# Patient Record
Sex: Female | Born: 1945
Health system: Southern US, Community
[De-identification: ages and names within clinical notes are randomized; demographics above are authoritative.]

## PROBLEM LIST (undated history)

## (undated) DIAGNOSIS — E069 Thyroiditis, unspecified: Secondary | ICD-10-CM

## (undated) DIAGNOSIS — E119 Type 2 diabetes mellitus without complications: Secondary | ICD-10-CM

## (undated) DIAGNOSIS — I10 Essential (primary) hypertension: Secondary | ICD-10-CM

## (undated) DIAGNOSIS — M1990 Unspecified osteoarthritis, unspecified site: Secondary | ICD-10-CM

## (undated) DIAGNOSIS — M109 Gout, unspecified: Secondary | ICD-10-CM

## (undated) DIAGNOSIS — I77810 Thoracic aortic ectasia: Secondary | ICD-10-CM

## (undated) DIAGNOSIS — K219 Gastro-esophageal reflux disease without esophagitis: Secondary | ICD-10-CM

## (undated) DIAGNOSIS — R011 Cardiac murmur, unspecified: Secondary | ICD-10-CM

## (undated) DIAGNOSIS — I351 Nonrheumatic aortic (valve) insufficiency: Secondary | ICD-10-CM

## (undated) DIAGNOSIS — I429 Cardiomyopathy, unspecified: Secondary | ICD-10-CM

## (undated) DIAGNOSIS — D126 Benign neoplasm of colon, unspecified: Secondary | ICD-10-CM

## (undated) DIAGNOSIS — I5032 Chronic diastolic (congestive) heart failure: Secondary | ICD-10-CM

## (undated) DIAGNOSIS — I2510 Atherosclerotic heart disease of native coronary artery without angina pectoris: Secondary | ICD-10-CM

## (undated) DIAGNOSIS — I6529 Occlusion and stenosis of unspecified carotid artery: Secondary | ICD-10-CM

## (undated) DIAGNOSIS — E7800 Pure hypercholesterolemia, unspecified: Secondary | ICD-10-CM

## (undated) DIAGNOSIS — Z951 Presence of aortocoronary bypass graft: Secondary | ICD-10-CM

## (undated) DIAGNOSIS — Z953 Presence of xenogenic heart valve: Secondary | ICD-10-CM

## (undated) HISTORY — DX: Thyroiditis, unspecified: E06.9

## (undated) HISTORY — DX: Pure hypercholesterolemia, unspecified: E78.00

## (undated) HISTORY — PX: CARDIAC VALVE REPLACEMENT: SHX585

## (undated) HISTORY — DX: Benign neoplasm of colon, unspecified: D12.6

## (undated) HISTORY — DX: Atherosclerotic heart disease of native coronary artery without angina pectoris: I25.10

## (undated) HISTORY — PX: COLONOSCOPY: SHX174

## (undated) HISTORY — PX: EYE SURGERY: SHX253

## (undated) HISTORY — DX: Essential (primary) hypertension: I10

## (undated) HISTORY — PX: CATARACT EXTRACTION: SUR2

## (undated) HISTORY — PX: CARPAL TUNNEL RELEASE: SHX101

## (undated) HISTORY — PX: ABDOMINAL HYSTERECTOMY: SHX81

## (undated) HISTORY — DX: Cardiomyopathy, unspecified: I42.9

## (undated) HISTORY — DX: Nonrheumatic aortic (valve) insufficiency: I35.1

## (undated) HISTORY — DX: Occlusion and stenosis of unspecified carotid artery: I65.29

## (undated) HISTORY — DX: Thoracic aortic ectasia: I77.810

## (undated) HISTORY — DX: Type 2 diabetes mellitus without complications: E11.9

## (undated) HISTORY — PX: CARDIAC CATHETERIZATION: SHX172

## (undated) HISTORY — PX: JOINT REPLACEMENT: SHX530

## (undated) HISTORY — DX: Chronic diastolic (congestive) heart failure: I50.32

## (undated) HISTORY — PX: BUNIONECTOMY: SHX129

## (undated) HISTORY — DX: Gout, unspecified: M10.9

---

## 1999-07-22 ENCOUNTER — Encounter: Admission: RE | Admit: 1999-07-22 | Discharge: 1999-07-22 | Payer: Self-pay | Admitting: Internal Medicine

## 1999-07-22 ENCOUNTER — Encounter: Payer: Self-pay | Admitting: Internal Medicine

## 1999-09-27 ENCOUNTER — Ambulatory Visit (HOSPITAL_COMMUNITY): Admission: RE | Admit: 1999-09-27 | Discharge: 1999-09-27 | Payer: Self-pay | Admitting: Gastroenterology

## 2000-03-13 ENCOUNTER — Encounter: Admission: RE | Admit: 2000-03-13 | Discharge: 2000-03-13 | Payer: Self-pay | Admitting: Internal Medicine

## 2000-03-13 ENCOUNTER — Encounter: Payer: Self-pay | Admitting: Internal Medicine

## 2000-09-22 ENCOUNTER — Encounter: Admission: RE | Admit: 2000-09-22 | Discharge: 2000-09-22 | Payer: Self-pay | Admitting: Internal Medicine

## 2000-09-22 ENCOUNTER — Encounter: Payer: Self-pay | Admitting: Internal Medicine

## 2000-09-24 ENCOUNTER — Encounter: Admission: RE | Admit: 2000-09-24 | Discharge: 2000-09-24 | Payer: Self-pay | Admitting: Internal Medicine

## 2000-09-24 ENCOUNTER — Encounter: Payer: Self-pay | Admitting: Internal Medicine

## 2001-12-29 ENCOUNTER — Encounter: Payer: Self-pay | Admitting: Internal Medicine

## 2001-12-29 ENCOUNTER — Encounter: Admission: RE | Admit: 2001-12-29 | Discharge: 2001-12-29 | Payer: Self-pay | Admitting: Internal Medicine

## 2003-01-26 ENCOUNTER — Encounter: Payer: Self-pay | Admitting: Internal Medicine

## 2003-01-26 ENCOUNTER — Encounter: Admission: RE | Admit: 2003-01-26 | Discharge: 2003-01-26 | Payer: Self-pay | Admitting: Internal Medicine

## 2003-07-20 ENCOUNTER — Encounter: Admission: RE | Admit: 2003-07-20 | Discharge: 2003-10-18 | Payer: Self-pay | Admitting: Internal Medicine

## 2004-02-13 ENCOUNTER — Encounter: Admission: RE | Admit: 2004-02-13 | Discharge: 2004-02-13 | Payer: Self-pay | Admitting: Geriatric Medicine

## 2004-02-22 ENCOUNTER — Observation Stay (HOSPITAL_COMMUNITY): Admission: EM | Admit: 2004-02-22 | Discharge: 2004-02-23 | Payer: Self-pay | Admitting: Emergency Medicine

## 2004-02-22 ENCOUNTER — Encounter (INDEPENDENT_AMBULATORY_CARE_PROVIDER_SITE_OTHER): Payer: Self-pay | Admitting: Specialist

## 2005-04-15 ENCOUNTER — Encounter: Admission: RE | Admit: 2005-04-15 | Discharge: 2005-04-15 | Payer: Self-pay | Admitting: Geriatric Medicine

## 2006-05-04 ENCOUNTER — Encounter: Admission: RE | Admit: 2006-05-04 | Discharge: 2006-05-04 | Payer: Self-pay | Admitting: Geriatric Medicine

## 2006-05-15 ENCOUNTER — Encounter: Admission: RE | Admit: 2006-05-15 | Discharge: 2006-05-15 | Payer: Self-pay | Admitting: Geriatric Medicine

## 2007-06-25 ENCOUNTER — Encounter: Admission: RE | Admit: 2007-06-25 | Discharge: 2007-06-25 | Payer: Self-pay | Admitting: Geriatric Medicine

## 2008-07-19 ENCOUNTER — Encounter: Admission: RE | Admit: 2008-07-19 | Discharge: 2008-07-19 | Payer: Self-pay | Admitting: Geriatric Medicine

## 2009-07-20 ENCOUNTER — Encounter: Admission: RE | Admit: 2009-07-20 | Discharge: 2009-07-20 | Payer: Self-pay | Admitting: Geriatric Medicine

## 2009-10-05 DIAGNOSIS — E069 Thyroiditis, unspecified: Secondary | ICD-10-CM

## 2009-10-05 HISTORY — DX: Thyroiditis, unspecified: E06.9

## 2009-10-31 ENCOUNTER — Encounter: Admission: RE | Admit: 2009-10-31 | Discharge: 2009-10-31 | Payer: Self-pay | Admitting: Geriatric Medicine

## 2009-12-31 HISTORY — PX: OTHER SURGICAL HISTORY: SHX169

## 2010-04-28 ENCOUNTER — Encounter: Payer: Self-pay | Admitting: Geriatric Medicine

## 2010-08-23 NOTE — Op Note (Signed)
[name] [name], PANNING NO.:  [id number]   MEDICAL RECORD NO.:  [id number]          PATIENT TYPE:  INP   LOCATION:  0107                         FACILITY:  [facility]   PHYSICIAN:  Currie Paris, M.D.DATE OF BIRTH:  02-28-1946   DATE OF PROCEDURE:  DATE OF DISCHARGE:                                 OPERATIVE REPORT   CHIEF COMPLAINT:  Groin pain.   HISTORY OF PRESENT ILLNESS:  [name] [name] has been in generally good health.  About 2-3 days ago, developed some discomfort in her right groin area.  She  first noticed it when she was leaning up against her dryer and had some  discomfort in the right groin area.  It had gotten worse over the last two  days.  She went to the doctor today because of it.  She has noticed that she  has developed a bulge in the area that has been exquisitely tender.  She has  never had this before.  She has never had a hernia before.  She had no  nausea or vomiting with this.  No fevers or chills.  No diarrhea or change  in her bowel habits.  She has never had a bulge in this area before.   PAST SURGICAL HISTORY:  She has had a hysterectomy as well as carpal tunnel.   MEDICATIONS:  Include colchicine and Metformin as well as Zocor.   ALLERGIES:  She has a questionable allergy to PENICILLIN from many years  ago.  Also an allergy to [facility].   SOCIAL HISTORY:  She neither smokes or drinks.   FAMILY HISTORY:  Unremarkable.   REVIEW OF SYSTEMS:  HEENT:  Negative.  CHEST:  No cough or shortness of  breath.  HEART:  No history of cardiac disease.  ABDOMEN:  Negative except  for HPI.  GU:  Negative.  ENDOCRINE:  She does have diabetes managed with  oral meds.   PHYSICAL EXAMINATION:  VITAL SIGNS:  Temp 100.8, pulse 110, blood pressure  121/71.  GENERAL:  The patient is alert and oriented and does not appear  unremarkable.  HEENT:  Head is normocephalic.  Eyes are nonicteric.  Pupils are equal,  round and regular.  EOMs are intact.  Pharynx  is normal.  Mucous membranes  are not dry.  NECK:  Supple.  No masses or thyromegaly.  LUNGS:  Normal respirations.  Clear to auscultation.  HEART:  Regular rhythm.  No murmurs, rubs or gallops.  Intact carotid,  femoral, and dorsalis pedis pulses.  ABDOMEN:  A well-healed Pfannenstiel scar.  It is soft and not distended.  It is not tender.  Bowel sounds are normal.  In the right groin area, above  the inguinal crease, is a fusiform, very tender mass consistent with an  incarcerated hernia.  It does not reduce.  I cannot be 100% certain that  this is not acute lymphadenitis, but its fusiform nature is more suggestive  of incarcerated hernia.  EXTREMITIES:  Good range of motion.  No edema noted.   IMPRESSION:  1.  Groin mass, probable incarcerated hernia.  2.  Diabetes.  3.  Gout.   PLAN:  Patient has been n.p.o., and I think we ought to go ahead with a  groin exploration with the idea that this is most likely an incarcerated  hernia.  I am concerned because her white count is 16,000 and a low-grade  fever.  I have discussed that with the patient.  She understands this may  not be a hernia and may be something else but at this point, exploration  seems to be the appropriate next step.  All questions have been answered.     Chri   CJS/MEDQ  D:  02/22/2004  T:  02/22/2004  Job:  [id number]

## 2010-08-23 NOTE — Op Note (Signed)
[name] [name], ION NO.:  [id number]   MEDICAL RECORD NO.:  [id number]          PATIENT TYPE:  INP   LOCATION:  0342                         FACILITY:  [facility]   PHYSICIAN:  Currie Paris, M.D.DATE OF BIRTH:  07/03/1945   DATE OF PROCEDURE:  02/22/2004  DATE OF DISCHARGE:  02/23/2004                                 OPERATIVE REPORT   PREOPERATIVE DIAGNOSES:  Incarcerated right inguinal hernia.   POSTOPERATIVE DIAGNOSES:  Right inguinal lymphadenitis with necrosis and  abscess.   OPERATION:  Right inguinal/groin exploration with removal of apparent  necrotic abscess lymph nodes.   SURGEON:  Currie Paris, M.D.   ANESTHESIA:  General.   HISTORY:  This patient is a 65 year old whose presented with a tender right  inguinal mass which was most consistent on physical with a hernia although  she had no other GI symptoms. She had an elevated white count and low grade  fever.  We elected to proceed to inguinal exploration.   DESCRIPTION OF PROCEDURE:  The patient was seen in the holding area and had  no further questions. She was taken to the operating room and after  satisfactory general anesthesia had been obtained, the abdomen and groin  were prepped and draped as a single sterile field. The mass itself was  visible and was running obliquely and parallel to the inguinal fold crease  of the leg but just above it.  I injected Marcaine over the area in the skin  as well as some subfascially at the anterior superior iliac spine to help  with the postoperative pain relief. A skin incision was made directly over  this fusiform swelling and there was edematous tissue noted.  I opened  Scarpa's and identified the external oblique aponeurosis which was cleaned  off. This was clearly presenting not out the superficial ring but inferior  and I initially thought this represented an incarcerated femoral hernia. As  I was dissecting around this fairly long piece  of formed area, it appeared  to be mainly fatty tissue and I encountered an abscess with creamy pus which  was cultured aerobically and anaerobically and sent for Gram stains as well.  I continued mobilizing this area up and there were several small vessels  which were clamped and tied but his was all in the same layer and seemed to  come up off of the femoral canal area and was separate from this.  As I  worked on it and began freeing it with the cautery and dividing around it,  it was a fairly well circumscribed area of inflammatory tissue which I  thought represented at least a couple of necrotic lymph nodes.  I went ahead  and excised all of this tissue so that we were left with nothing but clean  tissue.  I inspected the femoral canal area and there did not appear to be  any hernias. Everything at this point appeared to be dry.  I injected some  more Marcaine prior to closing.   Since we did have some frank infection, I elected not to close the subcu  but  simply close the skin with some staples with the plans to keep her on  postoperative antibiotics.   The patient tolerated the procedure well. There were no operative  complications.  All counts were correct.     Chri   CJS/MEDQ  D:  02/22/2004  T:  02/23/2004  Job:  [id number]   cc:   Hal T. Stoneking, M.D.  [address] [place], [place] [address]  Fax: [phone]

## 2010-08-27 ENCOUNTER — Other Ambulatory Visit: Payer: Self-pay | Admitting: Geriatric Medicine

## 2010-08-27 DIAGNOSIS — Z1231 Encounter for screening mammogram for malignant neoplasm of breast: Secondary | ICD-10-CM

## 2010-09-06 ENCOUNTER — Ambulatory Visit: Payer: Self-pay

## 2010-09-10 ENCOUNTER — Ambulatory Visit
Admission: RE | Admit: 2010-09-10 | Discharge: 2010-09-10 | Disposition: A | Discharge status: Home / Self Care | Payer: Medicare Other | Source: Ambulatory Visit | Attending: Geriatric Medicine | Admitting: Geriatric Medicine

## 2010-09-10 DIAGNOSIS — Z1231 Encounter for screening mammogram for malignant neoplasm of breast: Secondary | ICD-10-CM

## 2011-09-15 ENCOUNTER — Other Ambulatory Visit: Payer: Self-pay | Admitting: Geriatric Medicine

## 2011-09-15 DIAGNOSIS — Z1231 Encounter for screening mammogram for malignant neoplasm of breast: Secondary | ICD-10-CM

## 2011-10-01 ENCOUNTER — Ambulatory Visit
Admission: RE | Admit: 2011-10-01 | Discharge: 2011-10-01 | Disposition: A | Discharge status: Home / Self Care | Payer: Medicare Other | Source: Ambulatory Visit | Attending: Geriatric Medicine | Admitting: Geriatric Medicine

## 2011-10-01 DIAGNOSIS — Z1231 Encounter for screening mammogram for malignant neoplasm of breast: Secondary | ICD-10-CM

## 2012-11-05 ENCOUNTER — Other Ambulatory Visit: Payer: Self-pay

## 2012-11-05 DIAGNOSIS — Z1231 Encounter for screening mammogram for malignant neoplasm of breast: Secondary | ICD-10-CM

## 2012-11-24 ENCOUNTER — Ambulatory Visit
Admission: RE | Admit: 2012-11-24 | Discharge: 2012-11-24 | Disposition: A | Discharge status: Home / Self Care | Payer: 59 | Source: Ambulatory Visit

## 2012-11-24 DIAGNOSIS — Z1231 Encounter for screening mammogram for malignant neoplasm of breast: Secondary | ICD-10-CM

## 2013-03-30 ENCOUNTER — Encounter: Payer: Self-pay | Admitting: General Surgery

## 2013-03-30 DIAGNOSIS — I491 Atrial premature depolarization: Secondary | ICD-10-CM

## 2013-03-30 DIAGNOSIS — I351 Nonrheumatic aortic (valve) insufficiency: Secondary | ICD-10-CM

## 2013-03-30 DIAGNOSIS — I493 Ventricular premature depolarization: Secondary | ICD-10-CM

## 2013-03-30 DIAGNOSIS — I10 Essential (primary) hypertension: Secondary | ICD-10-CM

## 2013-04-13 ENCOUNTER — Encounter (INDEPENDENT_AMBULATORY_CARE_PROVIDER_SITE_OTHER): Payer: Self-pay

## 2013-04-13 ENCOUNTER — Encounter: Payer: Self-pay | Admitting: Cardiology

## 2013-04-13 ENCOUNTER — Ambulatory Visit (INDEPENDENT_AMBULATORY_CARE_PROVIDER_SITE_OTHER): Payer: Medicare Other | Admitting: Cardiology

## 2013-04-13 VITALS — BP 136/68 | HR 69 | Ht 61.0 in | Wt 176.4 lb

## 2013-04-13 DIAGNOSIS — I10 Essential (primary) hypertension: Secondary | ICD-10-CM

## 2013-04-13 DIAGNOSIS — I493 Ventricular premature depolarization: Secondary | ICD-10-CM

## 2013-04-13 DIAGNOSIS — I359 Nonrheumatic aortic valve disorder, unspecified: Secondary | ICD-10-CM

## 2013-04-13 DIAGNOSIS — I77810 Thoracic aortic ectasia: Secondary | ICD-10-CM

## 2013-04-13 DIAGNOSIS — I4949 Other premature depolarization: Secondary | ICD-10-CM

## 2013-04-13 DIAGNOSIS — I351 Nonrheumatic aortic (valve) insufficiency: Secondary | ICD-10-CM

## 2013-04-13 NOTE — Progress Notes (Signed)
[address], [place], [place]  [address] Phone: [phone] Fax:  [phone]  Date:  04/13/2013   ID:  Hetal, Proano 1945/05/28, MRN [id number]  PCP:  Mathews Argyle, MD  Cardiologist:  Fransico Him, MD     History of Present Illness: [name] [name] is a 68 y.o. female with a history of PVC's, dilated aortic root, moderate AR, HTN who presents today for followup.  She is doing well.  She denies any chest pain, LE edema, dizziness,  or syncope.  She walks about 50 minutes 3 times weekly and sometimes gets a little SOB but this is stable  She occasionally notices a skipped heart beat.     Wt Readings from Last 3 Encounters:  04/13/13 176 lb 6.4 oz (80.015 kg)  03/30/13 173 lb (78.472 kg)     Past Medical History  Diagnosis Date  . Gout   . Diabetes mellitus without complication   . Hypertension   . Hypercholesteremia     LDL goal < 100  . Thyroiditis 10/2009    lab and u/s-thyroid function normalized in 9/11  . History of echocardiogram 2014    Moderate AR, mild MR, grade II diastolic dysfunction,mildly dilated aorta   . Adenomatous colon polyp   . Aortic regurgitation   . Dilated aortic root     Current Outpatient Prescriptions  Medication Sig Dispense Refill  . aspirin 81 MG tablet Take 81 mg by mouth daily.      . Calcium Citrate-Vitamin D 315-250 MG-UNIT TABS Take 1 tablet by mouth daily.      . colchicine 0.6 MG tablet Take 0.6 mg by mouth daily.      Marland Kitchen diltiazem (DILACOR XR) 240 MG 24 hr capsule Take 240 mg by mouth daily.      . Glucosamine 500 MG CAPS Take 1 capsule by mouth daily.      . metFORMIN (GLUCOPHAGE) 500 MG tablet Take 500 mg by mouth daily with breakfast.      . Multiple Vitamin (MULTIVITAMIN) tablet Take 1 tablet by mouth daily.       No current facility-administered medications for this visit.    Allergies:    Allergies  Allergen Reactions  . Atenolol Shortness Of Breath    Edema and Headache  . Lipitor  [Atorvastatin]     Fatigue  . Penicillins     G Benzathine: Local injection rash  . Sulfa Antibiotics Rash    Social History:  The patient  reports that she has never smoked. She does not have any smokeless tobacco history on file. She reports that she does not drink alcohol or use illicit drugs.   Family History:  The patient's family history is not on file.   ROS:  Please see the history of present illness.      All other systems reviewed and negative.   PHYSICAL EXAM: VS:  BP 136/68  Pulse 69  Ht 5\' 1"  (1.549 m)  Wt 176 lb 6.4 oz (80.015 kg)  BMI 33.35 kg/m2 Well nourished, well developed, in no acute distress HEENT: normal Neck: no JVD Cardiac:  normal S1, S2; RRR; no murmur Lungs:  clear to auscultation bilaterally, no wheezing, rhonchi or rales Abd: soft, nontender, no hepatomegaly Ext: no edema Skin: warm and dry Neuro:  CNs 2-12 intact, no focal abnormalities noted  EKG:  NSR with LVH with QRS widening and PVC's and nonspecific T wave abnormality     ASSESSMENT AND PLAN:  1. PVC's  - continue diltiazem 2. HTN - well controlled  - continue Diltiazem 3. Dilated aortic root - she has repeat echo scheduled for 09/2013 4. Moderate AR  Followup with me in 6 months  Signed, Fransico Him, MD 04/13/2013 9:00 AM

## 2013-04-13 NOTE — Patient Instructions (Signed)
Your physician recommends that you continue on your current medications as directed. Please refer to the Current Medication list given to you today.  Your physician wants you to follow-up in: 6 Months with Dr Turner You will receive a reminder letter in the mail two months in advance. If you don't receive a letter, please call our office to schedule the follow-up appointment.  

## 2013-09-21 ENCOUNTER — Telehealth: Payer: Self-pay | Admitting: General Surgery

## 2013-09-21 ENCOUNTER — Ambulatory Visit
Admission: RE | Admit: 2013-09-21 | Discharge: 2013-09-21 | Disposition: A | Discharge status: Home / Self Care | Payer: Medicare Other | Source: Ambulatory Visit | Attending: Internal Medicine | Admitting: Internal Medicine

## 2013-09-21 ENCOUNTER — Other Ambulatory Visit: Payer: Self-pay | Admitting: Internal Medicine

## 2013-09-21 DIAGNOSIS — R059 Cough, unspecified: Secondary | ICD-10-CM

## 2013-09-21 DIAGNOSIS — R05 Cough: Secondary | ICD-10-CM

## 2013-09-21 NOTE — Telephone Encounter (Signed)
Pt was at Dr Frutoso Chase office at [place]. Pt complained of fatigue, irregular heart rate with PVCs, and SOB w/Exertion. No substancial weight gain per Bethena Roys. Pt had EKG done in Office and Dr Amedeo Kinsman thinks we should look at it.   BP 148/68 P 84   They will fax over EKG for Dr Radford Pax to review  Curt Bears return number is [phone]  Since Dr Radford Pax is not in office I will show EKG to DOD Dr Ron Parker to see if he thinks we need to do anything immediately with pt or if it can wait for Dr Landis Gandy review when she is in the office later this afternoon

## 2013-09-21 NOTE — Telephone Encounter (Signed)
Dr Ron Parker said it was ok to wait on Dr Radford Pax to look over when she arrives after reviewing EKG. Will forward to Dr Radford Pax and Pt EKG on cart for review.

## 2013-09-23 ENCOUNTER — Telehealth: Payer: Self-pay | Admitting: Cardiology

## 2013-09-23 NOTE — Telephone Encounter (Signed)
If she is having a lot of palpitations then we can get a 24 hour holter to assess PVC load

## 2013-09-23 NOTE — Telephone Encounter (Signed)
Asked to look at EKG from PCP office due to patient complaining of palpitations.  EKG shows NSR with occasional PVC with LVH with repolarization and old anterior MI.  Please forward to PCP

## 2013-09-23 NOTE — Telephone Encounter (Signed)
Notified of EKG results from Dr. Cathlean Sauer office.  She states she has very occ palpitations. One day last week her HR was 90 and that was why she went to see Dr. Felipa Eth.  She also states that she has been waking up during night sweating, hair wet.  She is scheduled for Echo next Tues 6/24.  Sent Dr. Felipa Eth EKG interruption  per Dr. Radford Pax. Will forward back to Dr. Radford Pax for comment as to her wearing monitor since palpitations are occasional.

## 2013-09-25 NOTE — Telephone Encounter (Signed)
No monitor needed at this time

## 2013-09-26 NOTE — Telephone Encounter (Signed)
lmtrc

## 2013-09-26 NOTE — Telephone Encounter (Signed)
Pt is aware.  

## 2013-09-27 ENCOUNTER — Other Ambulatory Visit: Payer: Self-pay | Admitting: General Surgery

## 2013-09-27 DIAGNOSIS — I77810 Thoracic aortic ectasia: Secondary | ICD-10-CM

## 2013-09-28 ENCOUNTER — Ambulatory Visit (HOSPITAL_COMMUNITY): Payer: Medicare Other | Attending: Cardiology | Admitting: Cardiology

## 2013-09-28 ENCOUNTER — Encounter: Payer: Self-pay | Admitting: *Deleted

## 2013-09-28 DIAGNOSIS — I77810 Thoracic aortic ectasia: Secondary | ICD-10-CM

## 2013-09-28 DIAGNOSIS — I359 Nonrheumatic aortic valve disorder, unspecified: Secondary | ICD-10-CM | POA: Insufficient documentation

## 2013-09-28 DIAGNOSIS — I351 Nonrheumatic aortic (valve) insufficiency: Secondary | ICD-10-CM

## 2013-09-28 DIAGNOSIS — I059 Rheumatic mitral valve disease, unspecified: Secondary | ICD-10-CM | POA: Insufficient documentation

## 2013-09-28 NOTE — Progress Notes (Signed)
Echo performed. 

## 2013-09-30 ENCOUNTER — Other Ambulatory Visit (INDEPENDENT_AMBULATORY_CARE_PROVIDER_SITE_OTHER): Payer: Medicare Other

## 2013-09-30 ENCOUNTER — Encounter: Payer: Self-pay | Admitting: General Surgery

## 2013-09-30 ENCOUNTER — Telehealth: Payer: Self-pay | Admitting: Cardiology

## 2013-09-30 ENCOUNTER — Other Ambulatory Visit: Payer: Self-pay | Admitting: General Surgery

## 2013-09-30 ENCOUNTER — Encounter (HOSPITAL_COMMUNITY): Payer: Self-pay | Admitting: Pharmacy Technician

## 2013-09-30 DIAGNOSIS — I359 Nonrheumatic aortic valve disorder, unspecified: Secondary | ICD-10-CM

## 2013-09-30 DIAGNOSIS — R931 Abnormal findings on diagnostic imaging of heart and coronary circulation: Secondary | ICD-10-CM

## 2013-09-30 DIAGNOSIS — R9389 Abnormal findings on diagnostic imaging of other specified body structures: Secondary | ICD-10-CM

## 2013-09-30 LAB — PROTIME-INR
INR: 1.2 ratio — ABNORMAL HIGH (ref 0.8–1.0)
Prothrombin Time: 12.8 s (ref 9.6–13.1)

## 2013-09-30 LAB — BASIC METABOLIC PANEL
BUN: 18 mg/dL (ref 6–23)
CO2: 28 mEq/L (ref 19–32)
Calcium: 9.6 mg/dL (ref 8.4–10.5)
Chloride: 104 mEq/L (ref 96–112)
Creatinine, Ser: 0.8 mg/dL (ref 0.4–1.2)
GFR: 78.68 mL/min (ref >60.00)
Glucose, Bld: 153 mg/dL — ABNORMAL HIGH (ref 70–99)
Potassium: 4.7 mEq/L (ref 3.5–5.1)
Sodium: 139 mEq/L (ref 135–145)

## 2013-09-30 LAB — CBC WITH DIFFERENTIAL/PLATELET
Basophils Absolute: 0 10*3/uL (ref 0.0–0.1)
Basophils Relative: 0.5 % (ref 0.0–3.0)
Eosinophils Absolute: 0.2 10*3/uL (ref 0.0–0.7)
Eosinophils Relative: 3.1 % (ref 0.0–5.0)
HCT: 41.3 % (ref 36.0–46.0)
Hemoglobin: 14.3 g/dL (ref 12.0–15.0)
Lymphocytes Relative: 43.6 % (ref 12.0–46.0)
Lymphs Abs: 3.3 10*3/uL (ref 0.7–4.0)
MCHC: 34.6 g/dL (ref 30.0–36.0)
MCV: 98.3 fl (ref 78.0–100.0)
Monocytes Absolute: 0.6 10*3/uL (ref 0.1–1.0)
Monocytes Relative: 8.2 % (ref 3.0–12.0)
Neutro Abs: 3.4 10*3/uL (ref 1.4–7.7)
Neutrophils Relative %: 44.6 % (ref 43.0–77.0)
Platelets: 172 10*3/uL (ref 150.0–400.0)
RBC: 4.2 Mil/uL (ref 3.87–5.11)
RDW: 12.6 % (ref 11.5–15.5)
WBC: 7.7 10*3/uL (ref 4.0–10.5)

## 2013-09-30 NOTE — Telephone Encounter (Signed)
To Dr Turner to advise 

## 2013-09-30 NOTE — Telephone Encounter (Signed)
New Message  Pt states that she walks every Sunday and wants to know if she should stop. Please assist

## 2013-09-30 NOTE — Telephone Encounter (Signed)
Ok to continue to walk

## 2013-10-03 ENCOUNTER — Encounter: Payer: Self-pay | Admitting: General Surgery

## 2013-10-03 NOTE — Telephone Encounter (Signed)
Pt is aware.  

## 2013-10-04 ENCOUNTER — Encounter (HOSPITAL_COMMUNITY): Payer: Self-pay

## 2013-10-04 ENCOUNTER — Ambulatory Visit (HOSPITAL_COMMUNITY)
Admission: RE | Admit: 2013-10-04 | Discharge: 2013-10-04 | Disposition: A | Discharge status: Home / Self Care | Payer: Medicare Other | Source: Ambulatory Visit | Attending: Cardiology | Admitting: Cardiology

## 2013-10-04 ENCOUNTER — Telehealth: Payer: Self-pay | Admitting: Cardiology

## 2013-10-04 ENCOUNTER — Encounter: Payer: Self-pay | Admitting: General Surgery

## 2013-10-04 ENCOUNTER — Other Ambulatory Visit: Payer: Self-pay | Admitting: Cardiology

## 2013-10-04 ENCOUNTER — Encounter (HOSPITAL_COMMUNITY)
Admission: RE | Disposition: A | Discharge status: Home / Self Care | Payer: Self-pay | Source: Ambulatory Visit | Attending: Cardiology

## 2013-10-04 DIAGNOSIS — I10 Essential (primary) hypertension: Secondary | ICD-10-CM | POA: Insufficient documentation

## 2013-10-04 DIAGNOSIS — I351 Nonrheumatic aortic (valve) insufficiency: Secondary | ICD-10-CM

## 2013-10-04 DIAGNOSIS — I359 Nonrheumatic aortic valve disorder, unspecified: Secondary | ICD-10-CM

## 2013-10-04 DIAGNOSIS — I379 Nonrheumatic pulmonary valve disorder, unspecified: Secondary | ICD-10-CM | POA: Insufficient documentation

## 2013-10-04 DIAGNOSIS — I079 Rheumatic tricuspid valve disease, unspecified: Secondary | ICD-10-CM | POA: Insufficient documentation

## 2013-10-04 DIAGNOSIS — M109 Gout, unspecified: Secondary | ICD-10-CM | POA: Insufficient documentation

## 2013-10-04 DIAGNOSIS — I77810 Thoracic aortic ectasia: Secondary | ICD-10-CM | POA: Insufficient documentation

## 2013-10-04 DIAGNOSIS — E7800 Pure hypercholesterolemia, unspecified: Secondary | ICD-10-CM | POA: Insufficient documentation

## 2013-10-04 DIAGNOSIS — Z7982 Long term (current) use of aspirin: Secondary | ICD-10-CM | POA: Insufficient documentation

## 2013-10-04 DIAGNOSIS — Z860100 Personal history of colon polyps, unspecified: Secondary | ICD-10-CM | POA: Insufficient documentation

## 2013-10-04 DIAGNOSIS — E119 Type 2 diabetes mellitus without complications: Secondary | ICD-10-CM | POA: Insufficient documentation

## 2013-10-04 DIAGNOSIS — I4949 Other premature depolarization: Secondary | ICD-10-CM | POA: Insufficient documentation

## 2013-10-04 DIAGNOSIS — I080 Rheumatic disorders of both mitral and aortic valves: Secondary | ICD-10-CM | POA: Insufficient documentation

## 2013-10-04 HISTORY — PX: TEE WITHOUT CARDIOVERSION: SHX5443

## 2013-10-04 LAB — GLUCOSE, CAPILLARY: Glucose-Capillary: 120 mg/dL — ABNORMAL HIGH (ref 70–99)

## 2013-10-04 SURGERY — ECHOCARDIOGRAM, TRANSESOPHAGEAL
Anesthesia: Moderate Sedation

## 2013-10-04 MED ORDER — CARVEDILOL 6.25 MG PO TABS: 6.2500 mg | ORAL_TABLET | Freq: Two times a day (BID) | ORAL | Status: DC

## 2013-10-04 MED ORDER — LIDOCAINE VISCOUS 2 % MT SOLN
OROMUCOSAL | Status: DC | PRN
  Administered 2013-10-04: 10 mL via OROMUCOSAL

## 2013-10-04 MED ORDER — LIDOCAINE VISCOUS 2 % MT SOLN
OROMUCOSAL | Status: AC
  Filled 2013-10-04: qty 15

## 2013-10-04 MED ORDER — MIDAZOLAM HCL 10 MG/2ML IJ SOLN
INTRAMUSCULAR | Status: DC | PRN
  Administered 2013-10-04: 1 mg via INTRAVENOUS
  Administered 2013-10-04: 2 mg via INTRAVENOUS

## 2013-10-04 MED ORDER — FENTANYL CITRATE 0.05 MG/ML IJ SOLN
INTRAMUSCULAR | Status: DC | PRN
  Administered 2013-10-04 (×2): 12.5 ug via INTRAVENOUS
  Administered 2013-10-04: 25 ug via INTRAVENOUS

## 2013-10-04 MED ORDER — RAMIPRIL 5 MG PO CAPS: 5.0000 mg | ORAL_CAPSULE | Freq: Every day | ORAL | Status: DC

## 2013-10-04 MED ORDER — SODIUM CHLORIDE 0.9 % IV SOLN
INTRAVENOUS | Status: DC
  Administered 2013-10-04: 250 mL via INTRAVENOUS

## 2013-10-04 MED ORDER — FENTANYL CITRATE 0.05 MG/ML IJ SOLN
INTRAMUSCULAR | Status: AC
  Filled 2013-10-04: qty 2

## 2013-10-04 MED ORDER — MIDAZOLAM HCL 5 MG/ML IJ SOLN
INTRAMUSCULAR | Status: AC
  Filled 2013-10-04: qty 1

## 2013-10-04 NOTE — CV Procedure (Addendum)
   PROCEDURE NOTE  Procedure:  Transesophageal echocardiogram Operator:  Fransico Him, MD Indications:  Aortic insufficiency Complications: None IV Meds: Versed 3mg , Fentanyl 23mcg IV, 5cc Viscous Lidocaine  Results: Moderately dilated LV with severely reduced LVF EF 25% Normal RV size and function Normal RA Mildly dilated LA Normal TV with trivial TR Normal MV with mild MR Normal PV with trivial PR Thickened AV leaflets with mild AS.  The AV leaflets do not completely coapt together and there is moderately severe to severe AR that is eccentric.   Normal interatrial septum with no evidence of flow by color flow doppler Dilated ascending aorta  The patient tolerated the procedure well with no complications and was transferred back to her room in stable condition.  Assessment: 1.  Moderate to severe AI with dilated ascending aorta 2.  Severe LV dysfunction most likely secondary to #1 as well as poorly controlled HTN 3.  HTN poorly controlled today 4.  Mild AS  Plan: 1.  Set up for right and left heart cath 2.  D/C Cardizem due to LV dysfunction 3.  Start Coreg 6.25mg  BID 4.  Start Ramipril 10mg  daily

## 2013-10-04 NOTE — Telephone Encounter (Signed)
Please set up a cardiac CT for heart morphology- I have already put in order.  She needs it done later this week.  Please set up patient for CVTS consult with Dr. Roxy Manns for next week - I have already talked to Dr. Roxy Manns

## 2013-10-04 NOTE — Telephone Encounter (Signed)
Made pt aware and went over pre instructions with pt.

## 2013-10-04 NOTE — Telephone Encounter (Signed)
Scheduled. For PT

## 2013-10-04 NOTE — Telephone Encounter (Signed)
Pt set up with Dr Copper tomorrow at 3:00 pm will call pt and make aware.

## 2013-10-04 NOTE — Telephone Encounter (Signed)
Patient had TEE showing severe LV dysfunction with moderate to severe AI.  Please set her up for right and left heart cath with Dr. Burt Knack

## 2013-10-04 NOTE — Discharge Instructions (Signed)
Conscious Sedation, Adult, Care After °Refer to this sheet in the next few weeks. These instructions provide you with information on caring for yourself after your procedure. Your health care provider may also give you more specific instructions. Your treatment has been planned according to current medical practices, but problems sometimes occur. Call your health care provider if you have any problems or questions after your procedure. °WHAT TO EXPECT AFTER THE PROCEDURE  °After your procedure: °· You may feel sleepy, clumsy, and have poor balance for several hours. °· Vomiting may occur if you eat too soon after the procedure. °HOME CARE INSTRUCTIONS °· Do not participate in any activities where you could become injured for at least 24 hours. Do not: °¨ Drive. °¨ Swim. °¨ Ride a bicycle. °¨ Operate heavy machinery. °¨ Cook. °¨ Use power tools. °¨ Climb ladders. °¨ Work from a high place. °· Do not make important decisions or sign legal documents until you are improved. °· If you vomit, drink water, juice, or soup when you can drink without vomiting. Make sure you have little or no nausea before eating solid foods. °· Only take over-the-counter or prescription medicines for pain, discomfort, or fever as directed by your health care provider. °· Make sure you and your family fully understand everything about the medicines given to you, including what side effects may occur. °· You should not drink alcohol, take sleeping pills, or take medicines that cause drowsiness for at least 24 hours. °· If you smoke, do not smoke without supervision. °· If you are feeling better, you may resume normal activities 24 hours after you were sedated. °· Keep all appointments with your health care provider. °SEEK MEDICAL CARE IF: °· Your skin is pale or bluish in color. °· You continue to feel nauseous or vomit. °· Your pain is getting worse and is not helped by medicine. °· You have bleeding or swelling. °· You are still sleepy or  feeling clumsy after 24 hours. °SEEK IMMEDIATE MEDICAL CARE IF: °· You develop a rash. °· You have difficulty breathing. °· You develop any type of allergic problem. °· You have a fever. °MAKE SURE YOU: °· Understand these instructions. °· Will watch your condition. °· Will get help right away if you are not doing well or get worse. °Document Released: 01/12/2013 Document Reviewed: 01/12/2013 °ExitCare® Patient Information ©2015 [employer]. This information is not intended to replace advice given to you by your health care provider. Make sure you discuss any questions you have with your health care provider. °Transesophageal Echocardiogram °Transesophageal echocardiography (TEE) is a picture test of your heart using sound waves. The pictures taken can give very detailed pictures of your heart. This can help your doctor see if there are problems with your heart. TEE can check: °· If your heart has blood clots in it. °· How well your heart valves are working. °· If you have an infection on the inside of your heart. °· Some of the major arteries of your heart. °· If your heart valve is working after a repair. °· Your heart before a procedure that uses a shock to your heart to get the rhythm back to normal. °BEFORE THE PROCEDURE °· Do not eat or drink for 6 hours before the procedure or as told by your doctor. °· Make plans to have someone drive you home after the procedure. Do not drive yourself home. °· An IV tube will be put in your arm. °PROCEDURE °· You will be given a   medicine to help you relax (sedative). It will be given through the IV tube. °· A numbing medicine will be sprayed or gargled in the back of your throat to help numb it. °· The tip of the probe is placed into the back of your mouth. You will be asked to swallow. This helps to pass the probe into your esophagus. °· Once the tip of the probe is in the right place, your doctor can take pictures of your heart. °· You may feel pressure at the back of  your throat. °AFTER THE PROCEDURE °· You will be taken to a recovery area so the sedative can wear off. °· Your throat may be sore and scratchy. This will go away slowly over time. °· You will go home when you are fully awake and able to swallow liquids. °· You should have someone stay with you for the next 24 hours. °· Do not drive or operate machinery for the next 24 hours. °Document Released: 01/19/2009 Document Revised: 03/29/2013 Document Reviewed: 09/23/2012 °Exit[employer]® Patient Information ©2015 [employer]. This information is not intended to replace advice given to you by your health care provider. Make sure you discuss any questions you have with your health care provider. ° °

## 2013-10-04 NOTE — Progress Notes (Signed)
Echocardiogram Echocardiogram Transesophageal has been performed.  Joelene Millin 10/04/2013, 11:07 AM

## 2013-10-04 NOTE — H&P (Signed)
Admit date: 10/04/2013 Primary Cardiologist:  Fransico Him, MD Chief complaint/reason for admission: Moderate AR with reduced LVF and mild AS and enlarged ascending aorta  HPI: [name] [name] is a 68 y.o. female with a history of PVC's, dilated aortic root, moderate AR, HTN who presents today for TEE. She is doing well. She denies any chest pain, LE edema, dizziness, or syncope. She walks about 50 minutes 3 times weekly and sometimes gets a little SOB but this is stable She occasionally notices a skipped heart beat. She recently had an echo done for followup of her AR and was found to have moderately reduced LVF with mild AS and moderate AR, mildly dilated ascending aorta. Since last echo her LVF has declined. She is now here for a TEE to evaluate her AR to determine if moderate or severe   PMH:    Past Medical History  Diagnosis Date  . Gout   . Diabetes mellitus without complication   . Hypertension   . Hypercholesteremia     LDL goal < 100  . Thyroiditis 10/2009    lab and u/s-thyroid function normalized in 9/11  . History of echocardiogram 2014    Moderate AR, mild MR, grade II diastolic dysfunction,mildly dilated aorta   . Adenomatous colon polyp   . Aortic regurgitation   . Dilated aortic root     PSH:    Past Surgical History  Procedure Laterality Date  . Tubular adenomatous polyp colonoscopy  12/31/2009    ALLERGIES:   Atenolol; Penicillins; and Sulfa antibiotics  Prior to Admit Meds:   Prescriptions prior to admission  Medication Sig Dispense Refill  . aspirin EC 81 MG tablet Take 81 mg by mouth daily.      . Calcium-Vitamin D (CALTRATE 600 PLUS-VIT D PO) Take 1 tablet by mouth daily.      . colchicine (COLCRYS) 0.6 MG tablet Take 0.6 mg by mouth daily.      Marland Kitchen diltiazem (CARDIZEM CD) 240 MG 24 hr capsule Take 240 mg by mouth daily.      Marland Kitchen GLUCOSAMINE-CHONDROITIN PO Take 1 tablet by mouth daily. Glucosamine 1200 mg, chondroitin 1500 mg      . indomethacin (INDOCIN) 25  MG capsule Take 25 mg by mouth daily as needed (knee pain).      . metFORMIN (GLUCOPHAGE-XR) 500 MG 24 hr tablet Take 500 mg by mouth daily with breakfast.      . Multiple Vitamin (MULTIVITAMIN WITH MINERALS) TABS tablet Take 1 tablet by mouth daily. Centrum Silver      . naproxen sodium (ALEVE) 220 MG tablet Take 440 mg by mouth daily as needed (knee pain).      . Polyvinyl Alcohol-Povidone (REFRESH OP) Place 1 drop into both eyes daily as needed (dry eyes).       Family HX:   History reviewed. No pertinent family history. Social HX:    History   Social History  . Marital Status: Married    Spouse Name: N/A    Number of Children: N/A  . Years of Education: N/A   Occupational History  . Not on file.   Social History Main Topics  . Smoking status: Never Smoker   . Smokeless tobacco: Not on file  . Alcohol Use: No  . Drug Use: No  . Sexual Activity: Not on file   Other Topics Concern  . Not on file   Social History Narrative  . No narrative on file     ROS:  All 11 ROS were addressed and are negative except what is stated in the HPI  PHYSICAL EXAM Filed Vitals:   10/04/13 0947  BP: 161/54  Pulse: 73  Temp: 98.4 F (36.9 C)  Resp: 20   General: Well developed, well nourished, in no acute distress Head: Eyes PERRLA, No xanthomas.   Normal cephalic and atramatic  Lungs:   Clear bilaterally to auscultation and percussion. Heart:   HRRR S1 S2 Pulses are 2+ & equal.            No carotid bruit. No JVD.  No abdominal bruits. No femoral bruits. Abdomen: Bowel sounds are positive, abdomen soft and non-tender without masses Extremities:   No clubbing, cyanosis or edema.  DP +1 Neuro: Alert and oriented X 3. Psych:  Good affect, responds appropriately   Labs:   Lab Results  Component Value Date   WBC 7.7 09/30/2013   HGB 14.3 09/30/2013   HCT 41.3 09/30/2013   MCV 98.3 09/30/2013   PLT 172.0 09/30/2013    Recent Labs Lab 09/30/13 1125  NA 139  K 4.7  CL 104  CO2  28  BUN 18  CREATININE 0.8  CALCIUM 9.6  GLUCOSE 153*   No results found for this basename: CKTOTAL, CKMB, CKMBINDEX, TROPONINI   No results found for this basename: PTT   Lab Results  Component Value Date   INR 1.2* 09/30/2013     No results found for this basename: CHOL   No results found for this basename: HDL   No results found for this basename: LDLCALC   No results found for this basename: TRIG   No results found for this basename: CHOLHDL   No results found for this basename: LDLDIRECT      Radiology:  No results found.   ASSESSMENT AND PLAN:  1. PVC's - continue diltiazem  2. HTN - well controlled - continue Diltiazem  3. Dilated aortic root by echo. 4. Moderate AR 5. Mild AS 6. Moderately reduced LVF possibly secondary to AR - TEE today to assess severity of AR   Sueanne Margarita, MD  10/04/2013  10:11 AM

## 2013-10-04 NOTE — Interval H&P Note (Signed)
History and Physical Interval Note:  10/04/2013 10:15 AM  [name] [name]  has presented today for surgery, with the diagnosis of abnormal echocardiogram  The various methods of treatment have been discussed with the patient and family. After consideration of risks, benefits and other options for treatment, the patient has consented to  Procedure(s): TRANSESOPHAGEAL ECHOCARDIOGRAM (TEE) (N/A) as a surgical intervention .  The patient's history has been reviewed, patient examined, no change in status, stable for surgery.  I have reviewed the patient's chart and labs.  Questions were answered to the patient's satisfaction.     [name] R

## 2013-10-05 ENCOUNTER — Telehealth: Payer: Self-pay | Admitting: Cardiology

## 2013-10-05 ENCOUNTER — Encounter: Payer: Self-pay | Admitting: General Surgery

## 2013-10-05 ENCOUNTER — Encounter (HOSPITAL_COMMUNITY): Payer: Self-pay | Admitting: Cardiology

## 2013-10-05 ENCOUNTER — Ambulatory Visit (HOSPITAL_COMMUNITY)
Admission: RE | Admit: 2013-10-05 | Discharge: 2013-10-05 | Disposition: A | Discharge status: Home / Self Care | Payer: Medicare Other | Source: Ambulatory Visit | Attending: Cardiovascular Disease | Admitting: Cardiovascular Disease

## 2013-10-05 ENCOUNTER — Encounter (HOSPITAL_COMMUNITY)
Admission: RE | Disposition: A | Discharge status: Home / Self Care | Payer: Self-pay | Source: Ambulatory Visit | Attending: Cardiovascular Disease

## 2013-10-05 ENCOUNTER — Other Ambulatory Visit: Payer: Self-pay | Admitting: General Surgery

## 2013-10-05 DIAGNOSIS — M109 Gout, unspecified: Secondary | ICD-10-CM | POA: Insufficient documentation

## 2013-10-05 DIAGNOSIS — I351 Nonrheumatic aortic (valve) insufficiency: Secondary | ICD-10-CM

## 2013-10-05 DIAGNOSIS — I428 Other cardiomyopathies: Secondary | ICD-10-CM | POA: Insufficient documentation

## 2013-10-05 DIAGNOSIS — I359 Nonrheumatic aortic valve disorder, unspecified: Secondary | ICD-10-CM | POA: Insufficient documentation

## 2013-10-05 DIAGNOSIS — Z860100 Personal history of colon polyps, unspecified: Secondary | ICD-10-CM | POA: Insufficient documentation

## 2013-10-05 DIAGNOSIS — I2584 Coronary atherosclerosis due to calcified coronary lesion: Secondary | ICD-10-CM | POA: Insufficient documentation

## 2013-10-05 DIAGNOSIS — I2510 Atherosclerotic heart disease of native coronary artery without angina pectoris: Secondary | ICD-10-CM

## 2013-10-05 DIAGNOSIS — I10 Essential (primary) hypertension: Secondary | ICD-10-CM | POA: Insufficient documentation

## 2013-10-05 DIAGNOSIS — E119 Type 2 diabetes mellitus without complications: Secondary | ICD-10-CM | POA: Insufficient documentation

## 2013-10-05 DIAGNOSIS — I4949 Other premature depolarization: Secondary | ICD-10-CM | POA: Insufficient documentation

## 2013-10-05 DIAGNOSIS — E7800 Pure hypercholesterolemia, unspecified: Secondary | ICD-10-CM | POA: Insufficient documentation

## 2013-10-05 DIAGNOSIS — Z7982 Long term (current) use of aspirin: Secondary | ICD-10-CM | POA: Insufficient documentation

## 2013-10-05 HISTORY — DX: Atherosclerotic heart disease of native coronary artery without angina pectoris: I25.10

## 2013-10-05 HISTORY — PX: LEFT AND RIGHT HEART CATHETERIZATION WITH CORONARY ANGIOGRAM: SHX5449

## 2013-10-05 LAB — POCT I-STAT 3, ART BLOOD GAS (G3+)
Acid-base deficit: 1 mmol/L (ref 0.0–2.0)
Acid-base deficit: 1 mmol/L (ref 0.0–2.0)
Bicarbonate: 23.5 mEq/L (ref 20.0–24.0)
Bicarbonate: 24.1 mEq/L — ABNORMAL HIGH (ref 20.0–24.0)
O2 Saturation: 70 %
O2 Saturation: 92 %
TCO2: 25 mmol/L (ref 0–100)
TCO2: 25 mmol/L (ref 0–100)
pCO2 arterial: 39.2 mmHg (ref 35.0–45.0)
pCO2 arterial: 39.5 mmHg (ref 35.0–45.0)
pH, Arterial: 7.382 (ref 7.350–7.450)
pH, Arterial: 7.397 (ref 7.350–7.450)
pO2, Arterial: 37 mmHg — CL (ref 80.0–100.0)
pO2, Arterial: 65 mmHg — ABNORMAL LOW (ref 80.0–100.0)

## 2013-10-05 LAB — GLUCOSE, CAPILLARY: Glucose-Capillary: 81 mg/dL (ref 70–99)

## 2013-10-05 SURGERY — LEFT AND RIGHT HEART CATHETERIZATION WITH CORONARY ANGIOGRAM
Anesthesia: LOCAL

## 2013-10-05 MED ORDER — SODIUM CHLORIDE 0.9 % IJ SOLN: 3.0000 mL | Freq: Two times a day (BID) | INTRAMUSCULAR | Status: DC

## 2013-10-05 MED ORDER — SODIUM CHLORIDE 0.9 % IV SOLN: 1.0000 mL/kg/h | INTRAVENOUS | Status: DC

## 2013-10-05 MED ORDER — ONDANSETRON HCL 4 MG/2ML IJ SOLN: 4.0000 mg | Freq: Four times a day (QID) | INTRAMUSCULAR | Status: DC | PRN

## 2013-10-05 MED ORDER — SODIUM CHLORIDE 0.9 % IV SOLN
INTRAVENOUS | Status: DC
  Administered 2013-10-05: 14:00:00 via INTRAVENOUS

## 2013-10-05 MED ORDER — ACETAMINOPHEN 325 MG PO TABS: 650.0000 mg | ORAL_TABLET | ORAL | Status: DC | PRN

## 2013-10-05 MED ORDER — ASPIRIN 81 MG PO CHEW
81.0000 mg | CHEWABLE_TABLET | ORAL | Status: AC
  Administered 2013-10-05: 81 mg via ORAL

## 2013-10-05 MED ORDER — ASPIRIN 81 MG PO CHEW
CHEWABLE_TABLET | ORAL | Status: AC
  Filled 2013-10-05: qty 1

## 2013-10-05 MED ORDER — SODIUM CHLORIDE 0.9 % IV SOLN: 250.0000 mL | INTRAVENOUS | Status: DC | PRN

## 2013-10-05 MED ORDER — SODIUM CHLORIDE 0.9 % IJ SOLN: 3.0000 mL | INTRAMUSCULAR | Status: DC | PRN

## 2013-10-05 NOTE — Progress Notes (Signed)
Site area: rt brachial venous sheath Site Prior to Removal:  Level 0  Pressure Applied For 10  MINUTES    Manual:   Yes.    Patient Status During Pull:  Stable   Post Pull brachial Site:  Level 0  Post Pull Instructions Given:  Yes  Post Pull Pulses Present:  Yes.    Dressing Applied:  Yes.     Bedrest begins 1805  Comments rt brachial venous sheath pulled by Moishe Spice

## 2013-10-05 NOTE — Interval H&P Note (Signed)
History and Physical Interval Note:  10/05/2013 4:40 PM  [name] [name]  has presented today for surgery, with the diagnosis of aortic insufficienicy  The various methods of treatment have been discussed with the patient and family. After consideration of risks, benefits and other options for treatment, the patient has consented to  Procedure(s): LEFT AND RIGHT HEART CATHETERIZATION WITH CORONARY ANGIOGRAM (N/A) as a surgical intervention .  The patient's history has been reviewed, patient examined, no change in status, stable for surgery.  I have reviewed the patient's chart and labs.  Questions were answered to the patient's satisfaction.     Sherren Mocha

## 2013-10-05 NOTE — H&P (View-Only) (Signed)
Admit date: 10/04/2013 Primary Cardiologist:  Fransico Him, MD Chief complaint/reason for admission: Moderate AR with reduced LVF and mild AS and enlarged ascending aorta  HPI: [name] [name] is a 68 y.o. female with a history of PVC's, dilated aortic root, moderate AR, HTN who presents today for TEE. She is doing well. She denies any chest pain, LE edema, dizziness, or syncope. She walks about 50 minutes 3 times weekly and sometimes gets a little SOB but this is stable She occasionally notices a skipped heart beat. She recently had an echo done for followup of her AR and was found to have moderately reduced LVF with mild AS and moderate AR, mildly dilated ascending aorta. Since last echo her LVF has declined. She is now here for a TEE to evaluate her AR to determine if moderate or severe   PMH:    Past Medical History  Diagnosis Date  . Gout   . Diabetes mellitus without complication   . Hypertension   . Hypercholesteremia     LDL goal < 100  . Thyroiditis 10/2009    lab and u/s-thyroid function normalized in 9/11  . History of echocardiogram 2014    Moderate AR, mild MR, grade II diastolic dysfunction,mildly dilated aorta   . Adenomatous colon polyp   . Aortic regurgitation   . Dilated aortic root     PSH:    Past Surgical History  Procedure Laterality Date  . Tubular adenomatous polyp colonoscopy  12/31/2009    ALLERGIES:   Atenolol; Penicillins; and Sulfa antibiotics  Prior to Admit Meds:   Prescriptions prior to admission  Medication Sig Dispense Refill  . aspirin EC 81 MG tablet Take 81 mg by mouth daily.      . Calcium-Vitamin D (CALTRATE 600 PLUS-VIT D PO) Take 1 tablet by mouth daily.      . colchicine (COLCRYS) 0.6 MG tablet Take 0.6 mg by mouth daily.      Marland Kitchen diltiazem (CARDIZEM CD) 240 MG 24 hr capsule Take 240 mg by mouth daily.      Marland Kitchen GLUCOSAMINE-CHONDROITIN PO Take 1 tablet by mouth daily. Glucosamine 1200 mg, chondroitin 1500 mg      . indomethacin (INDOCIN) 25  MG capsule Take 25 mg by mouth daily as needed (knee pain).      . metFORMIN (GLUCOPHAGE-XR) 500 MG 24 hr tablet Take 500 mg by mouth daily with breakfast.      . Multiple Vitamin (MULTIVITAMIN WITH MINERALS) TABS tablet Take 1 tablet by mouth daily. Centrum Silver      . naproxen sodium (ALEVE) 220 MG tablet Take 440 mg by mouth daily as needed (knee pain).      . Polyvinyl Alcohol-Povidone (REFRESH OP) Place 1 drop into both eyes daily as needed (dry eyes).       Family HX:   History reviewed. No pertinent family history. Social HX:    History   Social History  . Marital Status: Married    Spouse Name: N/A    Number of Children: N/A  . Years of Education: N/A   Occupational History  . Not on file.   Social History Main Topics  . Smoking status: Never Smoker   . Smokeless tobacco: Not on file  . Alcohol Use: No  . Drug Use: No  . Sexual Activity: Not on file   Other Topics Concern  . Not on file   Social History Narrative  . No narrative on file     ROS:  All 11 ROS were addressed and are negative except what is stated in the HPI  PHYSICAL EXAM Filed Vitals:   10/04/13 0947  BP: 161/54  Pulse: 73  Temp: 98.4 F (36.9 C)  Resp: 20   General: Well developed, well nourished, in no acute distress Head: Eyes PERRLA, No xanthomas.   Normal cephalic and atramatic  Lungs:   Clear bilaterally to auscultation and percussion. Heart:   HRRR S1 S2 Pulses are 2+ & equal.            No carotid bruit. No JVD.  No abdominal bruits. No femoral bruits. Abdomen: Bowel sounds are positive, abdomen soft and non-tender without masses Extremities:   No clubbing, cyanosis or edema.  DP +1 Neuro: Alert and oriented X 3. Psych:  Good affect, responds appropriately   Labs:   Lab Results  Component Value Date   WBC 7.7 09/30/2013   HGB 14.3 09/30/2013   HCT 41.3 09/30/2013   MCV 98.3 09/30/2013   PLT 172.0 09/30/2013    Recent Labs Lab 09/30/13 1125  NA 139  K 4.7  CL 104  CO2  28  BUN 18  CREATININE 0.8  CALCIUM 9.6  GLUCOSE 153*   No results found for this basename: CKTOTAL, CKMB, CKMBINDEX, TROPONINI   No results found for this basename: PTT   Lab Results  Component Value Date   INR 1.2* 09/30/2013     No results found for this basename: CHOL   No results found for this basename: HDL   No results found for this basename: LDLCALC   No results found for this basename: TRIG   No results found for this basename: CHOLHDL   No results found for this basename: LDLDIRECT      Radiology:  No results found.   ASSESSMENT AND PLAN:  1. PVC's - continue diltiazem  2. HTN - well controlled - continue Diltiazem  3. Dilated aortic root by echo. 4. Moderate AR 5. Mild AS 6. Moderately reduced LVF possibly secondary to AR - TEE today to assess severity of AR   Sueanne Margarita, MD  10/04/2013  10:11 AM

## 2013-10-05 NOTE — Telephone Encounter (Signed)
Letter Written for pt and faxed to [facility] at [phone].

## 2013-10-05 NOTE — Telephone Encounter (Signed)
New message ° ° ° ° ° ° °Pt returning nurse call  °

## 2013-10-05 NOTE — Discharge Instructions (Signed)
Radial Site Care °Refer to this sheet in the next few weeks. These instructions provide you with information on caring for yourself after your procedure. Your caregiver may also give you more specific instructions. Your treatment has been planned according to current medical practices, but problems sometimes occur. Call your caregiver if you have any problems or questions after your procedure. °HOME CARE INSTRUCTIONS °· You may shower the day after the procedure. Remove the bandage (dressing) and gently wash the site with plain soap and water. Gently pat the site dry. °· Do not apply powder or lotion to the site. °· Do not submerge the affected site in water for 3 to 5 days. °· Inspect the site at least twice daily. °· Do not flex or bend the affected arm for 24 hours. °· No lifting over 5 pounds (2.3 kg) for 5 days after your procedure. °· Do not drive home if you are discharged the same day of the procedure. Have someone else drive you. °· You may drive 24 hours after the procedure unless otherwise instructed by your caregiver. °· Do not operate machinery or power tools for 24 hours. °· A responsible adult should be with you for the first 24 hours after you arrive home. °What to expect: °· Any bruising will usually fade within 1 to 2 weeks. °· Blood that collects in the tissue (hematoma) may be painful to the touch. It should usually decrease in size and tenderness within 1 to 2 weeks. °SEEK IMMEDIATE MEDICAL CARE IF: °· You have unusual pain at the radial site. °· You have redness, warmth, swelling, or pain at the radial site. °· You have drainage (other than a small amount of blood on the dressing). °· You have chills. °· You have a fever or persistent symptoms for more than 72 hours. °· You have a fever and your symptoms suddenly get worse. °· Your arm becomes pale, cool, tingly, or numb. °· You have heavy bleeding from the site. Hold pressure on the site. °Document Released: 04/26/2010 Document Revised:  06/16/2011 Document Reviewed: 04/26/2010 °ExitCare® Patient Information ©2015 [employer]. This information is not intended to replace advice given to you by your health care provider. Make sure you discuss any questions you have with your health care provider. ° °

## 2013-10-05 NOTE — CV Procedure (Signed)
    Cardiac Catheterization Procedure Note  Name: [name] [name] MRN: [id number] DOB: [birth year]  Procedure: Right Heart Cath, Left Heart Cath, Selective Coronary Angiography, LV angiography, aortic root angiography  Indication: Aortic valve insufficiency, cardiomyopathy  Procedural Details: The right wrist was prepped, draped, and anesthetized with 1% lidocaine. Using the modified Seldinger technique a 5/6 French sheath was placed in the right radial artery and a 5/6 French sheath was placed in the right antecubital vein. A Swan-Ganz catheter was used for the right heart catheterization. Standard protocol was followed for recording of right heart pressures and sampling of oxygen saturations. Fick cardiac output was calculated. Standard Judkins catheters were used for selective coronary angiography, aortic root angiography, and left ventriculography. There were no immediate procedural complications. The patient was transferred to the post catheterization recovery area for further monitoring.  Procedural Findings: Hemodynamics RA mean of 4 RV 27/6 PA 24/10 with a mean of 17 PCWP mean of 8 LV 157/13 AO 146/66 with a mean 98  Oxygen saturations: PA 70 AO 92  Cardiac Output (Fick) 5.5  Cardiac Index (Fick) 3.1   Coronary angiography: Coronary dominance: right  Left mainstem: The left main is calcified. The proximal and mid left main are widely patent. The distal left main has 20-30% stenosis.  Left anterior descending (LAD): The LAD has mild calcification. The proximal vessel is widely patent with minor irregularity. The diagonal branches are widely patent. The mid LAD just after the second diagonal has a tight 90% stenosis. This is a focal lesion.  Left circumflex (LCx): The left circumflex is patent. The first obtuse marginal branch is widely patent with no obstruction noted.  Right coronary artery (RCA): The right coronary artery is dominant. The vessel has mild diffuse  calcification. The mid vessel has 20-30% stenosis. There is minor irregularity noted throughout the proximal, mid, and distal vessel. The PDA and PLA branches are widely patent.  Left ventriculography: There is mild global cardiomyopathy noted with an LVEF estimated at about 40%. There is no significant mitral regurgitation.  Aortic root angiography: The proximal ascending aorta is dilated. There is severe aortic valve insufficiency noted.  Final Conclusions:   1. Severe mid LAD stenosis with otherwise minor nonobstructive CAD 2. Mild global LV systolic dysfunction 3. Dilated aortic root with severe aortic valve insufficiency 4. Normal intracardiac hemodynamics  Sherren Mocha 10/05/2013, 5:38 PM

## 2013-10-05 NOTE — Telephone Encounter (Signed)
Confirmation of fax was received. Calling Dr Guy Sandifer office to see if there is any way pt can be fit in tomorrow.

## 2013-10-05 NOTE — Telephone Encounter (Signed)
Pt was fit in with Dr Roxy Manns on Monday 7/6 with Dr Maurie Boettcher at 12:00

## 2013-10-05 NOTE — Progress Notes (Deleted)
Site area: rt brachial  Site Prior to Removal:  Level Geographical information systems officer For 10  MINUTES    Manual:   Yes.    Patient Status During Pull:  Stable   Post Pull rt brachial Site:  Level0   Post Pull Instructions Given:  Yes.    Post Pull Pulses Present:  Yes.    Dressing Applied:  Yes.     Bedrest begins 1805  Comments rt brachial venous sheath removed by Moishe Spice. No complications

## 2013-10-05 NOTE — Telephone Encounter (Signed)
I already spoke with pt earlier today before she went in for cath.

## 2013-10-05 NOTE — Telephone Encounter (Signed)
Patient has TEE done yesterday and was told she would need to have open heart surgery. She had a cruise planned 8/8 thru 8/15 Dr Radford Pax told her she would not be able to go. She needs a note for [employer] so she doesn't lose her money. She needs that today. Please call and advise.

## 2013-10-06 ENCOUNTER — Other Ambulatory Visit: Payer: Self-pay | Admitting: General Surgery

## 2013-10-06 ENCOUNTER — Other Ambulatory Visit: Payer: Medicare Other

## 2013-10-06 ENCOUNTER — Other Ambulatory Visit (INDEPENDENT_AMBULATORY_CARE_PROVIDER_SITE_OTHER): Payer: Medicare Other

## 2013-10-06 ENCOUNTER — Telehealth: Payer: Self-pay | Admitting: Cardiology

## 2013-10-06 DIAGNOSIS — I359 Nonrheumatic aortic valve disorder, unspecified: Secondary | ICD-10-CM

## 2013-10-06 LAB — SEDIMENTATION RATE: Sed Rate: 7 mm/hr (ref 0–22)

## 2013-10-06 NOTE — Telephone Encounter (Signed)
Labs ordered. Will call pt at 8:00 to let her know to go get blood drawn.

## 2013-10-06 NOTE — Telephone Encounter (Signed)
[name] [name] put on lab schedule at [facility]

## 2013-10-06 NOTE — Telephone Encounter (Signed)
Pt is aware. And will go for lab at [facility] office today

## 2013-10-06 NOTE — Telephone Encounter (Signed)
Patient notified me that she has been having night sweats but no fever.  TEE findings of significant AI with questionable density seen on AV raises the question of endocarditis but this abnormality was only seen in 1 view.  She has not had any fevers, chills and WBC is normal so unlikely to represent endocarditis but need to rule out since she will need an AVR.  Please have patient come in today to get 2 sets of blood cultures checked and get an ESR.

## 2013-10-10 ENCOUNTER — Encounter: Payer: Self-pay | Admitting: Thoracic Surgery (Cardiothoracic Vascular Surgery)

## 2013-10-10 ENCOUNTER — Other Ambulatory Visit: Payer: Self-pay | Admitting: *Deleted

## 2013-10-10 ENCOUNTER — Institutional Professional Consult (permissible substitution) (INDEPENDENT_AMBULATORY_CARE_PROVIDER_SITE_OTHER): Payer: Medicare Other | Admitting: Thoracic Surgery (Cardiothoracic Vascular Surgery)

## 2013-10-10 VITALS — BP 129/64 | HR 77 | Resp 16 | Ht 61.0 in | Wt 170.0 lb

## 2013-10-10 DIAGNOSIS — I429 Cardiomyopathy, unspecified: Secondary | ICD-10-CM

## 2013-10-10 DIAGNOSIS — I209 Angina pectoris, unspecified: Secondary | ICD-10-CM

## 2013-10-10 DIAGNOSIS — I77810 Thoracic aortic ectasia: Secondary | ICD-10-CM

## 2013-10-10 DIAGNOSIS — I2510 Atherosclerotic heart disease of native coronary artery without angina pectoris: Secondary | ICD-10-CM

## 2013-10-10 DIAGNOSIS — I351 Nonrheumatic aortic (valve) insufficiency: Secondary | ICD-10-CM | POA: Insufficient documentation

## 2013-10-10 DIAGNOSIS — I5032 Chronic diastolic (congestive) heart failure: Secondary | ICD-10-CM | POA: Insufficient documentation

## 2013-10-10 DIAGNOSIS — I25119 Atherosclerotic heart disease of native coronary artery with unspecified angina pectoris: Secondary | ICD-10-CM

## 2013-10-10 DIAGNOSIS — I428 Other cardiomyopathies: Secondary | ICD-10-CM

## 2013-10-10 DIAGNOSIS — I359 Nonrheumatic aortic valve disorder, unspecified: Secondary | ICD-10-CM

## 2013-10-10 DIAGNOSIS — I5042 Chronic combined systolic (congestive) and diastolic (congestive) heart failure: Secondary | ICD-10-CM

## 2013-10-10 DIAGNOSIS — I509 Heart failure, unspecified: Secondary | ICD-10-CM

## 2013-10-10 NOTE — Progress Notes (Signed)
[address]       [name], [address]             [phone]     CARDIOTHORACIC SURGERY CONSULTATION REPORT  Referring Provider is [name], [name] Hong, MD PCP is [name] Argyle, MD  Chief Complaint  Patient presents with  . Aortic Insuffiency    per ECHO, TEE...Marland KitchenCATHED 10/05/13    HPI:  Patient is a 68 year old married white female from [place] with long-standing history of aortic insufficiency and recently discovered single-vessel coronary artery disease referred for possible elective aortic valve replacement and coronary artery bypass grafting. The patient states that she has had a known heart murmur for more than 20 years and she was first diagnosed with aortic insufficiency 6 or 7 years ago.  She has been followed by Dr. Radford [name] for several years with what was felt to be moderate aortic insufficiency, hypertension, and dilated aortic root.  Echocardiogram performed June of 2014 revealed moderate aortic insufficiency, mild mitral regurgitation, and normal left ventricular size and systolic function with ejection fraction estimated at 55%.  Over the last 2 months the patient has developed symptoms of exertional shortness of breath. Followup echocardiogram performed 09/28/2013 again revealed what was felt to be moderate aortic insufficiency, but there was significant drop in left ventricular function with ejection fraction estimated 35-40%.  Transesophageal echocardiogram was performed 10/04/2013. This demonstrated what appeared to be severe aortic insufficiency with an eccentric jet of regurgitation. There was moderate global left ventricular systolic dysfunction, mild mitral regurgitation, and mild tricuspid regurgitation. The aortic root was dilated.  Cardiac catheterization was performed 10/05/2013 by Dr. Burt Knack. This reveals severe single-vessel coronary artery disease with high-grade stenosis of the mid left anterior descending coronary artery. There was  dilated aortic root with severe aortic insufficiency and mild global left ventricular systolic dysfunction. Right sided pressures were normal.  The patient has been referred for cardiac surgical consultation.  The patient is married and lives locally and [name] with her husband. She is retired having previously worked for the [employer]. She remains physically active and cares for her grandchildren on a regular basis. She reports no significant physical limitations until recently when she has experienced worsening symptoms of exertional shortness of breath. She now get short of breath with moderate activity. This limits her physical activities to a mild degree. She denies any history of resting shortness of breath, PND, orthopnea, or lower extremity edema. She has had some palpitations without any chest pain or chest tightness. She's never had any dizzy spells, nor syncope.  Past Medical History  Diagnosis Date  . Gout   . Diabetes mellitus without complication   . Hypertension   . Hypercholesteremia     LDL goal < 100  . Thyroiditis 10/2009    lab and u/s-thyroid function normalized in 9/11  . Adenomatous colon polyp   . Aortic regurgitation     severe by TEE  . Dilated aortic root   . Gout   . Coronary artery disease 10/05/2013    High grade LAD stenosis  . Cardiomyopathy   . Chronic combined systolic and diastolic CHF, NYHA class 2     Past Surgical History  Procedure Laterality Date  . Tubular adenomatous polyp colonoscopy  12/31/2009  . Tee without cardioversion N/A 10/04/2013    Procedure: TRANSESOPHAGEAL ECHOCARDIOGRAM (TEE);  Surgeon: Sueanne Margarita, MD;  Location: [facility] ENDOSCOPY;  Service: Cardiovascular;  Laterality: N/A;  . Abdominal hysterectomy    .  Cataract extraction Bilateral   . Carpal tunnel release Right     No family history on file.  History   Social History  . Marital Status: Married    Spouse Name: N/A    Number of Children: N/A  . Years of  Education: N/A   Occupational History  . Not on file.   Social History Main Topics  . Smoking status: Never Smoker   . Smokeless tobacco: Not on file  . Alcohol Use: No  . Drug Use: No  . Sexual Activity: Not on file   Other Topics Concern  . Not on file   Social History Narrative  . No narrative on file    Current Outpatient Prescriptions  Medication Sig Dispense Refill  . aspirin EC 81 MG tablet Take 81 mg by mouth daily.      . Calcium-Vitamin D (CALTRATE 600 PLUS-VIT D PO) Take 1 tablet by mouth daily.      . carvedilol (COREG) 6.25 MG tablet Take 6.25 mg by mouth 2 (two) times daily with a meal.      . colchicine (COLCRYS) 0.6 MG tablet Take 0.6 mg by mouth daily.      Marland Kitchen GLUCOSAMINE-CHONDROITIN PO Take 1 tablet by mouth daily. Glucosamine 1200 mg, chondroitin 1500 mg      . indomethacin (INDOCIN) 25 MG capsule Take 25 mg by mouth daily as needed (knee pain).      . metFORMIN (GLUCOPHAGE-XR) 500 MG 24 hr tablet Take 500 mg by mouth daily with breakfast.      . Multiple Vitamin (MULTIVITAMIN WITH MINERALS) TABS tablet Take 1 tablet by mouth daily. Centrum Silver      . naproxen sodium (ALEVE) 220 MG tablet Take 440 mg by mouth daily as needed (knee pain).      . Polyvinyl Alcohol-Povidone (REFRESH OP) Place 1 drop into both eyes daily as needed (dry eyes).      . ramipril (ALTACE) 5 MG capsule Take 5 mg by mouth daily.       No current facility-administered medications for this visit.    Allergies  Allergen Reactions  . Atenolol Shortness Of Breath and Other (See Comments)    Edema and Headache  . Penicillins Other (See Comments)    G Benzathine: Local injection rash  . Sulfa Antibiotics Rash      Review of Systems:   General:  normal appetite, decreased energy, no weight gain, no weight loss, no fever  Cardiac:  no chest pain with exertion, no chest pain at rest, + SOB with exertion, no resting SOB, no PND, no orthopnea, + palpitations, no arrhythmia, no atrial  fibrillation, no LE edema, no dizzy spells, no syncope  Respiratory:  + exertional shortness of breath, no home oxygen, no productive cough, + dry cough, occasional bronchitis, no wheezing, no hemoptysis, no asthma, no pain with inspiration or cough, no sleep apnea, no CPAP at night  GI:   no difficulty swallowing, no reflux, + frequent heartburn, no hiatal hernia, no abdominal pain, no constipation, no diarrhea, no hematochezia, no hematemesis, no melena  GU:   no dysuria,  no frequency, no urinary tract infection, no hematuria, no kidney stones, no kidney disease  Vascular:  no pain suggestive of claudication, no pain in feet, no leg cramps, no varicose veins, no DVT, no non-healing foot ulcer  Neuro:   no stroke, no TIA's, no seizures, no headaches, no temporary blindness one eye,  no slurred speech, no peripheral neuropathy, no chronic pain,  no instability of gait, no memory/cognitive dysfunction  Musculoskeletal: + arthritis in both knees, no joint swelling, no myalgias, no difficulty walking, normal mobility   Skin:   no rash, no itching, no skin infections, no pressure sores or ulcerations  Psych:   no anxiety, no depression, no nervousness, no unusual recent stress  Eyes:   no blurry vision, no floaters, no recent vision changes, + wears glasses or contacts  ENT:   no hearing loss, no loose or painful teeth, no dentures, last saw dentist 2014  Hematologic:  + easy bruising, no abnormal bleeding, no clotting disorder, no frequent epistaxis  Endocrine:  + diabetes, does check CBG's at home     Physical Exam:   BP 129/64  Pulse 77  Resp 16  Ht 5\' 1"  (1.549 m)  Wt 170 lb (77.111 kg)  BMI 32.14 kg/m2  SpO2 96%  General:  Mildly obese,  well-appearing  HEENT:  Unremarkable   Neck:   no JVD, no bruits, no adenopathy   Chest:   clear to auscultation, symmetrical breath sounds, no wheezes, no rhonchi   CV:   RRR, grade III/VI diastolic murmur   Abdomen:  soft, non-tender, no masses    Extremities:  warm, well-perfused, pulses diminished but palpable, no LE edema  Rectal/GU  Deferred  Neuro:   Grossly non-focal and symmetrical throughout  Skin:   Clean and dry, no rashes, no breakdown   Diagnostic Tests:  Transthoracic Echocardiography  Patient: [name] [name], [name] [name] MR #: [id number] Study Date: 09/28/2013 Gender: F Age: 50 Height: 154.9 cm Weight: 79.8 kg BSA: 1.89 m^2 Pt. Status: Room:  ORDERING Fransico Him, MD [place], MD [place], [place] SONOGRAPHER Oletta Lamas, Will ATTENDING Loralie Champagne, M.D. PERFORMING [facility], Outpatient  cc:  ------------------------------------------------------------------- LV EF: 35% - 40%  ------------------------------------------------------------------- Indications: 424.1 Aortic valve disorders.  ------------------------------------------------------------------- History: PMH: PVCs. Acquired from the patient and from the patient&'s chart. Aortic regurgitation. Risk factors: Hypertension. Diabetes mellitus. Dyslipidemia.  ------------------------------------------------------------------- Study Conclusions  - Left ventricle: The cavity size was mildly dilated. Wall thickness was normal. Systolic function was moderately reduced. The estimated ejection fraction was in the range of 35% to 40%. Diffuse hypokinesis, worse in the inferior wall. Doppler parameters are consistent with abnormal left ventricular relaxation (grade 1 diastolic dysfunction). Internal dimension, ED (PLAX chordal): 55.9 mm. - Aortic valve: There was mild stenosis. Probably moderate aortic regurgitation. I do not see holodiastolic flow reversal in the descending thoracic aorta doppler pattern. Mean gradient (S): 11 mm Hg. Peak gradient (S): 22 mm Hg. - Aorta: Ascending aortic diameter: 44 mm (S). - Ascending aorta: The ascending aorta was dilated. - Mitral valve: Mildly calcified annulus. Normal thickness leaflets .  There was mild regurgitation. - Left atrium: The atrium was mildly dilated. - Right ventricle: The cavity size was normal. - Tricuspid valve: Peak RV-RA gradient (S): 18 mm Hg. - Pulmonary arteries: PA peak pressure: 21 mm Hg (S). - Inferior vena cava: The vessel was normal in size. The respirophasic diameter changes were in the normal range (>= 50%), consistent with normal central venous pressure.  Impressions:  - Mildly dilated LV with EF 35-40%. DIffuse hypokinesis, worse in the inferior wall. There was mild aortic stenosis and probably moderate aortic insufficiency. Normal RV size and systolic function. Given dilated LV with decreased EF, would consider TEE to more closely assess degree of aortic insufficiency.  Transthoracic echocardiography. M-mode, complete 2D, spectral Doppler, and color Doppler. Birthdate: Patient birthdate: [birth year]. Age: Patient is  68 yr old. Sex: Gender: female. Height: Height: 154.9 cm. Height: 61 in. Weight: Weight: 79.8 kg. Weight: 175.6 lb. Body mass index: BMI: 33.3 kg/m^2. Body surface area: BSA: 1.89 m^2. Blood pressure: 136/68 Patient status: Outpatient. Study date: Study date: 09/28/2013. Study time: 09:42 AM. Location: [facility]  -------------------------------------------------------------------  ------------------------------------------------------------------- Left ventricle: The cavity size was mildly dilated. Wall thickness was normal. Systolic function was moderately reduced. The estimated ejection fraction was in the range of 35% to 40%. Diffuse hypokinesis, worse in the inferior wall. Doppler parameters are consistent with abnormal left ventricular relaxation (grade 1 diastolic dysfunction).  ------------------------------------------------------------------- Aortic valve: Trileaflet; mildly calcified leaflets. Doppler: There was mild stenosis. Probably moderate aortic regurgitation. I do not see holodiastolic flow  reversal in the descending thoracic aorta doppler pattern. VTI ratio of LVOT to aortic valve: 0.33. Valve area (VTI): 1.26 cm^2. Indexed valve area (VTI): 0.67 cm^2/m^2. Valve area (Vmax): 1.25 cm^2. Indexed valve area (Vmax): 0.66 cm^2/m^2. Mean gradient (S): 11 mm Hg. Peak gradient (S): 22 mm Hg.  ------------------------------------------------------------------- Aorta: Aortic root: The aortic root was normal in size. Ascending aorta: The ascending aorta was dilated.  ------------------------------------------------------------------- Mitral valve: Mildly calcified annulus. Normal thickness leaflets . Doppler: There was no evidence for stenosis. There was mild regurgitation. Peak gradient (D): 2 mm Hg.  ------------------------------------------------------------------- Left atrium: The atrium was mildly dilated.  ------------------------------------------------------------------- Right ventricle: The cavity size was normal. Systolic function was normal.  ------------------------------------------------------------------- Pulmonic valve: Structurally normal valve. Cusp separation was normal. Doppler: Transvalvular velocity was within the normal range. There was no regurgitation.  ------------------------------------------------------------------- Tricuspid valve: Doppler: There was trivial regurgitation.  ------------------------------------------------------------------- Right atrium: The atrium was normal in size.  ------------------------------------------------------------------- Pericardium: There was no pericardial effusion.  ------------------------------------------------------------------- Systemic veins: Inferior vena cava: The vessel was normal in size. The respirophasic diameter changes were in the normal range (>= 50%), consistent with normal central venous pressure.  ------------------------------------------------------------------- Prepared and  Electronically Authenticated by  Loralie Champagne, M.D. 2015-06-24T14:19:27  ------------------------------------------------------------------- Measurements  Left ventricle Value Reference LV ID, ED, PLAX chordal (H) 55.9 mm 43 - 52 LV ID, ES, PLAX chordal (H) 45.7 mm 23 - 38 LV fx shortening, PLAX chordal (L) 18 % >=29 LV PW thickness, ED 10.4 mm --------- IVS/LV PW ratio, ED (N) 1.11 <=1.3 Stroke volume, 2D 65 ml --------- Stroke volume/bsa, 2D 34 ml/m^2 --------- LV e&', lateral 8.29 cm/s --------- LV E/e&', lateral 9.53 --------- LV e&', medial 5.36 cm/s --------- LV E/e&', medial 14.74 --------- LV e&', average 6.83 cm/s --------- LV E/e&', average 11.58 ---------  Ventricular septum Value Reference IVS thickness, ED 11.5 mm ---------  LVOT Value Reference LVOT ID, S 22 mm --------- LVOT area 3.8 cm^2 --------- LVOT ID 22 mm --------- LVOT VTI, S 17.2 cm --------- Stroke volume (SV), LVOT DP 65.4 ml --------- Stroke index (SV/bsa), LVOT DP 34.6 ml/m^2 ---------  Aortic valve Value Reference Aortic valve peak velocity, S 236 cm/s --------- Aortic valve mean velocity, S 154 cm/s --------- Aortic valve VTI, S 51.8 cm --------- Aortic mean gradient, S 11 mm Hg --------- Aortic peak gradient, S 22 mm Hg --------- VTI ratio, LVOT/AV 0.33 --------- Aortic valve area, VTI 1.26 cm^2 --------- Aortic valve area/bsa, VTI 0.67 cm^2/m^2 --------- Aortic valve area, peak velocity 1.25 cm^2 --------- Aortic valve area/bsa, peak 0.66 cm^2/m^2 --------- velocity Aortic regurg pressure half-time 325 ms ---------  Aorta Value Reference Aortic root ID, ED 35 mm --------- Ascending aorta ID, A-P, S 44 mm ---------  Left atrium  Value Reference LA ID, A-P, ES 41 mm --------- LA ID/bsa, A-P (N) 2.17 cm/m^2 <=2.2  Mitral valve Value Reference Mitral E-wave peak velocity 79 cm/s --------- Mitral A-wave peak velocity 126 cm/s --------- Mitral deceleration time (H) 320 ms 150 -  230 Mitral peak gradient, D 2 mm Hg --------- Mitral E/A ratio, peak 0.6 ---------  Pulmonary arteries Value Reference PA pressure, S, DP (N) 21 mm Hg <=30  Tricuspid valve Value Reference Tricuspid regurg peak velocity 213 cm/s --------- Tricuspid peak RV-RA gradient 18 mm Hg --------- Tricuspid maximal regurg 213 cm/s --------- velocity, PISA  Systemic veins Value Reference Estimated CVP 3 mm Hg ---------  Right ventricle Value Reference RV pressure, S, DP (N) 21 mm Hg <=30 RV s&', lateral, S 12.1 cm/s ---------  Legend: (L) and (H) mark values outside specified reference range.  (N) marks values inside specified reference range.    Transesophageal Echocardiography  (Report amended )  Patient: [name] [name], [name] [name] MR #: [id number] Study Date: 10/04/2013 Gender: F Age: 8 Height: 154.9 cm Weight: 77.3 kg BSA: 1.86 m^2 Pt. Status: Room:  ADMITTING Fransico Him, MD ATTENDING Fransico Him, MD ORDERING Fransico Him, MD PERFORMING Fransico Him, MD REFERRING Fransico Him, MD SONOGRAPHER Joanie Coddington, RDCS  cc:  ------------------------------------------------------------------- LV EF: 30% - 35%  ------------------------------------------------------------------- Indications: Aortic insufficiency 424.1.  ------------------------------------------------------------------- Study Conclusions  - Left ventricle: The cavity size was mildly dilated. Systolic function was moderately to severely reduced. The estimated ejection fraction was in the range of 30% to 35%. Wall motion was normal; there were no regional wall motion abnormalities. - Aortic valve: The AV is trileaflet and mildly thickened. In the 105 degree view the there is a density that intermittently becomes visible with leaflet coaptation but is not seen at all times and not seen any any other views. Unlikely to represent vegetation. There is a long eccentric jet of AR Mild thickening. There was severe  regurgitation directed eccentrically in the LVOT. - Mitral valve: There was mild regurgitation. - Left atrium: The atrium was mildly dilated. No evidence of thrombus in the atrial cavity or appendage. - Right atrium: No evidence of thrombus in the atrial cavity or appendage.  Diagnostic transesophageal echocardiography. 2D and color Doppler. Birthdate: Patient birthdate: [birth year]. Age: Patient is 68 yr old. Sex: Gender: female. Height: Height: 154.9 cm. Height: 61 in. Weight: Weight: 77.3 kg. Weight: 170 lb. Body mass index: BMI: 32.2 kg/m^2. Body surface area: BSA: 1.86 m^2. Blood pressure: 186/68 Patient status: Outpatient. Study date: Study date: 10/04/2013. Study time: 11:14 AM. Location: Endoscopy.  -------------------------------------------------------------------  ------------------------------------------------------------------- Left ventricle: The cavity size was mildly dilated. Systolic function was moderately to severely reduced. The estimated ejection fraction was in the range of 30% to 35%. Wall motion was normal; there were no regional wall motion abnormalities.  ------------------------------------------------------------------- Aortic valve: The AV is trileaflet and mildly thickened. In the 105 degree view the there is a density that intermittently becomes visible with leaflet coaptation but is not seen at all times and not seen any any other views. Unlikely to represent vegetation. There is a long eccentric jet of AR Trileaflet. Mild thickening. Cusp separation was normal. Doppler: There was severe regurgitation directed eccentrically in the LVOT.  ------------------------------------------------------------------- Aorta: The aorta was mildly dilated. There was no atheroma. There was no evidence for dissection. Aortic root: The aortic root was not dilated. Ascending aorta: The ascending aorta was normal in size. Aortic arch: The aortic arch was normal in  size. Descending aorta: The  descending aorta was normal in size.  ------------------------------------------------------------------- Mitral valve: Structurally normal valve. Leaflet separation was normal. Doppler: There was mild regurgitation.  ------------------------------------------------------------------- Left atrium: The atrium was mildly dilated. No evidence of thrombus in the atrial cavity or appendage. The appendage was morphologically a left appendage, multilobulated, and of normal size. Emptying velocity was normal.  ------------------------------------------------------------------- Right ventricle: The cavity size was normal. Wall thickness was normal. Systolic function was normal.  ------------------------------------------------------------------- Pulmonic valve: Structurally normal valve. Doppler: There was trivial regurgitation.  ------------------------------------------------------------------- Tricuspid valve: Structurally normal valve. Leaflet separation was normal. Doppler: There was trivial regurgitation.  ------------------------------------------------------------------- Pulmonary artery: The main pulmonary artery was normal-sized.  ------------------------------------------------------------------- Right atrium: The atrium was normal in size. No evidence of thrombus in the atrial cavity or appendage. The appendage was morphologically a right appendage.  ------------------------------------------------------------------- Pericardium: There was no pericardial effusion.  ------------------------------------------------------------------- Post procedure conclusions Ascending Aorta:  - The aorta was mildly dilated.  ------------------------------------------------------------------- Michaelle Birks, MD 2015-07-02T03:39:20       Cardiac Catheterization Procedure Note   Name: [name] [name]  MRN: [id number]  DOB: 1945-12-12  Procedure:  Right Heart Cath, Left Heart Cath, Selective Coronary Angiography, LV angiography, aortic root angiography  Indication: Aortic valve insufficiency, cardiomyopathy  Procedural Details: The right wrist was prepped, draped, and anesthetized with 1% lidocaine. Using the modified Seldinger technique a 5/6 French sheath was placed in the right radial artery and a 5/6 French sheath was placed in the right antecubital vein. A Swan-Ganz catheter was used for the right heart catheterization. Standard protocol was followed for recording of right heart pressures and sampling of oxygen saturations. Fick cardiac output was calculated. Standard Judkins catheters were used for selective coronary angiography, aortic root angiography, and left ventriculography. There were no immediate procedural complications. The patient was transferred to the post catheterization recovery area for further monitoring.  Procedural Findings:  Hemodynamics  RA mean of 4  RV 27/6  PA 24/10 with a mean of 17  PCWP mean of 8  LV 157/13  AO 146/66 with a mean 98  Oxygen saturations:  PA 70  AO 92  Cardiac Output (Fick) 5.5  Cardiac Index (Fick) 3.1  Coronary angiography:  Coronary dominance: right  Left mainstem: The left main is calcified. The proximal and mid left main are widely patent. The distal left main has 20-30% stenosis.  Left anterior descending (LAD): The LAD has mild calcification. The proximal vessel is widely patent with minor irregularity. The diagonal branches are widely patent. The mid LAD just after the second diagonal has a tight 90% stenosis. This is a focal lesion.  Left circumflex (LCx): The left circumflex is patent. The first obtuse marginal branch is widely patent with no obstruction noted.  Right coronary artery (RCA): The right coronary artery is dominant. The vessel has mild diffuse calcification. The mid vessel has 20-30% stenosis. There is minor irregularity noted throughout the proximal, mid, and distal  vessel. The PDA and PLA branches are widely patent.  Left ventriculography: There is mild global cardiomyopathy noted with an LVEF estimated at about 40%. There is no significant mitral regurgitation.  Aortic root angiography: The proximal ascending aorta is dilated. There is severe aortic valve insufficiency noted.  Final Conclusions:  1. Severe mid LAD stenosis with otherwise minor nonobstructive CAD  2. Mild global LV systolic dysfunction  3. Dilated aortic root with severe aortic valve insufficiency  4. Normal intracardiac hemodynamics  Sherren Mocha  10/05/2013, 5:38 PM  Impression:  Patient has stage D severe symptomatic aortic insufficiency with single-vessel coronary artery disease involving high-grade stenosis of the mid left anterior descending coronary artery. She has mild to moderate global left ventricular systolic dysfunction and she presents with symptoms of exertional shortness of breath consistent with chronic combined systolic and diastolic congestive heart failure, New [place] Heart Association functional class II.  I personally reviewed the patient's recent cardiac catheterization and both transthoracic and transesophageal echocardiograms.  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  I agree that she would best be treated with elective aortic valve replacement and single-vessel coronary artery bypass grafting. Aortic root replacement may be necessary.   Plan:  The patient and her family were counseled at length regarding surgical alternatives with respect to aortic valve replacement and coronary artery bypass grafting including continued medical therapy versus proceeding with conventional surgical aortic valve replacement using either a mechanical prosthesis or a bioprosthetic tissue valve.  The possible need for aortic root  replacement was discussed.  Other alternatives including the Ross autograft procedure, homograft aortic root replacement, stentless bioprosthetic tissue valve replacement, valve repair, and transcatheter aortic valve replacement were discussed.  Discussion was held comparing the relative risks of mechanical valve replacement with need for lifelong anticoagulation versus use of a bioprosthetic tissue valve and the associated potential for late structural valve deterioration in failure.  This discussion was placed in the context of the patient's particular circumstances, and as a result the patient specifically requests that their valve be replaced using a bioprosthetic tissue valve.  The patient understands and accepts all potential associated risks of surgery including but not limited to risk of death, stroke, myocardial infarction, congestive heart failure, respiratory failure, renal failure, pneumonia, bleeding requiring blood transfusion and or reexploration, arrhythmia, heart block or bradycardia requiring permanent pacemaker, aortic dissection or other major vascular complication, pleural effusions or other delayed complications related to continued congestive heart failure, and other late complications related to valve replacement including structural valve deterioration and failure, thrombosis, endocarditis, or paravalvular leak.  We will obtain cardiac gated CT angiogram gram of the heart to evaluate the aortic root and proximal ascending aorta.  We tentatively plan to proceed with surgery on Thursday, 10/27/2013. The patient will return for followup on Monday, 10/24/2013 prior to surgery.  All of their questions been addressed.   I spent in excess of 90 minutes during the conduct of this office consultation and >50% of this time involved direct face-to-face encounter with the patient for counseling and/or coordination of their care.   Valentina Gu. Roxy Manns, MD 10/10/2013 12:55 PM

## 2013-10-11 ENCOUNTER — Encounter: Payer: Self-pay | Admitting: Cardiology

## 2013-10-12 LAB — CULTURE, BLOOD (SINGLE)
Organism ID, Bacteria: NO GROWTH
Organism ID, Bacteria: NO GROWTH

## 2013-10-12 LAB — POCT ACTIVATED CLOTTING TIME: Activated Clotting Time: 152 seconds

## 2013-10-19 ENCOUNTER — Ambulatory Visit (HOSPITAL_COMMUNITY)
Admission: RE | Admit: 2013-10-19 | Discharge: 2013-10-19 | Disposition: A | Discharge status: Home / Self Care | Payer: Medicare Other | Source: Ambulatory Visit | Attending: Cardiology | Admitting: Cardiology

## 2013-10-19 ENCOUNTER — Encounter (HOSPITAL_COMMUNITY): Payer: Self-pay

## 2013-10-19 ENCOUNTER — Other Ambulatory Visit (HOSPITAL_COMMUNITY): Payer: Self-pay | Admitting: Interventional Radiology

## 2013-10-19 DIAGNOSIS — I359 Nonrheumatic aortic valve disorder, unspecified: Secondary | ICD-10-CM

## 2013-10-19 DIAGNOSIS — I2510 Atherosclerotic heart disease of native coronary artery without angina pectoris: Secondary | ICD-10-CM | POA: Insufficient documentation

## 2013-10-19 DIAGNOSIS — I351 Nonrheumatic aortic (valve) insufficiency: Secondary | ICD-10-CM

## 2013-10-19 DIAGNOSIS — Z01818 Encounter for other preprocedural examination: Secondary | ICD-10-CM | POA: Insufficient documentation

## 2013-10-19 LAB — GLUCOSE, CAPILLARY: Glucose-Capillary: 83 mg/dL (ref 70–99)

## 2013-10-19 MED ORDER — IOHEXOL 350 MG/ML SOLN
100.0000 mL | Freq: Once | INTRAVENOUS | Status: AC | PRN
  Administered 2013-10-19: 100 mL via INTRAVENOUS

## 2013-10-19 NOTE — Progress Notes (Signed)
In for CT angio of heart.   Did not see anything to attempt for an PIV especially not an #18 gauge.   Pt stated,  "They have a horrible time with my veins"     Informed of her PICC and recommendation of one when she comes in for surgery on her heart.

## 2013-10-19 NOTE — Progress Notes (Signed)
Pt to nurses station post Ct heart at 3:40. Shaking uncontrollably and tearful.  No hives, wheezing  or SOB.  Ct tech called to check pt for reaction.  114/48, HR 70.  Glucose checked 83, given po's and warm blankets. Reassurance given, stressful day with difficult IV start in IR with [place].  Pt feeling much better by 1600, VSS, IV d/c'd, assisted to car via [place].  S/S of contrast reaction explained.  Pt and husband verbalize understanding.  Metformin hold X 48 hrs instructed as well.

## 2013-10-19 NOTE — Progress Notes (Signed)
2 unsuccessful 18 gauge (for CT heart) attempts in both [facility].  IV team notified for start.  MD notified as well.

## 2013-10-20 ENCOUNTER — Encounter (HOSPITAL_COMMUNITY): Payer: Self-pay | Admitting: Pharmacy Technician

## 2013-10-24 ENCOUNTER — Telehealth: Payer: Self-pay | Admitting: Cardiology

## 2013-10-24 ENCOUNTER — Encounter: Payer: Self-pay | Admitting: Thoracic Surgery (Cardiothoracic Vascular Surgery)

## 2013-10-24 ENCOUNTER — Encounter (HOSPITAL_COMMUNITY): Payer: Self-pay

## 2013-10-24 ENCOUNTER — Other Ambulatory Visit (HOSPITAL_COMMUNITY): Payer: Medicare Other

## 2013-10-24 ENCOUNTER — Ambulatory Visit (HOSPITAL_COMMUNITY)
Admission: RE | Admit: 2013-10-24 | Discharge: 2013-10-24 | Disposition: A | Discharge status: Home / Self Care | Payer: Medicare Other | Source: Ambulatory Visit | Attending: Thoracic Surgery (Cardiothoracic Vascular Surgery) | Admitting: Thoracic Surgery (Cardiothoracic Vascular Surgery)

## 2013-10-24 ENCOUNTER — Ambulatory Visit (INDEPENDENT_AMBULATORY_CARE_PROVIDER_SITE_OTHER): Payer: Medicare Other | Admitting: Thoracic Surgery (Cardiothoracic Vascular Surgery)

## 2013-10-24 ENCOUNTER — Encounter (HOSPITAL_COMMUNITY): Payer: Medicare Other

## 2013-10-24 ENCOUNTER — Encounter (HOSPITAL_COMMUNITY)
Admission: RE | Admit: 2013-10-24 | Discharge: 2013-10-24 | Disposition: A | Discharge status: Home / Self Care | Payer: Medicare Other | Source: Ambulatory Visit | Attending: Thoracic Surgery (Cardiothoracic Vascular Surgery) | Admitting: Thoracic Surgery (Cardiothoracic Vascular Surgery)

## 2013-10-24 VITALS — BP 135/64 | HR 71 | Resp 20 | Ht 61.0 in | Wt 170.0 lb

## 2013-10-24 VITALS — BP 132/58 | HR 62 | Temp 98.1°F | Resp 20 | Ht 61.0 in | Wt 170.1 lb

## 2013-10-24 DIAGNOSIS — I25119 Atherosclerotic heart disease of native coronary artery with unspecified angina pectoris: Secondary | ICD-10-CM

## 2013-10-24 DIAGNOSIS — I509 Heart failure, unspecified: Secondary | ICD-10-CM

## 2013-10-24 DIAGNOSIS — I359 Nonrheumatic aortic valve disorder, unspecified: Secondary | ICD-10-CM

## 2013-10-24 DIAGNOSIS — I2510 Atherosclerotic heart disease of native coronary artery without angina pectoris: Secondary | ICD-10-CM

## 2013-10-24 DIAGNOSIS — Z01818 Encounter for other preprocedural examination: Secondary | ICD-10-CM | POA: Diagnosis not present

## 2013-10-24 DIAGNOSIS — I6529 Occlusion and stenosis of unspecified carotid artery: Secondary | ICD-10-CM | POA: Insufficient documentation

## 2013-10-24 DIAGNOSIS — I351 Nonrheumatic aortic (valve) insufficiency: Secondary | ICD-10-CM

## 2013-10-24 DIAGNOSIS — I429 Cardiomyopathy, unspecified: Secondary | ICD-10-CM

## 2013-10-24 DIAGNOSIS — I428 Other cardiomyopathies: Secondary | ICD-10-CM

## 2013-10-24 DIAGNOSIS — I209 Angina pectoris, unspecified: Secondary | ICD-10-CM

## 2013-10-24 DIAGNOSIS — I658 Occlusion and stenosis of other precerebral arteries: Secondary | ICD-10-CM | POA: Insufficient documentation

## 2013-10-24 DIAGNOSIS — Z01810 Encounter for preprocedural cardiovascular examination: Secondary | ICD-10-CM | POA: Insufficient documentation

## 2013-10-24 DIAGNOSIS — I5042 Chronic combined systolic (congestive) and diastolic (congestive) heart failure: Secondary | ICD-10-CM

## 2013-10-24 DIAGNOSIS — I77810 Thoracic aortic ectasia: Secondary | ICD-10-CM

## 2013-10-24 HISTORY — DX: Cardiac murmur, unspecified: R01.1

## 2013-10-24 HISTORY — DX: Unspecified osteoarthritis, unspecified site: M19.90

## 2013-10-24 HISTORY — DX: Gastro-esophageal reflux disease without esophagitis: K21.9

## 2013-10-24 LAB — SURGICAL PCR SCREEN
MRSA, PCR: NEGATIVE
Staphylococcus aureus: NEGATIVE

## 2013-10-24 LAB — URINE MICROSCOPIC-ADD ON

## 2013-10-24 LAB — PULMONARY FUNCTION TEST
DL/VA % pred: 91 %
DL/VA: 4.03 ml/min/mmHg/L
DLCO cor % pred: 84 %
DLCO cor: 17.16 ml/min/mmHg
DLCO unc % pred: 84 %
DLCO unc: 17.16 ml/min/mmHg
FEF 25-75 Post: 2.44 L/sec
FEF 25-75 Pre: 2.64 L/sec
FEF2575-%Change-Post: -7 %
FEF2575-%Pred-Post: 142 %
FEF2575-%Pred-Pre: 154 %
FEV1-%Change-Post: -1 %
FEV1-%Pred-Post: 113 %
FEV1-%Pred-Pre: 115 %
FEV1-Post: 2.24 L
FEV1-Pre: 2.28 L
FEV1FVC-%Change-Post: -5 %
FEV1FVC-%Pred-Pre: 111 %
FEV6-%Change-Post: 3 %
FEV6-%Pred-Post: 111 %
FEV6-%Pred-Pre: 107 %
FEV6-Post: 2.8 L
FEV6-Pre: 2.7 L
FEV6FVC-%Change-Post: 0 %
FEV6FVC-%Pred-Post: 105 %
FEV6FVC-%Pred-Pre: 105 %
FVC-%Change-Post: 3 %
FVC-%Pred-Post: 106 %
FVC-%Pred-Pre: 103 %
FVC-Post: 2.8 L
FVC-Pre: 2.7 L
Post FEV1/FVC ratio: 80 %
Post FEV6/FVC ratio: 100 %
Pre FEV1/FVC ratio: 84 %
Pre FEV6/FVC Ratio: 100 %
RV % pred: 44 %
RV: 0.92 L
TLC % pred: 80 %
TLC: 3.72 L

## 2013-10-24 LAB — COMPREHENSIVE METABOLIC PANEL
ALT: 29 U/L (ref 0–35)
AST: 39 U/L — ABNORMAL HIGH (ref 0–37)
Albumin: 3.9 g/dL (ref 3.5–5.2)
Alkaline Phosphatase: 57 U/L (ref 39–117)
Anion gap: 16 — ABNORMAL HIGH (ref 5–15)
BUN: 12 mg/dL (ref 6–23)
CO2: 21 mEq/L (ref 19–32)
Calcium: 9.8 mg/dL (ref 8.4–10.5)
Chloride: 101 mEq/L (ref 96–112)
Creatinine, Ser: 0.58 mg/dL (ref 0.50–1.10)
GFR calc Af Amer: >90 mL/min (ref >90)
GFR calc non Af Amer: >90 mL/min (ref >90)
Glucose, Bld: 85 mg/dL (ref 70–99)
Potassium: 4.7 mEq/L (ref 3.7–5.3)
Sodium: 138 mEq/L (ref 137–147)
Total Bilirubin: 0.5 mg/dL (ref 0.3–1.2)
Total Protein: 7.5 g/dL (ref 6.0–8.3)

## 2013-10-24 LAB — CBC
HCT: 40.8 % (ref 36.0–46.0)
Hemoglobin: 14.7 g/dL (ref 12.0–15.0)
MCH: 35.3 pg — ABNORMAL HIGH (ref 26.0–34.0)
MCHC: 36 g/dL (ref 30.0–36.0)
MCV: 97.8 fL (ref 78.0–100.0)
Platelets: 174 10*3/uL (ref 150–400)
RBC: 4.17 MIL/uL (ref 3.87–5.11)
RDW: 12.6 % (ref 11.5–15.5)
WBC: 8 10*3/uL (ref 4.0–10.5)

## 2013-10-24 LAB — BLOOD GAS, ARTERIAL
Acid-Base Excess: 0.6 mmol/L (ref 0.0–2.0)
Bicarbonate: 24 mEq/L (ref 20.0–24.0)
Drawn by: 344381
FIO2: 0.21 %
O2 Saturation: 98.7 %
Patient temperature: 98.6
TCO2: 25.1 mmol/L (ref 0–100)
pCO2 arterial: 34.3 mmHg — ABNORMAL LOW (ref 35.0–45.0)
pH, Arterial: 7.459 — ABNORMAL HIGH (ref 7.350–7.450)
pO2, Arterial: 106 mmHg — ABNORMAL HIGH (ref 80.0–100.0)

## 2013-10-24 LAB — URINALYSIS, ROUTINE W REFLEX MICROSCOPIC
Bilirubin Urine: NEGATIVE
Glucose, UA: NEGATIVE mg/dL
Hgb urine dipstick: NEGATIVE
Ketones, ur: NEGATIVE mg/dL
Nitrite: NEGATIVE
Protein, ur: NEGATIVE mg/dL
Specific Gravity, Urine: 1.014 (ref 1.005–1.030)
Urobilinogen, UA: 0.2 mg/dL (ref 0.0–1.0)
pH: 6.5 (ref 5.0–8.0)

## 2013-10-24 LAB — PROTIME-INR
INR: 1.1 (ref 0.00–1.49)
Prothrombin Time: 14.2 seconds (ref 11.6–15.2)

## 2013-10-24 LAB — APTT: aPTT: 32 seconds (ref 24–37)

## 2013-10-24 MED ORDER — ALBUTEROL SULFATE (2.5 MG/3ML) 0.083% IN NEBU
2.5000 mg | INHALATION_SOLUTION | Freq: Once | RESPIRATORY_TRACT | Status: AC
  Administered 2013-10-24: 2.5 mg via RESPIRATORY_TRACT

## 2013-10-24 NOTE — Pre-Procedure Instructions (Signed)
[name] [name]  10/24/2013   Your procedure is scheduled on:  Thursday, July 23.  Report to [facility] Admitting at 5:30 AM.  Call this number if you have problems the morning of surgery: [phone]   Remember:   Do not eat food or drink liquids after midnight Wednesday, July 22.   Take these medicines the morning of surgery with A SIP OF WATER: carvedilol (COREG).               Stop taking NSAIDs - Aspirin, indomethacin (INDOCIN), naproxen sodium (ALEVE), also stop taking Vitamins and Herbal Medications.      Do not wear jewelry, make-up or nail polish.  Do not wear lotions, powders, or perfumes.  Do not shave 48 hours prior to surgery.   Do not bring valuables to the hospital.               [facility] is not responsible for any belongings or valuables.               Contacts, dentures or bridgework may not be worn into surgery.  Leave suitcase in the car. After surgery it may be brought to your room.  For patients admitted to the hospital, discharge time is determined by your treatment team.               Special Instructions: Review  [place] - Preparing For Surgery.   Please read over the following fact sheets that you were given: Pain Booklet, Coughing and Deep Breathing, Blood Transfusion Information and Surgical Site Infection Prevention

## 2013-10-24 NOTE — Patient Instructions (Signed)
Stop taking Altace  On the morning of surgery take only Coreg (carvedilol) with a sip of water

## 2013-10-24 NOTE — Telephone Encounter (Signed)
Will forward to PCP so they can talk to pt about nodule.

## 2013-10-24 NOTE — Progress Notes (Signed)
VASCULAR LAB PRELIMINARY  PRELIMINARY  PRELIMINARY  PRELIMINARY  Pre-op Cardiac Surgery  Carotid Findings:  Bilateral:  1-39% ICA stenosis.  Vertebral artery flow is antegrade.      Upper Extremity Right Left  Brachial Pressures 134 triphasic 134 triphasic  Radial Waveforms triphasic triphasic  Ulnar Waveforms triphasic triphasic  Palmar Arch (Allen's Test) WNL WNL   Findings:  Doppler waveforms remain normal with ulnar and radial compressions.    Lower  Extremity Right Left  Dorsalis Pedis    Anterior Tibial    Posterior Tibial    Ankle/Brachial Indices      Findings:  Palpable pedal pulses x 4.    [name] [name], RVT 10/24/2013, 1:53 PM

## 2013-10-24 NOTE — Progress Notes (Signed)
[address]       [place] [address]             [phone]     CARDIOTHORACIC SURGERY OFFICE NOTE  Referring Provider is Sueanne Margarita, MD PCP is Mathews Argyle, MD   HPI:  Patient returns for routine followup of severe symptomatic aortic insufficiency and single-vessel coronary artery disease. She was originally seen in consultation on 10/10/2013. Over the past 2 weeks the patient reports no new problems or complaints, and she is eager to proceed with surgery later this week as originally scheduled.  However, she does note that ever since she began taking Altase several weeks ago she has had a persistent dry nonproductive cough.   Current Outpatient Prescriptions  Medication Sig Dispense Refill  . aspirin EC 81 MG tablet Take 81 mg by mouth daily.      . Calcium-Vitamin D (CALTRATE 600 PLUS-VIT D PO) Take 1 tablet by mouth daily.      . carvedilol (COREG) 6.25 MG tablet Take 6.25 mg by mouth 2 (two) times daily with a meal.      . colchicine (COLCRYS) 0.6 MG tablet Take 0.6 mg by mouth daily.      Marland Kitchen GLUCOSAMINE-CHONDROITIN PO Take 1 tablet by mouth daily. Glucosamine 1200 mg, chondroitin 1500 mg      . indomethacin (INDOCIN) 25 MG capsule Take 25 mg by mouth daily as needed (knee pain).      . metFORMIN (GLUCOPHAGE-XR) 500 MG 24 hr tablet Take 500 mg by mouth daily with breakfast.      . Multiple Vitamin (MULTIVITAMIN WITH MINERALS) TABS tablet Take 1 tablet by mouth daily. Centrum Silver      . naproxen sodium (ALEVE) 220 MG tablet Take 440 mg by mouth daily as needed (knee pain).      . Polyvinyl Alcohol-Povidone (REFRESH OP) Place 1 drop into both eyes daily as needed (dry eyes).      . ramipril (ALTACE) 5 MG capsule Take 5 mg by mouth daily.       No current facility-administered medications for this visit.      Physical Exam:   BP 135/64  Pulse 71  Resp 20  Ht 5\' 1"  (1.549 m)  Wt 170 lb (77.111 kg)  BMI 32.14 kg/m2  SpO2  96%  General:  Well-appearing  Chest:   Clear to auscultation  CV:   Regular rate and rhythm with both systolic and diastolic murmurs  Incisions:  n/a  Abdomen:  Soft and nontender  Extremities:  Warm and well-perfused  Diagnostic Tests:  Cardiac Gated CT  TECHNIQUE:  The patient was scanned on a Philips 256 scanner. A 120 kV  retrospective scan was triggered in the descending thoracic aorta at  111 HU's. Gantry rotation speed was 270 msecs and collimation was .9  mm. No beta blockade or nitro were given. The 3D data set was  reconstructed in 10% intervals of the R-R cycle. Systolic and  diastolic phases were analyzed on a dedicated work station using  MPR, MIP and VRT modes. The patient received 80 cc of contrast.  FINDINGS:  Aortic Valve: Trileaflet No significant calcification Thickened  leaflet tips. Appears to be a large area of central malcoaptation in  diastole  Ascending Aorta:  Short Axis: 38.1 mm  Long Axis: 39.6 mm  Average: 39 mm  Sinotubular Junction:  Short Axis: 34 mm  Long Axis: 36.7 mm  Average: 35.3 mm  Sinus  of Valsalva:  Short Axis: 35.8 mm  Long Axis: 39.5 mm  Average 37.6 mm  Descending Thoracic Aorta: 25 mm  The great vessels arise normally from the arch. There is mild  calcification of the arch and descending thoracic aorta. There is no  significant atheroma The left subclavian is  Calcium Score: 198 mostly in the LAD and circumflex  Coronary Arteries: Right dominant with normal origins. LM- calcified  nonobstructive plaque LAD >75% lesion just distal to D2 Circumflex  and RCA without obstructive disease  IMPRESSION:  1) Calcium Score 198 67th percentile for age and sex matched  controls  2) Single vessel obstructive CAD in distal LAD after D2  3) Noncalcified trileaflet aortic valve with large area of  malcoaptation in diastole  4) Aortic Root dilatation with maximal diameter at RPA of 39 mm  5) Normal origin of the great vessels with mild  calcific plaque in  the arch and tortuous left subclavian artery and patent LIMA  Jenkins Rouge  :  Medications: None  Electronically Signed:  By: Jenkins Rouge M.D.  On: 10/19/2013 17:21   Halina Andreas, MD Thu Oct 20, 2013 12:00:17 PM EDT       ADDENDUM REPORT: 10/20/2013 11:57  ADDENDUM:  OVER-READ INTERPRETATION CT CHEST  The following report is an over-read performed by radiologist Dr.  Alvino Blood [facility] on Creation date. This  over-read does not include interpretation of cardiac or coronary  anatomy or pathology. The CTA interpretation by the cardiologist is  attached.  Review of the pulmonary parenchyma demonstrates a 4 mm nodule in the  right upper lobe (image 47, series 202). There is mild reticular  pattern at the lung bases  There is no mediastinal lymphadenopathy evident. Limited view of the  upper abdomen is unremarkable. Limited view of the skeleton is  unremarkable.  IMPRESSION:  Single right upper lobe 4 mm pulmonary nodule.  If the patient is at high risk for bronchogenic carcinoma, follow-up  chest CT at 1 year is recommended. If the patient is at low risk, no  follow-up is needed. This recommendation follows the consensus  statement: Guidelines for Management of Small Pulmonary Nodules  Detected on CT Scans: A Statement from the [place] as  published in Radiology 2005; 237:395-400.  These results will be called to the ordering clinician or  representative by the Radiologist Assistant, and communication  documented in the PACS or zVision Dashboard.  Electronically Signed  By: Suzy Bouchard M.D.  On: 10/20/2013 11:57       Impression:  Patient has stage D severe symptomatic aortic insufficiency with single-vessel coronary artery disease involving high-grade stenosis of the mid left anterior descending coronary artery. She has mild to moderate global left ventricular systolic dysfunction and she presents with  symptoms of exertional shortness of breath consistent with chronic combined systolic and diastolic congestive heart failure, [place] Heart Association functional class II. CT angiogram confirmed the presence of aneurysmal enlargement of the aortic root. 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 I feel that she would best be treated with elective aortic root replacement and single-vessel coronary artery bypass grafting.    Plan:  I have again reviewed the indications, risks, and potential benefits of surgery at length with the patient and her wife here in the office today. Alternative treatment strategies been discussed.  They understand  and accept all potential risks of surgery including but not limited to risk of death, stroke or other neurologic complication, myocardial infarction, congestive heart failure, respiratory failure, renal failure, bleeding requiring transfusion and/or reexploration, arrhythmia, infection or other wound complications, pneumonia, pleural and/or pericardial effusion, pulmonary embolus, aortic dissection or other major vascular complication, or delayed complications related to valve repair or replacement including but not limited to structural valve deterioration and failure, thrombosis, embolization, endocarditis, or paravalvular leak.  All of their questions have been answered.  I have instructed her to stop taking Altace.    I spent in excess of 30 minutes during the conduct of this office consultation and >50% of this time involved direct face-to-face encounter with the patient for counseling and/or coordination of their care.   Valentina Gu. Roxy Manns, MD 10/24/2013 11:14 AM

## 2013-10-24 NOTE — Telephone Encounter (Signed)
Please let patient know that chest CT showed a small pulmonary nodule.  Please forward CT scan to PCP to followup on pulmonary nodule with repeat CT scan in 1 year

## 2013-10-25 ENCOUNTER — Ambulatory Visit (HOSPITAL_COMMUNITY): Payer: Medicare Other

## 2013-10-25 LAB — HEMOGLOBIN A1C
Hgb A1c MFr Bld: 6.8 % — ABNORMAL HIGH (ref <5.7)
Mean Plasma Glucose: 148 mg/dL — ABNORMAL HIGH (ref <117)

## 2013-10-25 NOTE — Progress Notes (Addendum)
Blood Bank called and informed me that patient has antibodies and will need another sample drawn T & S DOS (23RD).

## 2013-10-25 NOTE — H&P (Signed)
[address]       [place] [address]             [phone]          CARDIOTHORACIC SURGERY HISTORY AND PHYSICAL EXAM  Referring Provider is TURNER, Eber Hong, MD PCP is Mathews Argyle, MD    Chief Complaint   Patient presents with   .  Aortic Insuffiency       per ECHO, TEE...Marland KitchenCATHED 10/05/13     HPI:  Patient is a 68 year old married white female from [place] with long-standing history of aortic insufficiency and recently discovered single-vessel coronary artery disease referred for possible elective aortic valve replacement and coronary artery bypass grafting. The patient states that she has had a known heart murmur for more than 20 years and she was first diagnosed with aortic insufficiency 6 or 7 years ago.  She has been followed by Dr. Radford Pax for several years with what was felt to be moderate aortic insufficiency, hypertension, and dilated aortic root.  Echocardiogram performed June of 2014 revealed moderate aortic insufficiency, mild mitral regurgitation, and normal left ventricular size and systolic function with ejection fraction estimated at 55%.  Over the last 2 months the patient has developed symptoms of exertional shortness of breath. Followup echocardiogram performed 09/28/2013 again revealed what was felt to be moderate aortic insufficiency, but there was significant drop in left ventricular function with ejection fraction estimated 35-40%.  Transesophageal echocardiogram was performed 10/04/2013. This demonstrated what appeared to be severe aortic insufficiency with an eccentric jet of regurgitation. There was moderate global left ventricular systolic dysfunction, mild mitral regurgitation, and mild tricuspid regurgitation. The aortic root was dilated.  Cardiac catheterization was performed 10/05/2013 by Dr. Burt Knack. This reveals severe single-vessel coronary artery disease with high-grade stenosis of the mid left anterior descending coronary  artery. There was dilated aortic root with severe aortic insufficiency and mild global left ventricular systolic dysfunction. Right sided pressures were normal.  The patient was referred for cardiac surgical consultation.  She was originally seen in consultation on 10/10/2013. Over the past 2 weeks the patient reports no new problems or complaints, and she is eager to proceed with surgery later this week as originally scheduled.  However, she does note that ever since she began taking Altase several weeks ago she has had a persistent dry nonproductive cough.  The patient is married and lives locally and [place] with her husband. She is retired having previously worked for the [employer]. She remains physically active and cares for her grandchildren on a regular basis. She reports no significant physical limitations until recently when she has experienced worsening symptoms of exertional shortness of breath. She now get short of breath with moderate activity. This limits her physical activities to a mild degree. She denies any history of resting shortness of breath, PND, orthopnea, or lower extremity edema. She has had some palpitations without any chest pain or chest tightness. She's never had any dizzy spells, nor syncope.       Past Medical History  Diagnosis Date  . Gout   . Diabetes mellitus without complication   . Hypertension   . Hypercholesteremia     LDL goal < 100  . Thyroiditis 10/2009    lab and u/s-thyroid function normalized in 9/11  . Adenomatous colon polyp   . Aortic regurgitation     severe by TEE  . Dilated aortic root   . Gout   . Coronary artery  disease 10/05/2013    High grade LAD stenosis  . Cardiomyopathy   . Chronic combined systolic and diastolic CHF, NYHA class 2   . Complication of anesthesia   . Heart murmur   . Shortness of breath     with exertion  . GERD (gastroesophageal reflux disease)     Tums prn  . Arthritis     Past Surgical History   Procedure Laterality Date  . Tubular adenomatous polyp colonoscopy  12/31/2009  . Tee without cardioversion N/A 10/04/2013    Procedure: TRANSESOPHAGEAL ECHOCARDIOGRAM (TEE);  Surgeon: Sueanne Margarita, MD;  Location: [facility] ENDOSCOPY;  Service: Cardiovascular;  Laterality: N/A;  . Abdominal hysterectomy    . Cataract extraction Bilateral   . Carpal tunnel release Right   . Colonoscopy      No family history on file.  Social History History  Substance Use Topics  . Smoking status: Never Smoker   . Smokeless tobacco: Not on file  . Alcohol Use: No    Prior to Admission medications   Medication Sig Start Date End Date Taking? Authorizing Provider  aspirin EC 81 MG tablet Take 81 mg by mouth daily.   Yes Historical Provider, MD  Calcium-Vitamin D (CALTRATE 600 PLUS-VIT D PO) Take 1 tablet by mouth daily.   Yes Historical Provider, MD  carvedilol (COREG) 6.25 MG tablet Take 6.25 mg by mouth 2 (two) times daily with a meal.   Yes Historical Provider, MD  colchicine (COLCRYS) 0.6 MG tablet Take 0.6 mg by mouth daily.   Yes Historical Provider, MD  GLUCOSAMINE-CHONDROITIN PO Take 1 tablet by mouth daily. Glucosamine 1200 mg, chondroitin 1500 mg   Yes Historical Provider, MD  indomethacin (INDOCIN) 25 MG capsule Take 25 mg by mouth daily as needed (knee pain).   Yes Historical Provider, MD  metFORMIN (GLUCOPHAGE-XR) 500 MG 24 hr tablet Take 500 mg by mouth daily with breakfast.   Yes Historical Provider, MD  Multiple Vitamin (MULTIVITAMIN WITH MINERALS) TABS tablet Take 1 tablet by mouth daily. Centrum Silver   Yes Historical Provider, MD  naproxen sodium (ALEVE) 220 MG tablet Take 440 mg by mouth daily as needed (knee pain).   Yes Historical Provider, MD  Polyvinyl Alcohol-Povidone (REFRESH OP) Place 1 drop into both eyes daily as needed (dry eyes).   Yes Historical Provider, MD    Allergies  Allergen Reactions  . Atenolol Shortness Of Breath and Other (See Comments)    Edema and Headache    . Penicillins Other (See Comments)    G Benzathine: Local injection rash  . Sulfa Antibiotics Rash      Review of Systems:              General:                      normal appetite, decreased energy, no weight gain, no weight loss, no fever             Cardiac:                      no chest pain with exertion, no chest pain at rest, + SOB with exertion, no resting SOB, no PND, no orthopnea, + palpitations, no arrhythmia, no atrial fibrillation, no LE edema, no dizzy spells, no syncope             Respiratory:                + exertional  shortness of breath, no home oxygen, no productive cough, + dry cough, occasional bronchitis, no wheezing, no hemoptysis, no asthma, no pain with inspiration or cough, no sleep apnea, no CPAP at night             GI:                                no difficulty swallowing, no reflux, + frequent heartburn, no hiatal hernia, no abdominal pain, no constipation, no diarrhea, no hematochezia, no hematemesis, no melena             GU:                              no dysuria,  no frequency, no urinary tract infection, no hematuria, no kidney stones, no kidney disease             Vascular:                     no pain suggestive of claudication, no pain in feet, no leg cramps, no varicose veins, no DVT, no non-healing foot ulcer             Neuro:                         no stroke, no TIA's, no seizures, no headaches, no temporary blindness one eye,  no slurred speech, no peripheral neuropathy, no chronic pain, no instability of gait, no memory/cognitive dysfunction             Musculoskeletal:         + arthritis in both knees, no joint swelling, no myalgias, no difficulty walking, normal mobility               Skin:                            no rash, no itching, no skin infections, no pressure sores or ulcerations             Psych:                         no anxiety, no depression, no nervousness, no unusual recent stress             Eyes:                            no blurry vision, no floaters, no recent vision changes, + wears glasses or contacts             ENT:                            no hearing loss, no loose or painful teeth, no dentures, last saw dentist 2014             Hematologic:               + easy bruising, no abnormal bleeding, no clotting disorder, no frequent epistaxis             Endocrine:                   + diabetes, does check  CBG's at home                           Physical Exam:              BP 129/64  Pulse 77  Resp 16  Ht 5\' 1"  (1.549 m)  Wt 170 lb (77.111 kg)  BMI 32.14 kg/m2  SpO2 96%             General:                      Mildly obese,  well-appearing             HEENT:                       Unremarkable               Neck:                           no JVD, no bruits, no adenopathy               Chest:                         clear to auscultation, symmetrical breath sounds, no wheezes, no rhonchi               CV:                              RRR, grade III/VI diastolic murmur               Abdomen:                    soft, non-tender, no masses               Extremities:                 warm, well-perfused, pulses diminished but palpable, no LE edema             Rectal/GU                   Deferred             Neuro:                         Grossly non-focal and symmetrical throughout             Skin:                            Clean and dry, no rashes, no breakdown   Diagnostic Tests:  Transthoracic Echocardiography  Patient: Eldena, Dede MR #: [id number] Study Date: 09/28/2013 Gender: F Age: 36 Height: 154.9 cm Weight: 79.8 kg BSA: 1.89 m^2 Pt. Status: Room:  ORDERING Fransico Him, MD [place], MD [place], [place] SONOGRAPHER Oletta Lamas, Will ATTENDING Loralie Champagne, M.D. PERFORMING [facility] Outpatient  cc:  ------------------------------------------------------------------- LV EF: 35% -  40%  ------------------------------------------------------------------- Indications: 424.1 Aortic valve disorders.  ------------------------------------------------------------------- History: PMH: PVCs. Acquired from the patient and from the patient&'s chart. Aortic regurgitation. Risk factors: Hypertension. Diabetes mellitus. Dyslipidemia.  ------------------------------------------------------------------- Study Conclusions  - Left ventricle: The cavity size was mildly dilated. Wall  thickness was normal. Systolic function was moderately reduced. The estimated ejection fraction was in the range of 35% to 40%. Diffuse hypokinesis, worse in the inferior wall. Doppler parameters are consistent with abnormal left ventricular relaxation (grade 1 diastolic dysfunction). Internal dimension, ED (PLAX chordal): 55.9 mm. - Aortic valve: There was mild stenosis. Probably moderate aortic regurgitation. I do not see holodiastolic flow reversal in the descending thoracic aorta doppler pattern. Mean gradient (S): 11 mm Hg. Peak gradient (S): 22 mm Hg. - Aorta: Ascending aortic diameter: 44 mm (S). - Ascending aorta: The ascending aorta was dilated. - Mitral valve: Mildly calcified annulus. Normal thickness leaflets . There was mild regurgitation. - Left atrium: The atrium was mildly dilated. - Right ventricle: The cavity size was normal. - Tricuspid valve: Peak RV-RA gradient (S): 18 mm Hg. - Pulmonary arteries: PA peak pressure: 21 mm Hg (S). - Inferior vena cava: The vessel was normal in size. The respirophasic diameter changes were in the normal range (>= 50%), consistent with normal central venous pressure.  Impressions:  - Mildly dilated LV with EF 35-40%. DIffuse hypokinesis, worse in the inferior wall. There was mild aortic stenosis and probably moderate aortic insufficiency. Normal RV size and systolic function. Given dilated LV with decreased EF, would consider TEE to more  closely assess degree of aortic insufficiency.  Transthoracic echocardiography. M-mode, complete 2D, spectral Doppler, and color Doppler. Birthdate: Patient birthdate: Feb 15, 1946. Age: Patient is 68 yr old. Sex: Gender: female. Height: Height: 154.9 cm. Height: 61 in. Weight: Weight: 79.8 kg. Weight: 175.6 lb. Body mass index: BMI: 33.3 kg/m^2. Body surface area: BSA: 1.89 m^2. Blood pressure: 136/68 Patient status: Outpatient. Study date: Study date: 09/28/2013. Study time: 09:42 AM. Location: [facility]  -------------------------------------------------------------------  ------------------------------------------------------------------- Left ventricle: The cavity size was mildly dilated. Wall thickness was normal. Systolic function was moderately reduced. The estimated ejection fraction was in the range of 35% to 40%. Diffuse hypokinesis, worse in the inferior wall. Doppler parameters are consistent with abnormal left ventricular relaxation (grade 1 diastolic dysfunction).  ------------------------------------------------------------------- Aortic valve: Trileaflet; mildly calcified leaflets. Doppler: There was mild stenosis. Probably moderate aortic regurgitation. I do not see holodiastolic flow reversal in the descending thoracic aorta doppler pattern. VTI ratio of LVOT to aortic valve: 0.33. Valve area (VTI): 1.26 cm^2. Indexed valve area (VTI): 0.67 cm^2/m^2. Valve area (Vmax): 1.25 cm^2. Indexed valve area (Vmax): 0.66 cm^2/m^2. Mean gradient (S): 11 mm Hg. Peak gradient (S): 22 mm Hg.  ------------------------------------------------------------------- Aorta: Aortic root: The aortic root was normal in size. Ascending aorta: The ascending aorta was dilated.  ------------------------------------------------------------------- Mitral valve: Mildly calcified annulus. Normal thickness leaflets . Doppler: There was no evidence for stenosis. There was  mild regurgitation. Peak gradient (D): 2 mm Hg.  ------------------------------------------------------------------- Left atrium: The atrium was mildly dilated.  ------------------------------------------------------------------- Right ventricle: The cavity size was normal. Systolic function was normal.  ------------------------------------------------------------------- Pulmonic valve: Structurally normal valve. Cusp separation was normal. Doppler: Transvalvular velocity was within the normal range. There was no regurgitation.  ------------------------------------------------------------------- Tricuspid valve: Doppler: There was trivial regurgitation.  ------------------------------------------------------------------- Right atrium: The atrium was normal in size.  ------------------------------------------------------------------- Pericardium: There was no pericardial effusion.  ------------------------------------------------------------------- Systemic veins: Inferior vena cava: The vessel was normal in size. The respirophasic diameter changes were in the normal range (>= 50%), consistent with normal central venous pressure.  ------------------------------------------------------------------- Prepared and Electronically Authenticated by  Loralie Champagne, M.D. 2015-06-24T14:19:27  ------------------------------------------------------------------- Measurements  Left ventricle Value Reference LV ID, ED, PLAX chordal (H)  55.9 mm 43 - 52 LV ID, ES, PLAX chordal (H) 45.7 mm 23 - 38 LV fx shortening, PLAX chordal (L) 18 % >=29 LV PW thickness, ED 10.4 mm --------- IVS/LV PW ratio, ED (N) 1.11 <=1.3 Stroke volume, 2D 65 ml --------- Stroke volume/bsa, 2D 34 ml/m^2 --------- LV e&', lateral 8.29 cm/s --------- LV E/e&', lateral 9.53 --------- LV e&', medial 5.36 cm/s --------- LV E/e&', medial 14.74 --------- LV e&', average 6.83 cm/s --------- LV E/e&', average 11.58  ---------  Ventricular septum Value Reference IVS thickness, ED 11.5 mm ---------  LVOT Value Reference LVOT ID, S 22 mm --------- LVOT area 3.8 cm^2 --------- LVOT ID 22 mm --------- LVOT VTI, S 17.2 cm --------- Stroke volume (SV), LVOT DP 65.4 ml --------- Stroke index (SV/bsa), LVOT DP 34.6 ml/m^2 ---------  Aortic valve Value Reference Aortic valve peak velocity, S 236 cm/s --------- Aortic valve mean velocity, S 154 cm/s --------- Aortic valve VTI, S 51.8 cm --------- Aortic mean gradient, S 11 mm Hg --------- Aortic peak gradient, S 22 mm Hg --------- VTI ratio, LVOT/AV 0.33 --------- Aortic valve area, VTI 1.26 cm^2 --------- Aortic valve area/bsa, VTI 0.67 cm^2/m^2 --------- Aortic valve area, peak velocity 1.25 cm^2 --------- Aortic valve area/bsa, peak 0.66 cm^2/m^2 --------- velocity Aortic regurg pressure half-time 325 ms ---------  Aorta Value Reference Aortic root ID, ED 35 mm --------- Ascending aorta ID, A-P, S 44 mm ---------  Left atrium Value Reference LA ID, A-P, ES 41 mm --------- LA ID/bsa, A-P (N) 2.17 cm/m^2 <=2.2  Mitral valve Value Reference Mitral E-wave peak velocity 79 cm/s --------- Mitral A-wave peak velocity 126 cm/s --------- Mitral deceleration time (H) 320 ms 150 - 230 Mitral peak gradient, D 2 mm Hg --------- Mitral E/A ratio, peak 0.6 ---------  Pulmonary arteries Value Reference PA pressure, S, DP (N) 21 mm Hg <=30  Tricuspid valve Value Reference Tricuspid regurg peak velocity 213 cm/s --------- Tricuspid peak RV-RA gradient 18 mm Hg --------- Tricuspid maximal regurg 213 cm/s --------- velocity, PISA  Systemic veins Value Reference Estimated CVP 3 mm Hg ---------  Right ventricle Value Reference RV pressure, S, DP (N) 21 mm Hg <=30 RV s&', lateral, S 12.1 cm/s ---------  Legend: (L) and (H) mark values outside specified reference range.  (N) marks values inside specified reference range.      Transesophageal  Echocardiography  (Report amended )  Patient: Kriston, Mckinnie MR #: [id number] Study Date: 10/04/2013 Gender: F Age: 44 Height: 154.9 cm Weight: 77.3 kg BSA: 1.86 m^2 Pt. Status: Room:  ADMITTING Fransico Him, MD ATTENDING Fransico Him, MD ORDERING Fransico Him, MD PERFORMING Fransico Him, MD REFERRING Fransico Him, MD SONOGRAPHER Joanie Coddington, RDCS  cc:  ------------------------------------------------------------------- LV EF: 30% - 35%  ------------------------------------------------------------------- Indications: Aortic insufficiency 424.1.  ------------------------------------------------------------------- Study Conclusions  - Left ventricle: The cavity size was mildly dilated. Systolic function was moderately to severely reduced. The estimated ejection fraction was in the range of 30% to 35%. Wall motion was normal; there were no regional wall motion abnormalities. - Aortic valve: The AV is trileaflet and mildly thickened. In the 105 degree view the there is a density that intermittently becomes visible with leaflet coaptation but is not seen at all times and not seen any any other views. Unlikely to represent vegetation. There is a long eccentric jet of AR Mild thickening. There was severe regurgitation directed eccentrically in the LVOT. - Mitral valve: There was mild regurgitation. - Left atrium: The atrium was mildly dilated. No  evidence of thrombus in the atrial cavity or appendage. - Right atrium: No evidence of thrombus in the atrial cavity or appendage.  Diagnostic transesophageal echocardiography. 2D and color Doppler. Birthdate: Patient birthdate: 1946/02/15. Age: Patient is 68 yr old. Sex: Gender: female. Height: Height: 154.9 cm. Height: 61 in. Weight: Weight: 77.3 kg. Weight: 170 lb. Body mass index: BMI: 32.2 kg/m^2. Body surface area: BSA: 1.86 m^2. Blood pressure: 186/68 Patient status: Outpatient. Study date: Study date: 10/04/2013.  Study time: 11:14 AM. Location: Endoscopy.  -------------------------------------------------------------------  ------------------------------------------------------------------- Left ventricle: The cavity size was mildly dilated. Systolic function was moderately to severely reduced. The estimated ejection fraction was in the range of 30% to 35%. Wall motion was normal; there were no regional wall motion abnormalities.  ------------------------------------------------------------------- Aortic valve: The AV is trileaflet and mildly thickened. In the 105 degree view the there is a density that intermittently becomes visible with leaflet coaptation but is not seen at all times and not seen any any other views. Unlikely to represent vegetation. There is a long eccentric jet of AR Trileaflet. Mild thickening. Cusp separation was normal. Doppler: There was severe regurgitation directed eccentrically in the LVOT.  ------------------------------------------------------------------- Aorta: The aorta was mildly dilated. There was no atheroma. There was no evidence for dissection. Aortic root: The aortic root was not dilated. Ascending aorta: The ascending aorta was normal in size. Aortic arch: The aortic arch was normal in size. Descending aorta: The descending aorta was normal in size.  ------------------------------------------------------------------- Mitral valve: Structurally normal valve. Leaflet separation was normal. Doppler: There was mild regurgitation.  ------------------------------------------------------------------- Left atrium: The atrium was mildly dilated. No evidence of thrombus in the atrial cavity or appendage. The appendage was morphologically a left appendage, multilobulated, and of normal size. Emptying velocity was normal.  ------------------------------------------------------------------- Right ventricle: The cavity size was normal. Wall thickness  was normal. Systolic function was normal.  ------------------------------------------------------------------- Pulmonic valve: Structurally normal valve. Doppler: There was trivial regurgitation.  ------------------------------------------------------------------- Tricuspid valve: Structurally normal valve. Leaflet separation was normal. Doppler: There was trivial regurgitation.  ------------------------------------------------------------------- Pulmonary artery: The main pulmonary artery was normal-sized.  ------------------------------------------------------------------- Right atrium: The atrium was normal in size. No evidence of thrombus in the atrial cavity or appendage. The appendage was morphologically a right appendage.  ------------------------------------------------------------------- Pericardium: There was no pericardial effusion.  ------------------------------------------------------------------- Post procedure conclusions Ascending Aorta:  - The aorta was mildly dilated.  ------------------------------------------------------------------- Michaelle Birks, MD 2015-07-02T03:39:20           Cardiac Catheterization Procedure Note    Name: ALLETTA MATTOS   MRN: [id number]   DOB: [birth year]   Procedure: Right Heart Cath, Left Heart Cath, Selective Coronary Angiography, LV angiography, aortic root angiography   Indication: Aortic valve insufficiency, cardiomyopathy   Procedural Details: The right wrist was prepped, draped, and anesthetized with 1% lidocaine. Using the modified Seldinger technique a 5/6 French sheath was placed in the right radial artery and a 5/6 French sheath was placed in the right antecubital vein. A Swan-Ganz catheter was used for the right heart catheterization. Standard protocol was followed for recording of right heart pressures and sampling of oxygen saturations. Fick cardiac output was calculated. Standard Judkins catheters were used  for selective coronary angiography, aortic root angiography, and left ventriculography. There were no immediate procedural complications. The patient was transferred to the post catheterization recovery area for further monitoring.   Procedural Findings:   Hemodynamics   RA mean of 4   RV 27/6   PA 24/10 with  a mean of 17   PCWP mean of 8   LV 157/13   AO 146/66 with a mean 98   Oxygen saturations:   PA 70   AO 92   Cardiac Output (Fick) 5.5   Cardiac Index (Fick) 3.1   Coronary angiography:   Coronary dominance: right   Left mainstem: The left main is calcified. The proximal and mid left main are widely patent. The distal left main has 20-30% stenosis.   Left anterior descending (LAD): The LAD has mild calcification. The proximal vessel is widely patent with minor irregularity. The diagonal branches are widely patent. The mid LAD just after the second diagonal has a tight 90% stenosis. This is a focal lesion.   Left circumflex (LCx): The left circumflex is patent. The first obtuse marginal branch is widely patent with no obstruction noted.   Right coronary artery (RCA): The right coronary artery is dominant. The vessel has mild diffuse calcification. The mid vessel has 20-30% stenosis. There is minor irregularity noted throughout the proximal, mid, and distal vessel. The PDA and PLA branches are widely patent.   Left ventriculography: There is mild global cardiomyopathy noted with an LVEF estimated at about 40%. There is no significant mitral regurgitation.   Aortic root angiography: The proximal ascending aorta is dilated. There is severe aortic valve insufficiency noted.   Final Conclusions:   1. Severe mid LAD stenosis with otherwise minor nonobstructive CAD   2. Mild global LV systolic dysfunction   3. Dilated aortic root with severe aortic valve insufficiency   4. Normal intracardiac hemodynamics   Sherren Mocha   10/05/2013, 5:38 PM     Cardiac Gated CT  TECHNIQUE:   The  patient was scanned on a Philips 256 scanner. A 120 kV   retrospective scan was triggered in the descending thoracic aorta at   111 HU's. Gantry rotation speed was 270 msecs and collimation was .9   mm. No beta blockade or nitro were given. The 3D data set was   reconstructed in 10% intervals of the R-R cycle. Systolic and   diastolic phases were analyzed on a dedicated work station using   MPR, MIP and VRT modes. The patient received 80 cc of contrast.   FINDINGS:   Aortic Valve: Trileaflet No significant calcification Thickened   leaflet tips. Appears to be a large area of central malcoaptation in   diastole   Ascending Aorta:   Short Axis: 38.1 mm   Long Axis: 39.6 mm   Average: 39 mm   Sinotubular Junction:   Short Axis: 34 mm   Long Axis: 36.7 mm   Average: 35.3 mm   Sinus of Valsalva:   Short Axis: 35.8 mm   Long Axis: 39.5 mm   Average 37.6 mm   Descending Thoracic Aorta: 25 mm   The great vessels arise normally from the arch. There is mild   calcification of the arch and descending thoracic aorta. There is no   significant atheroma The left subclavian is   Calcium Score: 198 mostly in the LAD and circumflex   Coronary Arteries: Right dominant with normal origins. LM- calcified   nonobstructive plaque LAD >75% lesion just distal to D2 Circumflex   and RCA without obstructive disease   IMPRESSION:   1) Calcium Score 198 67th percentile for age and sex matched   controls   2) Single vessel obstructive CAD in distal LAD after D2   3) Noncalcified trileaflet aortic valve with large area  of   malcoaptation in diastole   4) Aortic Root dilatation with maximal diameter at RPA of 39 mm   5) Normal origin of the great vessels with mild calcific plaque in   the arch and tortuous left subclavian artery and patent LIMA   Jenkins Rouge   :   Medications: None   Electronically Signed:   By: Jenkins Rouge M.D.   On: 10/19/2013 17:21     Halina Andreas, MD Thu Oct 20, 2013  12:00:17 PM EDT              ADDENDUM REPORT: 10/20/2013 11:57    ADDENDUM:  OVER-READ INTERPRETATION CT CHEST   The following report is an over-read performed by radiologist Dr.   Alvino Blood [facility] on Creation date. This   over-read does not include interpretation of cardiac or coronary   anatomy or pathology. The CTA interpretation by the cardiologist is   attached.   Review of the pulmonary parenchyma demonstrates a 4 mm nodule in the   right upper lobe (image 47, series 202). There is mild reticular   pattern at the lung bases   There is no mediastinal lymphadenopathy evident. Limited view of the   upper abdomen is unremarkable. Limited view of the skeleton is   unremarkable.   IMPRESSION:   Single right upper lobe 4 mm pulmonary nodule.   If the patient is at high risk for bronchogenic carcinoma, follow-up   chest CT at 1 year is recommended. If the patient is at low risk, no   follow-up is needed. This recommendation follows the consensus   statement: Guidelines for Management of Small Pulmonary Nodules   Detected on CT Scans: A Statement from the [place] as   published in Radiology 2005; 237:395-400.   These results will be called to the ordering clinician or   representative by the Radiologist Assistant, and communication   documented in the PACS or zVision Dashboard.   Electronically Signed   By: Suzy Bouchard M.D.   On: 10/20/2013 11:57       Impression:  Patient has stage D severe symptomatic aortic insufficiency with single-vessel coronary artery disease involving high-grade stenosis of the mid left anterior descending coronary artery. She has mild to moderate global left ventricular systolic dysfunction and she presents with symptoms of exertional shortness of breath consistent with chronic combined systolic and diastolic congestive heart failure, New York Heart Association functional class II. CT angiogram confirmed the  presence of aneurysmal enlargement of the aortic root. 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 I feel that she would best be treated with elective aortic root replacement and single-vessel coronary artery bypass grafting.      Plan:  I have again reviewed the indications, risks, and potential benefits of surgery at length with the patient and her wife here in the office today. Alternative treatment strategies been discussed.  They understand and accept all potential risks of surgery including but not limited to risk of death, stroke or other neurologic complication, myocardial infarction, congestive heart failure, respiratory failure, renal failure, bleeding requiring transfusion and/or reexploration, arrhythmia, infection or other wound complications, pneumonia, pleural and/or pericardial effusion, pulmonary embolus, aortic dissection or other major vascular complication, or delayed complications related to valve repair or replacement including but not limited to structural valve deterioration and failure, thrombosis, embolization, endocarditis, or paravalvular  leak.  All of their questions have been answered.  I have instructed her to stop taking Altace.    Valentina Gu. Roxy Manns, MD 10/24/2013 11:14 AM

## 2013-10-26 ENCOUNTER — Encounter (HOSPITAL_COMMUNITY): Payer: Self-pay | Admitting: Critical Care Medicine

## 2013-10-26 MED ORDER — MAGNESIUM SULFATE 50 % IJ SOLN
40.0000 meq | INTRAMUSCULAR | Status: DC
  Filled 2013-10-26: qty 10

## 2013-10-26 MED ORDER — SODIUM CHLORIDE 0.9 % IV SOLN
INTRAVENOUS | Status: AC
  Administered 2013-10-27: 1 [IU]/h via INTRAVENOUS
  Filled 2013-10-26: qty 1

## 2013-10-26 MED ORDER — DEXTROSE 5 % IV SOLN
1.5000 g | INTRAVENOUS | Status: AC
  Administered 2013-10-27: 1.5 g via INTRAVENOUS
  Administered 2013-10-27: .75 g via INTRAVENOUS
  Filled 2013-10-26: qty 1.5

## 2013-10-26 MED ORDER — VANCOMYCIN HCL 1000 MG IV SOLR
INTRAVENOUS | Status: AC
  Administered 2013-10-27: 08:00:00
  Filled 2013-10-26: qty 1000

## 2013-10-26 MED ORDER — DOPAMINE-DEXTROSE 3.2-5 MG/ML-% IV SOLN
2.0000 ug/kg/min | INTRAVENOUS | Status: AC
  Administered 2013-10-27: 3 ug/kg/min via INTRAVENOUS
  Filled 2013-10-26: qty 250

## 2013-10-26 MED ORDER — DEXTROSE 5 % IV SOLN
750.0000 mg | INTRAVENOUS | Status: DC
  Filled 2013-10-26: qty 750

## 2013-10-26 MED ORDER — SODIUM CHLORIDE 0.9 % IV SOLN
INTRAVENOUS | Status: AC
  Administered 2013-10-27: 69.8 mL/h via INTRAVENOUS
  Filled 2013-10-26: qty 40

## 2013-10-26 MED ORDER — PLASMA-LYTE 148 IV SOLN
INTRAVENOUS | Status: AC
  Administered 2013-10-27: 08:00:00
  Filled 2013-10-26: qty 2.5

## 2013-10-26 MED ORDER — POTASSIUM CHLORIDE 2 MEQ/ML IV SOLN
80.0000 meq | INTRAVENOUS | Status: DC
  Filled 2013-10-26: qty 40

## 2013-10-26 MED ORDER — EPINEPHRINE HCL 1 MG/ML IJ SOLN
0.5000 ug/min | INTRAMUSCULAR | Status: DC
  Filled 2013-10-26: qty 4

## 2013-10-26 MED ORDER — DEXMEDETOMIDINE HCL IN NACL 400 MCG/100ML IV SOLN
0.1000 ug/kg/h | INTRAVENOUS | Status: AC
  Administered 2013-10-27: 0.2 ug/kg/h via INTRAVENOUS
  Filled 2013-10-26: qty 100

## 2013-10-26 MED ORDER — VANCOMYCIN HCL 10 G IV SOLR
1250.0000 mg | INTRAVENOUS | Status: AC
  Administered 2013-10-27: 1250 mg via INTRAVENOUS
  Filled 2013-10-26: qty 1250

## 2013-10-26 MED ORDER — PHENYLEPHRINE HCL 10 MG/ML IJ SOLN
30.0000 ug/min | INTRAVENOUS | Status: AC
  Administered 2013-10-27: 15 ug/min via INTRAVENOUS
  Filled 2013-10-26: qty 2

## 2013-10-26 MED ORDER — NITROGLYCERIN IN D5W 200-5 MCG/ML-% IV SOLN
2.0000 ug/min | INTRAVENOUS | Status: DC
  Filled 2013-10-26: qty 250

## 2013-10-26 MED ORDER — SODIUM CHLORIDE 0.9 % IV SOLN
INTRAVENOUS | Status: DC
  Filled 2013-10-26: qty 30

## 2013-10-27 ENCOUNTER — Inpatient Hospital Stay (HOSPITAL_COMMUNITY): Payer: Medicare Other | Admitting: Critical Care Medicine

## 2013-10-27 ENCOUNTER — Encounter (HOSPITAL_COMMUNITY)
Admission: RE | Disposition: A | Discharge status: Home / Self Care | Payer: Medicare Other | Source: Ambulatory Visit | Attending: Thoracic Surgery (Cardiothoracic Vascular Surgery)

## 2013-10-27 ENCOUNTER — Encounter (HOSPITAL_COMMUNITY): Payer: Self-pay | Admitting: *Deleted

## 2013-10-27 ENCOUNTER — Inpatient Hospital Stay (HOSPITAL_COMMUNITY)
Admission: RE | Admit: 2013-10-27 | Discharge: 2013-10-31 | DRG: 220 | Disposition: A | Discharge status: Home / Self Care | Payer: Medicare Other | Source: Ambulatory Visit | Attending: Thoracic Surgery (Cardiothoracic Vascular Surgery) | Admitting: Thoracic Surgery (Cardiothoracic Vascular Surgery)

## 2013-10-27 ENCOUNTER — Encounter (HOSPITAL_COMMUNITY): Payer: Medicare Other | Admitting: Critical Care Medicine

## 2013-10-27 ENCOUNTER — Inpatient Hospital Stay (HOSPITAL_COMMUNITY): Payer: Medicare Other

## 2013-10-27 DIAGNOSIS — J9819 Other pulmonary collapse: Secondary | ICD-10-CM | POA: Diagnosis not present

## 2013-10-27 DIAGNOSIS — Z951 Presence of aortocoronary bypass graft: Secondary | ICD-10-CM

## 2013-10-27 DIAGNOSIS — I712 Thoracic aortic aneurysm, without rupture, unspecified: Secondary | ICD-10-CM | POA: Diagnosis present

## 2013-10-27 DIAGNOSIS — M129 Arthropathy, unspecified: Secondary | ICD-10-CM | POA: Diagnosis present

## 2013-10-27 DIAGNOSIS — I77810 Thoracic aortic ectasia: Secondary | ICD-10-CM | POA: Diagnosis present

## 2013-10-27 DIAGNOSIS — K5900 Constipation, unspecified: Secondary | ICD-10-CM | POA: Diagnosis not present

## 2013-10-27 DIAGNOSIS — I10 Essential (primary) hypertension: Secondary | ICD-10-CM | POA: Diagnosis present

## 2013-10-27 DIAGNOSIS — I428 Other cardiomyopathies: Secondary | ICD-10-CM | POA: Diagnosis present

## 2013-10-27 DIAGNOSIS — I5042 Chronic combined systolic (congestive) and diastolic (congestive) heart failure: Secondary | ICD-10-CM | POA: Diagnosis present

## 2013-10-27 DIAGNOSIS — Z953 Presence of xenogenic heart valve: Secondary | ICD-10-CM

## 2013-10-27 DIAGNOSIS — Z7982 Long term (current) use of aspirin: Secondary | ICD-10-CM

## 2013-10-27 DIAGNOSIS — D6959 Other secondary thrombocytopenia: Secondary | ICD-10-CM | POA: Diagnosis not present

## 2013-10-27 DIAGNOSIS — E876 Hypokalemia: Secondary | ICD-10-CM | POA: Diagnosis not present

## 2013-10-27 DIAGNOSIS — I359 Nonrheumatic aortic valve disorder, unspecified: Secondary | ICD-10-CM | POA: Diagnosis not present

## 2013-10-27 DIAGNOSIS — I509 Heart failure, unspecified: Secondary | ICD-10-CM | POA: Diagnosis present

## 2013-10-27 DIAGNOSIS — E119 Type 2 diabetes mellitus without complications: Secondary | ICD-10-CM | POA: Diagnosis present

## 2013-10-27 DIAGNOSIS — I2510 Atherosclerotic heart disease of native coronary artery without angina pectoris: Secondary | ICD-10-CM | POA: Diagnosis present

## 2013-10-27 DIAGNOSIS — I080 Rheumatic disorders of both mitral and aortic valves: Secondary | ICD-10-CM | POA: Diagnosis present

## 2013-10-27 DIAGNOSIS — E7800 Pure hypercholesterolemia, unspecified: Secondary | ICD-10-CM | POA: Diagnosis present

## 2013-10-27 DIAGNOSIS — D62 Acute posthemorrhagic anemia: Secondary | ICD-10-CM | POA: Diagnosis not present

## 2013-10-27 DIAGNOSIS — I079 Rheumatic tricuspid valve disease, unspecified: Secondary | ICD-10-CM | POA: Diagnosis present

## 2013-10-27 DIAGNOSIS — M109 Gout, unspecified: Secondary | ICD-10-CM | POA: Diagnosis present

## 2013-10-27 DIAGNOSIS — K219 Gastro-esophageal reflux disease without esophagitis: Secondary | ICD-10-CM | POA: Diagnosis present

## 2013-10-27 DIAGNOSIS — I429 Cardiomyopathy, unspecified: Secondary | ICD-10-CM | POA: Diagnosis present

## 2013-10-27 DIAGNOSIS — I351 Nonrheumatic aortic (valve) insufficiency: Secondary | ICD-10-CM | POA: Diagnosis present

## 2013-10-27 DIAGNOSIS — I5032 Chronic diastolic (congestive) heart failure: Secondary | ICD-10-CM | POA: Diagnosis present

## 2013-10-27 HISTORY — PX: ASCENDING AORTIC ROOT REPLACEMENT: SHX5729

## 2013-10-27 HISTORY — DX: Presence of aortocoronary bypass graft: Z95.1

## 2013-10-27 HISTORY — PX: AORTIC VALVE REPLACEMENT: SHX41

## 2013-10-27 HISTORY — PX: CORONARY ARTERY BYPASS GRAFT: SHX141

## 2013-10-27 HISTORY — DX: Presence of xenogenic heart valve: Z95.3

## 2013-10-27 HISTORY — PX: INTRAOPERATIVE TRANSESOPHAGEAL ECHOCARDIOGRAM: SHX5062

## 2013-10-27 LAB — POCT I-STAT, CHEM 8
BUN: 11 mg/dL (ref 6–23)
BUN: 10 mg/dL (ref 6–23)
BUN: 9 mg/dL (ref 6–23)
BUN: 9 mg/dL (ref 6–23)
BUN: 9 mg/dL (ref 6–23)
BUN: 7 mg/dL (ref 6–23)
Calcium, Ion: 1.28 mmol/L (ref 1.13–1.30)
Calcium, Ion: 1 mmol/L — ABNORMAL LOW (ref 1.13–1.30)
Calcium, Ion: 1.2 mmol/L (ref 1.13–1.30)
Calcium, Ion: 1.06 mmol/L — ABNORMAL LOW (ref 1.13–1.30)
Calcium, Ion: 1.02 mmol/L — ABNORMAL LOW (ref 1.13–1.30)
Calcium, Ion: 1.08 mmol/L — ABNORMAL LOW (ref 1.13–1.30)
Chloride: 100 mEq/L (ref 96–112)
Chloride: 104 mEq/L (ref 96–112)
Chloride: 92 mEq/L — ABNORMAL LOW (ref 96–112)
Chloride: 97 mEq/L (ref 96–112)
Chloride: 99 mEq/L (ref 96–112)
Chloride: 109 mEq/L (ref 96–112)
Creatinine, Ser: 0.5 mg/dL (ref 0.50–1.10)
Creatinine, Ser: 0.5 mg/dL (ref 0.50–1.10)
Creatinine, Ser: 0.5 mg/dL (ref 0.50–1.10)
Creatinine, Ser: 0.4 mg/dL — ABNORMAL LOW (ref 0.50–1.10)
Creatinine, Ser: 0.5 mg/dL (ref 0.50–1.10)
Creatinine, Ser: 0.5 mg/dL (ref 0.50–1.10)
Glucose, Bld: 126 mg/dL — ABNORMAL HIGH (ref 70–99)
Glucose, Bld: 103 mg/dL — ABNORMAL HIGH (ref 70–99)
Glucose, Bld: 121 mg/dL — ABNORMAL HIGH (ref 70–99)
Glucose, Bld: 133 mg/dL — ABNORMAL HIGH (ref 70–99)
Glucose, Bld: 193 mg/dL — ABNORMAL HIGH (ref 70–99)
Glucose, Bld: 128 mg/dL — ABNORMAL HIGH (ref 70–99)
HCT: 38 % (ref 36.0–46.0)
HCT: 25 % — ABNORMAL LOW (ref 36.0–46.0)
HCT: 35 % — ABNORMAL LOW (ref 36.0–46.0)
HCT: 27 % — ABNORMAL LOW (ref 36.0–46.0)
HCT: 26 % — ABNORMAL LOW (ref 36.0–46.0)
HCT: 34 % — ABNORMAL LOW (ref 36.0–46.0)
Hemoglobin: 12.9 g/dL (ref 12.0–15.0)
Hemoglobin: 11.9 g/dL — ABNORMAL LOW (ref 12.0–15.0)
Hemoglobin: 8.5 g/dL — ABNORMAL LOW (ref 12.0–15.0)
Hemoglobin: 9.2 g/dL — ABNORMAL LOW (ref 12.0–15.0)
Hemoglobin: 8.8 g/dL — ABNORMAL LOW (ref 12.0–15.0)
Hemoglobin: 11.6 g/dL — ABNORMAL LOW (ref 12.0–15.0)
Potassium: 4.3 mEq/L (ref 3.7–5.3)
Potassium: 3.9 mEq/L (ref 3.7–5.3)
Potassium: 4.1 mEq/L (ref 3.7–5.3)
Potassium: 5.6 mEq/L — ABNORMAL HIGH (ref 3.7–5.3)
Potassium: 4.3 mEq/L (ref 3.7–5.3)
Potassium: 4.4 mEq/L (ref 3.7–5.3)
Sodium: 140 mEq/L (ref 137–147)
Sodium: 132 mEq/L — ABNORMAL LOW (ref 137–147)
Sodium: 139 mEq/L (ref 137–147)
Sodium: 135 mEq/L — ABNORMAL LOW (ref 137–147)
Sodium: 140 mEq/L (ref 137–147)
Sodium: 142 mEq/L (ref 137–147)
TCO2: 25 mmol/L (ref 0–100)
TCO2: 24 mmol/L (ref 0–100)
TCO2: 25 mmol/L (ref 0–100)
TCO2: 24 mmol/L (ref 0–100)
TCO2: 22 mmol/L (ref 0–100)
TCO2: 20 mmol/L (ref 0–100)

## 2013-10-27 LAB — CBC
HCT: 36 % (ref 36.0–46.0)
HCT: 34 % — ABNORMAL LOW (ref 36.0–46.0)
Hemoglobin: 12.4 g/dL (ref 12.0–15.0)
Hemoglobin: 11.7 g/dL — ABNORMAL LOW (ref 12.0–15.0)
MCH: 34 pg (ref 26.0–34.0)
MCH: 34.5 pg — ABNORMAL HIGH (ref 26.0–34.0)
MCHC: 34.4 g/dL (ref 30.0–36.0)
MCHC: 34.4 g/dL (ref 30.0–36.0)
MCV: 98.6 fL (ref 78.0–100.0)
MCV: 100.3 fL — ABNORMAL HIGH (ref 78.0–100.0)
Platelets: 111 10*3/uL — ABNORMAL LOW (ref 150–400)
Platelets: 104 10*3/uL — ABNORMAL LOW (ref 150–400)
RBC: 3.65 MIL/uL — ABNORMAL LOW (ref 3.87–5.11)
RBC: 3.39 MIL/uL — ABNORMAL LOW (ref 3.87–5.11)
RDW: 12.4 % (ref 11.5–15.5)
RDW: 12.7 % (ref 11.5–15.5)
WBC: 15.5 10*3/uL — ABNORMAL HIGH (ref 4.0–10.5)
WBC: 14.3 10*3/uL — ABNORMAL HIGH (ref 4.0–10.5)

## 2013-10-27 LAB — PROTIME-INR
INR: 1.49 (ref 0.00–1.49)
Prothrombin Time: 18 seconds — ABNORMAL HIGH (ref 11.6–15.2)

## 2013-10-27 LAB — POCT I-STAT 3, ART BLOOD GAS (G3+)
Acid-base deficit: 1 mmol/L (ref 0.0–2.0)
Acid-base deficit: 3 mmol/L — ABNORMAL HIGH (ref 0.0–2.0)
Acid-base deficit: 4 mmol/L — ABNORMAL HIGH (ref 0.0–2.0)
Bicarbonate: 23.9 mEq/L (ref 20.0–24.0)
Bicarbonate: 24.4 mEq/L — ABNORMAL HIGH (ref 20.0–24.0)
Bicarbonate: 21.5 mEq/L (ref 20.0–24.0)
Bicarbonate: 22.6 mEq/L (ref 20.0–24.0)
O2 Saturation: 100 %
O2 Saturation: 98 %
O2 Saturation: 96 %
O2 Saturation: 97 %
Patient temperature: 36
Patient temperature: 36.9
Patient temperature: 37
TCO2: 25 mmol/L (ref 0–100)
TCO2: 26 mmol/L (ref 0–100)
TCO2: 23 mmol/L (ref 0–100)
TCO2: 24 mmol/L (ref 0–100)
pCO2 arterial: 36.6 mmHg (ref 35.0–45.0)
pCO2 arterial: 42.8 mmHg (ref 35.0–45.0)
pCO2 arterial: 39 mmHg (ref 35.0–45.0)
pCO2 arterial: 40.4 mmHg (ref 35.0–45.0)
pH, Arterial: 7.423 (ref 7.350–7.450)
pH, Arterial: 7.359 (ref 7.350–7.450)
pH, Arterial: 7.35 (ref 7.350–7.450)
pH, Arterial: 7.355 (ref 7.350–7.450)
pO2, Arterial: 346 mmHg — ABNORMAL HIGH (ref 80.0–100.0)
pO2, Arterial: 110 mmHg — ABNORMAL HIGH (ref 80.0–100.0)
pO2, Arterial: 88 mmHg (ref 80.0–100.0)
pO2, Arterial: 94 mmHg (ref 80.0–100.0)

## 2013-10-27 LAB — GLUCOSE, CAPILLARY
Glucose-Capillary: 131 mg/dL — ABNORMAL HIGH (ref 70–99)
Glucose-Capillary: 98 mg/dL (ref 70–99)
Glucose-Capillary: 80 mg/dL (ref 70–99)
Glucose-Capillary: 126 mg/dL — ABNORMAL HIGH (ref 70–99)
Glucose-Capillary: 150 mg/dL — ABNORMAL HIGH (ref 70–99)

## 2013-10-27 LAB — POCT I-STAT 4, (NA,K, GLUC, HGB,HCT)
Glucose, Bld: 136 mg/dL — ABNORMAL HIGH (ref 70–99)
HCT: 37 % (ref 36.0–46.0)
Hemoglobin: 12.6 g/dL (ref 12.0–15.0)
Potassium: 3.8 mEq/L (ref 3.7–5.3)
Sodium: 141 mEq/L (ref 137–147)

## 2013-10-27 LAB — CREATININE, SERUM
Creatinine, Ser: 0.57 mg/dL (ref 0.50–1.10)
GFR calc Af Amer: >90 mL/min (ref >90)
GFR calc non Af Amer: >90 mL/min (ref >90)

## 2013-10-27 LAB — MAGNESIUM: Magnesium: 3.1 mg/dL — ABNORMAL HIGH (ref 1.5–2.5)

## 2013-10-27 LAB — PLATELET COUNT: Platelets: 121 10*3/uL — ABNORMAL LOW (ref 150–400)

## 2013-10-27 LAB — HEMOGLOBIN AND HEMATOCRIT, BLOOD
HCT: 28.1 % — ABNORMAL LOW (ref 36.0–46.0)
Hemoglobin: 9.8 g/dL — ABNORMAL LOW (ref 12.0–15.0)

## 2013-10-27 LAB — APTT: aPTT: 40 seconds — ABNORMAL HIGH (ref 24–37)

## 2013-10-27 LAB — PREPARE RBC (CROSSMATCH)

## 2013-10-27 SURGERY — REPLACEMENT, AORTIC VALVE, OPEN
Anesthesia: General | Site: Chest

## 2013-10-27 MED ORDER — METOPROLOL TARTRATE 25 MG/10 ML ORAL SUSPENSION
12.5000 mg | Freq: Two times a day (BID) | ORAL | Status: DC
  Filled 2013-10-27 (×3): qty 5

## 2013-10-27 MED ORDER — SODIUM CHLORIDE 0.9 % IJ SOLN
INTRAMUSCULAR | Status: DC | PRN
  Administered 2013-10-27 (×3): via TOPICAL

## 2013-10-27 MED ORDER — EPHEDRINE SULFATE 50 MG/ML IJ SOLN
INTRAMUSCULAR | Status: DC | PRN
  Administered 2013-10-27: 5 mg via INTRAVENOUS

## 2013-10-27 MED ORDER — SODIUM CHLORIDE 0.9 % IV SOLN
INTRAVENOUS | Status: AC
  Administered 2013-10-28: 2 [IU]/h via INTRAVENOUS
  Filled 2013-10-27 (×2): qty 1

## 2013-10-27 MED ORDER — VANCOMYCIN HCL IN DEXTROSE 1-5 GM/200ML-% IV SOLN
1000.0000 mg | Freq: Once | INTRAVENOUS | Status: AC
Start: 2013-10-27 — End: 2013-10-27
  Administered 2013-10-27: 1000 mg via INTRAVENOUS
  Filled 2013-10-27: qty 200

## 2013-10-27 MED ORDER — STERILE WATER FOR INJECTION IJ SOLN
INTRAMUSCULAR | Status: AC
  Filled 2013-10-27: qty 10

## 2013-10-27 MED ORDER — PHENYLEPHRINE HCL 10 MG/ML IJ SOLN
0.0000 ug/min | INTRAVENOUS | Status: DC
  Filled 2013-10-27: qty 2

## 2013-10-27 MED ORDER — ASPIRIN EC 325 MG PO TBEC
325.0000 mg | DELAYED_RELEASE_TABLET | Freq: Every day | ORAL | Status: DC
  Filled 2013-10-27: qty 1

## 2013-10-27 MED ORDER — METOPROLOL TARTRATE 12.5 MG HALF TABLET
12.5000 mg | ORAL_TABLET | Freq: Two times a day (BID) | ORAL | Status: DC
  Filled 2013-10-27 (×3): qty 1

## 2013-10-27 MED ORDER — ACETAMINOPHEN 650 MG RE SUPP
650.0000 mg | Freq: Once | RECTAL | Status: AC
  Administered 2013-10-27: 650 mg via RECTAL

## 2013-10-27 MED ORDER — PANTOPRAZOLE SODIUM 40 MG PO TBEC
40.0000 mg | DELAYED_RELEASE_TABLET | Freq: Every day | ORAL | Status: DC
  Administered 2013-10-29 – 2013-10-31 (×3): 40 mg via ORAL
  Filled 2013-10-27 (×4): qty 1

## 2013-10-27 MED ORDER — HEMOSTATIC AGENTS (NO CHARGE) OPTIME
TOPICAL | Status: DC | PRN
  Administered 2013-10-27: 1 via TOPICAL

## 2013-10-27 MED ORDER — POTASSIUM CHLORIDE 10 MEQ/50ML IV SOLN
10.0000 meq | INTRAVENOUS | Status: AC
  Administered 2013-10-27 (×3): 10 meq via INTRAVENOUS

## 2013-10-27 MED ORDER — ALBUMIN HUMAN 5 % IV SOLN
INTRAVENOUS | Status: DC | PRN
  Administered 2013-10-27 (×2): via INTRAVENOUS

## 2013-10-27 MED ORDER — SODIUM CHLORIDE 0.9 % IR SOLN
Status: DC | PRN
  Administered 2013-10-27: 6000 mL

## 2013-10-27 MED ORDER — FENTANYL CITRATE 0.05 MG/ML IJ SOLN
INTRAMUSCULAR | Status: AC
  Filled 2013-10-27: qty 5

## 2013-10-27 MED ORDER — DOCUSATE SODIUM 100 MG PO CAPS
200.0000 mg | ORAL_CAPSULE | Freq: Every day | ORAL | Status: DC
  Administered 2013-10-28 – 2013-10-31 (×4): 200 mg via ORAL
  Filled 2013-10-27 (×4): qty 2

## 2013-10-27 MED ORDER — ROCURONIUM BROMIDE 100 MG/10ML IV SOLN
INTRAVENOUS | Status: DC | PRN
  Administered 2013-10-27: 50 mg via INTRAVENOUS

## 2013-10-27 MED ORDER — LACTATED RINGERS IV SOLN
INTRAVENOUS | Status: DC | PRN
  Administered 2013-10-27 (×2): via INTRAVENOUS

## 2013-10-27 MED ORDER — DOPAMINE-DEXTROSE 3.2-5 MG/ML-% IV SOLN: 0.0000 ug/kg/min | INTRAVENOUS | Status: DC

## 2013-10-27 MED ORDER — SODIUM CHLORIDE 0.9 % IV SOLN
INTRAVENOUS | Status: DC
  Administered 2013-10-27: 20 mL/h via INTRAVENOUS

## 2013-10-27 MED ORDER — MIDAZOLAM HCL 5 MG/5ML IJ SOLN
INTRAMUSCULAR | Status: DC | PRN
  Administered 2013-10-27 (×2): 3 mg via INTRAVENOUS
  Administered 2013-10-27 (×2): 2 mg via INTRAVENOUS

## 2013-10-27 MED ORDER — VECURONIUM BROMIDE 10 MG IV SOLR
INTRAVENOUS | Status: AC
  Filled 2013-10-27: qty 10

## 2013-10-27 MED ORDER — MAGNESIUM SULFATE 4000MG/100ML IJ SOLN
4.0000 g | Freq: Once | INTRAMUSCULAR | Status: AC
Start: 2013-10-27 — End: 2013-10-27
  Administered 2013-10-27: 4 g via INTRAVENOUS
  Filled 2013-10-27: qty 100

## 2013-10-27 MED ORDER — PHENYLEPHRINE HCL 10 MG/ML IJ SOLN
10.0000 mg | INTRAVENOUS | Status: DC | PRN
  Administered 2013-10-27: 20 ug/min via INTRAVENOUS

## 2013-10-27 MED ORDER — LACTATED RINGERS IV SOLN: INTRAVENOUS | Status: DC

## 2013-10-27 MED ORDER — MORPHINE SULFATE 2 MG/ML IJ SOLN
2.0000 mg | INTRAMUSCULAR | Status: DC | PRN
  Administered 2013-10-27 (×2): 2 mg via INTRAVENOUS
  Filled 2013-10-27 (×2): qty 1

## 2013-10-27 MED ORDER — PHENYLEPHRINE 40 MCG/ML (10ML) SYRINGE FOR IV PUSH (FOR BLOOD PRESSURE SUPPORT)
PREFILLED_SYRINGE | INTRAVENOUS | Status: AC
  Filled 2013-10-27: qty 10

## 2013-10-27 MED ORDER — GLYCOPYRROLATE 0.2 MG/ML IJ SOLN
INTRAMUSCULAR | Status: DC | PRN
  Administered 2013-10-27 (×2): 0.1 mg via INTRAVENOUS

## 2013-10-27 MED ORDER — NITROGLYCERIN IN D5W 200-5 MCG/ML-% IV SOLN: 0.0000 ug/min | INTRAVENOUS | Status: DC

## 2013-10-27 MED ORDER — DEXTROSE 5 % IV SOLN
1.5000 g | Freq: Two times a day (BID) | INTRAVENOUS | Status: AC
  Administered 2013-10-27 – 2013-10-29 (×4): 1.5 g via INTRAVENOUS
  Filled 2013-10-27 (×4): qty 1.5

## 2013-10-27 MED ORDER — ARTIFICIAL TEARS OP OINT
TOPICAL_OINTMENT | OPHTHALMIC | Status: DC | PRN
  Administered 2013-10-27: 1 via OPHTHALMIC

## 2013-10-27 MED ORDER — PHENYLEPHRINE HCL 10 MG/ML IJ SOLN
INTRAMUSCULAR | Status: DC | PRN
  Administered 2013-10-27: 40 ug via INTRAVENOUS

## 2013-10-27 MED ORDER — DEXMEDETOMIDINE HCL IN NACL 200 MCG/50ML IV SOLN: 0.1000 ug/kg/h | INTRAVENOUS | Status: DC

## 2013-10-27 MED ORDER — ALBUMIN HUMAN 5 % IV SOLN
250.0000 mL | INTRAVENOUS | Status: AC | PRN
  Administered 2013-10-27: 250 mL via INTRAVENOUS

## 2013-10-27 MED ORDER — CHLORHEXIDINE GLUCONATE 4 % EX LIQD
30.0000 mL | CUTANEOUS | Status: DC
  Filled 2013-10-27: qty 30

## 2013-10-27 MED ORDER — METOPROLOL TARTRATE 1 MG/ML IV SOLN: 2.5000 mg | INTRAVENOUS | Status: DC | PRN

## 2013-10-27 MED ORDER — LACTATED RINGERS IV SOLN
INTRAVENOUS | Status: DC | PRN
  Administered 2013-10-27: 07:00:00 via INTRAVENOUS

## 2013-10-27 MED ORDER — BISACODYL 5 MG PO TBEC
10.0000 mg | DELAYED_RELEASE_TABLET | Freq: Every day | ORAL | Status: DC
  Administered 2013-10-28 – 2013-10-30 (×3): 10 mg via ORAL
  Filled 2013-10-27 (×3): qty 2

## 2013-10-27 MED ORDER — ONDANSETRON HCL 4 MG/2ML IJ SOLN
4.0000 mg | Freq: Four times a day (QID) | INTRAMUSCULAR | Status: DC | PRN
Start: 2013-10-27 — End: 2013-10-31

## 2013-10-27 MED ORDER — PROTAMINE SULFATE 10 MG/ML IV SOLN
INTRAVENOUS | Status: AC
  Filled 2013-10-27: qty 25

## 2013-10-27 MED ORDER — LACTATED RINGERS IV SOLN: 500.0000 mL | Freq: Once | INTRAVENOUS | Status: AC | PRN

## 2013-10-27 MED ORDER — PROPOFOL 10 MG/ML IV BOLUS
INTRAVENOUS | Status: DC | PRN
  Administered 2013-10-27: 60 mg via INTRAVENOUS
  Administered 2013-10-27: 100 mg via INTRAVENOUS

## 2013-10-27 MED ORDER — OXYCODONE HCL 5 MG PO TABS
5.0000 mg | ORAL_TABLET | ORAL | Status: DC | PRN
  Administered 2013-10-28 (×5): 10 mg via ORAL
  Administered 2013-10-29: 5 mg via ORAL
  Administered 2013-10-29 (×2): 10 mg via ORAL
  Administered 2013-10-30: 5 mg via ORAL
  Filled 2013-10-27 (×6): qty 2
  Filled 2013-10-27: qty 1
  Filled 2013-10-27 (×2): qty 2

## 2013-10-27 MED ORDER — SODIUM CHLORIDE 0.9 % IJ SOLN
3.0000 mL | Freq: Two times a day (BID) | INTRAMUSCULAR | Status: DC
  Administered 2013-10-28 (×2): 3 mL via INTRAVENOUS
  Administered 2013-10-29: 10 mL via INTRAVENOUS
  Administered 2013-10-30: 3 mL via INTRAVENOUS

## 2013-10-27 MED ORDER — FAMOTIDINE IN NACL 20-0.9 MG/50ML-% IV SOLN
20.0000 mg | Freq: Two times a day (BID) | INTRAVENOUS | Status: DC
  Administered 2013-10-27: 20 mg via INTRAVENOUS

## 2013-10-27 MED ORDER — MIDAZOLAM HCL 10 MG/2ML IJ SOLN
INTRAMUSCULAR | Status: AC
  Filled 2013-10-27: qty 2

## 2013-10-27 MED ORDER — PROPOFOL 10 MG/ML IV BOLUS
INTRAVENOUS | Status: AC
  Filled 2013-10-27: qty 20

## 2013-10-27 MED ORDER — SODIUM CHLORIDE 0.9 % IV SOLN
INTRAVENOUS | Status: AC
  Administered 2013-10-27: 100 mL/h via INTRAVENOUS
  Administered 2013-10-28: 20 mL/h via INTRAVENOUS

## 2013-10-27 MED ORDER — LACTATED RINGERS IV SOLN
INTRAVENOUS | Status: DC | PRN
Start: 2013-10-27 — End: 2013-10-27
  Administered 2013-10-27: 07:00:00 via INTRAVENOUS

## 2013-10-27 MED ORDER — SODIUM CHLORIDE 0.9 % IV SOLN
INTRAVENOUS | Status: DC | PRN
  Administered 2013-10-27: 13:00:00 via INTRAVENOUS

## 2013-10-27 MED ORDER — FAMOTIDINE IN NACL 20-0.9 MG/50ML-% IV SOLN: 20.0000 mg | Freq: Two times a day (BID) | INTRAVENOUS | Status: DC

## 2013-10-27 MED ORDER — INSULIN REGULAR BOLUS VIA INFUSION
0.0000 [IU] | Freq: Three times a day (TID) | INTRAVENOUS | Status: DC
  Administered 2013-10-28: 5 [IU] via INTRAVENOUS
  Filled 2013-10-27: qty 10

## 2013-10-27 MED ORDER — SODIUM CHLORIDE 0.45 % IV SOLN
INTRAVENOUS | Status: DC
  Administered 2013-10-27: 20 mL/h via INTRAVENOUS

## 2013-10-27 MED ORDER — ACETAMINOPHEN 160 MG/5ML PO SOLN
650.0000 mg | Freq: Once | ORAL | Status: AC
Start: 2013-10-27 — End: 2013-10-27

## 2013-10-27 MED ORDER — HEPARIN SODIUM (PORCINE) 1000 UNIT/ML IJ SOLN
INTRAMUSCULAR | Status: DC | PRN
  Administered 2013-10-27: 20000 [IU] via INTRAVENOUS
  Administered 2013-10-27: 3000 [IU] via INTRAVENOUS

## 2013-10-27 MED ORDER — ASPIRIN 81 MG PO CHEW: 324.0000 mg | CHEWABLE_TABLET | Freq: Every day | ORAL | Status: DC

## 2013-10-27 MED ORDER — PROTAMINE SULFATE 10 MG/ML IV SOLN
INTRAVENOUS | Status: DC | PRN
  Administered 2013-10-27 (×4): 50 mg via INTRAVENOUS

## 2013-10-27 MED ORDER — MIDAZOLAM HCL 2 MG/2ML IJ SOLN
INTRAMUSCULAR | Status: AC
  Filled 2013-10-27: qty 2

## 2013-10-27 MED ORDER — FENTANYL CITRATE 0.05 MG/ML IJ SOLN
INTRAMUSCULAR | Status: DC | PRN
  Administered 2013-10-27: 250 ug via INTRAVENOUS
  Administered 2013-10-27: 100 ug via INTRAVENOUS
  Administered 2013-10-27: 250 ug via INTRAVENOUS
  Administered 2013-10-27: 150 ug via INTRAVENOUS
  Administered 2013-10-27: 250 ug via INTRAVENOUS
  Administered 2013-10-27: 100 ug via INTRAVENOUS
  Administered 2013-10-27: 250 ug via INTRAVENOUS
  Administered 2013-10-27: 150 ug via INTRAVENOUS
  Administered 2013-10-27: 250 ug via INTRAVENOUS

## 2013-10-27 MED ORDER — ACETAMINOPHEN 500 MG PO TABS
1000.0000 mg | ORAL_TABLET | Freq: Four times a day (QID) | ORAL | Status: DC
  Administered 2013-10-28 – 2013-10-31 (×12): 1000 mg via ORAL
  Filled 2013-10-27 (×20): qty 2

## 2013-10-27 MED ORDER — VECURONIUM BROMIDE 10 MG IV SOLR
INTRAVENOUS | Status: DC | PRN
  Administered 2013-10-27 (×2): 10 mg via INTRAVENOUS
  Administered 2013-10-27: 5 mg via INTRAVENOUS

## 2013-10-27 MED ORDER — MIDAZOLAM HCL 2 MG/2ML IJ SOLN: 2.0000 mg | INTRAMUSCULAR | Status: DC | PRN

## 2013-10-27 MED ORDER — HEPARIN SODIUM (PORCINE) 1000 UNIT/ML IJ SOLN
INTRAMUSCULAR | Status: AC
  Filled 2013-10-27: qty 1

## 2013-10-27 MED ORDER — SODIUM CHLORIDE 0.9 % IJ SOLN: 3.0000 mL | INTRAMUSCULAR | Status: DC | PRN

## 2013-10-27 MED ORDER — MORPHINE SULFATE 2 MG/ML IJ SOLN: 1.0000 mg | INTRAMUSCULAR | Status: AC | PRN

## 2013-10-27 MED ORDER — BISACODYL 10 MG RE SUPP: 10.0000 mg | Freq: Every day | RECTAL | Status: DC

## 2013-10-27 MED ORDER — SODIUM CHLORIDE 0.9 % IV SOLN: 250.0000 mL | INTRAVENOUS | Status: AC

## 2013-10-27 MED ORDER — ACETAMINOPHEN 160 MG/5ML PO SOLN: 1000.0000 mg | Freq: Four times a day (QID) | ORAL | Status: DC

## 2013-10-27 SURGICAL SUPPLY — 153 items
ADAPTER CARDIO PERF ANTE/RETRO (ADAPTER) ×3 IMPLANT
APPLICATOR TIP BIOGLUE STANDRD (MISCELLANEOUS) IMPLANT
APPLICATOR TIP COSEAL (VASCULAR PRODUCTS) IMPLANT
APPLIER CLIP 9.375 MED OPEN (MISCELLANEOUS)
APPLIER CLIP 9.375 SM OPEN (CLIP)
ATTRACTOMAT 16X20 MAGNETIC DRP (DRAPES) ×3 IMPLANT
BAG DECANTER FOR FLEXI CONT (MISCELLANEOUS) ×6 IMPLANT
BANDAGE ELASTIC 4 VELCRO ST LF (GAUZE/BANDAGES/DRESSINGS) ×3 IMPLANT
BANDAGE ELASTIC 6 VELCRO ST LF (GAUZE/BANDAGES/DRESSINGS) ×3 IMPLANT
BANDAGE GAUZE ELAST BULKY 4 IN (GAUZE/BANDAGES/DRESSINGS) ×3 IMPLANT
BASKET HEART (ORDER IN 25'S) (MISCELLANEOUS) ×1
BASKET HEART (ORDER IN 25S) (MISCELLANEOUS) ×2 IMPLANT
BENZOIN TINCTURE PRP APPL 2/3 (GAUZE/BANDAGES/DRESSINGS) IMPLANT
BLADE STERNUM SYSTEM 6 (BLADE) ×3 IMPLANT
BLADE SURG 11 STRL SS (BLADE) ×3 IMPLANT
BLADE SURG ROTATE 9660 (MISCELLANEOUS) IMPLANT
CABLE PACING FASLOC BIEGE (MISCELLANEOUS) ×3 IMPLANT
CABLE PACING FASLOC BLUE (MISCELLANEOUS) ×3 IMPLANT
CANISTER SUCTION 2500CC (MISCELLANEOUS) ×3 IMPLANT
CANNULA EZ GLIDE AORTIC 21FR (CANNULA) ×6 IMPLANT
CANNULA GUNDRY RCSP 15FR (MISCELLANEOUS) ×3 IMPLANT
CANNULA SOFTFLOW AORTIC 7M21FR (CANNULA) ×3 IMPLANT
CANNULA VENOUS LOW PROF 34X46 (CANNULA) ×3 IMPLANT
CARDIAC SUCTION (MISCELLANEOUS) ×3 IMPLANT
CATH CPB KIT OWEN (MISCELLANEOUS) ×3 IMPLANT
CATH HEART VENT LEFT (CATHETERS) ×2 IMPLANT
CATH ROBINSON RED A/P 18FR (CATHETERS) ×9 IMPLANT
CATH THORACIC 28FR (CATHETERS) IMPLANT
CATH THORACIC 28FR RT ANG (CATHETERS) IMPLANT
CATH THORACIC 36FR (CATHETERS) ×3 IMPLANT
CATH THORACIC 36FR RT ANG (CATHETERS) ×3 IMPLANT
CAUTERY SURG HI TEMP FINE TIP (MISCELLANEOUS) ×3 IMPLANT
CLIP APPLIE 9.375 MED OPEN (MISCELLANEOUS) IMPLANT
CLIP APPLIE 9.375 SM OPEN (CLIP) IMPLANT
CLIP FOGARTY SPRING 6M (CLIP) IMPLANT
CLIP RETRACTION 3.0MM CORONARY (MISCELLANEOUS) ×3 IMPLANT
CLIP TI MEDIUM 24 (CLIP) IMPLANT
CLIP TI WIDE RED SMALL 24 (CLIP) IMPLANT
CONN Y 3/8X3/8X3/8  BEN (MISCELLANEOUS)
CONN Y 3/8X3/8X3/8 BEN (MISCELLANEOUS) IMPLANT
CONT SPEC 4OZ CLIKSEAL STRL BL (MISCELLANEOUS) ×6 IMPLANT
COVER SURGICAL LIGHT HANDLE (MISCELLANEOUS) ×6 IMPLANT
CRADLE DONUT ADULT HEAD (MISCELLANEOUS) ×3 IMPLANT
DRAIN CHANNEL 32F RND 10.7 FF (WOUND CARE) ×3 IMPLANT
DRAPE BILATERAL SPLIT (DRAPES) IMPLANT
DRAPE CARDIOVASCULAR INCISE (DRAPES) ×1
DRAPE CV SPLIT W-CLR ANES SCRN (DRAPES) IMPLANT
DRAPE INCISE IOBAN 66X45 STRL (DRAPES) ×6 IMPLANT
DRAPE SLUSH/WARMER DISC (DRAPES) ×3 IMPLANT
DRAPE SRG 135X102X78XABS (DRAPES) ×2 IMPLANT
DRSG COVADERM 4X14 (GAUZE/BANDAGES/DRESSINGS) ×3 IMPLANT
ELECT REM PT RETURN 9FT ADLT (ELECTROSURGICAL) ×6
ELECTRODE REM PT RTRN 9FT ADLT (ELECTROSURGICAL) ×4 IMPLANT
GLOVE BIO SURGEON STRL SZ 6 (GLOVE) IMPLANT
GLOVE BIO SURGEON STRL SZ 6.5 (GLOVE) ×9 IMPLANT
GLOVE BIO SURGEON STRL SZ7 (GLOVE) IMPLANT
GLOVE BIO SURGEON STRL SZ7.5 (GLOVE) ×3 IMPLANT
GLOVE BIOGEL PI IND STRL 6 (GLOVE) ×8 IMPLANT
GLOVE BIOGEL PI IND STRL 6.5 (GLOVE) ×2 IMPLANT
GLOVE BIOGEL PI IND STRL 7.0 (GLOVE) IMPLANT
GLOVE BIOGEL PI INDICATOR 6 (GLOVE) ×4
GLOVE BIOGEL PI INDICATOR 6.5 (GLOVE) ×1
GLOVE BIOGEL PI INDICATOR 7.0 (GLOVE)
GLOVE EUDERMIC 7 POWDERFREE (GLOVE) IMPLANT
GLOVE ORTHO TXT STRL SZ7.5 (GLOVE) ×9 IMPLANT
GOWN STRL REUS W/ TWL LRG LVL3 (GOWN DISPOSABLE) ×12 IMPLANT
GOWN STRL REUS W/TWL LRG LVL3 (GOWN DISPOSABLE) ×6
GRAFT GELWEAVE VALSALVA 26CM (Prosthesis & Implant Heart) ×3 IMPLANT
HEMOSTAT POWDER SURGIFOAM 1G (HEMOSTASIS) ×9 IMPLANT
INSERT FOGARTY 61MM (MISCELLANEOUS) IMPLANT
INSERT FOGARTY XLG (MISCELLANEOUS) ×3 IMPLANT
KIT BASIN OR (CUSTOM PROCEDURE TRAY) ×3 IMPLANT
KIT DRAINAGE VACCUM ASSIST (KITS) ×3 IMPLANT
KIT ROOM TURNOVER OR (KITS) ×3 IMPLANT
KIT SUCTION CATH 14FR (SUCTIONS) ×12 IMPLANT
KIT VASOVIEW W/TROCAR VH 2000 (KITS) IMPLANT
LEAD PACING MYOCARDI (MISCELLANEOUS) ×3 IMPLANT
LINE VENT (MISCELLANEOUS) ×3 IMPLANT
MARKER GRAFT CORONARY BYPASS (MISCELLANEOUS) ×9 IMPLANT
NS IRRIG 1000ML POUR BTL (IV SOLUTION) ×18 IMPLANT
PACK OPEN HEART (CUSTOM PROCEDURE TRAY) ×3 IMPLANT
PAD ARMBOARD 7.5X6 YLW CONV (MISCELLANEOUS) ×6 IMPLANT
PAD ELECT DEFIB RADIOL ZOLL (MISCELLANEOUS) ×3 IMPLANT
PENCIL BUTTON HOLSTER BLD 10FT (ELECTRODE) ×3 IMPLANT
PUNCH AORTIC ROTATE 4.0MM (MISCELLANEOUS) IMPLANT
PUNCH AORTIC ROTATE 4.5MM 8IN (MISCELLANEOUS) IMPLANT
PUNCH AORTIC ROTATE 5MM 8IN (MISCELLANEOUS) IMPLANT
SEALANT SURG COSEAL 8ML (VASCULAR PRODUCTS) ×3 IMPLANT
SET CARDIOPLEGIA MPS 5001102 (MISCELLANEOUS) ×3 IMPLANT
SET IRRIG TUBING LAPAROSCOPIC (IRRIGATION / IRRIGATOR) ×3 IMPLANT
SOLUTION ANTI FOG 6CC (MISCELLANEOUS) ×3 IMPLANT
SPONGE GAUZE 4X4 12PLY (GAUZE/BANDAGES/DRESSINGS) ×6 IMPLANT
SPONGE GAUZE 4X4 12PLY STER LF (GAUZE/BANDAGES/DRESSINGS) ×3 IMPLANT
SPONGE LAP 18X18 X RAY DECT (DISPOSABLE) IMPLANT
SPONGE LAP 4X18 X RAY DECT (DISPOSABLE) ×3 IMPLANT
SUT BONE WAX W31G (SUTURE) ×3 IMPLANT
SUT ETHIBON 2 0 V 52N 30 (SUTURE) ×12 IMPLANT
SUT ETHIBON EXCEL 2-0 V-5 (SUTURE) IMPLANT
SUT ETHIBOND 2 0 SH (SUTURE)
SUT ETHIBOND 2 0 SH 36X2 (SUTURE) IMPLANT
SUT ETHIBOND 2 0 V4 (SUTURE) IMPLANT
SUT ETHIBOND 2 0V4 GREEN (SUTURE) IMPLANT
SUT ETHIBOND 4 0 RB 1 (SUTURE) IMPLANT
SUT ETHIBOND V-5 VALVE (SUTURE) IMPLANT
SUT ETHIBOND X763 2 0 SH 1 (SUTURE) ×9 IMPLANT
SUT MNCRL AB 3-0 PS2 18 (SUTURE) ×6 IMPLANT
SUT MNCRL AB 4-0 PS2 18 (SUTURE) IMPLANT
SUT PDS AB 1 CTX 36 (SUTURE) ×6 IMPLANT
SUT PROLENE 2 0 SH DA (SUTURE) IMPLANT
SUT PROLENE 3 0 SH DA (SUTURE) ×3 IMPLANT
SUT PROLENE 3 0 SH1 36 (SUTURE) ×12 IMPLANT
SUT PROLENE 4 0 RB 1 (SUTURE) ×13
SUT PROLENE 4 0 SH DA (SUTURE) ×9 IMPLANT
SUT PROLENE 4-0 RB1 .5 CRCL 36 (SUTURE) ×26 IMPLANT
SUT PROLENE 5 0 C 1 36 (SUTURE) ×18 IMPLANT
SUT PROLENE 6 0 C 1 30 (SUTURE) ×15 IMPLANT
SUT PROLENE 6 0 CC (SUTURE) ×3 IMPLANT
SUT PROLENE 7.0 RB 3 (SUTURE) ×9 IMPLANT
SUT PROLENE 8 0 BV175 6 (SUTURE) ×6 IMPLANT
SUT PROLENE BLUE 7 0 (SUTURE) ×3 IMPLANT
SUT PROLENE POLY MONO (SUTURE) IMPLANT
SUT SILK  1 MH (SUTURE) ×2
SUT SILK 1 MH (SUTURE) ×4 IMPLANT
SUT SILK 2 0 SH CR/8 (SUTURE) IMPLANT
SUT SILK 3 0 SH CR/8 (SUTURE) IMPLANT
SUT STEEL 6MS V (SUTURE) ×6 IMPLANT
SUT STEEL STERNAL CCS#1 18IN (SUTURE) ×3 IMPLANT
SUT STEEL SZ 6 DBL 3X14 BALL (SUTURE) ×6 IMPLANT
SUT VIC AB 1 CTX 36 (SUTURE)
SUT VIC AB 1 CTX36XBRD ANBCTR (SUTURE) IMPLANT
SUT VIC AB 2-0 CT1 27 (SUTURE)
SUT VIC AB 2-0 CT1 TAPERPNT 27 (SUTURE) IMPLANT
SUT VIC AB 2-0 CTX 27 (SUTURE) ×3 IMPLANT
SUT VIC AB 3-0 SH 27 (SUTURE)
SUT VIC AB 3-0 SH 27X BRD (SUTURE) IMPLANT
SUT VIC AB 3-0 X1 27 (SUTURE) IMPLANT
SUT VICRYL 4-0 PS2 18IN ABS (SUTURE) IMPLANT
SUTURE E-PAK OPEN HEART (SUTURE) ×3 IMPLANT
SYR 10ML KIT SKIN ADHESIVE (MISCELLANEOUS) IMPLANT
SYSTEM SAHARA CHEST DRAIN ATS (WOUND CARE) ×3 IMPLANT
TAPE CLOTH SURG 4X10 WHT LF (GAUZE/BANDAGES/DRESSINGS) ×3 IMPLANT
TAPE PAPER 2X10 WHT MICROPORE (GAUZE/BANDAGES/DRESSINGS) ×3 IMPLANT
TOWEL OR 17X24 6PK STRL BLUE (TOWEL DISPOSABLE) ×6 IMPLANT
TOWEL OR 17X26 10 PK STRL BLUE (TOWEL DISPOSABLE) ×6 IMPLANT
TRAY FOLEY IC TEMP SENS 14FR (CATHETERS) ×3 IMPLANT
TRAY FOLEY IC TEMP SENS 16FR (CATHETERS) ×3 IMPLANT
TUBE CONNECTING 12X1/4 (SUCTIONS) ×3 IMPLANT
TUBING INSUFFLATION 10FT LAP (TUBING) IMPLANT
UNDERPAD 30X30 INCONTINENT (UNDERPADS AND DIAPERS) ×3 IMPLANT
VALVE MAGNA EASE AORTIC 23MM (Prosthesis & Implant Heart) ×3 IMPLANT
VENT LEFT HEART 12002 (CATHETERS) ×3
WATER STERILE IRR 1000ML POUR (IV SOLUTION) ×6 IMPLANT
YANKAUER SUCT BULB TIP NO VENT (SUCTIONS) ×3 IMPLANT

## 2013-10-27 NOTE — Progress Notes (Deleted)
  Echocardiogram 2D Echocardiogram has been performed.  [name] [name] 10/27/2013, 8:05 AM

## 2013-10-27 NOTE — Anesthesia Procedure Notes (Signed)
Procedures   The patient was identified and consent obtained.  TO was performed, and full barrier precautions were used.  The skin was anesthetized with lidocaine.  Once the vein was located with the 22 ga. needle using ultrasound guidance , the wire was inserted into the vein.  The wire location was confirmed with ultrasound.  The insertion site was dilated and the introducer was carefully inserted and sutured in place. The PAC was checked, and floated into the PA.  Once in the PA, the catheter was secured. The patient tolerated the procedure well.  CXR was ordered for PACU. Start: 6962 End: 9528 J. Tedra Senegal, MD

## 2013-10-27 NOTE — Brief Op Note (Addendum)
      [address]       [place] [address]             [phone]     10/27/2013  12:07 PM  PATIENT:  [name] [name]  68 y.o. 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  Bioprosthetic Valve. Implant Model Number:3300TFX, Size:23, Unique Device Identifier:[id number]  Repair/Reconstruction: No.   Aortic Annular Enlargement: No.   Aortic Valve Etiology   Aortic Insufficiency:  Severe  Aortic Valve Disease:  Yes.  Aortic Stenosis:  No.  Etiology (Choose at least one and up to  5 etiologies):  Rheumatic   COMPLICATIONS: None  BASELINE WEIGHT: 77 kg  PATIENT DISPOSITION:   TO SICU IN STABLE CONDITION  [name] [name] H 10/27/2013 1:26 PM

## 2013-10-27 NOTE — Progress Notes (Signed)
S/p Bentall/ CABG x 1  Intubated, sedated  BP 86/46  Pulse 71  Temp(Src) 97.5 F (36.4 C) (Core (Comment))  Resp 18  Ht 5\' 1"  (1.549 m)  Wt 170 lb (77.111 kg)  BMI 32.14 kg/m2  SpO2 100%  CI= 2.0 PA 31/18  On dopamine at 2.5 mcg/kg/min  Stable early postop

## 2013-10-27 NOTE — Interval H&P Note (Signed)
History and Physical Interval Note:  10/27/2013 6:48 AM  Mertha Finders  has presented today for surgery, with the diagnosis of AI CAD  The various methods of treatment have been discussed with the patient and family. After consideration of risks, benefits and other options for treatment, the patient has consented to  Procedure(s): AORTIC VALVE REPLACEMENT (AVR) (N/A) CORONARY ARTERY BYPASS GRAFTING (CABG) (N/A) ASCENDING AORTIC ROOT REPLACEMENT (N/A) INTRAOPERATIVE TRANSESOPHAGEAL ECHOCARDIOGRAM (N/A) as a surgical intervention .  The patient's history has been reviewed, patient examined, no change in status, stable for surgery.  I have reviewed the patient's chart and labs.  Questions were answered to the patient's satisfaction.     Delona Clasby H

## 2013-10-27 NOTE — Anesthesia Postprocedure Evaluation (Signed)
  Anesthesia Post-op Note  Patient: [name] [name]  Procedure(s) Performed: Procedure(s): AORTIC VALVE REPLACEMENT (AVR) (N/A) CORONARY ARTERY BYPASS GRAFTING (CABG) x 1 - LIMA to LAD (N/A) ASCENDING AORTIC ROOT REPLACEMENT (N/A) INTRAOPERATIVE TRANSESOPHAGEAL ECHOCARDIOGRAM (N/A)  Patient Location: ICU  Anesthesia Type:General  Level of Consciousness: sedated  Airway and Oxygen Therapy: Patient remains intubated per anesthesia plan  Post-op Pain: none  Post-op Assessment: Post-op Vital signs reviewed, Patient's Cardiovascular Status Stable and Respiratory Function Stable  Post-op Vital Signs: Reviewed and stable  Last Vitals:  Filed Vitals:   10/27/13 1530  BP: 90/50  Pulse: 80  Temp: 36.1 C  Resp: 19    Complications: No apparent anesthesia complications

## 2013-10-27 NOTE — Anesthesia Preprocedure Evaluation (Addendum)
Anesthesia Evaluation  Patient identified by MRN, date of birth, ID band Patient awake    Reviewed: Allergy & Precautions, H&P , NPO status , Patient's Chart, lab work & pertinent test results, reviewed documented beta blocker date and time   Airway Mallampati: II  Neck ROM: full    Dental  (+) Dental Advisory Given   Pulmonary shortness of breath and with exertion,          Cardiovascular hypertension, Pt. on home beta blockers and Pt. on medications + CAD, + Peripheral Vascular Disease and +CHF + Valvular Problems/Murmurs AI  Echo 09/28/13 - Mildly dilated LV with EF 35-40%. DIffuse hypokinesis, worse in the inferior wall. There was mild aortic stenosis and probably moderate aortic insufficiency. Normal RV size and systolic function. Given dilated LV with decreased EF   Neuro/Psych    GI/Hepatic GERD-  ,  Endo/Other  diabetes, Type 2obese  Renal/GU      Musculoskeletal  (+) Arthritis -,   Abdominal   Peds  Hematology   Anesthesia Other Findings   Reproductive/Obstetrics                         Anesthesia Physical Anesthesia Plan  ASA: IV  Anesthesia Plan: General   Post-op Pain Management:    Induction: Intravenous  Airway Management Planned: Oral ETT  Additional Equipment: Arterial line, PA Cath, 3D TEE, Ultrasound Guidance Line Placement and CVP  Intra-op Plan:   Post-operative Plan: Post-operative intubation/ventilation  Informed Consent: I have reviewed the patients History and Physical, chart, labs and discussed the procedure including the risks, benefits and alternatives for the proposed anesthesia with the patient or authorized representative who has indicated his/her understanding and acceptance.   Dental advisory given  Plan Discussed with: Anesthesiologist and Surgeon  Anesthesia Plan Comments:         Anesthesia Quick Evaluation

## 2013-10-27 NOTE — Transfer of Care (Signed)
Immediate Anesthesia Transfer of Care Note  Patient: [name] [name]  Procedure(s) Performed: Procedure(s): AORTIC VALVE REPLACEMENT (AVR) (N/A) CORONARY ARTERY BYPASS GRAFTING (CABG) x 1 - LIMA to LAD (N/A) ASCENDING AORTIC ROOT REPLACEMENT (N/A) INTRAOPERATIVE TRANSESOPHAGEAL ECHOCARDIOGRAM (N/A)  Patient Location: SICU  Anesthesia Type:General  Level of Consciousness: Patient remains intubated per anesthesia plan  Airway & Oxygen Therapy: Patient remains intubated per anesthesia plan and Patient placed on Ventilator (see vital sign flow sheet for setting)  Post-op Assessment: Post -op Vital signs reviewed and stable  Post vital signs: Reviewed and stable  Complications: No apparent anesthesia complications

## 2013-10-27 NOTE — Op Note (Signed)
CARDIOTHORACIC SURGERY OPERATIVE NOTE  Date of Procedure:  10/27/2013  Preoperative Diagnosis:   Severe Aortic Insufficiency  Severe Single-vessel Coronary Artery Disease  Ascending Thoracic Aortic Aneurysm  Postoperative Diagnosis: Same  Procedure:    Biological Bentall Aortic Root Replacement  Edwards Magna Ease Pericardial Tissue Valve (size 18mm, model # 3300TFX, serial # [id number])  Vascutek Gelweave Valsalva aortic graft (size 32mm, lot #[id number])   Coronary Artery Bypass Grafting x 1  Left Internal Mammary Artery to Distal Left Anterior Descending Coronary Artery  Surgeon: [name] Gu. 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 The patient states that she has had a known heart murmur for more than 20 years and she was first diagnosed with aortic insufficiency 6 or 7 years ago. She has been followed by Dr. Radford Pax for several years with what was felt to be moderate aortic insufficiency, hypertension, and dilated aortic root. Echocardiogram performed June of 2014 revealed moderate aortic insufficiency, mild mitral regurgitation, and normal left ventricular size and systolic function with ejection fraction estimated at 55%. Over the last 2 months the patient has developed symptoms of exertional shortness of breath. Followup echocardiogram performed 09/28/2013 again revealed what was felt to be moderate aortic insufficiency, but  there was significant drop in left ventricular function with ejection fraction estimated 35-40%. Transesophageal echocardiogram was performed 10/04/2013. This demonstrated what appeared to be severe aortic insufficiency with an eccentric jet of regurgitation. There was moderate global left ventricular systolic dysfunction, mild mitral regurgitation, and mild tricuspid regurgitation. The aortic root was dilated. Cardiac catheterization was performed 10/05/2013 by Dr. Burt Knack. This reveals severe single-vessel coronary artery disease with high-grade stenosis of the mid left anterior descending coronary artery. There was dilated aortic root with severe aortic insufficiency and mild global left ventricular systolic dysfunction. Right sided pressures were normal. The patient was referred for cardiac surgical consultation.  The patient has been seen in consultation and counseled at length regarding the indications, risks and potential benefits of surgery.  All questions have been answered, and the patient provides full informed consent for the operation as described.     DETAILS OF THE OPERATIVE PROCEDURE  Preparation:  The patient is brought to the operating room on the above mentioned date and central monitoring was established by the anesthesia team including placement of Swan-Ganz catheter and radial arterial line. The patient is placed in the supine position on the operating table.  Intravenous antibiotics are administered. General endotracheal anesthesia is induced uneventfully. A Foley catheter is placed.  Baseline transesophageal echocardiogram was performed.  Findings were notable for severe aortic insufficiency. The aortic valve was tricuspid. The jet of regurgitation with central but quite eccentric. There was moderate global left ventricular systolic dysfunction with ejection fraction estimated 35%. There is mild mitral regurgitation. The aortic annulus was not particularly dilated, measuring 25 mm  in diameter. There was moderate aneurysmal enlargement of the aortic root and proximal aorta.  The patient's chest, abdomen, both groins, and both lower extremities are prepared and draped in a sterile  manner. A time out procedure is performed.   Surgical Approach and Conduit Harvest:  A median sternotomy incision was performed and the left internal mammary artery is dissected from the chest wall and prepared for bypass grafting. The left internal mammary artery is notably good quality conduit.  Following systemic heparinization, the left internal mammary artery was transected distally noted to have excellent flow.   Extracorporeal Cardiopulmonary Bypass and Myocardial Protection:  The pericardium is opened. The ascending aorta is dilated in appearance, measuring 4.5 cm in it's greatest diameter.  It tapered down to 3.0 cm at the level of the innominate artery. The ascending aorta and the right atrium are cannulated for cardioplegia bypass.  Adequate heparinization is verified.    A retrograde cardioplegia cannula is placed through the right atrium into the coronary sinus.  The operative field was continuously flooded with carbon dioxide gas.  The entire pre-bypass portion of the operation was notable for stable hemodynamics.  Cardiopulmonary bypass was begun and a left ventricular vent placed through the right superior pulmonary vein.  The surface of the heart inspected. Distal target vessels are selected for coronary artery bypass grafting. A cardioplegia cannula is placed in the ascending aorta.  A temperature probe was placed in the interventricular septum.  The patient is cooled to 34C systemic temperature.  The aortic cross clamp is applied and cold blood cardioplegia is delivered initially in an antegrade fashion through the aortic root.   Supplemental cardioplegia is given retrograde through the coronary sinus catheter.  Iced saline slush is applied for topical hypothermia.  The initial  cardioplegic arrest is rapid with early diastolic arrest.  Repeat doses of cardioplegia are administered intermittently throughout the entire cross clamp portion of the operation through the coronary sinus catheter in order to maintain completely flat electrocardiogram and septal myocardial temperature below 15C.  Myocardial protection was felt to be excellent.   Coronary Artery Bypass Grafting:  The distal left anterior coronary artery was grafted with the left internal mammary artery in an end-to-side fashion.  At the site of distal anastomosis the target vessel was good quality and measured approximately 1.5 mm in diameter.   Biological Bentall Aortic Root Replacement:  The ascending aorta was transected just below the aortic cross-clamp. The aorta was again transected at the level of the sinotubular junction and the intervening segment of aneurysm was removed.  The aortic valve was inspected and notable for classical rheumatic features with sclerosis of the free margin of the three leaflets with malcoaptation in the center of the valve.  There was severe aortic insufficiency.  The aortic valve leaflets were excised sharply.  Decalcification was not necessary.  The left main and the right coronary artery were each mobilized on separate buttons of aortic tissue and the remainder of the sinuses of Valsalva were were removed.  The aortic annulus was sized to accept a 23 mm prosthesis.  The aortic root and left ventricle were irrigated with copious cold saline solution.  Aortic root replacement was performed using an [facility] Ease pericardial tissue valve (size 23 mm, model # 3300TFX, serial # [id number]) implanted within a Vascutek gelweave Valsalva aortic graft (size 26 mm, lot #[id number]).  Half of the proximal rings of the straight portion of the proximal end of the graft were excised. The proximal suture line was constructed using interrupted horizontal mattress 2-0 Ethibond pledgeted sutures with  pledgets in the supraannular position.  The left main and the right coronary arteries were each independently reimplanted  into the corresponding Valsalva portion of the aortic graft. The distal end of the graft was beveled and trimmed to an appropriate length and sewn in an end-to-end fashion to the ascending thoracic aorta with running 4-0 Prolene suture using Teflon felt strips to buttress the suture line.   Procedure Completion:  The septal myocardial temperature rose rapidly after reperfusion of the left internal mammary artery graft.  One final dose of warm retrograde "hot shot" cardioplegia was administered through the coronary sinus catheter while all air was evacuated through the aortic root.  The aortic cross clamp was removed after a total cross clamp time of 132 minutes.  All proximal and distal coronary anastomoses were inspected for hemostasis and appropriate graft orientation. Epicardial pacing wires are fixed to the right ventricular outflow tract and to the right atrial appendage. The patient is rewarmed to 37C temperature. The aortic and left ventricular vents were removed.  The patient is weaned and disconnected from cardiopulmonary bypass.  The patient's rhythm at separation from bypass was AV paced.  The patient was weaned from cardioplegic bypass on low dose dopamine. Total cardiopulmonary bypass time for the operation was 172 minutes.  Followup transesophageal echocardiogram performed after separation from bypass revealed a well-seated bioprosthetic tissue valve in the aortic position that was functioning normally.  There was no central nor perivalvular leak.  There were otherwise no changes from the preoperative exam.  The aortic and venous cannula were removed uneventfully. Protamine was administered to reverse the anticoagulation. The mediastinum and pleural space were inspected for hemostasis and irrigated with saline solution. The mediastinum and both pleural spaces were  drained using 4 chest tubes placed through separate stab incisions inferiorly.  The soft tissues anterior to the aorta were reapproximated loosely. The sternum is closed with double strength sternal wire. The soft tissues anterior to the sternum were closed in multiple layers and the skin is closed with a running subcuticular skin closure.  The post-bypass portion of the operation was notable for stable rhythm and hemodynamics.  No blood products were administered during the operation.   Disposition:  The patient tolerated the procedure well and is transported to the surgical intensive care in stable condition. There are no intraoperative complications. All sponge instrument and needle counts are verified correct at completion of the operation.   [name] Gu. Roxy Manns MD 10/27/2013 1:33 PM

## 2013-10-27 NOTE — Procedures (Signed)
Extubation Procedure Note  Patient Details:   Name: [name] [name] DOB: 05-25-1945 MRN: [id number]   Airway Documentation:     Evaluation  O2 sats: stable throughout Complications: No apparent complications Patient did tolerate procedure well. Bilateral Breath Sounds: Clear Suctioning: Airway Yes  No complications noted. Pt able to speak, and stable vital signs. Extubated to 3lpm 02 humidified.   [place] 10/27/2013, 9:45 PM

## 2013-10-27 NOTE — Progress Notes (Signed)
  Echocardiogram Echocardiogram Transesophageal has been performed.  [name] [name] 10/27/2013, 8:05 AM

## 2013-10-27 NOTE — Progress Notes (Signed)
Good respiratory effort, cough, and could follow directions.

## 2013-10-28 ENCOUNTER — Encounter (HOSPITAL_COMMUNITY): Payer: Self-pay | Admitting: Thoracic Surgery (Cardiothoracic Vascular Surgery)

## 2013-10-28 ENCOUNTER — Inpatient Hospital Stay (HOSPITAL_COMMUNITY): Payer: Medicare Other

## 2013-10-28 LAB — POCT I-STAT, CHEM 8
BUN: 6 mg/dL (ref 6–23)
Calcium, Ion: 1.09 mmol/L — ABNORMAL LOW (ref 1.13–1.30)
Chloride: 100 mEq/L (ref 96–112)
Creatinine, Ser: 0.8 mg/dL (ref 0.50–1.10)
Glucose, Bld: 170 mg/dL — ABNORMAL HIGH (ref 70–99)
HCT: 32 % — ABNORMAL LOW (ref 36.0–46.0)
Hemoglobin: 10.9 g/dL — ABNORMAL LOW (ref 12.0–15.0)
Potassium: 3.7 mEq/L (ref 3.7–5.3)
Sodium: 134 mEq/L — ABNORMAL LOW (ref 137–147)
TCO2: 22 mmol/L (ref 0–100)

## 2013-10-28 LAB — BLOOD GAS, ARTERIAL
Acid-base deficit: 1 mmol/L (ref 0.0–2.0)
Bicarbonate: 23 mEq/L (ref 20.0–24.0)
Drawn by: 36496
FIO2: 0.32 %
O2 Saturation: 98.1 %
Patient temperature: 98.6
TCO2: 24.2 mmol/L (ref 0–100)
pCO2 arterial: 37.2 mmHg (ref 35.0–45.0)
pH, Arterial: 7.408 (ref 7.350–7.450)
pO2, Arterial: 97.2 mmHg (ref 80.0–100.0)

## 2013-10-28 LAB — CREATININE, SERUM
Creatinine, Ser: 0.67 mg/dL (ref 0.50–1.10)
GFR calc Af Amer: >90 mL/min (ref >90)
GFR calc non Af Amer: 87 mL/min — ABNORMAL LOW (ref >90)

## 2013-10-28 LAB — MAGNESIUM
Magnesium: 2.5 mg/dL (ref 1.5–2.5)
Magnesium: 2.1 mg/dL (ref 1.5–2.5)

## 2013-10-28 LAB — GLUCOSE, CAPILLARY
Glucose-Capillary: 115 mg/dL — ABNORMAL HIGH (ref 70–99)
Glucose-Capillary: 122 mg/dL — ABNORMAL HIGH (ref 70–99)
Glucose-Capillary: 136 mg/dL — ABNORMAL HIGH (ref 70–99)
Glucose-Capillary: 142 mg/dL — ABNORMAL HIGH (ref 70–99)
Glucose-Capillary: 163 mg/dL — ABNORMAL HIGH (ref 70–99)
Glucose-Capillary: 84 mg/dL (ref 70–99)
Glucose-Capillary: 166 mg/dL — ABNORMAL HIGH (ref 70–99)
Glucose-Capillary: 103 mg/dL — ABNORMAL HIGH (ref 70–99)
Glucose-Capillary: 106 mg/dL — ABNORMAL HIGH (ref 70–99)
Glucose-Capillary: 107 mg/dL — ABNORMAL HIGH (ref 70–99)
Glucose-Capillary: 109 mg/dL — ABNORMAL HIGH (ref 70–99)
Glucose-Capillary: 117 mg/dL — ABNORMAL HIGH (ref 70–99)
Glucose-Capillary: 117 mg/dL — ABNORMAL HIGH (ref 70–99)
Glucose-Capillary: 123 mg/dL — ABNORMAL HIGH (ref 70–99)
Glucose-Capillary: 125 mg/dL — ABNORMAL HIGH (ref 70–99)
Glucose-Capillary: 129 mg/dL — ABNORMAL HIGH (ref 70–99)
Glucose-Capillary: 156 mg/dL — ABNORMAL HIGH (ref 70–99)
Glucose-Capillary: 174 mg/dL — ABNORMAL HIGH (ref 70–99)

## 2013-10-28 LAB — BASIC METABOLIC PANEL
Anion gap: 14 (ref 5–15)
BUN: 8 mg/dL (ref 6–23)
CO2: 23 mEq/L (ref 19–32)
Calcium: 7.7 mg/dL — ABNORMAL LOW (ref 8.4–10.5)
Chloride: 106 mEq/L (ref 96–112)
Creatinine, Ser: 0.55 mg/dL (ref 0.50–1.10)
GFR calc Af Amer: >90 mL/min (ref >90)
GFR calc non Af Amer: >90 mL/min (ref >90)
Glucose, Bld: 103 mg/dL — ABNORMAL HIGH (ref 70–99)
Potassium: 4.1 mEq/L (ref 3.7–5.3)
Sodium: 143 mEq/L (ref 137–147)

## 2013-10-28 LAB — CBC
HCT: 33.2 % — ABNORMAL LOW (ref 36.0–46.0)
HCT: 31.4 % — ABNORMAL LOW (ref 36.0–46.0)
Hemoglobin: 11.2 g/dL — ABNORMAL LOW (ref 12.0–15.0)
Hemoglobin: 10.7 g/dL — ABNORMAL LOW (ref 12.0–15.0)
MCH: 33.3 pg (ref 26.0–34.0)
MCH: 34 pg (ref 26.0–34.0)
MCHC: 33.7 g/dL (ref 30.0–36.0)
MCHC: 34.1 g/dL (ref 30.0–36.0)
MCV: 98.8 fL (ref 78.0–100.0)
MCV: 99.7 fL (ref 78.0–100.0)
Platelets: 103 10*3/uL — ABNORMAL LOW (ref 150–400)
Platelets: 89 10*3/uL — ABNORMAL LOW (ref 150–400)
RBC: 3.36 MIL/uL — ABNORMAL LOW (ref 3.87–5.11)
RBC: 3.15 MIL/uL — ABNORMAL LOW (ref 3.87–5.11)
RDW: 13 % (ref 11.5–15.5)
RDW: 12.8 % (ref 11.5–15.5)
WBC: 16.6 10*3/uL — ABNORMAL HIGH (ref 4.0–10.5)
WBC: 15.5 10*3/uL — ABNORMAL HIGH (ref 4.0–10.5)

## 2013-10-28 MED ORDER — INSULIN ASPART 100 UNIT/ML ~~LOC~~ SOLN
0.0000 [IU] | SUBCUTANEOUS | Status: DC
  Administered 2013-10-28 (×2): 4 [IU] via SUBCUTANEOUS

## 2013-10-28 MED ORDER — ASPIRIN EC 325 MG PO TBEC
325.0000 mg | DELAYED_RELEASE_TABLET | Freq: Every day | ORAL | Status: DC
  Administered 2013-10-28 – 2013-10-31 (×4): 325 mg via ORAL
  Filled 2013-10-28 (×4): qty 1

## 2013-10-28 MED ORDER — POTASSIUM CHLORIDE 10 MEQ/50ML IV SOLN
10.0000 meq | INTRAVENOUS | Status: AC | PRN
  Administered 2013-10-28 (×3): 10 meq via INTRAVENOUS
  Filled 2013-10-28 (×3): qty 50

## 2013-10-28 MED ORDER — CARVEDILOL 6.25 MG PO TABS
6.2500 mg | ORAL_TABLET | Freq: Two times a day (BID) | ORAL | Status: DC
  Administered 2013-10-28 – 2013-10-31 (×7): 6.25 mg via ORAL
  Filled 2013-10-28 (×9): qty 1

## 2013-10-28 MED ORDER — MORPHINE SULFATE 2 MG/ML IJ SOLN
2.0000 mg | INTRAMUSCULAR | Status: DC | PRN
  Administered 2013-10-28 (×3): 2 mg via INTRAVENOUS
  Filled 2013-10-28 (×3): qty 1

## 2013-10-28 MED ORDER — FUROSEMIDE 10 MG/ML IJ SOLN
20.0000 mg | Freq: Four times a day (QID) | INTRAMUSCULAR | Status: AC
  Administered 2013-10-28 (×3): 20 mg via INTRAVENOUS
  Filled 2013-10-28 (×2): qty 2

## 2013-10-28 MED ORDER — INSULIN DETEMIR 100 UNIT/ML ~~LOC~~ SOLN
20.0000 [IU] | Freq: Two times a day (BID) | SUBCUTANEOUS | Status: DC
  Administered 2013-10-28 – 2013-10-30 (×4): 20 [IU] via SUBCUTANEOUS
  Filled 2013-10-28 (×6): qty 0.2

## 2013-10-28 MED FILL — Heparin Sodium (Porcine) Inj 1000 Unit/ML: INTRAMUSCULAR | Qty: 30 | Status: AC

## 2013-10-28 MED FILL — Potassium Chloride Inj 2 mEq/ML: INTRAVENOUS | Qty: 40 | Status: AC

## 2013-10-28 MED FILL — Sodium Bicarbonate IV Soln 8.4%: INTRAVENOUS | Qty: 50 | Status: AC

## 2013-10-28 MED FILL — Magnesium Sulfate Inj 50%: INTRAMUSCULAR | Qty: 10 | Status: AC

## 2013-10-28 MED FILL — Mannitol IV Soln 20%: INTRAVENOUS | Qty: 500 | Status: AC

## 2013-10-28 MED FILL — Electrolyte-R (PH 7.4) Solution: INTRAVENOUS | Qty: 5000 | Status: AC

## 2013-10-28 MED FILL — Heparin Sodium (Porcine) Inj 1000 Unit/ML: INTRAMUSCULAR | Qty: 60 | Status: AC

## 2013-10-28 MED FILL — Sodium Chloride IV Soln 0.9%: INTRAVENOUS | Qty: 2000 | Status: AC

## 2013-10-28 MED FILL — Lidocaine HCl IV Inj 20 MG/ML: INTRAVENOUS | Qty: 5 | Status: AC

## 2013-10-28 MED FILL — Albumin, Human Inj 5%: INTRAVENOUS | Qty: 250 | Status: AC

## 2013-10-28 NOTE — Progress Notes (Signed)
[facility] BRIEF SICU PROGRESS NOTE  1 Day Post-Op  S/P Procedure(s) (LRB): AORTIC VALVE REPLACEMENT (AVR) (N/A) CORONARY ARTERY BYPASS GRAFTING (CABG) x 1 - LIMA to LAD (N/A) ASCENDING AORTIC ROOT REPLACEMENT (N/A) INTRAOPERATIVE TRANSESOPHAGEAL ECHOCARDIOGRAM (N/A)   Stable day NSR w/ stable BP Diuresing well  Plan: Continue current plan  [name] [name] 10/28/2013 5:56 PM

## 2013-10-28 NOTE — Care Management Note (Addendum)
    Page 1 of 1   10/31/2013     3:48:07 PM CARE MANAGEMENT NOTE 10/31/2013  Patient:  GWENETTA, DEVOS   Account Number:  [id number]  Date Initiated:  10/28/2013  Documentation initiated by:  Elissa Hefty  Subjective/Objective Assessment:   adm w valve repair     Action/Plan:   lives w husband, pcp dr Christiane Ha [place]   Anticipated DC Date:  10/31/2013   Anticipated DC Plan:  [facility]  CM consult      Choice offered to / List presented to:             Status of service:  Completed, signed off Medicare Important Message given?  YES (If response is "NO", the following Medicare IM given date fields will be blank) Date Medicare IM given:  10/31/2013 Medicare IM given by:  Pink Maye Date Additional Medicare IM given:   Additional Medicare IM given by:    Discharge Disposition:  HOME/SELF CARE  Per UR Regulation:  Reviewed for med. necessity/level of care/duration of stay  If discussed at [place] of Stay Meetings, dates discussed:    Comments:  10/31/13 Ellan Lambert, RN, BSN [phone] Pt for likely dc later today; no home needs identified.

## 2013-10-28 NOTE — Progress Notes (Addendum)
      [address]       [place] [address]             [phone]        CARDIOTHORACIC SURGERY PROGRESS NOTE   R1 Day Post-Op Procedure(s) (LRB): AORTIC VALVE REPLACEMENT (AVR) (N/A) CORONARY ARTERY BYPASS GRAFTING (CABG) x 1 - LIMA to LAD (N/A) ASCENDING AORTIC ROOT REPLACEMENT (N/A) INTRAOPERATIVE TRANSESOPHAGEAL ECHOCARDIOGRAM (N/A)  Subjective: Looks good.  Feels sore in chest but o/w doing well.  Objective: Vital signs: BP Readings from Last 1 Encounters:  10/28/13 105/53   Pulse Readings from Last 1 Encounters:  10/28/13 88   Resp Readings from Last 1 Encounters:  10/28/13 24   Temp Readings from Last 1 Encounters:  10/28/13 99.1 F (37.3 C)     Hemodynamics: PAP: (27-38)/(13-26) 32/13 mmHg CO:  [3.1 L/min-4.7 L/min] 4.7 L/min CI:  [1.8 L/min/m2-3 L/min/m2] 3 L/min/m2  Physical Exam:  Rhythm:   sinus  Breath sounds: clear  Heart sounds:  RRR w/out murmur  Incisions:  Dressing dry, intact  Abdomen:  Soft, non-distended, non-tender  Extremities:  Warm, well-perfused    Intake/Output from previous day: 07/23 0701 - 07/24 0700 In: 5858.6 [I.V.:4015.6; Blood:433; NG/GT:60; IV Piggyback:1350] Out: 7939 [QZESP:2330; Emesis/NG output:50; Blood:900; Chest Tube:566] Intake/Output this shift:    Lab Results:  CBC: Recent Labs  10/27/13 2015 10/28/13 0420  WBC 14.3* 16.6*  HGB 11.7* 11.2*  HCT 34.0* 33.2*  PLT 104* 103*    BMET:  Recent Labs  10/27/13 2001 10/27/13 2015 10/28/13 0420  NA 142  --  143  K 4.4  --  4.1  CL 109  --  106  CO2  --   --  23  GLUCOSE 128*  --  103*  BUN 7  --  8  CREATININE 0.50 0.57 0.55  CALCIUM  --   --  7.7*     CBG (last 3)   Recent Labs  10/27/13 1608 10/27/13 1708 10/27/13 1807  GLUCAP 80 126* 150*    ABG    Component Value Date/Time   PHART 7.408 10/28/2013 0500   PCO2ART 37.2 10/28/2013 0500   PO2ART 97.2 10/28/2013 0500   HCO3 23.0 10/28/2013 0500   TCO2 24.2 10/28/2013  0500   ACIDBASEDEF 1.0 10/28/2013 0500   O2SAT 98.1 10/28/2013 0500    CXR: Looks good.  Mild bibasilar atelectasis, L>R  EKG: NSR w/out acute ischemic changes     Assessment/Plan: S/P Procedure(s) (LRB): AORTIC VALVE REPLACEMENT (AVR) (N/A) CORONARY ARTERY BYPASS GRAFTING (CABG) x 1 - LIMA to LAD (N/A) ASCENDING AORTIC ROOT REPLACEMENT (N/A) INTRAOPERATIVE TRANSESOPHAGEAL ECHOCARDIOGRAM (N/A)  Doing very well POD1 Maintaining NSR w/ stable hemodynamics off all drips O2 sats 93-96% on 4 L/min via [place] Expected post op acute blood loss anemia, mild Expected post op volume excess, mild Expected post op atelectasis, mild Type II diabetes mellitus, excellent glycemic control on insulin drip   Mobilize  Diuresis  D/C tubes and lines  Add levemir insulin and wean drip off  SCD boots - no pharmacologic DVT prophylaxis due to risk of bleeding   [name] [name] H 10/28/2013 7:33 AM

## 2013-10-29 ENCOUNTER — Inpatient Hospital Stay (HOSPITAL_COMMUNITY): Payer: Medicare Other

## 2013-10-29 LAB — GLUCOSE, CAPILLARY
Glucose-Capillary: 85 mg/dL (ref 70–99)
Glucose-Capillary: 94 mg/dL (ref 70–99)
Glucose-Capillary: 100 mg/dL — ABNORMAL HIGH (ref 70–99)
Glucose-Capillary: 132 mg/dL — ABNORMAL HIGH (ref 70–99)

## 2013-10-29 LAB — CBC
HCT: 29.1 % — ABNORMAL LOW (ref 36.0–46.0)
Hemoglobin: 10 g/dL — ABNORMAL LOW (ref 12.0–15.0)
MCH: 34.1 pg — ABNORMAL HIGH (ref 26.0–34.0)
MCHC: 34.4 g/dL (ref 30.0–36.0)
MCV: 99.3 fL (ref 78.0–100.0)
Platelets: 90 10*3/uL — ABNORMAL LOW (ref 150–400)
RBC: 2.93 MIL/uL — ABNORMAL LOW (ref 3.87–5.11)
RDW: 12.9 % (ref 11.5–15.5)
WBC: 14.8 10*3/uL — ABNORMAL HIGH (ref 4.0–10.5)

## 2013-10-29 LAB — BASIC METABOLIC PANEL
Anion gap: 9 (ref 5–15)
BUN: 9 mg/dL (ref 6–23)
CO2: 26 mEq/L (ref 19–32)
Calcium: 7.7 mg/dL — ABNORMAL LOW (ref 8.4–10.5)
Chloride: 104 mEq/L (ref 96–112)
Creatinine, Ser: 0.73 mg/dL (ref 0.50–1.10)
GFR calc Af Amer: >90 mL/min (ref >90)
GFR calc non Af Amer: 85 mL/min — ABNORMAL LOW (ref >90)
Glucose, Bld: 80 mg/dL (ref 70–99)
Potassium: 4.3 mEq/L (ref 3.7–5.3)
Sodium: 139 mEq/L (ref 137–147)

## 2013-10-29 MED ORDER — MAGNESIUM HYDROXIDE 400 MG/5ML PO SUSP: 30.0000 mL | Freq: Every day | ORAL | Status: DC | PRN

## 2013-10-29 MED ORDER — SODIUM CHLORIDE 0.9 % IV SOLN: 250.0000 mL | INTRAVENOUS | Status: DC | PRN

## 2013-10-29 MED ORDER — TRAMADOL HCL 50 MG PO TABS: 50.0000 mg | ORAL_TABLET | ORAL | Status: DC | PRN

## 2013-10-29 MED ORDER — POTASSIUM CHLORIDE CRYS ER 20 MEQ PO TBCR
20.0000 meq | EXTENDED_RELEASE_TABLET | Freq: Every day | ORAL | Status: DC
  Filled 2013-10-29: qty 1

## 2013-10-29 MED ORDER — SODIUM CHLORIDE 0.9 % IJ SOLN
3.0000 mL | Freq: Two times a day (BID) | INTRAMUSCULAR | Status: DC
  Administered 2013-10-30: 3 mL via INTRAVENOUS

## 2013-10-29 MED ORDER — INSULIN ASPART 100 UNIT/ML ~~LOC~~ SOLN: 0.0000 [IU] | Freq: Three times a day (TID) | SUBCUTANEOUS | Status: DC

## 2013-10-29 MED ORDER — ATORVASTATIN CALCIUM 10 MG PO TABS
10.0000 mg | ORAL_TABLET | Freq: Every day | ORAL | Status: DC
  Administered 2013-10-30: 10 mg via ORAL
  Filled 2013-10-29 (×2): qty 1

## 2013-10-29 MED ORDER — SODIUM CHLORIDE 0.9 % IJ SOLN: 3.0000 mL | INTRAMUSCULAR | Status: DC | PRN

## 2013-10-29 MED ORDER — FUROSEMIDE 40 MG PO TABS
40.0000 mg | ORAL_TABLET | Freq: Every day | ORAL | Status: DC
  Administered 2013-10-29 – 2013-10-31 (×3): 40 mg via ORAL
  Filled 2013-10-29 (×3): qty 1

## 2013-10-29 MED ORDER — MOVING RIGHT ALONG BOOK
Freq: Once | Status: AC
  Administered 2013-10-29: 11:00:00
  Filled 2013-10-29: qty 1

## 2013-10-29 MED ORDER — SODIUM CHLORIDE 0.9 % IV SOLN: INTRAVENOUS | Status: DC

## 2013-10-29 NOTE — Progress Notes (Signed)
Pt ambulating in hall with family

## 2013-10-29 NOTE — Progress Notes (Signed)
Pt ambulated 160ft in hall with RW.  Able to wean O2 to 2L prior to walk with Pt standing, SPO2 97-98%.  Slow steady gait, no complaints.  To recliner after walk with call bell in reach and family in room.  Will con't plan of care.

## 2013-10-29 NOTE — Progress Notes (Signed)
Phase I Cardiac Rehab  Pt has been walking with staff in hallway.  Just returned from walk with nurse.  She stated that she had already walked 3x before being transferred.  Encouraged pt to continue to walk with staff over weekend and that we would start walking with her on Monday. Alberteen Sam, [place], [place] [phone]

## 2013-10-29 NOTE — Progress Notes (Addendum)
[address]       [place] [address]             [phone]        CARDIOTHORACIC SURGERY PROGRESS NOTE   R2 Days Post-Op Procedure(s) (LRB): AORTIC VALVE REPLACEMENT (AVR) (N/A) CORONARY ARTERY BYPASS GRAFTING (CABG) x 1 - LIMA to LAD (N/A) ASCENDING AORTIC ROOT REPLACEMENT (N/A) INTRAOPERATIVE TRANSESOPHAGEAL ECHOCARDIOGRAM (N/A)  Subjective: Feels pretty well.  Soreness improved.  Ambulating reasonably well.  Feels weak.  Objective: Vital signs: BP Readings from Last 1 Encounters:  10/29/13 111/61   Pulse Readings from Last 1 Encounters:  10/29/13 77   Resp Readings from Last 1 Encounters:  10/29/13 19   Temp Readings from Last 1 Encounters:  10/29/13 97.4 F (36.3 C) Oral    Hemodynamics: PAP: (32-35)/(14-15) 32/14 mmHg  Physical Exam:  Rhythm:   sinus  Breath sounds: clear  Heart sounds:  RRR w/out murmur  Incisions:  Dressing dry, intact  Abdomen:  Soft, non-distended, non-tender  Extremities:  Warm, well-perfused    Intake/Output from previous day: 07/24 0701 - 07/25 0700 In: 810 [P.O.:480; I.V.:80; IV Piggyback:250] Out: 2800 [Urine:2700; Chest Tube:100] Intake/Output this shift: Total I/O In: 20 [I.V.:20] Out: 40 [Urine:40]  Lab Results:  CBC: Recent Labs  10/28/13 1900 10/28/13 1956 10/29/13 0414  WBC 15.5*  --  14.8*  HGB 10.7* 10.9* 10.0*  HCT 31.4* 32.0* 29.1*  PLT 89*  --  90*    BMET:  Recent Labs  10/28/13 0420  10/28/13 1956 10/29/13 0414  NA 143  --  134* 139  K 4.1  --  3.7 4.3  CL 106  --  100 104  CO2 23  --   --  26  GLUCOSE 103*  --  170* 80  BUN 8  --  6 9  CREATININE 0.55  < > 0.80 0.73  CALCIUM 7.7*  --   --  7.7*  < > = values in this interval not displayed.   CBG (last 3)   Recent Labs  10/28/13 2315 10/29/13 0419 10/29/13 0750  GLUCAP 174* 85 94    ABG    Component Value Date/Time   PHART 7.408 10/28/2013 0500   PCO2ART 37.2 10/28/2013 0500   PO2ART 97.2 10/28/2013  0500   HCO3 23.0 10/28/2013 0500   TCO2 22 10/28/2013 1956   ACIDBASEDEF 1.0 10/28/2013 0500   O2SAT 98.1 10/28/2013 0500    CXR: PORTABLE CHEST - 1 VIEW  COMPARISON: Chest x-ray 10/28/2013.  FINDINGS:  Previously noted Swan-Ganz catheter has been removed. Right IJ  central venous Cordis remains in position with tip in the proximal  superior vena cava. Previously noted bilateral chest tubes and  mediastinal/pericardial drains have been removed. Epicardial pacing  wires remain in position. Lung volumes are low. No pneumothorax.  There are bibasilar opacities favored to reflect areas of  subsegmental atelectasis, in addition to a small left pleural  effusion. No definite consolidative airspace disease. No evidence of  pulmonary edema. Mild enlargement of the cardiopericardial  silhouette, within normal limits for a postoperative patient. Upper  mediastinal contours are within normal limits allowing for slight  patient rotation to the left. Atherosclerosis in the thoracic aorta.  Status post median sternotomy. Bioprosthetic aortic valve noted.  IMPRESSION:  1. Support apparatus and postoperative changes, as above.  2. Low lung volumes with bibasilar areas of postoperative  subsegmental atelectasis and small left pleural effusion.  Electronically Signed  By: Vinnie Langton M.D.  On: 10/29/2013 09:11   Assessment/Plan: S/P Procedure(s) (LRB): AORTIC VALVE REPLACEMENT (AVR) (N/A) CORONARY ARTERY BYPASS GRAFTING (CABG) x 1 - LIMA to LAD (N/A) ASCENDING AORTIC ROOT REPLACEMENT (N/A) INTRAOPERATIVE TRANSESOPHAGEAL ECHOCARDIOGRAM (N/A)  Overall doing well POD2 Maintaining NSR w/ stable BP Expected post op acute blood loss anemia, mild, stable Expected post op volume excess, mild, diuresing Expected post op atelectasis, mild Post op thrombocytopenia, mild, stable Type II diabetes mellitus, excellent glycemic control   Mobilize  Diuresis  Continue carvedilol - consider ACE-I if  BP increases  Convert CBG's and SSI to ac/hs - continue levemir for now but stop and resume metformin once oral intake improves  Trasfer 2W    Samentha Perham H 10/29/2013 10:08 AM

## 2013-10-29 NOTE — Progress Notes (Signed)
95% RMA WHILE AMBULATING

## 2013-10-30 ENCOUNTER — Inpatient Hospital Stay (HOSPITAL_COMMUNITY): Payer: Medicare Other

## 2013-10-30 LAB — BASIC METABOLIC PANEL
Anion gap: 12 (ref 5–15)
BUN: 11 mg/dL (ref 6–23)
CO2: 23 mEq/L (ref 19–32)
Calcium: 8.2 mg/dL — ABNORMAL LOW (ref 8.4–10.5)
Chloride: 100 mEq/L (ref 96–112)
Creatinine, Ser: 0.63 mg/dL (ref 0.50–1.10)
GFR calc Af Amer: >90 mL/min (ref >90)
GFR calc non Af Amer: 89 mL/min — ABNORMAL LOW (ref >90)
Glucose, Bld: 59 mg/dL — ABNORMAL LOW (ref 70–99)
Potassium: 3.5 mEq/L — ABNORMAL LOW (ref 3.7–5.3)
Sodium: 135 mEq/L — ABNORMAL LOW (ref 137–147)

## 2013-10-30 LAB — TYPE AND SCREEN
ABO/RH(D): O NEG
Antibody Screen: POSITIVE
Donor AG Type: NEGATIVE
Donor AG Type: NEGATIVE
Unit division: 0
Unit division: 0
Unit division: 0
Unit division: 0

## 2013-10-30 LAB — GLUCOSE, CAPILLARY
Glucose-Capillary: 61 mg/dL — ABNORMAL LOW (ref 70–99)
Glucose-Capillary: 87 mg/dL (ref 70–99)
Glucose-Capillary: 125 mg/dL — ABNORMAL HIGH (ref 70–99)
Glucose-Capillary: 124 mg/dL — ABNORMAL HIGH (ref 70–99)
Glucose-Capillary: 102 mg/dL — ABNORMAL HIGH (ref 70–99)

## 2013-10-30 LAB — CBC
HCT: 31.6 % — ABNORMAL LOW (ref 36.0–46.0)
Hemoglobin: 10.6 g/dL — ABNORMAL LOW (ref 12.0–15.0)
MCH: 33 pg (ref 26.0–34.0)
MCHC: 33.5 g/dL (ref 30.0–36.0)
MCV: 98.4 fL (ref 78.0–100.0)
Platelets: 95 10*3/uL — ABNORMAL LOW (ref 150–400)
RBC: 3.21 MIL/uL — ABNORMAL LOW (ref 3.87–5.11)
RDW: 12.8 % (ref 11.5–15.5)
WBC: 11.9 10*3/uL — ABNORMAL HIGH (ref 4.0–10.5)

## 2013-10-30 MED ORDER — POTASSIUM CHLORIDE CRYS ER 20 MEQ PO TBCR
20.0000 meq | EXTENDED_RELEASE_TABLET | Freq: Two times a day (BID) | ORAL | Status: DC
Start: 2013-10-30 — End: 2013-10-31
  Administered 2013-10-30 – 2013-10-31 (×3): 20 meq via ORAL
  Filled 2013-10-30 (×3): qty 1

## 2013-10-30 MED ORDER — LACTULOSE 10 GM/15ML PO SOLN
10.0000 g | Freq: Every day | ORAL | Status: DC | PRN
  Administered 2013-10-30: 10 g via ORAL
  Filled 2013-10-30: qty 15

## 2013-10-30 MED ORDER — METFORMIN HCL ER 500 MG PO TB24
500.0000 mg | ORAL_TABLET | Freq: Every day | ORAL | Status: DC
  Administered 2013-10-31: 500 mg via ORAL
  Filled 2013-10-30 (×3): qty 1

## 2013-10-30 NOTE — Progress Notes (Signed)
Pt ambulating hall with husband using RW following standby assist to the bathroom.

## 2013-10-30 NOTE — Progress Notes (Signed)
Pt now on 4th walk for the day, with husband and RW.

## 2013-10-30 NOTE — Progress Notes (Addendum)
       [address]       [place] [address]             [phone]          3 Days Post-Op Procedure(s) (LRB): AORTIC VALVE REPLACEMENT (AVR) (N/A) CORONARY ARTERY BYPASS GRAFTING (CABG) x 1 - LIMA to LAD (N/A) ASCENDING AORTIC ROOT REPLACEMENT (N/A) INTRAOPERATIVE TRANSESOPHAGEAL ECHOCARDIOGRAM (N/A)  Subjective: Comfortable, main complaint is hypoglycemia earlier this am, symptomatic. Also c/o constipation.  Tolerating diet, walking without difficulty.   Objective: Vital signs in last 24 hours: Patient Vitals for the past 24 hrs:  BP Temp Temp src Pulse Resp SpO2 Weight  10/30/13 0436 105/65 mmHg 97.6 F (36.4 C) Oral 61 20 92 % 173 lb 6.4 oz (78.654 kg)  10/29/13 2132 102/66 mmHg 98.8 F (37.1 C) Oral 78 19 91 % -  10/29/13 1730 - - - - - 95 % -  10/29/13 1600 - - - - - 98 % -  10/29/13 1300 119/79 mmHg 97.5 F (36.4 C) Oral 86 18 100 % -  10/29/13 1100 82/62 mmHg - - 84 19 99 % -  10/29/13 1000 113/57 mmHg - - 83 18 100 % -  10/29/13 0900 111/61 mmHg - - 77 19 97 % -   Current Weight  10/30/13 173 lb 6.4 oz (78.654 kg)  BASELINE WEIGHT: 77 kg    Intake/Output from previous day: 07/25 0701 - 07/26 0700 In: 730 [P.O.:700; I.V.:30] Out: 1380 [Urine:1380]  CBGs 132-59-61-87   PHYSICAL EXAM:  Heart: RRR Lungs: Clear Wound: Clean and dry Extremities: Trace LE edema    Lab Results: CBC: Recent Labs  10/29/13 0414 10/30/13 0505  WBC 14.8* 11.9*  HGB 10.0* 10.6*  HCT 29.1* 31.6*  PLT 90* 95*   BMET:  Recent Labs  10/29/13 0414 10/30/13 0505  NA 139 135*  K 4.3 3.5*  CL 104 100  CO2 26 23  GLUCOSE 80 59*  BUN 9 11  CREATININE 0.73 0.63  CALCIUM 7.7* 8.2*    PT/INR:  Recent Labs  10/27/13 1415  LABPROT 18.0*  INR 1.49    CXR: FINDINGS:  There is underlying emphysematous change. There is patchy  atelectatic change in both lower lobes, stable. There is a rather  minimal right effusion. Elsewhere lungs are clear.  Heart is enlarged  with pulmonary vascularity within normal limits. No adenopathy.  Patient is status post aortic valve replacement.  IMPRESSION:  Stable bibasilar atelectatic change. Rather minimal right effusion.  Underlying emphysema. No new opacity. Stable cardiac prominence. No  apparent pneumothorax.   Assessment/Plan: S/P Procedure(s) (LRB): AORTIC VALVE REPLACEMENT (AVR) (N/A) CORONARY ARTERY BYPASS GRAFTING (CABG) x 1 - LIMA to LAD (N/A) ASCENDING AORTIC ROOT REPLACEMENT (N/A) INTRAOPERATIVE TRANSESOPHAGEAL ECHOCARDIOGRAM (N/A)  CV- SR, BPs stable. Continue current meds. Start ACE-I when BPs a little higher.  DM- hypoglycemic overnight. Po intake good.  Will d/c Levemir and resume Metformin. A1C=6.8  Vol overload- diurese.  Hypokalemia- replace K+.  Thrombocytopenia- plts stable.  Watch.  GI- LOC today.  CRPI, pulm toilet.    LOS: 3 days    [name] [name],[name] [name] 10/30/2013  I have seen and examined the patient and agree with the assessment and plan as outlined.  Making excellent progress.  Possible d/c home 1-2 days.   [name] [name] [name] 10/30/2013 11:31 AM

## 2013-10-31 ENCOUNTER — Encounter (HOSPITAL_COMMUNITY): Payer: Self-pay | Admitting: General Practice

## 2013-10-31 LAB — BASIC METABOLIC PANEL
Anion gap: 13 (ref 5–15)
BUN: 11 mg/dL (ref 6–23)
CO2: 26 mEq/L (ref 19–32)
Calcium: 8.4 mg/dL (ref 8.4–10.5)
Chloride: 101 mEq/L (ref 96–112)
Creatinine, Ser: 0.67 mg/dL (ref 0.50–1.10)
GFR calc Af Amer: >90 mL/min (ref >90)
GFR calc non Af Amer: 87 mL/min — ABNORMAL LOW (ref >90)
Glucose, Bld: 87 mg/dL (ref 70–99)
Potassium: 4.4 mEq/L (ref 3.7–5.3)
Sodium: 140 mEq/L (ref 137–147)

## 2013-10-31 LAB — GLUCOSE, CAPILLARY
Glucose-Capillary: 100 mg/dL — ABNORMAL HIGH (ref 70–99)
Glucose-Capillary: 114 mg/dL — ABNORMAL HIGH (ref 70–99)
Glucose-Capillary: 161 mg/dL — ABNORMAL HIGH (ref 70–99)

## 2013-10-31 MED ORDER — ASPIRIN 325 MG PO TBEC: 325.0000 mg | DELAYED_RELEASE_TABLET | Freq: Every day | ORAL | Status: DC

## 2013-10-31 MED ORDER — POTASSIUM CHLORIDE CRYS ER 20 MEQ PO TBCR: 20.0000 meq | EXTENDED_RELEASE_TABLET | Freq: Every day | ORAL | Status: DC

## 2013-10-31 MED ORDER — LISINOPRIL 2.5 MG PO TABS: 2.5000 mg | ORAL_TABLET | Freq: Every day | ORAL | Status: DC

## 2013-10-31 MED ORDER — OXYCODONE HCL 5 MG PO TABS: 5.0000 mg | ORAL_TABLET | ORAL | Status: DC | PRN

## 2013-10-31 MED ORDER — LISINOPRIL 2.5 MG PO TABS
2.5000 mg | ORAL_TABLET | Freq: Every day | ORAL | Status: DC
  Administered 2013-10-31: 2.5 mg via ORAL
  Filled 2013-10-31: qty 1

## 2013-10-31 MED ORDER — ATORVASTATIN CALCIUM 10 MG PO TABS: 10.0000 mg | ORAL_TABLET | Freq: Every day | ORAL | Status: DC

## 2013-10-31 MED ORDER — FUROSEMIDE 40 MG PO TABS: 40.0000 mg | ORAL_TABLET | Freq: Every day | ORAL | Status: DC

## 2013-10-31 MED ORDER — LACTULOSE 10 GM/15ML PO SOLN
20.0000 g | Freq: Once | ORAL | Status: AC
  Administered 2013-10-31: 20 g via ORAL
  Filled 2013-10-31: qty 30

## 2013-10-31 NOTE — Discharge Instructions (Signed)
Aortic Valve Replacement, Care After °Refer to this sheet in the next few weeks. These instructions provide you with information on caring for yourself after your procedure. Your health care provider may also give you specific instructions. Your treatment has been planned according to current medical practices, but problems sometimes occur. Call your health care provider if you have any problems or questions after your procedure. °HOME CARE INSTRUCTIONS  °· Take medicines only as directed by your health care provider. °· If your health care provider has prescribed elastic stockings, wear them as directed. °· Take frequent naps or rest often throughout the day. °· Avoid lifting over 10 lbs (4.5 kg) or pushing or pulling things with your arms for 6-8 weeks or as directed by your health care provider. °· Avoid driving or airplane travel for 4-6 weeks after surgery or as directed by your health care provider. If you are riding in a car for an extended period, stop every 1-2 hours to stretch your legs. Keep a record of your medicines and medical history with you when traveling. °· Do not drive or operate heavy machinery while taking pain medicine. (narcotics). °· Do not cross your legs. °· Do not use any tobacco products including cigarettes, chewing tobacco, or electronic cigarettes. If you need help quitting, ask your health care provider. °· Do not take baths, swim, or use a hot tub until your health care provider approves. Take showers once your health care provider approves. Pat incisions dry. Do not rub incisions with a washcloth or towel. °· Avoid climbing stairs and using the handrail to pull yourself up for the first 2-3 weeks after surgery. °· Return to work as directed by your health care provider. °· Drink enough fluid to keep your urine clear or pale yellow. °· Do not strain to have a bowel movement. Eat high-fiber foods if you become constipated. You may also take a medicine to help you have a bowel  movement (laxative) as directed by your health care provider. °· Resume sexual activity as directed by your health care provider. Men should not use medicines for erectile dysfunction until their doctor says it is okay. °· If you had a certain type of heart condition in the past, you may need to take antibiotic medicine before having dental work or surgery. Let your dentist and health care providers know if you had one or more of the following: °¨ Previous endocarditis. °¨ An artificial (prosthetic) heart valve. °¨ Congenital heart disease. °SEEK MEDICAL CARE IF: °· You develop a skin rash.   °· You experience sudden changes in your weight. °· You have a fever. °SEEK IMMEDIATE MEDICAL CARE IF:  °· You develop chest pain that is not coming from your incision. °· You have drainage (pus), redness, swelling, or pain at your incision site.   °· You develop shortness of breath or have difficulty breathing.   °· You have increased bleeding from your incision site.   °· You develop light-headedness.   °MAKE SURE YOU:  °· Understand these directions. °· Will watch your condition. °· Will get help right away if you are not doing well or get worse. °Document Released: 10/10/2004 Document Revised: 08/08/2013 Document Reviewed: 01/06/2012 °[employer]® Patient Information ©2015 [employer]. This information is not intended to replace advice given to you by your health care provider. Make sure you discuss any questions you have with your health care provider. ° °

## 2013-10-31 NOTE — Discharge Summary (Signed)
Physician Discharge Summary  Patient ID: [name] [name] MRN: [id number] DOB/AGE: 70/01/1946 70 y.o.  Admit date: 10/27/2013 Discharge date: 10/31/2013  Admission Diagnoses:  Patient Active Problem List   Diagnosis Date Noted  . Aortic insufficiency 10/10/2013  . Cardiomyopathy   . Chronic combined systolic and diastolic CHF, NYHA class 2   . Coronary artery disease 10/05/2013  . Aortic regurgitation   . Dilated aortic root   . PVC (premature ventricular contraction) 03/30/2013  . Essential hypertension, benign 03/30/2013   Discharge Diagnoses:   Patient Active Problem List   Diagnosis Date Noted  . S/P Bio-Bentall aortic root replacement with bioprosthetic valve conduit 10/27/2013  . S/P CABG x 1 10/27/2013  . Aortic insufficiency 10/10/2013  . Cardiomyopathy   . Chronic combined systolic and diastolic CHF, NYHA class 2   . Coronary artery disease 10/05/2013  . Aortic regurgitation   . Dilated aortic root   . PVC (premature ventricular contraction) 03/30/2013  . Essential hypertension, benign 03/30/2013   Discharged Condition: good  History of Present Illness:   [name] [name] is a 68 white female with long-standing history of aortic insufficiency and recently discovered single-vessel coronary artery disease.  She was referred to [facility] for possible elective aortic valve replacement and coronary artery bypass grafting. The patient states that she has had a known heart murmur for more than 20 years and she was first diagnosed with aortic insufficiency 6 or 7 years ago. She has been followed by Dr. Radford Pax for several years with what was felt to be moderate aortic insufficiency, hypertension, and dilated aortic root. Echocardiogram performed June of 2014 revealed moderate aortic insufficiency, mild mitral regurgitation, and normal left ventricular size and systolic function with ejection fraction estimated at 55%. Over the last 2 months the patient has developed symptoms of exertional  shortness of breath. Follow up echocardiogram performed 09/28/2013 again revealed what was felt to be moderate aortic insufficiency, but there was significant drop in left ventricular function with ejection fraction estimated 35-40%. Transesophageal echocardiogram was performed 10/04/2013. This demonstrated what appeared to be severe aortic insufficiency with an eccentric jet of regurgitation. There was moderate global left ventricular systolic dysfunction, mild mitral regurgitation, and mild tricuspid regurgitation. The aortic root was dilated. Cardiac catheterization was performed 10/05/2013 by Dr. Burt Knack. This reveals severe single-vessel coronary artery disease with high-grade stenosis of the mid left anterior descending coronary artery. There was dilated aortic root with severe aortic insufficiency and mild global left ventricular systolic dysfunction. Right sided pressures were normal.  She was evaluated by Dr. Roxy Manns on 10/10/2013 at which time it was felt the patient would benefit from CABG x 1 and a Bentall procedure.  The risks and benefits of the procedure were explained to the patient and her family and she was agreeable to proceed.   Hospital Course:   [name] [name] presented to [facility] on 10/27/2013.  She was taken to the operating room and underwent CABG x 1 utilizing LIMA to LAD and a Biologic Bentall Aortic Root Replacement utilizing a 23 mm Edwards Magna Ease pericardial tissue valve and a 26 mm Vascutek Gelweave Valsalva Aortic Graft.  The patient tolerated the procedure without difficulty and was taken to the SICU in stable condition.  She was extubated the evening of surgery.  During her stay in the SICU the patient was weaned off cardiac drips as tolerated.  Her chest tubes and arterial lines were removed without difficulty.  She was maintaining NSR.  She was ambulating without  difficulty and felt medically stable for transfer to the telemetry unit.  The patient developed hypoglycemia.   She was taken off insulin and her home regimen of Metformin was restarted with improvement of sugars.  She developed post operative thrombocytopenia which was slowly improving.  She continued to maintain NSR and her pacing wires were removed without difficulty.  She has been started on a low dose ACE-I for SBP of 130.  She continues to progress.  She is ambulating without difficulty.  She is tolerating a carb modified diet.  She is felt to be medically stable for discharge today 10/31/2013.  She will follow up with Dr. Roxy Manns in 4 weeks with a CXR prior to her appointment.  She will also follow up with her Cardiologist in 2 weeks.     Significant Diagnostic Studies: radiology:    Echocardiogram:  - Left ventricle: The cavity size was mildly dilated. Systolic function was moderately to severely reduced. The estimated ejection fraction was in the range of 30% to 35%. Wall motion was normal; there were no regional wall motion abnormalities. - Aortic valve: The AV is trileaflet and mildly thickened. In the 105 degree view the there is a density that intermittently becomes visible with leaflet coaptation but is not seen at all times and not seen any other views. Unlikely to represent vegetation. There is a long eccentric jet of AR Mild thickening. There was severe regurgitation directed eccentrically in the LVOT. - Mitral valve: There was mild regurgitation. - Left atrium: The atrium was mildly dilated. No evidence of thrombus in the atrial cavity or appendage. - Right atrium: No evidence of thrombus in the atrial cavity or appendage.  Angiography: Hemodynamics  RA mean of 4  RV 27/6  PA 24/10 with a mean of 17  PCWP mean of 8  LV 157/13  AO 146/66 with a mean 98  Oxygen saturations:  PA 70  AO 92  Cardiac Output (Fick) 5.5  Cardiac Index (Fick) 3.1  Coronary angiography:  Coronary dominance: right  Left mainstem: The left main is calcified. The proximal and mid left main are widely  patent. The distal left main has 20-30% stenosis.  Left anterior descending (LAD): The LAD has mild calcification. The proximal vessel is widely patent with minor irregularity. The diagonal branches are widely patent. The mid LAD just after the second diagonal has a tight 90% stenosis. This is a focal lesion.  Left circumflex (LCx): The left circumflex is patent. The first obtuse marginal branch is widely patent with no obstruction noted.  Right coronary artery (RCA): The right coronary artery is dominant. The vessel has mild diffuse calcification. The mid vessel has 20-30% stenosis. There is minor irregularity noted throughout the proximal, mid, and distal vessel. The PDA and PLA branches are widely patent.  Left ventriculography: There is mild global cardiomyopathy noted with an LVEF estimated at about 40%. There is no significant mitral regurgitation.  Aortic root angiography: The proximal ascending aorta is dilated. There is severe aortic valve insufficiency noted.  Treatments: surgery:   Biological Bentall Aortic Root Replacement Edwards Magna Ease Pericardial Tissue Valve (size 28mm, model # 3300TFX, serial # [id number])  Vascutek Gelweave Valsalva aortic graft (size 66mm, lot #[id number])  Coronary Artery Bypass Grafting x 1 Left Internal Mammary Artery to Distal Left Anterior Descending Coronary Artery  Disposition: 01-Home or Self Care  Discharge Medications:    Medication List    STOP taking these medications       ALEVE 220 MG  tablet  Generic drug:  naproxen sodium     indomethacin 25 MG capsule  Commonly known as:  INDOCIN      TAKE these medications       aspirin 325 MG EC tablet  Take 1 tablet (325 mg total) by mouth daily.     atorvastatin 10 MG tablet  Commonly known as:  LIPITOR  Take 1 tablet (10 mg total) by mouth daily at 6 PM.     CALTRATE 600 PLUS-VIT D PO  Take 1 tablet by mouth daily.     carvedilol 6.25 MG tablet  Commonly known as:  COREG  Take 6.25 mg  by mouth 2 (two) times daily with a meal.     COLCRYS 0.6 MG tablet  Generic drug:  colchicine  Take 0.6 mg by mouth daily.     furosemide 40 MG tablet  Commonly known as:  LASIX  Take 1 tablet (40 mg total) by mouth daily.     GLUCOSAMINE-CHONDROITIN PO  Take 1 tablet by mouth daily. Glucosamine 1200 mg, chondroitin 1500 mg     lisinopril 2.5 MG tablet  Commonly known as:  PRINIVIL,ZESTRIL  Take 1 tablet (2.5 mg total) by mouth daily.     metFORMIN 500 MG 24 hr tablet  Commonly known as:  GLUCOPHAGE-XR  Take 500 mg by mouth daily with breakfast.     multivitamin with minerals Tabs tablet  Take 1 tablet by mouth daily. Centrum Silver     oxyCODONE 5 MG immediate release tablet  Commonly known as:  Oxy IR/ROXICODONE  Take 1-2 tablets (5-10 mg total) by mouth every 4 (four) hours as needed for moderate pain.     potassium chloride SA 20 MEQ tablet  Commonly known as:  K-DUR,KLOR-CON  Take 1 tablet (20 mEq total) by mouth daily.     REFRESH OP  Place 1 drop into both eyes daily as needed (dry eyes).       The patient has been discharged on:   1.Beta Blocker:  Yes [ x  ]                              No   [   ]                              If No, reason:  2.Ace Inhibitor/ARB: Yes [ x  ]                                     No  [    ]                                     If No, reason:  3.Statin:   Yes [ x  ]                  No  [   ]                  If No, reason:  4.Ecasa:  Yes  [  x ]                  No   [   ]  If No, reason:       Follow-up Information   Follow up with Rexene Alberts, MD On 11/28/2013. (Appointment is at 10:30)    Specialty:  Cardiothoracic Surgery   Contact information:   [address] [place] [address] [phone]       Follow up with [facility] On 11/28/2013. (Please get CXR at 9:30)    Contact information:   [place]       Signed: BARRETT, ERIN 10/31/2013, 9:08 AM

## 2013-10-31 NOTE — Progress Notes (Signed)
EPW  Removed per protocol, intact and without difficulty,  pt tolerated well. Instructed bedrest for 1 hour. Routine post removal vitals began, 107/67 [name] [name]

## 2013-10-31 NOTE — Progress Notes (Addendum)
      [address]       [place] [address]             [phone]      4 Days Post-Op Procedure(s) (LRB): AORTIC VALVE REPLACEMENT (AVR) (N/A) CORONARY ARTERY BYPASS GRAFTING (CABG) x 1 - LIMA to LAD (N/A) ASCENDING AORTIC ROOT REPLACEMENT (N/A) INTRAOPERATIVE TRANSESOPHAGEAL ECHOCARDIOGRAM (N/A)  Subjective:  Ms. Cortez has no new complaints this morning.  She was hoping to be discharged home today, however she has not moved her bowels.   Objective: Vital signs in last 24 hours: Temp:  [97.3 F (36.3 C)-99 F (37.2 C)] 98.3 F (36.8 C) (07/27 0541) Pulse Rate:  [81-82] 82 (07/27 0541) Cardiac Rhythm:  [-] Normal sinus rhythm (07/26 2025) Resp:  [19-21] 21 (07/27 0541) BP: (103-129)/(61-74) 129/74 mmHg (07/27 0541) SpO2:  [94 %-95 %] 95 % (07/27 0541) Weight:  [172 lb 1.6 oz (78.064 kg)] 172 lb 1.6 oz (78.064 kg) (07/27 0541)  Intake/Output from previous day: 07/26 0701 - 07/27 0700 In: 600 [P.O.:600] Out: 400 [Urine:400]  General appearance: alert, cooperative and no distress Heart: regular rate and rhythm Lungs: clear to auscultation bilaterally Abdomen: soft, non-tender; bowel sounds normal; no masses,  no organomegaly Extremities: edema trace Wound: clean and dry  Lab Results:  Recent Labs  10/29/13 0414 10/30/13 0505  WBC 14.8* 11.9*  HGB 10.0* 10.6*  HCT 29.1* 31.6*  PLT 90* 95*   BMET:  Recent Labs  10/30/13 0505 10/31/13 0540  NA 135* 140  K 3.5* 4.4  CL 100 101  CO2 23 26  GLUCOSE 59* 87  BUN 11 11  CREATININE 0.63 0.67  CALCIUM 8.2* 8.4    PT/INR: No results found for this basename: LABPROT, INR,  in the last 72 hours ABG    Component Value Date/Time   PHART 7.408 10/28/2013 0500   HCO3 23.0 10/28/2013 0500   TCO2 22 10/28/2013 1956   ACIDBASEDEF 1.0 10/28/2013 0500   O2SAT 98.1 10/28/2013 0500   CBG (last 3)   Recent Labs  10/30/13 1634 10/30/13 2110 10/31/13 0555  GLUCAP 124* 102* 100*     Assessment/Plan: S/P Procedure(s) (LRB): AORTIC VALVE REPLACEMENT (AVR) (N/A) CORONARY ARTERY BYPASS GRAFTING (CABG) x 1 - LIMA to LAD (N/A) ASCENDING AORTIC ROOT REPLACEMENT (N/A) INTRAOPERATIVE TRANSESOPHAGEAL ECHOCARDIOGRAM (N/A)  1. CV- NSR, BP creeping up this morning to 130s- will continue Coreg, add low dose ACE 2. Pulm- no acute issues, encouraged continued IS 3. Renal- creatinine WNL, volume status is 2lbs above baseline, no significant LE edema- on Lasix 40 mg QD 4. GI- constipation- small dose of Lactulose given yesterday without relief, will repeat with increased dose today as well as dulcolax suppository 5. DM- hypoglycemia improved with discontinuation of insulin, continue Metformin 6. Dispo- patient stable, will repeat Lactulose today, home this afternoon if moves bowels   LOS: 4 days    BARRETT, ERIN 10/31/2013  I have seen and examined the patient and agree with the assessment and plan as outlined.  D/C home later today or tomorrow.  Maxima Skelton H 10/31/2013 8:30 AM

## 2013-10-31 NOTE — Progress Notes (Signed)
CARDIAC REHAB PHASE I   Pt has already walked x2 this am, without RW. Feels well, moving well. Ed completed with pt. Voiced understanding and requests her name be sent to [facility]. Set up d/c video. [id number] [name] [name] Kristan CES, ACSM 10/31/2013 9:00 AM

## 2013-10-31 NOTE — Progress Notes (Signed)
Pt report having " 1 good BM today"  [name] [name]

## 2013-10-31 NOTE — Progress Notes (Signed)
Reviewed discharge instructions with patient and husband, questions answered. Dc home with belongings, prescriptions. Husband to provide transportation home [place]

## 2013-11-02 LAB — TYPE AND SCREEN
ABO/RH(D): O NEG
Antibody Screen: POSITIVE
DAT, IgG: NEGATIVE
Donor AG Type: NEGATIVE
Donor AG Type: NEGATIVE
Donor AG Type: NEGATIVE
Donor AG Type: NEGATIVE
PT AG Type: NEGATIVE
Unit division: 0
Unit division: 0
Unit division: 0
Unit division: 0

## 2013-11-03 ENCOUNTER — Telehealth: Payer: Self-pay | Admitting: Cardiology

## 2013-11-03 ENCOUNTER — Telehealth: Payer: Self-pay | Admitting: *Deleted

## 2013-11-03 ENCOUNTER — Other Ambulatory Visit: Payer: Self-pay | Admitting: General Surgery

## 2013-11-03 MED ORDER — LOSARTAN POTASSIUM 25 MG PO TABS: 25.0000 mg | ORAL_TABLET | Freq: Every day | ORAL | Status: DC

## 2013-11-03 NOTE — Telephone Encounter (Signed)
Stop Lisinopril and change her to Losartan 25mg  daily and check her BP daily for a week and call with the results

## 2013-11-03 NOTE — Telephone Encounter (Signed)
Pt is aware. New Rx called in for pt. Med list updated.

## 2013-11-03 NOTE — Telephone Encounter (Signed)
To Dr Turner to advise 

## 2013-11-03 NOTE — Telephone Encounter (Signed)
New message           C/o Lisinopril making her cough / bp has been high for a couple of days / Pt has started back taking metformin / is this normal

## 2013-11-03 NOTE — Telephone Encounter (Signed)
Pt is aware.  

## 2013-11-03 NOTE — Telephone Encounter (Signed)
[name] [name] was recently discharged s/p CABG/AVR.  She is c/o cough and elevated blood sugars.  She was prescribed Lisinopril on discharge. Her blood sugar today is 233. I advised her to call her cardiologist regarding her cough, possibly due to her new med, Lisinopril.  I also told her to call her PCP and get advice and management of her blood sugars.  She agreed.

## 2013-11-04 ENCOUNTER — Telehealth: Payer: Self-pay | Admitting: Cardiology

## 2013-11-04 NOTE — Telephone Encounter (Signed)
New message     Bp this morning is 100/65 and pulse 96.  She feels like her insides are jumping out.  Please advise

## 2013-11-04 NOTE — Telephone Encounter (Signed)
I have spoken with pt. She was discharged Monday from [facility] after valvular surgery & bypass grafting. States she has not really been in any pain taking 1 oxycodone at bedtime.  1.) Weights:  Tuesday: 160                       Wednesday:  No weight                       Thursday:  160                       Friday:  No weight 2.) blood pressure & heart rate:  100/65  96 3.) Edema:  Denies any edema of the extremities or abdomen 4.) Respirations: " a little " shortness of breath but states it is better than before surgery.  No cough  She has not started the Losartan. Will do so today 5.) incisions: no drainage noted, pink around the incisions without signs of inflammation or infection according to patient 6.) Temperature: no fever 7.) feeling nervous inside 8.) blood sugars higher than normal:  158 today. States Dr. Felipa Eth is following this & she is to report cbgs to their office today 9.) return office visit appointment: scheduled with Dr. Radford Pax, Suture removal at [facility] 11/08/13 10.) appetite:  Better than in the hospital, has eaten breakfast  Pt does state that she has felt great one day then the next not so good. Reassurance given. Explained & reassured also that it is normal for the blood sugar to elevate & the heart rate & blood pressure during the healing process & in dealing with pain or soreness. She states she does not have pain. I asked about soreness & mobility. She is sore.  She will take extra strength tylenol to help with the soreness. She does remember being given tylenol ES in the hospital Reassurance given.   Horton Chin RN

## 2013-11-04 NOTE — Telephone Encounter (Signed)
Please have her hold Losartan for now since her BP is running on the low normal side

## 2013-11-04 NOTE — Telephone Encounter (Signed)
Daughter calls b/c we have not called pt back as soon as  She would have liked. States her mother just feels awful & was discharged this week after [facility] I have tried to reassure the daughter & am calling pt back

## 2013-11-07 NOTE — Addendum Note (Signed)
Addended by: Lily Kocher on: 11/07/2013 01:13 PM   Modules accepted: Medications

## 2013-11-07 NOTE — Telephone Encounter (Signed)
Pt is aware. Will update med list.

## 2013-11-08 ENCOUNTER — Encounter (INDEPENDENT_AMBULATORY_CARE_PROVIDER_SITE_OTHER): Payer: Self-pay

## 2013-11-08 DIAGNOSIS — I2510 Atherosclerotic heart disease of native coronary artery without angina pectoris: Secondary | ICD-10-CM

## 2013-11-08 DIAGNOSIS — I359 Nonrheumatic aortic valve disorder, unspecified: Secondary | ICD-10-CM

## 2013-11-22 ENCOUNTER — Encounter: Payer: Self-pay | Admitting: Cardiology

## 2013-11-22 ENCOUNTER — Ambulatory Visit (INDEPENDENT_AMBULATORY_CARE_PROVIDER_SITE_OTHER): Payer: Medicare Other | Admitting: Cardiology

## 2013-11-22 VITALS — BP 124/73 | HR 81 | Ht 61.0 in | Wt 164.8 lb

## 2013-11-22 DIAGNOSIS — I2510 Atherosclerotic heart disease of native coronary artery without angina pectoris: Secondary | ICD-10-CM

## 2013-11-22 DIAGNOSIS — I351 Nonrheumatic aortic (valve) insufficiency: Secondary | ICD-10-CM

## 2013-11-22 DIAGNOSIS — I493 Ventricular premature depolarization: Secondary | ICD-10-CM

## 2013-11-22 DIAGNOSIS — I509 Heart failure, unspecified: Secondary | ICD-10-CM

## 2013-11-22 DIAGNOSIS — I359 Nonrheumatic aortic valve disorder, unspecified: Secondary | ICD-10-CM

## 2013-11-22 DIAGNOSIS — I77810 Thoracic aortic ectasia: Secondary | ICD-10-CM

## 2013-11-22 DIAGNOSIS — I4949 Other premature depolarization: Secondary | ICD-10-CM

## 2013-11-22 DIAGNOSIS — I428 Other cardiomyopathies: Secondary | ICD-10-CM

## 2013-11-22 DIAGNOSIS — Z952 Presence of prosthetic heart valve: Secondary | ICD-10-CM

## 2013-11-22 DIAGNOSIS — Z953 Presence of xenogenic heart valve: Secondary | ICD-10-CM

## 2013-11-22 DIAGNOSIS — I5042 Chronic combined systolic (congestive) and diastolic (congestive) heart failure: Secondary | ICD-10-CM

## 2013-11-22 DIAGNOSIS — I429 Cardiomyopathy, unspecified: Secondary | ICD-10-CM

## 2013-11-22 DIAGNOSIS — I10 Essential (primary) hypertension: Secondary | ICD-10-CM

## 2013-11-22 MED ORDER — CARVEDILOL 6.25 MG PO TABS: 6.2500 mg | ORAL_TABLET | Freq: Two times a day (BID) | ORAL | Status: DC

## 2013-11-22 MED ORDER — ATORVASTATIN CALCIUM 10 MG PO TABS: 10.0000 mg | ORAL_TABLET | Freq: Every day | ORAL | Status: DC

## 2013-11-22 NOTE — Patient Instructions (Signed)
Your physician recommends that you continue on your current medications as directed. Please refer to the Current Medication list given to you today.  Your physician has requested that you have an echocardiogram. Echocardiography is a painless test that uses sound waves to create images of your heart. It provides your doctor with information about the size and shape of your heart and how well your heart's chambers and valves are working. This procedure takes approximately one hour. There are no restrictions for this procedure.  Your physician recommends that you schedule a follow-up appointment in: 3 months with Dr. Turner.  

## 2013-11-22 NOTE — Progress Notes (Signed)
[address] [place],   [address] Phone: [phone] Fax:  [phone]  Date:  11/22/2013   ID:  [name], [name] [birth year], MRN [id number]  PCP:  Mathews Argyle, MD  Cardiologist:  Fransico Him, MD     History of Present Illness: [name] [name] is a 68 y.o. female with a history of PVC's, dilated aortic root, moderate AR, HTN who p resents today for followup. Since I saw her in January of 2015 she had an echo done which showed moderately reduced LVF with mild AS and moderate AR with dilated aortic root.  She underwent TEE showing severe LV dysfunction EF 25% with moderate to severe AR and dilated aorta.  She was referred to [facility] surgery and underwent cath showing severe mid LAD stenosis but otherwise nonobstructive ASCAD.  She subequently underwent Bentall Aortic Root replacment and pericardial tissue AVR with 1 vessel CABG with LIMA to LAD.  She is doing well. She denies any chest pain, LE edema, dizziness, or syncope. She still has some SOB but it is improved.  She has started back walking some which is limited by fatigue.    Wt Readings from Last 3 Encounters:  11/22/13 164 lb 12.8 oz (74.753 kg)  10/31/13 172 lb 1.6 oz (78.064 kg)  10/31/13 172 lb 1.6 oz (78.064 kg)     Past Medical History  Diagnosis Date  . Gout   . Diabetes mellitus without complication   . Hypertension   . Hypercholesteremia     LDL goal < 100  . Thyroiditis 10/2009    lab and u/s-thyroid function normalized in 9/11  . Adenomatous colon polyp   . Aortic regurgitation     severe by TEE  . Dilated aortic root   . Gout   . Coronary artery disease 10/05/2013    High grade LAD stenosis  . Cardiomyopathy   . Chronic combined systolic and diastolic CHF, NYHA class 2   . Complication of anesthesia   . Heart murmur   . Shortness of breath     with exertion  . GERD (gastroesophageal reflux disease)     Tums prn  . Arthritis   . S/P Bio-Bentall aortic root replacement with  bioprosthetic valve conduit 10/27/2013    23 mm [facility] Ease bovine pericardial tissue valve and 26 mm Vascutek gelweave Val-Salva aortic graft  . S/P CABG x 1 10/27/2013    LIMA to LAD    Current Outpatient Prescriptions  Medication Sig Dispense Refill  . aspirin EC 325 MG EC tablet Take 1 tablet (325 mg total) by mouth daily.      Marland Kitchen atorvastatin (LIPITOR) 10 MG tablet Take 1 tablet (10 mg total) by mouth daily at 6 PM.  30 tablet  1  . Calcium-Vitamin D (CALTRATE 600 PLUS-VIT D PO) Take 1 tablet by mouth daily.      . carvedilol (COREG) 6.25 MG tablet Take 6.25 mg by mouth 2 (two) times daily with a meal.      . colchicine (COLCRYS) 0.6 MG tablet Take 0.6 mg by mouth daily.      Marland Kitchen GLUCOSAMINE-CHONDROITIN PO Take 1 tablet by mouth daily. Glucosamine 1200 mg, chondroitin 1500 mg      . metFORMIN (GLUCOPHAGE-XR) 500 MG 24 hr tablet Take 500 mg by mouth daily with breakfast.      . Multiple Vitamin (MULTIVITAMIN WITH MINERALS) TABS tablet Take 1 tablet by mouth daily. Centrum Silver  No current facility-administered medications for this visit.    Allergies:    Allergies  Allergen Reactions  . Atenolol Shortness Of Breath and Other (See Comments)    Edema and Headache  . Lisinopril Cough  . Penicillins Other (See Comments)    G Benzathine: Local injection rash  . Sulfa Antibiotics Rash    Social History:  The patient  reports that she has never smoked. She has never used smokeless tobacco. She reports that she does not drink alcohol or use illicit drugs.   Family History:  The patient's family history is not on file.   ROS:  Please see the history of present illness.      All other systems reviewed and negative.   PHYSICAL EXAM: VS:  BP 124/73  Pulse 81  Ht 5\' 1"  (1.549 m)  Wt 164 lb 12.8 oz (74.753 kg)  BMI 31.15 kg/m2 Well nourished, well developed, in no acute distress HEENT: normal Neck: no JVD Cardiac:  normal S1, S2; RRR; no murmur Lungs:  clear to  auscultation bilaterally, no wheezing, rhonchi or rales Abd: soft, nontender, no hepatomegaly Ext: no edema Skin: warm and dry Neuro:  CNs 2-12 intact, no focal abnormalities noted      ASSESSMENT AND PLAN:  1. ASCAD 1 vessel s/p LIMA to LAD 2. Moderate to severe AR with dilated aortic root s/p Bentall procedure with pericardial tissue valve 3. Severe LV dysfunction most likely secondary to #2 - recheck 2D echo to assess LVF - continue carvedilol - no ACE I/ARB due to cough and hypotension 4. HTN - well controlled - continue carvedilol 5. Chronic combined systolic/diastolic CHF - appears compensated   Followup with me in 3 months  Signed, Fransico Him, MD 11/22/2013 3:42 PM

## 2013-11-23 ENCOUNTER — Other Ambulatory Visit: Payer: Self-pay | Admitting: Thoracic Surgery (Cardiothoracic Vascular Surgery)

## 2013-11-23 DIAGNOSIS — I77810 Thoracic aortic ectasia: Secondary | ICD-10-CM

## 2013-11-28 ENCOUNTER — Ambulatory Visit (INDEPENDENT_AMBULATORY_CARE_PROVIDER_SITE_OTHER): Payer: Self-pay | Admitting: Thoracic Surgery (Cardiothoracic Vascular Surgery)

## 2013-11-28 ENCOUNTER — Ambulatory Visit
Admission: RE | Admit: 2013-11-28 | Discharge: 2013-11-28 | Disposition: A | Discharge status: Home / Self Care | Payer: Medicare Other | Source: Ambulatory Visit | Attending: Thoracic Surgery (Cardiothoracic Vascular Surgery) | Admitting: Thoracic Surgery (Cardiothoracic Vascular Surgery)

## 2013-11-28 ENCOUNTER — Ambulatory Visit (HOSPITAL_COMMUNITY): Payer: Medicare Other | Attending: Cardiovascular Disease

## 2013-11-28 ENCOUNTER — Encounter: Payer: Self-pay | Admitting: Thoracic Surgery (Cardiothoracic Vascular Surgery)

## 2013-11-28 VITALS — BP 127/80 | HR 80 | Ht 61.0 in | Wt 164.0 lb

## 2013-11-28 DIAGNOSIS — I519 Heart disease, unspecified: Secondary | ICD-10-CM | POA: Diagnosis not present

## 2013-11-28 DIAGNOSIS — I359 Nonrheumatic aortic valve disorder, unspecified: Secondary | ICD-10-CM

## 2013-11-28 DIAGNOSIS — Z952 Presence of prosthetic heart valve: Secondary | ICD-10-CM

## 2013-11-28 DIAGNOSIS — I351 Nonrheumatic aortic (valve) insufficiency: Secondary | ICD-10-CM

## 2013-11-28 DIAGNOSIS — Z951 Presence of aortocoronary bypass graft: Secondary | ICD-10-CM

## 2013-11-28 DIAGNOSIS — Z953 Presence of xenogenic heart valve: Secondary | ICD-10-CM

## 2013-11-28 DIAGNOSIS — I77810 Thoracic aortic ectasia: Secondary | ICD-10-CM

## 2013-11-28 NOTE — Progress Notes (Signed)
[address]       [place] [address]             [phone]      CARDIOTHORACIC SURGERY OFFICE NOTE  Referring Provider is Sueanne Margarita, MD PCP is Mathews Argyle, MD   HPI:  Patient returns for followup status post biological Bentall aortic root replacement using a bovine bioprosthetic tissue valve and synthetic aortic graft conduit with coronary artery bypass grafting x1 on 10/27/2013 for underlying likely rheumatic heart disease with severe symptomatic aortic insufficiency and moderate-severe global left ventricular systolic dysfunction.  The patient's postoperative recovery in the hospital was uncomplicated and she was discharged from the hospital on the fourth postoperative day. Because of pre-existing left ventricular dysfunction with chronic diastolic congestive heart failure, the patient was sent home on low-dose ACE inhibitor and beta blocker.  However, the patient developed persistent cough and relatively low blood pressure, so her ACE inhibitor was stopped by Dr. Radford Pax. She was seen in followup by Dr. Radford Pax last week at which time she was improving nicely. She returns to our office for routine followup today. She states that she is doing well. She still gets short of breath and tired with exertion, but overall this is slowly improving. She has mild residual soreness in her chest. She has not been taking any sort of pain relievers. She is sleeping well at night. Her appetite is improving. Her cough is resolved. Overall she is pleased with her progress.  She has had some mild indigestion for which she plans to start treatment with an over-the-counter H2 blocker per Dr. Sherryll Burger recommendation.   Current Outpatient Prescriptions  Medication Sig Dispense Refill  . aspirin EC 325 MG EC tablet Take 1 tablet (325 mg total) by mouth daily.      Marland Kitchen atorvastatin (LIPITOR) 10 MG tablet Take 1 tablet (10 mg total) by mouth daily at 6 PM.  90 tablet  3  .  Calcium-Vitamin D (CALTRATE 600 PLUS-VIT D PO) Take 1 tablet by mouth daily.      . carvedilol (COREG) 6.25 MG tablet Take 1 tablet (6.25 mg total) by mouth 2 (two) times daily with a meal.  180 tablet  3  . colchicine (COLCRYS) 0.6 MG tablet Take 0.6 mg by mouth daily.      Marland Kitchen GLUCOSAMINE-CHONDROITIN PO Take 1 tablet by mouth daily. Glucosamine 1200 mg, chondroitin 1500 mg      . metFORMIN (GLUCOPHAGE-XR) 500 MG 24 hr tablet Take 500 mg by mouth daily with breakfast.      . Multiple Vitamin (MULTIVITAMIN WITH MINERALS) TABS tablet Take 1 tablet by mouth daily. Centrum Silver       No current facility-administered medications for this visit.      Physical Exam:   BP 127/80  Pulse 80  Ht 5\' 1"  (1.549 m)  Wt 164 lb (74.39 kg)  BMI 31.00 kg/m2  SpO2 97%  General:  Well-appearing  Chest:   Clear to auscultation  CV:   Regular rate and rhythm without murmur  Incisions:  Clean and dry and healing nicely, sternum is stable  Abdomen:  Soft nontender  Extremities:  Warm and well-perfused, no lower extremity edema  Diagnostic Tests:  CHEST 2 VIEW  COMPARISON: 10/30/2013  FINDINGS:  Sternotomy wires overlie normal cardiac silhouette. There is  improved in the bibasilar atelectasis seen on prior exam. No  effusion, infiltrate, or pneumothorax.  IMPRESSION:  1. No acute cardiopulmonary process.  2.  Improvement in basilar atelectasis seen on most recent comparison  exam.  Electronically Signed  By: Suzy Bouchard M.D.  On: 11/28/2013 09:23      Transthoracic Echocardiography  Patient: Cerra, Eisenhower MR #: [id number] Study Date: 11/28/2013 Gender: F Age: 29 Height: 154.9 cm Weight: 74.4 kg BSA: 1.82 m^2 Pt. Status: Room:  ORDERING Fransico Him, MD REFERRING Fransico Him, MD ATTENDING Mertie Moores, M.D. SONOGRAPHER Wyatt Mage, RDCS PERFORMING [facility], Outpatient  cc:  ------------------------------------------------------------------- LV EF: 20% -  25%  ------------------------------------------------------------------- Indications: Aortic stenosis /insufficiency 424.1.  ------------------------------------------------------------------- History: PMH: Dyspnea and murmur. Coronary artery disease. Congestive heart failure. Myocarditis. The causative organism is unknown. Risk factors: Gout. Thyroiditis. Dilated aortic root. GERD. Hypertension. Diabetes mellitus. Dyslipidemia.  ------------------------------------------------------------------- Study Conclusions  - Left ventricle: The cavity size was normal. Wall thickness was normal. Systolic function was severely reduced. The estimated ejection fraction was in the range of 20% to 25%. Diffuse hypokinesis. Doppler parameters are consistent with abnormal left ventricular relaxation (grade 1 diastolic dysfunction). Doppler parameters are consistent with high ventricular filling pressure. - Aortic valve: A bioprosthesis was present. - Mitral valve: Calcified annulus. There was mild regurgitation. - Right ventricle: Systolic function was mildly reduced. - Atrial septum: There was an atrial septal aneurysm.  Impressions:  - Severe global reduction in LV function; grade 1 diastolic dysfunction; s/p AVR with normal gradients (peak velocity of 2.2 m/s and mean gradient of 9 mmHg); mild MR.  ------------------------------------------------------------------- Labs, prior tests, procedures, and surgery: Transthoracic echocardiography (09/28/2013). EF was 40%.  Coronary artery bypass grafting (10/27/2013). Valve surgery (10/27/2013). Aortic valve replacement with a bioprosthetic valve. Aortic repair (10/27/2013). Aortic root replacement. Transthoracic echocardiography. M-mode, complete 2D, spectral Doppler, and color Doppler. Birthdate: Patient birthdate: 04-04-1946. Age: Patient is 68 yr old. Sex: Gender: female. BMI: 31 kg/m^2. Blood pressure: 124/73 Patient status: Outpatient.  Study date: Study date: 11/28/2013. Study time: 07:20 AM. Location: [facility]  -------------------------------------------------------------------  ------------------------------------------------------------------- Left ventricle: The cavity size was normal. Wall thickness was normal. Systolic function was severely reduced. The estimated ejection fraction was in the range of 20% to 25%. Diffuse hypokinesis. Doppler parameters are consistent with abnormal left ventricular relaxation (grade 1 diastolic dysfunction). Doppler parameters are consistent with high ventricular filling pressure.  ------------------------------------------------------------------- Aortic valve: Mildly thickened leaflets. A bioprosthesis was present. Mobility was not restricted. Doppler: Transvalvular velocity was within the normal range. There was no stenosis. There was no regurgitation. VTI ratio of LVOT to aortic valve: 0.44. Peak velocity ratio of LVOT to aortic valve: 0.47. Mean velocity ratio of LVOT to aortic valve: 0.46. Mean gradient (S): 11 mm Hg. Peak gradient (S): 19 mm Hg.  ------------------------------------------------------------------- Aorta: Aortic root: The aortic root was normal in size.  ------------------------------------------------------------------- Mitral valve: Calcified annulus. Mobility was not restricted. Doppler: Transvalvular velocity was within the normal range. There was no evidence for stenosis. There was mild regurgitation. Peak gradient (D): 2 mm Hg.  ------------------------------------------------------------------- Left atrium: The atrium was normal in size.  ------------------------------------------------------------------- Atrial septum: There was an atrial septal aneurysm.  ------------------------------------------------------------------- Right ventricle: The cavity size was normal. Systolic function was mildly  reduced.  ------------------------------------------------------------------- Pulmonic valve: Doppler: Transvalvular velocity was within the normal range. There was no evidence for stenosis.  ------------------------------------------------------------------- Tricuspid valve: Structurally normal valve. Doppler: Transvalvular velocity was within the normal range. There was trivial regurgitation.  ------------------------------------------------------------------- Pulmonary artery: Systolic pressure was within the normal range.  ------------------------------------------------------------------- Right atrium: The atrium was normal in size.  ------------------------------------------------------------------- Pericardium: There was  no pericardial effusion.  ------------------------------------------------------------------- Systemic veins: Inferior vena cava: The vessel was normal in size.  ------------------------------------------------------------------- Measurements  Left ventricle Value Reference LV ID, ED, PLAX chordal 49 mm 43 - 52 LV ID, ES, PLAX chordal (H) 42 mm 23 - 38 LV fx shortening, PLAX chordal (L) 14 % >=29 LV PW thickness, ED 11 mm --------- IVS/LV PW ratio, ED 1 <=1.3 LV e&', lateral 7.81 cm/s --------- LV E/e&', lateral 9.69 --------- LV e&', medial 4.68 cm/s --------- LV E/e&', medial 16.18 --------- LV e&', average 6.25 cm/s --------- LV E/e&', average 12.12 ---------  Ventricular septum Value Reference IVS thickness, ED 11 mm ---------  LVOT Value Reference LVOT peak velocity, S 101 cm/s --------- LVOT mean velocity, S 71.1 cm/s --------- LVOT VTI, S 20.7 cm ---------  Aortic valve Value Reference Aortic valve peak velocity, S 216 cm/s --------- Aortic valve mean velocity, S 155 cm/s --------- Aortic valve VTI, S 47 cm --------- Aortic mean gradient, S 11 mm Hg --------- Aortic peak gradient, S 19 mm Hg --------- VTI ratio, LVOT/AV 0.44  --------- Velocity ratio, peak, LVOT/AV 0.47 --------- Velocity ratio, mean, LVOT/AV 0.46 ---------  Aorta Value Reference Aortic root ID, ED 33.9 mm ---------  Left atrium Value Reference LA ID, A-P, ES 34 mm --------- LA ID/bsa, A-P 1.87 cm/m^2 <=2.2 LA volume, S 52.2 ml --------- LA volume/bsa, S 28.7 ml/m^2 ---------  Mitral valve Value Reference Mitral E-wave peak velocity 75.7 cm/s --------- Mitral A-wave peak velocity 119 cm/s --------- Mitral deceleration time 162 ms 150 - 230 Mitral peak gradient, D 2 mm Hg --------- Mitral E/A ratio, peak 0.6 ---------  Systemic veins Value Reference Estimated CVP 3 mm Hg ---------  Right ventricle Value Reference RV s&', lateral, S 7.68 cm/s ---------  Legend: (L) and (H) mark values outside specified reference range.  ------------------------------------------------------------------- Prepared and Electronically Authenticated by  Kirk Ruths 2015-08-24T08:46:56   Impression:  Patient is clinically doing well approximately 4 weeks status post biological Bentall aortic root replacement and single-vessel coronary artery bypass grafting. Patient has underlying severe left ventricular dysfunction related to long-standing aortic insufficiency. Followup echocardiogram performed today demonstrates normal function of the bioprosthetic tissue valve in the aortic position. There is no pericardial effusion. Ejection fraction was estimated 20-25%, which is anticipated under the circumstances.  Plan:  We have not made any changes to the patient's current medications, although I suggested that she switched 2 enteric-coated aspirin rather than plain aspirin. I've encouraged patient to go ahead and enroll in the outpatient cardiac rehabilitation program. Have reminded her to avoid any sort of heavy lifting or strenuous use of her on her shoulders for least another 2 months. I think she may resume driving an automobile. It might be reasonable  to attempt addition of an ARB to her medical therapy if her blood pressure will permit in the future.  We will defer this to Dr. Theodosia Blender discretion. Similarly, at some point it might be reasonable to check a followup echocardiogram to see if her ejection fraction is improving. Whether or not a defibrillator should be considered could be discussed at that time.  Patient will return for routine followup in 3 months. All of her questions been addressed.  Valentina Gu. Roxy Manns, MD 11/28/2013 1:08 PM

## 2013-11-28 NOTE — Progress Notes (Signed)
2D Echo completed. 11/28/2013

## 2013-11-28 NOTE — Patient Instructions (Signed)
The patient should continue to avoid any heavy lifting or strenuous use of arms or shoulders for at least a total of three months from the time of surgery.  The patient may return to driving an automobile as long as they are no longer requiring oral narcotic pain relievers during the daytime.  It would be wise to start driving only short distances during the daylight and gradually increase from there as they feel comfortable.  The patient is encouraged to enroll and participate in the outpatient cardiac rehab program beginning as soon as practical.  

## 2013-12-01 ENCOUNTER — Telehealth: Payer: Self-pay | Admitting: Cardiology

## 2013-12-01 NOTE — Telephone Encounter (Signed)
New message ° ° °Patient returning call back to nurse.  °

## 2013-12-01 NOTE — Telephone Encounter (Signed)
Follow up      Returning Danielle's call.  Please call on cell phone--[phone]

## 2013-12-01 NOTE — Telephone Encounter (Signed)
Spoke with pt adn gave results to ECHO

## 2013-12-14 ENCOUNTER — Other Ambulatory Visit: Payer: Self-pay | Admitting: Dermatology

## 2013-12-15 ENCOUNTER — Encounter (HOSPITAL_COMMUNITY)
Admission: RE | Admit: 2013-12-15 | Discharge: 2013-12-15 | Disposition: A | Discharge status: Home / Self Care | Payer: Medicare Other | Source: Ambulatory Visit | Attending: Cardiology | Admitting: Cardiology

## 2013-12-15 DIAGNOSIS — Z954 Presence of other heart-valve replacement: Secondary | ICD-10-CM | POA: Insufficient documentation

## 2013-12-15 DIAGNOSIS — I2510 Atherosclerotic heart disease of native coronary artery without angina pectoris: Secondary | ICD-10-CM | POA: Insufficient documentation

## 2013-12-15 DIAGNOSIS — Z951 Presence of aortocoronary bypass graft: Secondary | ICD-10-CM | POA: Insufficient documentation

## 2013-12-15 NOTE — Progress Notes (Signed)
Cardiac Rehab Medication Review by a Pharmacist  Does the patient  feel that his/her medications are working for him/her?  yes  Has the patient been experiencing any side effects to the medications prescribed?  yes  Does the patient measure his/her own blood pressure or blood glucose at home?  yes   Does the patient have any problems obtaining medications due to transportation or finances?   no  Understanding of regimen: excellent Understanding of indications: excellent Potential of compliance: excellent    Pharmacist comments: [name] [name] is a pleasant 60 yof presenting to cardiac rehab clinic today.  She appears compliant with her medications and aware of the importance of her medications.  She provided her post discharge papers for a list of medications.  We discussed purpose and side effects of medications.  Hassie Bruce, Pharm. D. Clinical Pharmacy Resident Pager: [phone] Ph: [phone] 12/15/2013 8:37 AM

## 2013-12-19 ENCOUNTER — Encounter (HOSPITAL_COMMUNITY)
Admission: RE | Admit: 2013-12-19 | Discharge: 2013-12-19 | Disposition: A | Discharge status: Home / Self Care | Payer: Medicare Other | Source: Ambulatory Visit | Attending: Cardiology | Admitting: Cardiology

## 2013-12-19 ENCOUNTER — Encounter (HOSPITAL_COMMUNITY): Payer: Self-pay

## 2013-12-19 DIAGNOSIS — Z951 Presence of aortocoronary bypass graft: Secondary | ICD-10-CM | POA: Diagnosis not present

## 2013-12-19 DIAGNOSIS — Z954 Presence of other heart-valve replacement: Secondary | ICD-10-CM | POA: Diagnosis not present

## 2013-12-19 DIAGNOSIS — I2510 Atherosclerotic heart disease of native coronary artery without angina pectoris: Secondary | ICD-10-CM | POA: Diagnosis not present

## 2013-12-19 LAB — GLUCOSE, CAPILLARY
Glucose-Capillary: 185 mg/dL — ABNORMAL HIGH (ref 70–99)
Glucose-Capillary: 207 mg/dL — ABNORMAL HIGH (ref 70–99)

## 2013-12-19 NOTE — Progress Notes (Addendum)
Pt started cardiac rehab today.  Pt tolerated light exercise without difficulty.  VSS, telemetry-NSR.  Asymptomatic.  PHQ-0.  Pt exhibits no barriers to rehab participation.  Pt has experienced death of her sister and best friend within the past year.  This has been difficult for her however she exhibits a normal grief pattern.  Otherwise pt has positive coping skills and supportive family.  Pt offered reassurance and emotional support.  Pt rehab goals are to increase strength and stamina and increase her activity level.  Pt oriented to exercise equipment and routine.  Understanding verbalized.

## 2013-12-21 ENCOUNTER — Encounter (HOSPITAL_COMMUNITY)
Admission: RE | Admit: 2013-12-21 | Discharge: 2013-12-21 | Disposition: A | Discharge status: Home / Self Care | Payer: Medicare Other | Source: Ambulatory Visit | Attending: Cardiology | Admitting: Cardiology

## 2013-12-21 DIAGNOSIS — I2510 Atherosclerotic heart disease of native coronary artery without angina pectoris: Secondary | ICD-10-CM | POA: Diagnosis not present

## 2013-12-21 LAB — GLUCOSE, CAPILLARY
Glucose-Capillary: 140 mg/dL — ABNORMAL HIGH (ref 70–99)
Glucose-Capillary: 193 mg/dL — ABNORMAL HIGH (ref 70–99)

## 2013-12-23 ENCOUNTER — Encounter (HOSPITAL_COMMUNITY)
Admission: RE | Admit: 2013-12-23 | Discharge: 2013-12-23 | Disposition: A | Discharge status: Home / Self Care | Payer: Medicare Other | Source: Ambulatory Visit | Attending: Cardiology | Admitting: Cardiology

## 2013-12-23 DIAGNOSIS — I2510 Atherosclerotic heart disease of native coronary artery without angina pectoris: Secondary | ICD-10-CM | POA: Diagnosis not present

## 2013-12-23 LAB — GLUCOSE, CAPILLARY
Glucose-Capillary: 232 mg/dL — ABNORMAL HIGH (ref 70–99)
Glucose-Capillary: 160 mg/dL — ABNORMAL HIGH (ref 70–99)

## 2013-12-26 ENCOUNTER — Encounter (HOSPITAL_COMMUNITY)
Admission: RE | Admit: 2013-12-26 | Discharge: 2013-12-26 | Disposition: A | Discharge status: Home / Self Care | Payer: Medicare Other | Source: Ambulatory Visit | Attending: Cardiology | Admitting: Cardiology

## 2013-12-26 DIAGNOSIS — I2510 Atherosclerotic heart disease of native coronary artery without angina pectoris: Secondary | ICD-10-CM | POA: Diagnosis not present

## 2013-12-28 ENCOUNTER — Encounter (HOSPITAL_COMMUNITY)
Admission: RE | Admit: 2013-12-28 | Discharge: 2013-12-28 | Disposition: A | Discharge status: Home / Self Care | Payer: Medicare Other | Source: Ambulatory Visit | Attending: Cardiology | Admitting: Cardiology

## 2013-12-28 DIAGNOSIS — I2510 Atherosclerotic heart disease of native coronary artery without angina pectoris: Secondary | ICD-10-CM | POA: Diagnosis not present

## 2013-12-30 ENCOUNTER — Encounter (HOSPITAL_COMMUNITY)
Admission: RE | Admit: 2013-12-30 | Discharge: 2013-12-30 | Disposition: A | Discharge status: Home / Self Care | Payer: Medicare Other | Source: Ambulatory Visit | Attending: Cardiology | Admitting: Cardiology

## 2013-12-30 DIAGNOSIS — I2510 Atherosclerotic heart disease of native coronary artery without angina pectoris: Secondary | ICD-10-CM | POA: Diagnosis not present

## 2014-01-02 ENCOUNTER — Encounter (HOSPITAL_COMMUNITY)
Admission: RE | Admit: 2014-01-02 | Discharge: 2014-01-02 | Disposition: A | Discharge status: Home / Self Care | Payer: Medicare Other | Source: Ambulatory Visit | Attending: Cardiology | Admitting: Cardiology

## 2014-01-02 DIAGNOSIS — I2510 Atherosclerotic heart disease of native coronary artery without angina pectoris: Secondary | ICD-10-CM | POA: Diagnosis not present

## 2014-01-02 NOTE — Progress Notes (Signed)
I have reviewed home exercise with Bresha. The patient was advised to walk 2-4 days per week outside of CRP II for 10 minutes, 3 times per day, progressing to 15 minutes, 2 times per day until she can walk 30 minutes continuously.  Pt will also complete one additional day of hand weights outside of CRP II.  Progression of exercise prescription was discussed.  Reviewed THR, pulse, RPE, sign and symptoms and when to call 911 or MD.  Pt voiced understanding.  0815 Archie Endo, MS, ACSM RCEP 01/02/2014 3:03 PM

## 2014-01-04 ENCOUNTER — Encounter (HOSPITAL_COMMUNITY)
Admission: RE | Admit: 2014-01-04 | Discharge: 2014-01-04 | Disposition: A | Discharge status: Home / Self Care | Payer: Medicare Other | Source: Ambulatory Visit | Attending: Cardiology | Admitting: Cardiology

## 2014-01-04 DIAGNOSIS — I2510 Atherosclerotic heart disease of native coronary artery without angina pectoris: Secondary | ICD-10-CM | POA: Diagnosis not present

## 2014-01-06 ENCOUNTER — Encounter (HOSPITAL_COMMUNITY)
Admission: RE | Admit: 2014-01-06 | Discharge: 2014-01-06 | Disposition: A | Discharge status: Home / Self Care | Payer: Medicare Other | Source: Ambulatory Visit | Attending: Cardiology | Admitting: Cardiology

## 2014-01-06 DIAGNOSIS — Z951 Presence of aortocoronary bypass graft: Secondary | ICD-10-CM | POA: Insufficient documentation

## 2014-01-06 DIAGNOSIS — Z954 Presence of other heart-valve replacement: Secondary | ICD-10-CM | POA: Insufficient documentation

## 2014-01-09 ENCOUNTER — Encounter (HOSPITAL_COMMUNITY)
Admission: RE | Admit: 2014-01-09 | Discharge: 2014-01-09 | Disposition: A | Discharge status: Home / Self Care | Payer: Medicare Other | Source: Ambulatory Visit | Attending: Cardiology | Admitting: Cardiology

## 2014-01-09 DIAGNOSIS — Z954 Presence of other heart-valve replacement: Secondary | ICD-10-CM | POA: Diagnosis not present

## 2014-01-09 NOTE — Progress Notes (Signed)
[name] [name] 68 y.o. female Nutrition Note Spoke with pt.  Nutrition Plan and Nutrition Survey goals reviewed with pt. Pt is following Step 2 of the Therapeutic Lifestyle Changes diet. Pt wants to lose wt. Pt wt is down 12 lb over the past 8 months. Pt has been trying to lose wt by "walking more." Wt loss tips reviewed.  Pt is diabetic. Last A1c indicates blood glucose well-controlled. Per pt, A1c re-checked 11/23/13 and was 5.8. Pt checks her CBG's once every 2-3 days. Pre-exercise CBG's taken at home MWF since starting rehab and have reportedly been "aroun 128 mg/dL after eating." This Probation officer went over Diabetes Education test results. Pt expressed understanding of the information reviewed. Pt aware of nutrition education classes offered. Vitals - 1 value per visit 12/15/2013 11/28/2013 11/22/2013 10/31/2013  Weight (lb) 164.24 164 164.8 172.1   Vitals - 1 value per visit 10/27/2013 10/27/2013 10/24/2013 10/24/2013  Weight (lb)   170.1 170   Vitals - 1 value per visit 10/19/2013 10/19/2013 10/10/2013 10/05/2013 10/04/2013  Weight (lb)  170 170 170 170   Vitals - 1 value per visit 04/13/2013  Weight (lb) 176.4   Nutrition Diagnosis   Food-and nutrition-related knowledge deficit related to lack of exposure to information as related to diagnosis of: ? CVD ? DM (A1c 6.8)   Obesity related to excessive energy intake as evidenced by a BMI of 30.5  Nutrition RX/ Estimated Daily Nutrition Needs for: wt loss  1200-1450 Kcal, 30-40 gm fat, 9-11 gm sat fat, 1.2-1.5 gm trans-fat, <1500 mg sodium, 150 gm CHO   Nutrition Intervention   Pt's individual nutrition plan reviewed with pt.   Benefits of adopting Therapeutic Lifestyle Changes discussed when Medficts reviewed.   Pt to attend the Portion Distortion class - met; 01/04/14   Pt to attend the  ? Nutrition I class                     ? Nutrition II class        ? Diabetes Blitz class       ? Diabetes Q & A class   Pt given handouts for: ? Nutrition I class ?  Nutrition II class ? Diabetes Blitz class   Continue client-centered nutrition education by RD, as part of interdisciplinary care. Goal(s)   Pt to identify food quantities necessary to achieve: ? wt loss to a goal wt of 140-158 lb (63.6-71.8 kg) at graduation from cardiac rehab.    CBG concentrations in the normal range or as close to normal as is safely possible. Monitor and Evaluate progress toward nutrition goal with team. Nutrition Risk: Change to Moderate [name], M.Ed, RD, LDN, CDE 01/09/2014 9:14 AM

## 2014-01-11 ENCOUNTER — Encounter (HOSPITAL_COMMUNITY)
Admission: RE | Admit: 2014-01-11 | Discharge: 2014-01-11 | Disposition: A | Discharge status: Home / Self Care | Payer: Medicare Other | Source: Ambulatory Visit | Attending: Cardiology | Admitting: Cardiology

## 2014-01-11 DIAGNOSIS — Z954 Presence of other heart-valve replacement: Secondary | ICD-10-CM | POA: Diagnosis not present

## 2014-01-13 ENCOUNTER — Encounter (HOSPITAL_COMMUNITY)
Admission: RE | Admit: 2014-01-13 | Discharge: 2014-01-13 | Disposition: A | Discharge status: Home / Self Care | Payer: Medicare Other | Source: Ambulatory Visit | Attending: Cardiology | Admitting: Cardiology

## 2014-01-13 DIAGNOSIS — Z954 Presence of other heart-valve replacement: Secondary | ICD-10-CM | POA: Diagnosis not present

## 2014-01-16 ENCOUNTER — Encounter (HOSPITAL_COMMUNITY)
Admission: RE | Admit: 2014-01-16 | Discharge: 2014-01-16 | Disposition: A | Discharge status: Home / Self Care | Payer: Medicare Other | Source: Ambulatory Visit | Attending: Cardiology | Admitting: Cardiology

## 2014-01-16 DIAGNOSIS — Z954 Presence of other heart-valve replacement: Secondary | ICD-10-CM | POA: Diagnosis not present

## 2014-01-18 ENCOUNTER — Encounter (HOSPITAL_COMMUNITY)
Admission: RE | Admit: 2014-01-18 | Discharge: 2014-01-18 | Disposition: A | Discharge status: Home / Self Care | Payer: Medicare Other | Source: Ambulatory Visit | Attending: Cardiology | Admitting: Cardiology

## 2014-01-18 DIAGNOSIS — Z954 Presence of other heart-valve replacement: Secondary | ICD-10-CM | POA: Diagnosis not present

## 2014-01-20 ENCOUNTER — Encounter (HOSPITAL_COMMUNITY)
Admission: RE | Admit: 2014-01-20 | Discharge: 2014-01-20 | Disposition: A | Discharge status: Home / Self Care | Payer: Medicare Other | Source: Ambulatory Visit | Attending: Cardiology | Admitting: Cardiology

## 2014-01-20 DIAGNOSIS — Z954 Presence of other heart-valve replacement: Secondary | ICD-10-CM | POA: Diagnosis not present

## 2014-01-23 ENCOUNTER — Encounter (HOSPITAL_COMMUNITY)
Admission: RE | Admit: 2014-01-23 | Discharge: 2014-01-23 | Disposition: A | Discharge status: Home / Self Care | Payer: Medicare Other | Source: Ambulatory Visit | Attending: Cardiology | Admitting: Cardiology

## 2014-01-23 DIAGNOSIS — Z954 Presence of other heart-valve replacement: Secondary | ICD-10-CM | POA: Diagnosis not present

## 2014-01-25 ENCOUNTER — Encounter (HOSPITAL_COMMUNITY): Payer: Medicare Other

## 2014-01-27 ENCOUNTER — Encounter (HOSPITAL_COMMUNITY)
Admission: RE | Admit: 2014-01-27 | Discharge: 2014-01-27 | Disposition: A | Discharge status: Home / Self Care | Payer: Medicare Other | Source: Ambulatory Visit | Attending: Cardiology | Admitting: Cardiology

## 2014-01-27 DIAGNOSIS — Z954 Presence of other heart-valve replacement: Secondary | ICD-10-CM | POA: Diagnosis not present

## 2014-01-30 ENCOUNTER — Encounter (HOSPITAL_COMMUNITY)
Admission: RE | Admit: 2014-01-30 | Discharge: 2014-01-30 | Disposition: A | Discharge status: Home / Self Care | Payer: Medicare Other | Source: Ambulatory Visit | Attending: Cardiology | Admitting: Cardiology

## 2014-01-30 DIAGNOSIS — Z954 Presence of other heart-valve replacement: Secondary | ICD-10-CM | POA: Diagnosis not present

## 2014-02-01 ENCOUNTER — Encounter (HOSPITAL_COMMUNITY)
Admission: RE | Admit: 2014-02-01 | Discharge: 2014-02-01 | Disposition: A | Discharge status: Home / Self Care | Payer: Medicare Other | Source: Ambulatory Visit | Attending: Cardiology | Admitting: Cardiology

## 2014-02-01 DIAGNOSIS — Z954 Presence of other heart-valve replacement: Secondary | ICD-10-CM | POA: Diagnosis not present

## 2014-02-03 ENCOUNTER — Encounter (HOSPITAL_COMMUNITY)
Admission: RE | Admit: 2014-02-03 | Discharge: 2014-02-03 | Disposition: A | Discharge status: Home / Self Care | Payer: Medicare Other | Source: Ambulatory Visit | Attending: Cardiology | Admitting: Cardiology

## 2014-02-03 DIAGNOSIS — Z954 Presence of other heart-valve replacement: Secondary | ICD-10-CM | POA: Diagnosis not present

## 2014-02-06 ENCOUNTER — Encounter (HOSPITAL_COMMUNITY): Payer: Medicare Other

## 2014-02-08 ENCOUNTER — Encounter (HOSPITAL_COMMUNITY): Payer: Medicare Other

## 2014-02-08 DIAGNOSIS — Z951 Presence of aortocoronary bypass graft: Secondary | ICD-10-CM | POA: Diagnosis present

## 2014-02-08 DIAGNOSIS — Z954 Presence of other heart-valve replacement: Secondary | ICD-10-CM | POA: Diagnosis not present

## 2014-02-10 ENCOUNTER — Encounter (HOSPITAL_COMMUNITY)
Admission: RE | Admit: 2014-02-10 | Discharge: 2014-02-10 | Disposition: A | Discharge status: Home / Self Care | Payer: Medicare Other | Source: Ambulatory Visit | Attending: Cardiology | Admitting: Cardiology

## 2014-02-10 DIAGNOSIS — Z954 Presence of other heart-valve replacement: Secondary | ICD-10-CM | POA: Diagnosis not present

## 2014-02-13 ENCOUNTER — Encounter (HOSPITAL_COMMUNITY)
Admission: RE | Admit: 2014-02-13 | Discharge: 2014-02-13 | Disposition: A | Discharge status: Home / Self Care | Payer: Medicare Other | Source: Ambulatory Visit | Attending: Cardiology | Admitting: Cardiology

## 2014-02-13 DIAGNOSIS — Z954 Presence of other heart-valve replacement: Secondary | ICD-10-CM | POA: Diagnosis not present

## 2014-02-15 ENCOUNTER — Encounter (HOSPITAL_COMMUNITY)
Admission: RE | Admit: 2014-02-15 | Discharge: 2014-02-15 | Disposition: A | Discharge status: Home / Self Care | Payer: Medicare Other | Source: Ambulatory Visit | Attending: Cardiology | Admitting: Cardiology

## 2014-02-15 DIAGNOSIS — Z954 Presence of other heart-valve replacement: Secondary | ICD-10-CM | POA: Diagnosis not present

## 2014-02-17 ENCOUNTER — Encounter (HOSPITAL_COMMUNITY): Payer: Medicare Other

## 2014-02-20 ENCOUNTER — Encounter (HOSPITAL_COMMUNITY)
Admission: RE | Admit: 2014-02-20 | Discharge: 2014-02-20 | Disposition: A | Discharge status: Home / Self Care | Payer: Medicare Other | Source: Ambulatory Visit | Attending: Cardiology | Admitting: Cardiology

## 2014-02-20 DIAGNOSIS — Z954 Presence of other heart-valve replacement: Secondary | ICD-10-CM | POA: Diagnosis not present

## 2014-02-22 ENCOUNTER — Encounter (HOSPITAL_COMMUNITY)
Admission: RE | Admit: 2014-02-22 | Discharge: 2014-02-22 | Disposition: A | Discharge status: Home / Self Care | Payer: Medicare Other | Source: Ambulatory Visit | Attending: Cardiology | Admitting: Cardiology

## 2014-02-22 ENCOUNTER — Ambulatory Visit: Payer: Medicare Other | Admitting: Cardiology

## 2014-02-22 DIAGNOSIS — Z954 Presence of other heart-valve replacement: Secondary | ICD-10-CM | POA: Diagnosis not present

## 2014-02-23 ENCOUNTER — Ambulatory Visit (INDEPENDENT_AMBULATORY_CARE_PROVIDER_SITE_OTHER): Payer: Medicare Other | Admitting: Cardiology

## 2014-02-23 ENCOUNTER — Encounter: Payer: Self-pay | Admitting: Cardiology

## 2014-02-23 VITALS — BP 140/76 | HR 69 | Ht 62.0 in | Wt 169.0 lb

## 2014-02-23 DIAGNOSIS — I2583 Coronary atherosclerosis due to lipid rich plaque: Secondary | ICD-10-CM

## 2014-02-23 DIAGNOSIS — I5042 Chronic combined systolic (congestive) and diastolic (congestive) heart failure: Secondary | ICD-10-CM

## 2014-02-23 DIAGNOSIS — I2510 Atherosclerotic heart disease of native coronary artery without angina pectoris: Secondary | ICD-10-CM

## 2014-02-23 DIAGNOSIS — I77810 Thoracic aortic ectasia: Secondary | ICD-10-CM

## 2014-02-23 DIAGNOSIS — I351 Nonrheumatic aortic (valve) insufficiency: Secondary | ICD-10-CM

## 2014-02-23 DIAGNOSIS — I429 Cardiomyopathy, unspecified: Secondary | ICD-10-CM

## 2014-02-23 DIAGNOSIS — I10 Essential (primary) hypertension: Secondary | ICD-10-CM

## 2014-02-23 MED ORDER — ASPIRIN 81 MG PO TBEC: 325.0000 mg | DELAYED_RELEASE_TABLET | Freq: Every day | ORAL | Status: DC

## 2014-02-23 MED ORDER — LOSARTAN POTASSIUM 25 MG PO TABS: 25.0000 mg | ORAL_TABLET | Freq: Every day | ORAL | Status: DC

## 2014-02-23 NOTE — Progress Notes (Signed)
[address], [place], [place]  [address] Phone: [phone] Fax:  [phone]  Date:  02/23/2014   ID:  Zykiria, Bruening 01-06-1946, MRN [id number]  PCP:  Mathews Argyle, MD  Cardiologist:  Fransico Him, MD    History of Present Illness: [name] [name] is a 68 y.o. female with a history of PVC's, dilated aortic root, moderate AR, HTN who presents today for followup. She had an echo done which showed moderately reduced LVF with mild AS and moderate AR with dilated aortic root. She underwent TEE showing severe LV dysfunction EF 25% with moderate to severe AR and dilated aorta. She was referred to CVTS surgery and underwent cath showing severe mid LAD stenosis but otherwise nonobstructive ASCAD. She subequently underwent Bentall Aortic Root replacment and pericardial tissue AVR with 1 vessel CABG with LIMA to LAD. She is doing well. She denies any chest pain, LE edema, dizziness, palpitations or syncope. She still has some mild SOB but significantly improved from prior to surgery.     Wt Readings from Last 3 Encounters:  02/23/14 169 lb (76.658 kg)  12/15/13 164 lb 3.9 oz (74.5 kg)  11/28/13 164 lb (74.39 kg)     Past Medical History  Diagnosis Date  . Gout   . Diabetes mellitus without complication   . Hypertension   . Hypercholesteremia     LDL goal < 100  . Thyroiditis 10/2009    lab and u/s-thyroid function normalized in 9/11  . Adenomatous colon polyp   . Aortic regurgitation     severe by TEE  . Dilated aortic root   . Gout   . Coronary artery disease 10/05/2013    High grade LAD stenosis  . Cardiomyopathy   . Chronic combined systolic and diastolic CHF, NYHA class 2   . Complication of anesthesia   . Heart murmur   . Shortness of breath     with exertion  . GERD (gastroesophageal reflux disease)     Tums prn  . Arthritis   . S/P Bio-Bentall aortic root replacement with bioprosthetic valve conduit 10/27/2013    23 mm [facility] Ease  bovine pericardial tissue valve and 26 mm Vascutek gelweave Val-Salva aortic graft  . S/P CABG x 1 10/27/2013    LIMA to LAD    Current Outpatient Prescriptions  Medication Sig Dispense Refill  . acetaminophen (TYLENOL) 500 MG tablet Take 1,000 mg by mouth every 6 (six) hours as needed.    Marland Kitchen aspirin EC 325 MG EC tablet Take 1 tablet (325 mg total) by mouth daily.    Marland Kitchen atorvastatin (LIPITOR) 10 MG tablet Take 1 tablet (10 mg total) by mouth daily at 6 PM. 90 tablet 3  . Calcium-Vitamin D (CALTRATE 600 PLUS-VIT D PO) Take 1 tablet by mouth daily.    . carvedilol (COREG) 6.25 MG tablet Take 1 tablet (6.25 mg total) by mouth 2 (two) times daily with a meal. 180 tablet 3  . colchicine (COLCRYS) 0.6 MG tablet Take 0.6 mg by mouth daily.    . famotidine (PEPCID) 10 MG tablet Take 10 mg by mouth daily.    Marland Kitchen FLUZONE HIGH-DOSE 0.5 ML SUSY     . GLUCOSAMINE-CHONDROITIN PO Take 1 tablet by mouth daily. Glucosamine 1200 mg, chondroitin 1500 mg    . metFORMIN (GLUCOPHAGE-XR) 500 MG 24 hr tablet Take 500 mg by mouth daily with breakfast.    . Multiple Vitamin (MULTIVITAMIN WITH MINERALS) TABS tablet Take 1 tablet  by mouth daily. Centrum Silver     No current facility-administered medications for this visit.    Allergies:    Allergies  Allergen Reactions  . Atenolol Shortness Of Breath and Other (See Comments)    Edema and Headache  . Lisinopril Cough  . Penicillins Other (See Comments)    G Benzathine: Local injection rash  . Sulfa Antibiotics Hives and Rash    Social History:  The patient  reports that she has never smoked. She has never used smokeless tobacco. She reports that she does not drink alcohol or use illicit drugs.   Family History:  The patient's family history is not on file.   ROS:  Please see the history of present illness.      All other systems reviewed and negative.   PHYSICAL EXAM: VS:  BP 140/76 mmHg  Pulse 69  Ht 5\' 2"  (1.575 m)  Wt 169 lb (76.658 kg)  BMI 30.90  kg/m2 Well nourished, well developed, in no acute distress HEENT: normal Neck: no JVD Cardiac:  normal S1, S2; RRR; no murmur Lungs:  clear to auscultation bilaterally, no wheezing, rhonchi or rales Abd: soft, nontender, no hepatomegaly Ext: no edema Skin: warm and dry Neuro:  CNs 2-12 intact, no focal abnormalities noted   ASSESSMENT AND PLAN:  1. ASCAD 1 vessel s/p LIMA to LAD.  Continue ASA/statin.  Decrease ASA to 81mg  daily. 2. Moderate to severe AR with dilated aortic root s/p Bentall procedure with pericardial tissue valve 3. Severe LV dysfunction most likely secondary to #2 - continue carvedilol - no ACE I/ARB due to cough. - recheck echo in January to reassess LVF - I am going to try her back on low dose Losartan.  She initially post op was on Lisinopril but had a cough and this was changed to Losartan but her BP was on the low side and she felt bad.  Her BP is now good and since the echo showed EF persistently at 20% I would like her to go back on the Losartan 25mg  daily.  She will check her BP daily for a week and call with results. 4. HTN - well controlled - continue carvedilol 5. Chronic combined systolic/diastolic CHF - appears compensated 6. Dyslipidemia - continue statin.  I will get  FLP and ALT from his PCP   Followup with me in 3 months  Signed, Fransico Him, MD [facility] 02/23/2014 10:26 AM

## 2014-02-23 NOTE — Patient Instructions (Signed)
Your physician has recommended you make the following change in your medication:  1) DECREASE Aspirin to 81mg  daily 2) START Losartan 25mg  daily. An Rx has been sent to your pharmacy  Measure your blood pressure daily for 1 week and call the office with your readings. [phone]  We will request your recent labs from Dr.Stinekings office  Your physician has requested that you have an echocardiogram. Echocardiography is a painless test that uses sound waves to create images of your heart. It provides your doctor with information about the size and shape of your heart and how well your heart's chambers and valves are working. This procedure takes approximately one hour. There are no restrictions for this procedure.( To be scheduled in January 2016)   Your physician wants you to follow-up in: 3 months with Dr.Turner You will receive a reminder letter in the mail two months in advance. If you don't receive a letter, please call our office to schedule the follow-up appointment.

## 2014-02-24 ENCOUNTER — Encounter (HOSPITAL_COMMUNITY)
Admission: RE | Admit: 2014-02-24 | Discharge: 2014-02-24 | Disposition: A | Discharge status: Home / Self Care | Payer: Medicare Other | Source: Ambulatory Visit | Attending: Cardiology | Admitting: Cardiology

## 2014-02-24 DIAGNOSIS — Z954 Presence of other heart-valve replacement: Secondary | ICD-10-CM | POA: Diagnosis not present

## 2014-02-27 ENCOUNTER — Encounter (HOSPITAL_COMMUNITY)
Admission: RE | Admit: 2014-02-27 | Discharge: 2014-02-27 | Disposition: A | Discharge status: Home / Self Care | Payer: Medicare Other | Source: Ambulatory Visit | Attending: Cardiology | Admitting: Cardiology

## 2014-02-27 DIAGNOSIS — Z954 Presence of other heart-valve replacement: Secondary | ICD-10-CM | POA: Diagnosis not present

## 2014-02-28 ENCOUNTER — Telehealth: Payer: Self-pay | Admitting: Cardiology

## 2014-02-28 NOTE — Telephone Encounter (Signed)
New message     Talk to the nurse to give bp update

## 2014-03-01 ENCOUNTER — Encounter (HOSPITAL_COMMUNITY)
Admission: RE | Admit: 2014-03-01 | Discharge: 2014-03-01 | Disposition: A | Discharge status: Home / Self Care | Payer: Medicare Other | Source: Ambulatory Visit | Attending: Cardiology | Admitting: Cardiology

## 2014-03-01 DIAGNOSIS — Z954 Presence of other heart-valve replacement: Secondary | ICD-10-CM | POA: Diagnosis not present

## 2014-03-01 NOTE — Telephone Encounter (Signed)
Patient called to inform of BP reading after starting Losartan 25 mg daily: 11/19: 127/68 11/20: 112/67 11/21: 106/67 11/24: 136/70 11/25: 110/70  To Dr. Radford Pax for review.

## 2014-03-01 NOTE — Telephone Encounter (Signed)
Left message to call back  

## 2014-03-06 ENCOUNTER — Ambulatory Visit (INDEPENDENT_AMBULATORY_CARE_PROVIDER_SITE_OTHER): Payer: Medicare Other | Admitting: Thoracic Surgery (Cardiothoracic Vascular Surgery)

## 2014-03-06 ENCOUNTER — Encounter (HOSPITAL_COMMUNITY)
Admission: RE | Admit: 2014-03-06 | Discharge: 2014-03-06 | Disposition: A | Discharge status: Home / Self Care | Payer: Medicare Other | Source: Ambulatory Visit | Attending: Cardiology | Admitting: Cardiology

## 2014-03-06 ENCOUNTER — Encounter: Payer: Self-pay | Admitting: Thoracic Surgery (Cardiothoracic Vascular Surgery)

## 2014-03-06 VITALS — BP 130/77 | HR 71 | Resp 20 | Ht 62.0 in | Wt 169.0 lb

## 2014-03-06 DIAGNOSIS — Z953 Presence of xenogenic heart valve: Secondary | ICD-10-CM

## 2014-03-06 DIAGNOSIS — Z954 Presence of other heart-valve replacement: Secondary | ICD-10-CM | POA: Diagnosis not present

## 2014-03-06 DIAGNOSIS — Z951 Presence of aortocoronary bypass graft: Secondary | ICD-10-CM

## 2014-03-06 NOTE — Telephone Encounter (Signed)
Instructed patient to continue current medications. Patient agrees with treatment plan. 

## 2014-03-06 NOTE — Progress Notes (Signed)
[address]       [place] [address]             [phone]     CARDIOTHORACIC SURGERY OFFICE NOTE  Referring Provider is Sueanne Margarita, MD PCP is Mathews Argyle, MD   HPI:  Patient returns for followup status post biological Bentall aortic root replacement using a bovine bioprosthetic tissue valve and synthetic aortic graft conduit with coronary artery bypass grafting x1 on 10/27/2013 for underlying likely rheumatic heart disease with severe symptomatic aortic insufficiency and moderate-severe global left ventricular systolic dysfunction.  She was last seen here in the office 11/28/2013. Since then she has done well, and she was seen in follow-up by Dr. Radford Pax 02/23/2014.  At that time she was restarted on low-dose losartan. The patient reports that she is doing quite well. She is back to normal physical activity without any limitations. She has been actively participating in cardiac rehabilitation program and making good progress. She reports no significant problems or complaints. Her blood pressure has remained stable since she restarted losartan a few weeks ago.   Current Outpatient Prescriptions  Medication Sig Dispense Refill  . acetaminophen (TYLENOL) 500 MG tablet Take 1,000 mg by mouth every 6 (six) hours as needed.    Marland Kitchen aspirin 81 MG EC tablet Take 4 tablets (325 mg total) by mouth daily.    Marland Kitchen atorvastatin (LIPITOR) 10 MG tablet Take 1 tablet (10 mg total) by mouth daily at 6 PM. 90 tablet 3  . Calcium-Vitamin D (CALTRATE 600 PLUS-VIT D PO) Take 1 tablet by mouth daily.    . carvedilol (COREG) 6.25 MG tablet Take 1 tablet (6.25 mg total) by mouth 2 (two) times daily with a meal. 180 tablet 3  . colchicine (COLCRYS) 0.6 MG tablet Take 0.6 mg by mouth daily.    . famotidine (PEPCID) 10 MG tablet Take 10 mg by mouth daily.    Marland Kitchen FLUZONE HIGH-DOSE 0.5 ML SUSY     . GLUCOSAMINE-CHONDROITIN PO Take 1 tablet by mouth daily. Glucosamine 1200 mg,  chondroitin 1500 mg    . losartan (COZAAR) 25 MG tablet Take 1 tablet (25 mg total) by mouth daily. 30 tablet 11  . metFORMIN (GLUCOPHAGE-XR) 500 MG 24 hr tablet Take 500 mg by mouth daily with breakfast.    . Multiple Vitamin (MULTIVITAMIN WITH MINERALS) TABS tablet Take 1 tablet by mouth daily. Centrum Silver     No current facility-administered medications for this visit.      Physical Exam:   BP 130/77 mmHg  Pulse 71  Resp 20  Ht 5\' 2"  (1.575 m)  Wt 169 lb (76.658 kg)  BMI 30.90 kg/m2  SpO2 98%  General:  Well-appearing  Chest:   Clear  CV:   Regular rate and rhythm with soft systolic murmur  Incisions:  Completely healed  Abdomen:  Soft and nontender  Extremities:  Warm and well-perfused  Diagnostic Tests:  Transthoracic Echocardiography  Patient:  [name] [name], [name] [name] MR #:    [id number] Study Date: 11/28/2013 Gender:   F Age:    68 Height:   154.9 cm Weight:   74.4 kg BSA:    1.82 m^2 Pt. Status: Room:  ORDERING   Fransico Him, MD REFERRING  Fransico Him, MD ATTENDING  Mertie Moores, M.D. SONOGRAPHER Wyatt Mage, RDCS PERFORMING  [facility], Outpatient  cc:  ------------------------------------------------------------------- LV EF: 20% -  25%  ------------------------------------------------------------------- Indications:   Aortic stenosis /insufficiency 424.1.  ------------------------------------------------------------------- History:  PMH:  Dyspnea and murmur. Coronary artery disease. Congestive heart failure. Myocarditis. The causative organism is unknown. Risk factors: Gout. Thyroiditis. Dilated aortic root. GERD. Hypertension. Diabetes mellitus. Dyslipidemia.  ------------------------------------------------------------------- Study Conclusions  - Left ventricle: The cavity size was normal. Wall thickness was normal. Systolic function was severely reduced. The estimated ejection fraction was in the  range of 20% to 25%. Diffuse hypokinesis. Doppler parameters are consistent with abnormal left ventricular relaxation (grade 1 diastolic dysfunction). Doppler parameters are consistent with high ventricular filling pressure. - Aortic valve: A bioprosthesis was present. - Mitral valve: Calcified annulus. There was mild regurgitation. - Right ventricle: Systolic function was mildly reduced. - Atrial septum: There was an atrial septal aneurysm.  Impressions:  - Severe global reduction in LV function; grade 1 diastolic dysfunction; s/p AVR with normal gradients (peak velocity of 2.2 m/s and mean gradient of 9 mmHg); mild MR.  ------------------------------------------------------------------- Labs, prior tests, procedures, and surgery: Transthoracic echocardiography (09/28/2013).   EF was 40%.  Coronary artery bypass grafting (10/27/2013). Valve surgery (10/27/2013).   Aortic valve replacement with a bioprosthetic valve. Aortic repair (10/27/2013).  Aortic root replacement. Transthoracic echocardiography. M-mode, complete 2D, spectral Doppler, and color Doppler. Birthdate: Patient birthdate: 11/05/1945. Age: Patient is 68 yr old. Sex: Gender: female. BMI: 31 kg/m^2. Blood pressure:   124/73 Patient status: Outpatient. Study date: Study date: 11/28/2013. Study time: 07:20 AM. Location: [facility]  -------------------------------------------------------------------  ------------------------------------------------------------------- Left ventricle: The cavity size was normal. Wall thickness was normal. Systolic function was severely reduced. The estimated ejection fraction was in the range of 20% to 25%. Diffuse hypokinesis. Doppler parameters are consistent with abnormal left ventricular relaxation (grade 1 diastolic dysfunction). Doppler parameters are consistent with high ventricular filling  pressure.  ------------------------------------------------------------------- Aortic valve:  Mildly thickened leaflets. A bioprosthesis was present. Mobility was not restricted. Doppler: Transvalvular velocity was within the normal range. There was no stenosis. There was no regurgitation.  VTI ratio of LVOT to aortic valve: 0.44. Peak velocity ratio of LVOT to aortic valve: 0.47. Mean velocity ratio of LVOT to aortic valve: 0.46.  Mean gradient (S): 11 mm Hg. Peak gradient (S): 19 mm Hg.  ------------------------------------------------------------------- Aorta: Aortic root: The aortic root was normal in size.  ------------------------------------------------------------------- Mitral valve:  Calcified annulus. Mobility was not restricted. Doppler: Transvalvular velocity was within the normal range. There was no evidence for stenosis. There was mild regurgitation.  Peak gradient (D): 2 mm Hg.  ------------------------------------------------------------------- Left atrium: The atrium was normal in size.  ------------------------------------------------------------------- Atrial septum: There was an atrial septal aneurysm.  ------------------------------------------------------------------- Right ventricle: The cavity size was normal. Systolic function was mildly reduced.  ------------------------------------------------------------------- Pulmonic valve:  Doppler: Transvalvular velocity was within the normal range. There was no evidence for stenosis.  ------------------------------------------------------------------- Tricuspid valve:  Structurally normal valve.  Doppler: Transvalvular velocity was within the normal range. There was trivial regurgitation.  ------------------------------------------------------------------- Pulmonary artery:  Systolic pressure was within the normal  range.  ------------------------------------------------------------------- Right atrium: The atrium was normal in size.  ------------------------------------------------------------------- Pericardium: There was no pericardial effusion.  ------------------------------------------------------------------- Systemic veins: Inferior vena cava: The vessel was normal in size.  ------------------------------------------------------------------- Measurements  Left ventricle             Value    Reference LV ID, ED, PLAX chordal        49  mm   43 - 52 LV ID, ES, PLAX chordal    (H)   42  mm   23 - 38 LV  fx shortening, PLAX chordal (L)   14  %   >=29 LV PW thickness, ED          11  mm   --------- IVS/LV PW ratio, ED          1      <=1.3 LV e&', lateral             7.81 cm/s  --------- LV E/e&', lateral            9.69     --------- LV e&', medial             4.68 cm/s  --------- LV E/e&', medial            16.18    --------- LV e&', average             6.25 cm/s  --------- LV E/e&', average            12.12    ---------  Ventricular septum           Value    Reference IVS thickness, ED           11  mm   ---------  LVOT                  Value    Reference LVOT peak velocity, S         101  cm/s  --------- LVOT mean velocity, S         71.1 cm/s  --------- LVOT VTI, S              20.7 cm   ---------  Aortic valve              Value    Reference Aortic valve peak velocity, S     216  cm/s  --------- Aortic valve mean velocity, S     155  cm/s  --------- Aortic valve VTI, S          47  cm   --------- Aortic mean gradient, S        11  mm Hg  --------- Aortic peak gradient, S        19  mm Hg --------- VTI ratio, LVOT/AV           0.44     --------- Velocity ratio, peak, LVOT/AV     0.47     --------- Velocity ratio, mean, LVOT/AV     0.46     ---------  Aorta                 Value    Reference Aortic root ID, ED           33.9 mm   ---------  Left atrium              Value    Reference LA ID, A-P, ES             34  mm   --------- LA ID/bsa, A-P             1.87 cm/m^2 <=2.2 LA volume, S              52.2 ml   --------- LA volume/bsa, S            28.7 ml/m^2 ---------  Mitral valve              Value    Reference Mitral E-wave peak velocity  75.7 cm/s  --------- Mitral A-wave peak velocity      119  cm/s  --------- Mitral deceleration time        162  ms   150 - 230 Mitral peak gradient, D        2   mm Hg --------- Mitral E/A ratio, peak         0.6     ---------  Systemic veins             Value    Reference Estimated CVP             3   mm Hg ---------  Right ventricle            Value    Reference RV s&', lateral, S           7.68 cm/s  ---------  Legend: (L) and (H) mark values outside specified reference range.  ------------------------------------------------------------------- Prepared and Electronically Authenticated by  Kirk Ruths 2015-08-24T08:46:56    Impression:  Patient is doing very well 5 months status post aortic root replacement using a bioprosthetic tissue valve and synthetic aortic conduit with single vessel CABG.  Follow-up echocardiogram performed in August demonstrates normal functioning bioprosthetic tissue valve in the aortic position. The patient does have severe global left  ventricular systolic dysfunction which was present prior to surgery. At this point she remains clinically stable with very mild symptoms of exertional shortness of breath which develop only with strenuous physical activity.   Plan:  The patient has been encouraged to continue to increase her physical activity without any limitations at this time. She has been reminded regarding the lifelong need for antibiotic prophylaxis for all dental cleaning and related procedures. She will continue to follow-up with Dr. Radford Pax for management of her medical issues and she will return to see [place] for routine follow-up next July approximately 1 year following her original surgery.  I spent in excess of 15 minutes during the conduct of this office consultation and >50% of this time involved direct face-to-face encounter with the patient for counseling and/or coordination of their care.  Valentina Gu. Roxy Manns, MD 03/06/2014 12:37 PM

## 2014-03-06 NOTE — Patient Instructions (Addendum)
  Endocarditis is a potentially serious infection of heart valves or inside lining of the heart.  It occurs more commonly in patients with diseased heart valves (such as patient's with aortic or mitral valve disease) and in patients who have undergone heart valve repair or replacement.  Certain surgical and dental procedures may put you at risk, such as dental cleaning, other dental procedures, or any surgery involving the respiratory, urinary, gastrointestinal tract, gallbladder or prostate gland.   To minimize your chances for develooping endocarditis, maintain good oral health and seek prompt medical attention for any infections involving the mouth, teeth, gums, skin or urinary tract.  Always notify your doctor or dentist about your underlying heart valve condition before having any invasive procedures. You will need to take antibiotics before certain procedures.    The patient may return to unrestricted physical activity.

## 2014-03-06 NOTE — Telephone Encounter (Signed)
-  BP well controlled -continue current meds 

## 2014-03-08 ENCOUNTER — Encounter (HOSPITAL_COMMUNITY)
Admission: RE | Admit: 2014-03-08 | Discharge: 2014-03-08 | Disposition: A | Discharge status: Home / Self Care | Payer: Medicare Other | Source: Ambulatory Visit | Attending: Cardiology | Admitting: Cardiology

## 2014-03-08 DIAGNOSIS — Z951 Presence of aortocoronary bypass graft: Secondary | ICD-10-CM | POA: Diagnosis present

## 2014-03-08 DIAGNOSIS — Z954 Presence of other heart-valve replacement: Secondary | ICD-10-CM | POA: Insufficient documentation

## 2014-03-10 ENCOUNTER — Encounter (HOSPITAL_COMMUNITY)
Admission: RE | Admit: 2014-03-10 | Discharge: 2014-03-10 | Disposition: A | Discharge status: Home / Self Care | Payer: Medicare Other | Source: Ambulatory Visit | Attending: Cardiology | Admitting: Cardiology

## 2014-03-10 DIAGNOSIS — Z954 Presence of other heart-valve replacement: Secondary | ICD-10-CM | POA: Diagnosis not present

## 2014-03-13 ENCOUNTER — Encounter (HOSPITAL_COMMUNITY)
Admission: RE | Admit: 2014-03-13 | Discharge: 2014-03-13 | Disposition: A | Discharge status: Home / Self Care | Payer: Medicare Other | Source: Ambulatory Visit | Attending: Cardiology | Admitting: Cardiology

## 2014-03-13 DIAGNOSIS — Z954 Presence of other heart-valve replacement: Secondary | ICD-10-CM | POA: Diagnosis not present

## 2014-03-15 ENCOUNTER — Encounter (HOSPITAL_COMMUNITY)
Admission: RE | Admit: 2014-03-15 | Discharge: 2014-03-15 | Disposition: A | Discharge status: Home / Self Care | Payer: Medicare Other | Source: Ambulatory Visit | Attending: Cardiology | Admitting: Cardiology

## 2014-03-15 DIAGNOSIS — Z954 Presence of other heart-valve replacement: Secondary | ICD-10-CM | POA: Diagnosis not present

## 2014-03-16 ENCOUNTER — Encounter (HOSPITAL_COMMUNITY): Payer: Self-pay | Admitting: Cardiovascular Disease

## 2014-03-17 ENCOUNTER — Encounter (HOSPITAL_COMMUNITY): Payer: Medicare Other

## 2014-03-20 ENCOUNTER — Encounter (HOSPITAL_COMMUNITY)
Admission: RE | Admit: 2014-03-20 | Discharge: 2014-03-20 | Disposition: A | Discharge status: Home / Self Care | Payer: Medicare Other | Source: Ambulatory Visit | Attending: Cardiology | Admitting: Cardiology

## 2014-03-20 DIAGNOSIS — Z954 Presence of other heart-valve replacement: Secondary | ICD-10-CM | POA: Diagnosis not present

## 2014-03-21 ENCOUNTER — Other Ambulatory Visit: Payer: Self-pay

## 2014-03-21 DIAGNOSIS — Z1231 Encounter for screening mammogram for malignant neoplasm of breast: Secondary | ICD-10-CM

## 2014-03-22 ENCOUNTER — Encounter (HOSPITAL_COMMUNITY): Payer: Self-pay

## 2014-03-22 ENCOUNTER — Encounter (HOSPITAL_COMMUNITY)
Admission: RE | Admit: 2014-03-22 | Discharge: 2014-03-22 | Disposition: A | Discharge status: Home / Self Care | Payer: Medicare Other | Source: Ambulatory Visit | Attending: Cardiology | Admitting: Cardiology

## 2014-03-22 DIAGNOSIS — Z954 Presence of other heart-valve replacement: Secondary | ICD-10-CM | POA: Diagnosis not present

## 2014-03-22 NOTE — Progress Notes (Signed)
Pt graduated from cardiac rehab program today with completion of 36 exercise sessions in Phase II. Pt maintained good attendance and progressed nicely during her participation in rehab as evidenced by increased MET level.   Medication list reconciled. Repeat  PHQ score-0  .  Pt has made significant lifestyle changes and should be commended for her  success. Pt feels she has achieved her  goals during cardiac rehab which included increasing strength, energy to be able to return to previous activities as prior to her surgery.  Pt is looking forward to traveling more with her husband.    Pt plans to continue exercising on her own at church and local fitness center.

## 2014-03-24 ENCOUNTER — Encounter (HOSPITAL_COMMUNITY): Payer: Medicare Other

## 2014-04-10 ENCOUNTER — Ambulatory Visit (HOSPITAL_COMMUNITY): Payer: Medicare Other | Attending: Cardiology | Admitting: Cardiology

## 2014-04-10 ENCOUNTER — Ambulatory Visit
Admission: RE | Admit: 2014-04-10 | Discharge: 2014-04-10 | Disposition: A | Discharge status: Home / Self Care | Payer: Medicare Other | Source: Ambulatory Visit

## 2014-04-10 DIAGNOSIS — Z953 Presence of xenogenic heart valve: Secondary | ICD-10-CM

## 2014-04-10 DIAGNOSIS — I351 Nonrheumatic aortic (valve) insufficiency: Secondary | ICD-10-CM | POA: Insufficient documentation

## 2014-04-10 DIAGNOSIS — Z954 Presence of other heart-valve replacement: Secondary | ICD-10-CM

## 2014-04-10 DIAGNOSIS — Z1231 Encounter for screening mammogram for malignant neoplasm of breast: Secondary | ICD-10-CM

## 2014-04-10 DIAGNOSIS — Z48812 Encounter for surgical aftercare following surgery on the circulatory system: Secondary | ICD-10-CM | POA: Insufficient documentation

## 2014-04-10 NOTE — Progress Notes (Signed)
Echo performed. 

## 2014-04-14 ENCOUNTER — Ambulatory Visit (INDEPENDENT_AMBULATORY_CARE_PROVIDER_SITE_OTHER): Payer: Medicare Other | Admitting: Cardiology

## 2014-04-14 ENCOUNTER — Encounter: Payer: Self-pay | Admitting: Cardiology

## 2014-04-14 VITALS — BP 120/74 | HR 75 | Ht 61.0 in | Wt 169.4 lb

## 2014-04-14 DIAGNOSIS — I429 Cardiomyopathy, unspecified: Secondary | ICD-10-CM

## 2014-04-14 DIAGNOSIS — I2510 Atherosclerotic heart disease of native coronary artery without angina pectoris: Secondary | ICD-10-CM

## 2014-04-14 DIAGNOSIS — I77810 Thoracic aortic ectasia: Secondary | ICD-10-CM

## 2014-04-14 DIAGNOSIS — I351 Nonrheumatic aortic (valve) insufficiency: Secondary | ICD-10-CM

## 2014-04-14 DIAGNOSIS — I5042 Chronic combined systolic (congestive) and diastolic (congestive) heart failure: Secondary | ICD-10-CM

## 2014-04-14 DIAGNOSIS — I2583 Coronary atherosclerosis due to lipid rich plaque: Secondary | ICD-10-CM

## 2014-04-14 DIAGNOSIS — I10 Essential (primary) hypertension: Secondary | ICD-10-CM

## 2014-04-14 MED ORDER — LOSARTAN POTASSIUM 25 MG PO TABS: 25.0000 mg | ORAL_TABLET | Freq: Every day | ORAL | Status: DC

## 2014-04-14 NOTE — Progress Notes (Signed)
[address] [place], [place]  [address] Phone: [phone] Fax:  [phone]  Date:  04/14/2014   ID:  [name], [name] 1945/06/12, MRN [id number]  PCP:  Mathews Argyle, MD  Cardiologist:  Fransico Him, MD    History of Present Illness: [name] [name] is a 69 y.o. female with a history of PVC's, dilated aortic root, moderate AR, HTN who presents today for followup. She had an echo done which showed moderately reduced LVF with mild AS and moderate AR with dilated aortic root. She underwent TEE showing severe LV dysfunction EF 25% with moderate to severe AR and dilated aorta. She was referred to CVTS surgery and underwent cath showing severe mid LAD stenosis but otherwise nonobstructive ASCAD. She subequently underwent Bentall Aortic Root replacment and pericardial tissue AVR with 1 vessel CABG with LIMA to LAD. She is doing well. She denies any chest pain, SOB, DOE, LE edema, dizziness, palpitations or syncope.    Wt Readings from Last 3 Encounters:  04/14/14 169 lb 6.4 oz (76.839 kg)  03/06/14 169 lb (76.658 kg)  02/23/14 169 lb (76.658 kg)     Past Medical History  Diagnosis Date  . Gout   . Diabetes mellitus without complication   . Hypertension   . Hypercholesteremia     LDL goal < 100  . Thyroiditis 10/2009    lab and u/s-thyroid function normalized in 9/11  . Adenomatous colon polyp   . Aortic regurgitation     severe by TEE  . Dilated aortic root   . Gout   . Coronary artery disease 10/05/2013    High grade LAD stenosis  . Cardiomyopathy   . Chronic combined systolic and diastolic CHF, NYHA class 2   . Complication of anesthesia   . Heart murmur   . Shortness of breath     with exertion  . GERD (gastroesophageal reflux disease)     Tums prn  . Arthritis   . S/P Bio-Bentall aortic root replacement with bioprosthetic valve conduit 10/27/2013    23 mm [facility] Ease bovine pericardial tissue valve and 26 mm Vascutek gelweave Val-Salva  aortic graft  . S/P CABG x 1 10/27/2013    LIMA to LAD    Current Outpatient Prescriptions  Medication Sig Dispense Refill  . acetaminophen (TYLENOL) 500 MG tablet Take 1,000 mg by mouth every 6 (six) hours as needed.    Marland Kitchen aspirin EC 81 MG tablet Take 81 mg by mouth daily.    Marland Kitchen atorvastatin (LIPITOR) 10 MG tablet Take 1 tablet (10 mg total) by mouth daily at 6 PM. 90 tablet 3  . Calcium-Vitamin D (CALTRATE 600 PLUS-VIT D PO) Take 1 tablet by mouth daily.    . carvedilol (COREG) 6.25 MG tablet Take 1 tablet (6.25 mg total) by mouth 2 (two) times daily with a meal. 180 tablet 3  . colchicine (COLCRYS) 0.6 MG tablet Take 0.6 mg by mouth daily.    . famotidine (PEPCID) 10 MG tablet Take 10 mg by mouth as needed.     Marland Kitchen GLUCOSAMINE-CHONDROITIN PO Take 1 tablet by mouth daily. Glucosamine 1200 mg, chondroitin 1500 mg    . losartan (COZAAR) 25 MG tablet Take 1 tablet (25 mg total) by mouth daily. 30 tablet 11  . metFORMIN (GLUCOPHAGE-XR) 500 MG 24 hr tablet Take 500 mg by mouth daily with breakfast.    . Multiple Vitamin (MULTIVITAMIN WITH MINERALS) TABS tablet Take 1 tablet by mouth daily. Centrum Silver  No current facility-administered medications for this visit.    Allergies:    Allergies  Allergen Reactions  . Atenolol Shortness Of Breath and Other (See Comments)    Edema and Headache  . Lisinopril Cough  . Penicillins Other (See Comments)    G Benzathine: Local injection rash  . Sulfa Antibiotics Hives and Rash    Social History:  The patient  reports that she has never smoked. She has never used smokeless tobacco. She reports that she does not drink alcohol or use illicit drugs.   Family History:  The patient's family history is not on file.   ROS:  Please see the history of present illness.      All other systems reviewed and negative.   PHYSICAL EXAM: VS:  BP 120/74 mmHg  Pulse 75  Ht 5\' 1"  (1.549 m)  Wt 169 lb 6.4 oz (76.839 kg)  BMI 32.02 kg/m2  SpO2 98% Well  nourished, well developed, in no acute distress HEENT: normal Neck: no JVD Cardiac:  normal S1, S2; RRR; 2/6 SM at RUSB Lungs:  clear to auscultation bilaterally, no wheezing, rhonchi or rales Abd: soft, nontender, no hepatomegaly Ext: no edema Skin: warm and dry Neuro:  CNs 2-12 intact, no focal abnormalities noted     ASSESSMENT AND PLAN:  1. ASCAD 1 vessel s/p LIMA to LAD. Continue ASA/statin.  2. Moderate to severe AR with dilated aortic root s/p Bentall procedure with pericardial tissue valve 3. Severe LV dysfunction most likely secondary to #2 - now resolved and EF 50-55% - continue carvedilol/ARB 4. HTN - well controlled - continue carvedilol 5. Chronic combined systolic/diastolic CHF - appears compensated 6. Dyslipidemia - continue statin. I will get FLP and ALT from his PCP  Followup with me in 6 months   Signed, Fransico Him, MD [facility] 04/14/2014 11:39 AM

## 2014-04-14 NOTE — Patient Instructions (Signed)
Your physician recommends that you continue on your current medications as directed. Please refer to the Current Medication list given to you today.  Your physician wants you to follow-up in: 6 months with Dr Turner You will receive a reminder letter in the mail two months in advance. If you don't receive a letter, please call our office to schedule the follow-up appointment.  

## 2014-04-14 NOTE — Addendum Note (Signed)
Addended by: Harland German A on: 04/14/2014 11:49 AM   Modules accepted: Orders

## 2014-05-29 ENCOUNTER — Ambulatory Visit: Payer: Medicare Other | Admitting: Cardiology

## 2014-07-26 ENCOUNTER — Encounter: Payer: Self-pay | Admitting: Cardiology

## 2014-10-21 DIAGNOSIS — E785 Hyperlipidemia, unspecified: Secondary | ICD-10-CM | POA: Insufficient documentation

## 2014-10-21 NOTE — Progress Notes (Signed)
This encounter was created in error - please disregard.

## 2014-10-25 ENCOUNTER — Encounter: Payer: Medicare Other | Admitting: Cardiology

## 2014-11-13 ENCOUNTER — Ambulatory Visit: Payer: Medicare Other | Admitting: Thoracic Surgery (Cardiothoracic Vascular Surgery)

## 2014-11-20 ENCOUNTER — Ambulatory Visit: Payer: Medicare Other | Admitting: Thoracic Surgery (Cardiothoracic Vascular Surgery)

## 2014-11-23 ENCOUNTER — Other Ambulatory Visit: Payer: Self-pay | Admitting: Cardiology

## 2014-11-27 ENCOUNTER — Ambulatory Visit (INDEPENDENT_AMBULATORY_CARE_PROVIDER_SITE_OTHER): Payer: Medicare Other | Admitting: Thoracic Surgery (Cardiothoracic Vascular Surgery)

## 2014-11-27 ENCOUNTER — Encounter: Payer: Self-pay | Admitting: Thoracic Surgery (Cardiothoracic Vascular Surgery)

## 2014-11-27 VITALS — BP 137/74 | HR 69 | Resp 20 | Ht 61.0 in | Wt 170.0 lb

## 2014-11-27 DIAGNOSIS — Z951 Presence of aortocoronary bypass graft: Secondary | ICD-10-CM | POA: Diagnosis not present

## 2014-11-27 DIAGNOSIS — Z954 Presence of other heart-valve replacement: Secondary | ICD-10-CM | POA: Diagnosis not present

## 2014-11-27 DIAGNOSIS — Z953 Presence of xenogenic heart valve: Secondary | ICD-10-CM

## 2014-11-27 NOTE — Patient Instructions (Signed)

## 2014-11-27 NOTE — Progress Notes (Signed)
[address]       [place] [address]             [phone]     CARDIOTHORACIC SURGERY OFFICE NOTE  Referring Provider is [name] Margarita, MD PCP is [name] Argyle, MD   HPI:  Patient returns for routine follow-up approximately one year status post biological Bentall aortic root replacement and single-vessel coronary artery bypass grafting using a bioprosthetic tissue valve on 10/27/2013 for rheumatic heart disease with severe aortic insufficiency, moderate-severe LV systolic dysfunction, ascending thoracic aortic aneurysm and single vessel coronary artery disease.  Her postoperative recovery was uncomplicated and she was last seen here in our office on 03/06/2014. Since then she has been followed carefully by [name] [name]. Follow-up echocardiogram performed 04/10/2014 revealed improved left ventricular systolic function with ejection fraction estimated 50-55%.  The bioprosthetic tissue valve in the aortic position was functioning normally with mean transvalvular gradient estimated 22 mmHg.  There was no aortic insufficiency.  The patient returns to our office today for routine follow-up. She reports feeling quite well. She states that she still gets tired with more strenuous types of physical activity, but overall she feels much better than she did prior to surgery.  She walks every day. She does have to take breaks due to fatigue, but her breathing is quite good. She never gets any chest pain or chest tightness. Overall she is quite pleased with her progress.   Current Outpatient Prescriptions  Medication Sig Dispense Refill  . aspirin EC 81 MG tablet Take 81 mg by mouth daily.    Marland Kitchen atorvastatin (LIPITOR) 10 MG tablet TAKE 1 TABLET (10 MG TOTAL) BY MOUTH DAILY AT 6 PM. 90 tablet 3  . Calcium-Vitamin D (CALTRATE 600 PLUS-VIT D PO) Take 1 tablet by mouth daily.    . carvedilol (COREG) 6.25 MG tablet Take 1 tablet (6.25 mg total) by mouth 2 (two) times daily with a  meal. 180 tablet 3  . colchicine (COLCRYS) 0.6 MG tablet Take 0.6 mg by mouth daily.    . famotidine (PEPCID) 10 MG tablet Take 10 mg by mouth as needed.     Marland Kitchen GLUCOSAMINE-CHONDROITIN PO Take 1 tablet by mouth daily. Glucosamine 1200 mg, chondroitin 1500 mg    . losartan (COZAAR) 25 MG tablet Take 1 tablet (25 mg total) by mouth daily. 90 tablet 3  . metFORMIN (GLUCOPHAGE-XR) 500 MG 24 hr tablet Take 500 mg by mouth daily with breakfast.    . Multiple Vitamin (MULTIVITAMIN WITH MINERALS) TABS tablet Take 1 tablet by mouth daily. Centrum Silver    . traMADol-acetaminophen (ULTRACET) 37.5-325 MG per tablet Take 1 tablet by mouth every 6 (six) hours as needed.    Marland Kitchen acetaminophen (TYLENOL) 500 MG tablet Take 1,000 mg by mouth every 6 (six) hours as needed.     No current facility-administered medications for this visit.      Physical Exam:   BP 137/74 mmHg  Pulse 69  Resp 20  Ht 5\' 1"  (1.549 m)  Wt 170 lb (77.111 kg)  BMI 32.14 kg/m2  SpO2 98%  General:  Well-appearing  Chest:   clear  CV:   Regular rate and rhythm with soft systolic murmur heard best at the sternal border  Incisions:  Completely healed, sternum is stable  Abdomen:  Soft and nontender  Extremities:  Warm and well-perfused  Diagnostic Tests:  Transthoracic Echocardiography  Patient:  [name], [name] MR #:    [id number]  Study Date: 04/10/2014 Gender:   F Age:    69 Height:   157.5 cm Weight:   76.7 kg BSA:    1.86 m^2 Pt. Status: Room:  ATTENDING  [name] Him, MD ORDERING   [name] Him, MD REFERRING  [name] Him, MD SONOGRAPHER [name] [name], Will PERFORMING  [facility], Outpatient  cc:  ------------------------------------------------------------------- LV EF: 50% -  55%  ------------------------------------------------------------------- Indications:   (I35.1).  ------------------------------------------------------------------- History:  PMH: Bentall aortic root  replacement and pericardial tissue AVR with 1 vessel CABG. Acquired from the patient and from the patient&'s chart. Risk factors: Hypertension. Diabetes mellitus. Dyslipidemia.  ------------------------------------------------------------------- Study Conclusions  - Left ventricle: E/e&'>14 suggestive of elevated LV filing pressures. The cavity size was mildly dilated. Systolic function was normal. The estimated ejection fraction was in the range of 50% to 55%. Wall motion was normal; there were no regional wall motion abnormalities. There was an increased relative contribution of atrial contraction to ventricular filling. Doppler parameters are consistent with abnormal left ventricular relaxation (grade 1 diastolic dysfunction). - Aortic valve: A bioprosthesis was present. - Mitral valve: Calcified annulus. Mildly thickened leaflets . There was mild regurgitation. - Left atrium: The atrium was mildly dilated. - Pulmonic valve: There was trivial regurgitation.  Transthoracic echocardiography. M-mode, complete 2D, spectral Doppler, and color Doppler. Birthdate: Patient birthdate: September 23, 1945. Age: Patient is 69 yr old. Sex: Gender: female. BMI: 30.9 kg/m^2. Blood pressure:   140/76 Patient status: Outpatient. Study date: Study date: 04/10/2014. Study time: 09:40 AM. Location: [facility]  -------------------------------------------------------------------  ------------------------------------------------------------------- Left ventricle: E/e&'>14 suggestive of elevated LV filing pressures. The cavity size was mildly dilated. Systolic function was normal. The estimated ejection fraction was in the range of 50% to 55%. Wall motion was normal; there were no regional wall motion abnormalities. There was an increased relative contribution of atrial contraction to ventricular filling. Doppler parameters are consistent with abnormal left  ventricular relaxation (grade 1 diastolic dysfunction).  ------------------------------------------------------------------- Aortic valve: A bioprosthesis was present. Mobility was not restricted. Doppler: Transvalvular velocity was within the normal range. There was no stenosis. There was no regurgitation.  VTI ratio of LVOT to aortic valve: 0.29. Valve area (VTI): 0.92 cm^2. Indexed valve area (VTI): 0.49 cm^2/m^2. Valve area (Vmax): 0.93 cm^2. Indexed valve area (Vmax): 0.5 cm^2/m^2. Mean velocity ratio of LVOT to aortic valve: 0.28. Valve area (Vmean): 0.89 cm^2. Indexed valve area (Vmean): 0.48 cm^2/m^2.  Mean gradient (S): 22 mm Hg. Peak gradient (S): 40 mm Hg.  ------------------------------------------------------------------- Aorta: Aortic root: The aortic root was normal in size.  ------------------------------------------------------------------- Mitral valve:  Calcified annulus. Mildly thickened leaflets . Mobility was not restricted. Doppler: Transvalvular velocity was within the normal range. There was no evidence for stenosis. There was mild regurgitation.  Peak gradient (D): 3 mm Hg.  ------------------------------------------------------------------- Left atrium: The atrium was mildly dilated.  ------------------------------------------------------------------- Right ventricle: The cavity size was normal. Wall thickness was normal. Systolic function was normal.  ------------------------------------------------------------------- Pulmonic valve:  Structurally normal valve.  Cusp separation was normal. Doppler: Transvalvular velocity was within the normal range. There was no evidence for stenosis. There was trivial regurgitation.  ------------------------------------------------------------------- Tricuspid valve:  Structurally normal valve.  Doppler: Transvalvular velocity was within the normal range. There was  no regurgitation.  ------------------------------------------------------------------- Pulmonary artery:  The main pulmonary artery was normal-sized. Systolic pressure was within the normal range.  ------------------------------------------------------------------- Right atrium: The atrium was normal in size.  ------------------------------------------------------------------- Pericardium: There was no pericardial effusion.  ------------------------------------------------------------------- Systemic veins: Inferior vena cava: The  vessel was normal in size.  ------------------------------------------------------------------- Measurements  Left ventricle              Value     Reference LV ID, ED, PLAX chordal      (H)   55.5 mm    43 - 52 LV ID, ES, PLAX chordal      (H)   47.6 mm    23 - 38 LV fx shortening, PLAX chordal  (L)   14  %    >=29 LV PW thickness, ED            9.78 mm    --------- IVS/LV PW ratio, ED            0.82      <=1.3 Stroke volume, 2D             66  ml    --------- Stroke volume/bsa, 2D           35  ml/m^2  --------- LV e&', lateral              6.43 cm/s   --------- LV E/e&', lateral             14.12     --------- LV e&', medial               3.41 cm/s   --------- LV E/e&', medial              26.63     --------- LV e&', average              4.92 cm/s   --------- LV E/e&', average             18.46     ---------  Ventricular septum            Value     Reference IVS thickness, ED             8.05 mm    ---------  LVOT                   Value     Reference LVOT ID, S                20  mm    --------- LVOT area                  3.14 cm^2   --------- LVOT ID                  20  mm    --------- LVOT peak velocity, S           94  cm/s   --------- LVOT mean velocity, S           63  cm/s   --------- LVOT VTI, S                21  cm    --------- LVOT peak gradient, S           4   mm Hg  --------- Stroke volume (SV), LVOT DP        66  ml    --------- Stroke index (SV/bsa), LVOT DP      35.5 ml/m^2  ---------  Aortic valve               Value     Reference Aortic valve mean velocity, S       222  cm/s   ---------  Aortic valve VTI, S            71.4 cm    --------- Aortic mean gradient, S          22  mm Hg  --------- Aortic peak gradient, S          40  mm Hg  --------- VTI ratio, LVOT/AV            0.29      --------- Aortic valve area, VTI          0.92 cm^2   --------- Aortic valve area/bsa, VTI        0.49 cm^2/m^2 --------- Aortic valve area, peak velocity     0.93 cm^2   --------- Aortic valve area/bsa, peak        0.5  cm^2/m^2 --------- velocity Velocity ratio, mean, LVOT/AV       0.28      --------- Aortic valve area, mean velocity     0.89 cm^2   --------- Aortic valve area/bsa, mean        0.48 cm^2/m^2 --------- velocity  Aorta                   Value     Reference Aortic root ID, ED            37  mm    --------- Ascending aorta ID, A-P, S        29  mm    ---------  Left atrium                Value     Reference LA ID, A-P, ES              39  mm    --------- LA ID/bsa, A-P              2.1  cm/m^2  <=2.2 LA volume, S               56  ml    --------- LA volume/bsa, S              30.1 ml/m^2  --------- LA volume, ES, 1-p A4C          66  ml    --------- LA volume/bsa, ES, 1-p A4C        35.5 ml/m^2  --------- LA volume, ES, 1-p A2C          45  ml    --------- LA volume/bsa, ES, 1-p A2C        24.2 ml/m^2  ---------  Mitral valve               Value     Reference Mitral E-wave peak velocity        90.8 cm/s   --------- Mitral A-wave peak velocity        117  cm/s   --------- Mitral deceleration time     (H)   317  ms    150 - 230 Mitral peak gradient, D          3   mm Hg  --------- Mitral E/A ratio, peak          0.8      ---------  Pulmonary arteries            Value     Reference PA pressure, S, DP            22  mm Hg  <=30  Tricuspid  valve              Value     Reference Tricuspid regurg peak velocity      220  cm/s   --------- Tricuspid peak RV-RA gradient       19  mm Hg  ---------  Systemic veins              Value     Reference Estimated CVP               3   mm Hg  ---------  Right ventricle              Value     Reference RV pressure, S, DP            22  mm Hg  <=30 RV s&', lateral, S             9.65 cm/s   ---------  Legend: (L) and (H) mark values outside specified reference range.  ------------------------------------------------------------------- Prepared and Electronically Authenticated by  [name] Him, MD 2016-01-04T10:48:57   Impression:  Patient is doing very well approximately one year following biological Bentall aortic root replacement and single-vessel coronary artery bypass grafting. She describes stable symptoms of mild exertional fatigue and shortness of breath consistent with chronic combined  systolic and diastolic congestive heart failure, New York Heart Association functional class II.  Follow-up echocardiogram performed last January demonstrates normal function of the bioprosthetic tissue valve in the aortic position. Left ventricular systolic function appears somewhat improved in comparison with preoperative echocardiogram.   Plan:  The patient will continue to follow-up with [name] [name] and return to see [place] at [place] in the future only as needed.  She has been reminded regarding the lifelong need for antibiotic prophylaxis for all dental cleaning and related procedures.  We have not recommended any changes to the patient's current medications.  The patient is reminded of the numerous potential long-term benefits of regular exercise and adherence to a "heart healthy" diet.  She is also reminded to make every effort to keep her diabetes under very tight control.  They should follow up closely with their primary care physician or endocrinologist and strive to keep their hemoglobin A1c levels as low as possible, preferably near or below 6.0   I spent in excess of 15 minutes during the conduct of this office consultation and >50% of this time involved direct face-to-face encounter with the patient for counseling and/or coordination of their care.   Valentina Gu. Roxy Manns, MD 11/27/2014 5:14 PM

## 2014-11-28 NOTE — Progress Notes (Signed)
Cardiology Office Note   Date:  11/29/2014   ID:  [name], [name] [birth year], MRN [id number]  PCP:  [name] Argyle, MD    Chief Complaint  Patient presents with  . CAD      History of Present Illness: [name] [name] is a 69 y.o. female with a history of PVC's, dilated aortic root, moderate AR, HTN who presents today for followup. She had an echo done which showed moderately reduced LVF with mild AS and moderate AR with dilated aortic root. She underwent TEE showing severe LV dysfunction EF 25% with moderate to severe AR and dilated aorta. She was referred to CVTS surgery and underwent cath showing severe mid LAD stenosis but otherwise nonobstructive ASCAD. She subequently underwent Bentall Aortic Root replacment and pericardial tissue AVR with 1 vessel CABG with LIMA to LAD. She is doing well. She denies any chest pain, SOB, DOE, LE edema, dizziness, palpitations or syncope. Her only complaint is getting tired when she overexerts herself.     Past Medical History  Diagnosis Date  . Gout   . Diabetes mellitus without complication   . Hypertension   . Thyroiditis 10/2009    lab and u/s-thyroid function normalized in 9/11  . Adenomatous colon polyp   . Aortic regurgitation     severe by TEE  . Dilated aortic root   . Gout   . Coronary artery disease 10/05/2013    High grade LAD stenosis  . Cardiomyopathy   . Chronic combined systolic and diastolic CHF, NYHA class 2   . Complication of anesthesia   . Heart murmur   . Shortness of breath     with exertion  . GERD (gastroesophageal reflux disease)     Tums prn  . Arthritis   . S/P Bio-Bentall aortic root replacement with bioprosthetic valve conduit 10/27/2013    23 mm [facility] Ease bovine pericardial tissue valve and 26 mm Vascutek gelweave Val-Salva aortic graft  . S/P CABG x 1 10/27/2013    LIMA to LAD  . Hypercholesteremia     LDL goal < 70    Past Surgical History  Procedure  Laterality Date  . Tubular adenomatous polyp colonoscopy  12/31/2009  . Tee without cardioversion N/A 10/04/2013    Procedure: TRANSESOPHAGEAL ECHOCARDIOGRAM (TEE);  Surgeon: Sueanne Margarita, MD;  Location: [facility] ENDOSCOPY;  Service: Cardiovascular;  Laterality: N/A;  . Abdominal hysterectomy    . Cataract extraction Bilateral   . Carpal tunnel release Right   . Colonoscopy    . Aortic valve replacement N/A 10/27/2013    Procedure: AORTIC VALVE REPLACEMENT (AVR);  Surgeon: Rexene Alberts, MD;  Location: [facility];  Service: Open Heart Surgery;  Laterality: N/A;  . Coronary artery bypass graft N/A 10/27/2013    Procedure: CORONARY ARTERY BYPASS GRAFTING (CABG) x 1 - LIMA to LAD;  Surgeon: Rexene Alberts, MD;  Location: [facility];  Service: Open Heart Surgery;  Laterality: N/A;  . Ascending aortic root replacement N/A 10/27/2013    Procedure: ASCENDING AORTIC ROOT REPLACEMENT;  Surgeon: Rexene Alberts, MD;  Location: [facility];  Service: Open Heart Surgery;  Laterality: N/A;  . Intraoperative transesophageal echocardiogram N/A 10/27/2013    Procedure: INTRAOPERATIVE TRANSESOPHAGEAL ECHOCARDIOGRAM;  Surgeon: Rexene Alberts, MD;  Location: [facility];  Service: Open Heart Surgery;  Laterality: N/A;  . Left and right heart catheterization with coronary angiogram N/A  10/05/2013    Procedure: LEFT AND RIGHT HEART CATHETERIZATION WITH CORONARY ANGIOGRAM;  Surgeon: Blane Ohara, MD;  Location: [facility] CATH LAB;  Service: Cardiovascular;  Laterality: N/A;     Current Outpatient Prescriptions  Medication Sig Dispense Refill  . acetaminophen (TYLENOL) 500 MG tablet Take 1,000 mg by mouth every 6 (six) hours as needed.    Marland Kitchen aspirin EC 81 MG tablet Take 81 mg by mouth daily.    Marland Kitchen atorvastatin (LIPITOR) 10 MG tablet TAKE 1 TABLET (10 MG TOTAL) BY MOUTH DAILY AT 6 PM. 90 tablet 3  . Calcium-Vitamin D (CALTRATE 600 PLUS-VIT D PO) Take 1 tablet by mouth daily.    . carvedilol (COREG) 6.25 MG tablet Take 1 tablet (6.25 mg total) by  mouth 2 (two) times daily with a meal. 180 tablet 3  . colchicine (COLCRYS) 0.6 MG tablet Take 0.6 mg by mouth daily.    . famotidine (PEPCID) 10 MG tablet Take 10 mg by mouth as needed.     Marland Kitchen GLUCOSAMINE-CHONDROITIN PO Take 1 tablet by mouth daily. Glucosamine 1200 mg, chondroitin 1500 mg    . losartan (COZAAR) 25 MG tablet Take 1 tablet (25 mg total) by mouth daily. 90 tablet 3  . metFORMIN (GLUCOPHAGE-XR) 500 MG 24 hr tablet Take 500 mg by mouth daily with breakfast.    . Multiple Vitamin (MULTIVITAMIN WITH MINERALS) TABS tablet Take 1 tablet by mouth daily. Centrum Silver    . traMADol-acetaminophen (ULTRACET) 37.5-325 MG per tablet Take 1 tablet by mouth every 6 (six) hours as needed.     No current facility-administered medications for this visit.    Allergies:   Atenolol; Lisinopril; Penicillins; and Sulfa antibiotics    Social History:  The patient  reports that she has never smoked. She has never used smokeless tobacco. She reports that she does not drink alcohol or use illicit drugs.   Family History:  The patient's family history includes Arthritis/Rheumatoid in her mother; Cancer in her brother; Diabetes in her mother; Heart attack in her father; Hypertension in her mother; Stroke in her sister.    ROS:  Please see the history of present illness.   Otherwise, review of systems are positive for none.   All other systems are reviewed and negative.    PHYSICAL EXAM: VS:  BP 120/78 mmHg  Pulse 71  Ht 5\' 1"  (1.549 m)  Wt 176 lb (79.833 kg)  BMI 33.27 kg/m2 , BMI Body mass index is 33.27 kg/(m^2). GEN: Well nourished, well developed, in no acute distress HEENT: normal Neck: no JVD, carotid bruits, or masses Cardiac: RRR; no  rubs, or gallops,no edema.  2/6 SM at RUSB Respiratory:  clear to auscultation bilaterally, normal work of breathing GI: soft, nontender, nondistended, + BS MS: no deformity or atrophy Skin: warm and dry, no rash Neuro:  Strength and sensation are  intact Psych: euthymic mood, full affect   EKG:  EKG was ordered today and showed NSR at 71bpm with LVh and LAFB, inferolateral T wave abnormality    Recent Labs: No results found for requested labs within last 365 days.    Lipid Panel No results found for: CHOL, TRIG, HDL, CHOLHDL, VLDL, LDLCALC, LDLDIRECT    Wt Readings from Last 3 Encounters:  11/29/14 176 lb (79.833 kg)  11/27/14 170 lb (77.111 kg)  04/14/14 169 lb 6.4 oz (76.839 kg)    ASSESSMENT AND PLAN:  1. ASCAD 1 vessel s/p LIMA to LAD. Continue ASA/statin.  2. Moderate to  severe AR with dilated aortic root s/p Bentall procedure with pericardial tissue valve 3. Severe LV dysfunction most likely secondary to #2 - now resolved and EF 50-55% - continue carvedilol/ARB 4. HTN - well controlled - continue carvedilol/ARB 5. Chronic combined systolic/diastolic CHF - appears compensated 6. Dyslipidemia - continue statin. I will get FLP and ALT from his PCP    Current medicines are reviewed at length with the patient today.  The patient does not have concerns regarding medicines.  The following changes have been made:  no change  Labs/ tests ordered today: See above Assessment and Plan No orders of the defined types were placed in this encounter.     Disposition:   FU with me in 6 months  Signed, Sueanne Margarita, MD  11/29/2014 9:02 AM    [facility] [address], [place], [place]  [address] Phone: [phone]; Fax: [phone]

## 2014-11-29 ENCOUNTER — Encounter: Payer: Self-pay | Admitting: Cardiology

## 2014-11-29 ENCOUNTER — Ambulatory Visit (INDEPENDENT_AMBULATORY_CARE_PROVIDER_SITE_OTHER): Payer: Medicare Other | Admitting: Cardiology

## 2014-11-29 VITALS — BP 120/78 | HR 71 | Ht 61.0 in | Wt 176.0 lb

## 2014-11-29 DIAGNOSIS — I77810 Thoracic aortic ectasia: Secondary | ICD-10-CM | POA: Diagnosis not present

## 2014-11-29 DIAGNOSIS — I351 Nonrheumatic aortic (valve) insufficiency: Secondary | ICD-10-CM

## 2014-11-29 DIAGNOSIS — E785 Hyperlipidemia, unspecified: Secondary | ICD-10-CM

## 2014-11-29 DIAGNOSIS — I2510 Atherosclerotic heart disease of native coronary artery without angina pectoris: Secondary | ICD-10-CM

## 2014-11-29 DIAGNOSIS — Z954 Presence of other heart-valve replacement: Secondary | ICD-10-CM

## 2014-11-29 DIAGNOSIS — Z953 Presence of xenogenic heart valve: Secondary | ICD-10-CM

## 2014-11-29 DIAGNOSIS — I10 Essential (primary) hypertension: Secondary | ICD-10-CM

## 2014-11-29 DIAGNOSIS — I2583 Coronary atherosclerosis due to lipid rich plaque: Principal | ICD-10-CM

## 2014-11-29 DIAGNOSIS — Z951 Presence of aortocoronary bypass graft: Secondary | ICD-10-CM

## 2014-11-29 DIAGNOSIS — I5042 Chronic combined systolic (congestive) and diastolic (congestive) heart failure: Secondary | ICD-10-CM

## 2014-11-29 NOTE — Patient Instructions (Signed)

## 2014-12-07 ENCOUNTER — Other Ambulatory Visit: Payer: Self-pay | Admitting: Cardiology

## 2015-03-08 ENCOUNTER — Encounter: Payer: Self-pay | Admitting: Cardiology

## 2015-03-26 ENCOUNTER — Other Ambulatory Visit: Payer: Self-pay

## 2015-03-26 DIAGNOSIS — Z1231 Encounter for screening mammogram for malignant neoplasm of breast: Secondary | ICD-10-CM

## 2015-05-01 ENCOUNTER — Ambulatory Visit: Payer: Medicare Other

## 2015-05-02 ENCOUNTER — Ambulatory Visit
Admission: RE | Admit: 2015-05-02 | Discharge: 2015-05-02 | Disposition: A | Discharge status: Home / Self Care | Payer: Medicare Other | Source: Ambulatory Visit

## 2015-05-02 DIAGNOSIS — Z1231 Encounter for screening mammogram for malignant neoplasm of breast: Secondary | ICD-10-CM

## 2015-05-04 ENCOUNTER — Encounter (HOSPITAL_COMMUNITY): Payer: Self-pay | Admitting: *Deleted

## 2015-05-04 ENCOUNTER — Other Ambulatory Visit: Payer: Self-pay | Admitting: Gastroenterology

## 2015-05-04 ENCOUNTER — Telehealth: Payer: Self-pay | Admitting: Cardiology

## 2015-05-04 NOTE — Telephone Encounter (Signed)
Confirmed with patient that this is just an FYI and no surgical clearance is requested.  To Dr. Radford Pax.

## 2015-05-04 NOTE — Telephone Encounter (Signed)
New message     FYI Calling to let Dr Radford Pax know that she is having a colonoscopy on 05-08-15 at [facility] long by Dr Earle Gell.

## 2015-05-07 NOTE — Anesthesia Preprocedure Evaluation (Addendum)
Anesthesia Evaluation  Patient identified by MRN, date of birth, ID band Patient awake    Reviewed: Allergy & Precautions, NPO status , Patient's Chart, lab work & pertinent test results  History of Anesthesia Complications (+) PONV and history of anesthetic complications  Airway Mallampati: II  TM Distance: >3 FB Neck ROM: Full    Dental no notable dental hx. (+) Dental Advisory Given   Pulmonary neg pulmonary ROS,    Pulmonary exam normal breath sounds clear to auscultation       Cardiovascular hypertension, + CAD, + Peripheral Vascular Disease and +CHF  Normal cardiovascular exam+ Valvular Problems/Murmurs  Rhythm:Regular Rate:Normal     Neuro/Psych negative neurological ROS  negative psych ROS   GI/Hepatic Neg liver ROS, GERD  ,  Endo/Other  negative endocrine ROSdiabetes  Renal/GU negative Renal ROS  negative genitourinary   Musculoskeletal  (+) Arthritis ,   Abdominal   Peds negative pediatric ROS (+)  Hematology negative hematology ROS (+)   Anesthesia Other Findings   Reproductive/Obstetrics negative OB ROS                            Anesthesia Physical Anesthesia Plan  ASA: III  Anesthesia Plan: MAC   Post-op Pain Management:    Induction: Intravenous  Airway Management Planned:   Additional Equipment:   Intra-op Plan:   Post-operative Plan:   Informed Consent: I have reviewed the patients History and Physical, chart, labs and discussed the procedure including the risks, benefits and alternatives for the proposed anesthesia with the patient or authorized representative who has indicated his/her understanding and acceptance.   Dental advisory given  Plan Discussed with: CRNA  Anesthesia Plan Comments:         Anesthesia Quick Evaluation

## 2015-05-08 ENCOUNTER — Ambulatory Visit (HOSPITAL_COMMUNITY)
Admission: RE | Admit: 2015-05-08 | Discharge: 2015-05-08 | Disposition: A | Discharge status: Home / Self Care | Payer: Medicare Other | Source: Ambulatory Visit | Attending: Gastroenterology | Admitting: Gastroenterology

## 2015-05-08 ENCOUNTER — Ambulatory Visit (HOSPITAL_COMMUNITY): Payer: Medicare Other | Admitting: Anesthesiology

## 2015-05-08 ENCOUNTER — Encounter (HOSPITAL_COMMUNITY): Payer: Self-pay

## 2015-05-08 ENCOUNTER — Encounter (HOSPITAL_COMMUNITY)
Admission: RE | Disposition: A | Discharge status: Home / Self Care | Payer: Self-pay | Source: Ambulatory Visit | Attending: Gastroenterology

## 2015-05-08 DIAGNOSIS — Z90710 Acquired absence of both cervix and uterus: Secondary | ICD-10-CM | POA: Diagnosis not present

## 2015-05-08 DIAGNOSIS — I2510 Atherosclerotic heart disease of native coronary artery without angina pectoris: Secondary | ICD-10-CM | POA: Diagnosis not present

## 2015-05-08 DIAGNOSIS — E1151 Type 2 diabetes mellitus with diabetic peripheral angiopathy without gangrene: Secondary | ICD-10-CM | POA: Diagnosis not present

## 2015-05-08 DIAGNOSIS — K219 Gastro-esophageal reflux disease without esophagitis: Secondary | ICD-10-CM | POA: Insufficient documentation

## 2015-05-08 DIAGNOSIS — I10 Essential (primary) hypertension: Secondary | ICD-10-CM | POA: Insufficient documentation

## 2015-05-08 DIAGNOSIS — M1990 Unspecified osteoarthritis, unspecified site: Secondary | ICD-10-CM | POA: Diagnosis not present

## 2015-05-08 DIAGNOSIS — E7800 Pure hypercholesterolemia, unspecified: Secondary | ICD-10-CM | POA: Insufficient documentation

## 2015-05-08 DIAGNOSIS — M109 Gout, unspecified: Secondary | ICD-10-CM | POA: Insufficient documentation

## 2015-05-08 DIAGNOSIS — Z951 Presence of aortocoronary bypass graft: Secondary | ICD-10-CM | POA: Diagnosis not present

## 2015-05-08 DIAGNOSIS — Z1211 Encounter for screening for malignant neoplasm of colon: Secondary | ICD-10-CM | POA: Insufficient documentation

## 2015-05-08 DIAGNOSIS — Z952 Presence of prosthetic heart valve: Secondary | ICD-10-CM | POA: Insufficient documentation

## 2015-05-08 DIAGNOSIS — Z86010 Personal history of colonic polyps: Secondary | ICD-10-CM | POA: Diagnosis not present

## 2015-05-08 HISTORY — PX: COLONOSCOPY WITH PROPOFOL: SHX5780

## 2015-05-08 LAB — GLUCOSE, CAPILLARY: Glucose-Capillary: 167 mg/dL — ABNORMAL HIGH (ref 65–99)

## 2015-05-08 SURGERY — COLONOSCOPY WITH PROPOFOL
Anesthesia: Monitor Anesthesia Care

## 2015-05-08 MED ORDER — LIDOCAINE HCL (CARDIAC) 20 MG/ML IV SOLN
INTRAVENOUS | Status: DC | PRN
  Administered 2015-05-08: 50 mg via INTRAVENOUS

## 2015-05-08 MED ORDER — SODIUM CHLORIDE 0.9 % IV SOLN: INTRAVENOUS | Status: DC

## 2015-05-08 MED ORDER — ESMOLOL HCL 100 MG/10ML IV SOLN
INTRAVENOUS | Status: DC | PRN
  Administered 2015-05-08: 10 mg via INTRAVENOUS

## 2015-05-08 MED ORDER — LACTATED RINGERS IV SOLN
INTRAVENOUS | Status: DC
  Administered 2015-05-08: 1000 mL via INTRAVENOUS

## 2015-05-08 MED ORDER — PROPOFOL 10 MG/ML IV BOLUS
INTRAVENOUS | Status: DC | PRN
  Administered 2015-05-08 (×4): 20 mg via INTRAVENOUS
  Administered 2015-05-08: 30 mg via INTRAVENOUS

## 2015-05-08 SURGICAL SUPPLY — 21 items

## 2015-05-08 NOTE — Anesthesia Postprocedure Evaluation (Signed)
Anesthesia Post Note  Patient: [name] [name]  Procedure(s) Performed: Procedure(s) (LRB): COLONOSCOPY WITH PROPOFOL (N/A)  Patient location during evaluation: PACU Anesthesia Type: MAC Level of consciousness: awake and alert Pain management: pain level controlled Vital Signs Assessment: post-procedure vital signs reviewed and stable Respiratory status: spontaneous breathing, nonlabored ventilation, respiratory function stable and patient connected to nasal cannula oxygen Cardiovascular status: blood pressure returned to baseline and stable Postop Assessment: no signs of nausea or vomiting Anesthetic complications: no    Last Vitals:  Filed Vitals:   05/08/15 0820 05/08/15 0830  BP: 130/84 149/79  Pulse: 69 87  Temp:    Resp: 15 18    Last Pain: There were no vitals filed for this visit.               Billie Intriago JENNETTE

## 2015-05-08 NOTE — Op Note (Signed)
Procedure: Surveillance colonoscopy. 12/24/2009 colonoscopy was performed with removal of  Two small rectal adenomatous  polyps  Endoscopist: Earle Gell  Premedication: Propofol administered by anesthesia  Procedure: The patient was placed in the left lateral decubitus position. Anal inspection and digital rectal exam were normal. The Pentax pediatric colonoscope was introduced into the rectum and advanced to the cecum. A normal-appearing appendiceal orifice and ileocecal valve were identified. Colonic preparation for the exam today was good. Withdrawal time was 8 minutes  Rectum. Normal. Retroflexed view of the distal rectum was normal  Sigmoid colon and descending colon. Normal  Splenic flexure. Normal  Transverse colon. Normal  Hepatic flexure. Normal  Ascending colon. Normal  Cecum and ileocecal valve. Normal  Assessment: Normal surveillance colonoscopy.

## 2015-05-08 NOTE — H&P (Signed)
  Procedure: Surveillance colonoscopy. History of adenomatous colon polyps removed colonoscopically in the past.  History: The patient is a 70 year old female born 10/29/1945. She is scheduled to undergo a surveillance colonoscopy today.  Past medical history: Gout. Type 2 diabetes mellitus. Hypertension. Hypercholesterolemia. Tubular adenomatous colon polyps removed colonoscopically in September 2011. Moderate aortic valve regurgitation. Mild mitral valve regurgitation. Aortic valve replacement surgery. Coronary artery disease. Descending thoracic aortic aneurysm. Right bunion surgery. Bilateral cataract surgery. Total abdominal hysterectomy. Tonsillectomy. Tubal ligation. Sinus surgery. Right carpal tunnel release surgery. Appendectomy. Left bunion surgery. Aortic valve replacement surgery performed in July 2015. One-vessel coronary artery bypass grafting surgery performed in July 2015.  Medication allergies: Atenolol. Lipitor. Penicillin. Sulfa. Lisinopril.  Exam: The patient is alert and lying comfortably on the endoscopy stretcher. Abdomen is soft and nontender to palpation. Cardiac exam reveals a regular rhythm. Lungs are clear to auscultation.  Plan: Proceed with surveillance colonoscopy

## 2015-05-08 NOTE — Transfer of Care (Signed)
Immediate Anesthesia Transfer of Care Note  Patient: [name] [name]  Procedure(s) Performed: Procedure(s): COLONOSCOPY WITH PROPOFOL (N/A)  Patient Location: PACU  Anesthesia Type:MAC  Level of Consciousness: Patient easily awoken, sedated, comfortable, cooperative, following commands, responds to stimulation.   Airway & Oxygen Therapy: Patient spontaneously breathing, ventilating well, oxygen via simple oxygen mask.  Post-op Assessment: Report given to PACU RN, vital signs reviewed and stable, moving all extremities.   Post vital signs: Reviewed and stable.  Complications: No apparent anesthesia complications

## 2015-05-08 NOTE — Discharge Instructions (Signed)

## 2015-05-09 ENCOUNTER — Encounter (HOSPITAL_COMMUNITY): Payer: Self-pay | Admitting: Gastroenterology

## 2015-06-01 ENCOUNTER — Other Ambulatory Visit: Payer: Self-pay | Admitting: Cardiology

## 2015-07-01 NOTE — Progress Notes (Signed)
Cardiology Office Note   Date:  07/02/2015   ID:  [name] [name] 08-09-1945, MRN [id number]  PCP:  Mathews Argyle, MD    Chief Complaint  Patient presents with  . Coronary Artery Disease  . Hypertension  . Aortic Insuffiency      History of Present Illness: [name] [name] is a 70 y.o. female with a history of PVC's, dilated aortic root, moderate AR, HTN, severe LV dysfunction with EF 25% (now normalized), severe mid LAD stenosis s/p 1 vessel CABG (LIMA to LAD) and Bentall procedure with aortic root replacement with pericardial tissue AVR who presents today for followup.   She is doing well. She denies any chest pain, SOB, DOE, PND, orthopnea, LE edema, dizziness, palpitations or syncope. Her only complaint is getting tired when she overexerts herself. She does have some cramps in her legs but only in bed at night.      Past Medical History  Diagnosis Date  . Gout   . Diabetes mellitus without complication ([place])   . Hypertension   . Thyroiditis 10/2009    lab and u/s-thyroid function normalized in 9/11  . Adenomatous colon polyp   . Aortic regurgitation     severe by TEE  . Dilated aortic root ([place])   . Gout   . Cardiomyopathy ([place])   . Chronic combined systolic and diastolic CHF, NYHA class 2 ([place])   . Heart murmur   . Shortness of breath     with exertion  . GERD (gastroesophageal reflux disease)     Tums prn  . Arthritis   . S/P Bio-Bentall aortic root replacement with bioprosthetic valve conduit 10/27/2013    23 mm [facility] Ease bovine pericardial tissue valve and 26 mm Vascutek gelweave Val-Salva aortic graft  . S/P CABG x 1 10/27/2013    LIMA to LAD  . Hypercholesteremia     LDL goal < 70  . Coronary artery disease 10/05/2013    High grade LAD stenosis. Dr. Ashok Norris sees every 6 months. had valve replaced.    Past Surgical History  Procedure Laterality Date  . Tubular adenomatous polyp colonoscopy  12/31/2009  . Tee  without cardioversion N/A 10/04/2013    Procedure: TRANSESOPHAGEAL ECHOCARDIOGRAM (TEE);  Surgeon: Sueanne Margarita, MD;  Location: [facility] ENDOSCOPY;  Service: Cardiovascular;  Laterality: N/A;  . Abdominal hysterectomy    . Cataract extraction Bilateral   . Carpal tunnel release Right   . Colonoscopy    . Aortic valve replacement N/A 10/27/2013    Procedure: AORTIC VALVE REPLACEMENT (AVR);  Surgeon: Rexene Alberts, MD;  Location: [place];  Service: Open Heart Surgery;  Laterality: N/A;  . Coronary artery bypass graft N/A 10/27/2013    Procedure: CORONARY ARTERY BYPASS GRAFTING (CABG) x 1 - LIMA to LAD;  Surgeon: Rexene Alberts, MD;  Location: [place];  Service: Open Heart Surgery;  Laterality: N/A;  . Ascending aortic root replacement N/A 10/27/2013    Procedure: ASCENDING AORTIC ROOT REPLACEMENT;  Surgeon: Rexene Alberts, MD;  Location: [place];  Service: Open Heart Surgery;  Laterality: N/A;  . Intraoperative transesophageal echocardiogram N/A 10/27/2013    Procedure: INTRAOPERATIVE TRANSESOPHAGEAL ECHOCARDIOGRAM;  Surgeon: Rexene Alberts, MD;  Location: [place];  Service: Open Heart Surgery;  Laterality: N/A;  . Left and right heart catheterization with coronary angiogram N/A 10/05/2013    Procedure: LEFT AND RIGHT  HEART CATHETERIZATION WITH CORONARY ANGIOGRAM;  Surgeon: Blane Ohara, MD;  Location: [facility] CATH LAB;  Service: Cardiovascular;  Laterality: N/A;  . Colonoscopy with propofol N/A 05/08/2015    Procedure: COLONOSCOPY WITH PROPOFOL;  Surgeon: Garlan Fair, MD;  Location: [facility] ENDOSCOPY;  Service: Endoscopy;  Laterality: N/A;     Current Outpatient Prescriptions  Medication Sig Dispense Refill  . acetaminophen (TYLENOL) 500 MG tablet Take 1,000 mg by mouth every 6 (six) hours as needed for mild pain.     Marland Kitchen aspirin EC 81 MG tablet Take 81 mg by mouth daily.    Marland Kitchen atorvastatin (LIPITOR) 10 MG tablet TAKE 1 TABLET (10 MG TOTAL) BY MOUTH DAILY AT 6 PM. 90 tablet 3  . Calcium-Vitamin D (CALTRATE 600  PLUS-VIT D PO) Take 1 tablet by mouth daily.    . carvedilol (COREG) 6.25 MG tablet TAKE 1 TABLET TWICE A DAY WITH A MEAL 180 tablet 2  . chlorpheniramine-HYDROcodone (TUSSIONEX PENNKINETIC ER) 10-8 MG/5ML SUER Take 5 mLs by mouth every 6 (six) hours as needed for cough.    . clindamycin (CLEOCIN) 150 MG capsule Take 4 capsules by mouth as needed. (Take one (1) hour prior to dental procedures)  2  . colchicine (COLCRYS) 0.6 MG tablet Take 0.6 mg by mouth daily.    . famotidine (PEPCID) 10 MG tablet Take 10 mg by mouth daily as needed for heartburn.     Marland Kitchen GLUCOSAMINE-CHONDROITIN PO Take 1 tablet by mouth daily. Glucosamine 1200 mg, chondroitin 1500 mg    . losartan (COZAAR) 25 MG tablet Take 1 tablet (25 mg total) by mouth daily. 90 tablet 1  . metFORMIN (GLUCOPHAGE-XR) 500 MG 24 hr tablet Take 500 mg by mouth daily with breakfast.    . Multiple Vitamin (MULTIVITAMIN WITH MINERALS) TABS tablet Take 1 tablet by mouth daily. Centrum Silver     No current facility-administered medications for this visit.    Allergies:   Atenolol; Lisinopril; Penicillins; and Sulfa antibiotics    Social History:  The patient  reports that she has never smoked. She has never used smokeless tobacco. She reports that she does not drink alcohol or use illicit drugs.   Family History:  The patient's family history includes Arthritis/Rheumatoid in her mother; Cancer in her brother; Diabetes in her mother; Heart attack in her father; Hypertension in her mother; Stroke in her sister.    ROS:  Please see the history of present illness.   Otherwise, review of systems are positive for none.   All other systems are reviewed and negative.    PHYSICAL EXAM: VS:  BP 120/72 mmHg  Pulse 71  Ht 5\' 1"  (1.549 m)  Wt 175 lb 6.4 oz (79.561 kg)  BMI 33.16 kg/m2 , BMI Body mass index is 33.16 kg/(m^2). GEN: Well nourished, well developed, in no acute distress HEENT: normal Neck: no JVD, carotid bruits, or masses Cardiac: RRR;  no rubs, or gallops,no edema.  2/6 SM at RUSB to LLSB Respiratory:  clear to auscultation bilaterally, normal work of breathing GI: soft, nontender, nondistended, + BS MS: no deformity or atrophy Skin: warm and dry, no rash Neuro:  Strength and sensation are intact Psych: euthymic mood, full affect   EKG:  EKG is not ordered today.    Recent Labs: No results found for requested labs within last 365 days.    Lipid Panel No results found for: CHOL, TRIG, HDL, CHOLHDL, VLDL, LDLCALC, LDLDIRECT    Wt Readings from Last 3 Encounters:  07/02/15 175 lb 6.4 oz (79.561 kg)  05/08/15 176 lb (79.833 kg)  11/29/14 176 lb (79.833 kg)     ASSESSMENT AND PLAN:  1. ASCAD 1 vessel s/p LIMA to LAD. Continue ASA/statin/BB.  2. Moderate to severe AR with dilated aortic root s/p Bentall procedure with pericardial tissue valve.  I do hear a heart murmur that is very prominent so I will get a 2D echo to reasess.  3. Severe LV dysfunction most likely secondary to #2 - now resolved and EF 50-55% - continue carvedilol/ARB 4. HTN - well controlled - continue carvedilol/ARB 5. Chronic combined systolic/diastolic CHF - appears compensated 6. Dyslipidemia - continue statin. I will get FLP and ALT from his PCP 7. Leg cramps - I will get LE arterial dopplers to assess for PVD  Current medicines are reviewed at length with the patient today.  The patient does not have concerns regarding medicines.  The following changes have been made:  no change  Labs/ tests ordered today: See above Assessment and Plan No orders of the defined types were placed in this encounter.     Disposition:   FU with me in 1 year  Signed, Sueanne Margarita, MD  07/02/2015 8:23 AM    [facility] [place], [place], [place]  [address] Phone: [phone]; Fax: [phone]

## 2015-07-02 ENCOUNTER — Ambulatory Visit (INDEPENDENT_AMBULATORY_CARE_PROVIDER_SITE_OTHER): Payer: Medicare Other | Admitting: Cardiology

## 2015-07-02 ENCOUNTER — Telehealth: Payer: Self-pay

## 2015-07-02 ENCOUNTER — Encounter: Payer: Self-pay | Admitting: Cardiology

## 2015-07-02 ENCOUNTER — Other Ambulatory Visit: Payer: Self-pay | Admitting: Cardiology

## 2015-07-02 VITALS — BP 120/72 | HR 71 | Ht 61.0 in | Wt 175.4 lb

## 2015-07-02 DIAGNOSIS — I5042 Chronic combined systolic (congestive) and diastolic (congestive) heart failure: Secondary | ICD-10-CM | POA: Diagnosis not present

## 2015-07-02 DIAGNOSIS — R252 Cramp and spasm: Secondary | ICD-10-CM

## 2015-07-02 DIAGNOSIS — I2510 Atherosclerotic heart disease of native coronary artery without angina pectoris: Secondary | ICD-10-CM | POA: Diagnosis not present

## 2015-07-02 DIAGNOSIS — I351 Nonrheumatic aortic (valve) insufficiency: Secondary | ICD-10-CM

## 2015-07-02 DIAGNOSIS — I10 Essential (primary) hypertension: Secondary | ICD-10-CM

## 2015-07-02 DIAGNOSIS — I77810 Thoracic aortic ectasia: Secondary | ICD-10-CM

## 2015-07-02 DIAGNOSIS — I2583 Coronary atherosclerosis due to lipid rich plaque: Principal | ICD-10-CM

## 2015-07-02 DIAGNOSIS — E785 Hyperlipidemia, unspecified: Secondary | ICD-10-CM

## 2015-07-02 NOTE — Telephone Encounter (Signed)
When patient left today, she inquired at check-out if Dr. Radford Pax would sign disability parking placard for her. Per Dr. Radford Pax, informed patient her PCP will have to fill out form. Patient was grateful for call.

## 2015-07-02 NOTE — Patient Instructions (Signed)
Medication Instructions:  Your physician recommends that you continue on your current medications as directed. Please refer to the Current Medication list given to you today.   Labwork: None  Testing/Procedures: Your physician has requested that you have an echocardiogram. Echocardiography is a painless test that uses sound waves to create images of your heart. It provides your doctor with information about the size and shape of your heart and how well your heart's chambers and valves are working. This procedure takes approximately one hour. There are no restrictions for this procedure.   Your physician has requested that you have a lower extremity arterial duplex. During this test, ultrasound is used to evaluate arterial blood flow in the legs. Allow one hour for this exam. There are no restrictions or special instructions.   Follow-Up: Your physician wants you to follow-up in: 1 year with Dr. Radford Pax. You will receive a reminder letter in the mail two months in advance. If you don't receive a letter, please call our office to schedule the follow-up appointment.   Any Other Special Instructions Will Be Listed Below (If Applicable).     If you need a refill on your cardiac medications before your next appointment, please call your pharmacy.

## 2015-07-18 ENCOUNTER — Other Ambulatory Visit: Payer: Self-pay

## 2015-07-18 ENCOUNTER — Other Ambulatory Visit: Payer: Self-pay | Admitting: Cardiology

## 2015-07-18 ENCOUNTER — Ambulatory Visit (HOSPITAL_COMMUNITY)
Admission: RE | Admit: 2015-07-18 | Discharge: 2015-07-18 | Disposition: A | Discharge status: Home / Self Care | Payer: Medicare Other | Source: Ambulatory Visit | Attending: Cardiology | Admitting: Cardiology

## 2015-07-18 ENCOUNTER — Ambulatory Visit (HOSPITAL_BASED_OUTPATIENT_CLINIC_OR_DEPARTMENT_OTHER): Payer: Medicare Other

## 2015-07-18 DIAGNOSIS — I351 Nonrheumatic aortic (valve) insufficiency: Secondary | ICD-10-CM | POA: Insufficient documentation

## 2015-07-18 DIAGNOSIS — Z9889 Other specified postprocedural states: Secondary | ICD-10-CM | POA: Diagnosis not present

## 2015-07-18 DIAGNOSIS — I110 Hypertensive heart disease with heart failure: Secondary | ICD-10-CM | POA: Insufficient documentation

## 2015-07-18 DIAGNOSIS — K219 Gastro-esophageal reflux disease without esophagitis: Secondary | ICD-10-CM | POA: Insufficient documentation

## 2015-07-18 DIAGNOSIS — I059 Rheumatic mitral valve disease, unspecified: Secondary | ICD-10-CM | POA: Diagnosis not present

## 2015-07-18 DIAGNOSIS — Z951 Presence of aortocoronary bypass graft: Secondary | ICD-10-CM | POA: Diagnosis not present

## 2015-07-18 DIAGNOSIS — I10 Essential (primary) hypertension: Secondary | ICD-10-CM | POA: Diagnosis not present

## 2015-07-18 DIAGNOSIS — Z953 Presence of xenogenic heart valve: Secondary | ICD-10-CM | POA: Insufficient documentation

## 2015-07-18 DIAGNOSIS — R252 Cramp and spasm: Secondary | ICD-10-CM | POA: Insufficient documentation

## 2015-07-18 DIAGNOSIS — E785 Hyperlipidemia, unspecified: Secondary | ICD-10-CM | POA: Diagnosis not present

## 2015-07-18 DIAGNOSIS — E119 Type 2 diabetes mellitus without complications: Secondary | ICD-10-CM | POA: Insufficient documentation

## 2015-07-18 DIAGNOSIS — I429 Cardiomyopathy, unspecified: Secondary | ICD-10-CM | POA: Insufficient documentation

## 2015-07-18 DIAGNOSIS — I739 Peripheral vascular disease, unspecified: Secondary | ICD-10-CM

## 2015-07-18 DIAGNOSIS — I5042 Chronic combined systolic (congestive) and diastolic (congestive) heart failure: Secondary | ICD-10-CM | POA: Diagnosis not present

## 2015-07-18 DIAGNOSIS — E7800 Pure hypercholesterolemia, unspecified: Secondary | ICD-10-CM | POA: Diagnosis not present

## 2015-10-02 ENCOUNTER — Other Ambulatory Visit: Payer: Self-pay | Admitting: Cardiology

## 2015-11-06 ENCOUNTER — Other Ambulatory Visit: Payer: Self-pay | Admitting: Cardiology

## 2015-12-10 ENCOUNTER — Other Ambulatory Visit: Payer: Self-pay | Admitting: Cardiology

## 2016-04-15 ENCOUNTER — Other Ambulatory Visit: Payer: Self-pay | Admitting: Geriatric Medicine

## 2016-04-15 DIAGNOSIS — Z1231 Encounter for screening mammogram for malignant neoplasm of breast: Secondary | ICD-10-CM

## 2016-05-21 ENCOUNTER — Ambulatory Visit
Admission: RE | Admit: 2016-05-21 | Discharge: 2016-05-21 | Disposition: A | Discharge status: Home / Self Care | Payer: Medicare Other | Source: Ambulatory Visit | Attending: Geriatric Medicine | Admitting: Geriatric Medicine

## 2016-05-21 DIAGNOSIS — Z1231 Encounter for screening mammogram for malignant neoplasm of breast: Secondary | ICD-10-CM

## 2016-06-30 NOTE — Progress Notes (Signed)
Cardiology Office Note    Date:  07/01/2016   ID:  [name] [name], [name] [name] [birth year], MRN [id number]  PCP:  Mathews Argyle, MD  Cardiologist:  Fransico Him, MD   Chief Complaint  Patient presents with  . Follow-up    PVCs, dilated aortic root, moderate AR, HTN, CAD    History of Present Illness:  [name] [name] is a 71 y.o. female with a history of PVC's, dilated aortic root, severe AR, HTN, severe LV dysfunction with EF 25% (now normalized), severe mid LAD stenosis s/p 1 vessel CABG (LIMA to LAD) and Bentall procedure with aortic root replacement with pericardial tissue AVR who presents today for followup.   She is here for followup today and is doing well.   She has not had any chest pain,  PND, orthopnea, LE edema, palpitations or syncope. She has some mild chronic DOE that is stable. She will get dizzy on occasion when getting up to fast.  She tolerated her medications well.  Her main complaint is that her legs hurt at night.  She has bad DJD of her knees and needs knee replacements.    Past Medical History:  Diagnosis Date  . Adenomatous colon polyp   . Aortic regurgitation    severe by TEE  . Arthritis   . Cardiomyopathy ([place])   . Chronic combined systolic and diastolic CHF, NYHA class 2 ([place])   . Coronary artery disease 10/05/2013   High grade LAD stenosis. Dr. Ashok Norris sees every 6 months. had valve replaced.  . Diabetes mellitus without complication ([place])   . Dilated aortic root ([place])   . GERD (gastroesophageal reflux disease)    Tums prn  . Gout   . Gout   . Heart murmur   . Hypercholesteremia    LDL goal < 70  . Hypertension   . S/P Bio-Bentall aortic root replacement with bioprosthetic valve conduit 10/27/2013   23 mm [facility] Ease bovine pericardial tissue valve and 26 mm Vascutek gelweave Val-Salva aortic graft  . S/P CABG x 1 10/27/2013   LIMA to LAD  . Shortness of breath    with exertion  . Thyroiditis 10/2009   lab and u/s-thyroid function  normalized in 9/11    Past Surgical History:  Procedure Laterality Date  . ABDOMINAL HYSTERECTOMY    . AORTIC VALVE REPLACEMENT N/A 10/27/2013   Procedure: AORTIC VALVE REPLACEMENT (AVR);  Surgeon: Rexene Alberts, MD;  Location: [place];  Service: Open Heart Surgery;  Laterality: N/A;  . ASCENDING AORTIC ROOT REPLACEMENT N/A 10/27/2013   Procedure: ASCENDING AORTIC ROOT REPLACEMENT;  Surgeon: Rexene Alberts, MD;  Location: [place];  Service: Open Heart Surgery;  Laterality: N/A;  . CARPAL TUNNEL RELEASE Right   . CATARACT EXTRACTION Bilateral   . COLONOSCOPY    . COLONOSCOPY WITH PROPOFOL N/A 05/08/2015   Procedure: COLONOSCOPY WITH PROPOFOL;  Surgeon: Garlan Fair, MD;  Location: [facility] ENDOSCOPY;  Service: Endoscopy;  Laterality: N/A;  . CORONARY ARTERY BYPASS GRAFT N/A 10/27/2013   Procedure: CORONARY ARTERY BYPASS GRAFTING (CABG) x 1 - LIMA to LAD;  Surgeon: Rexene Alberts, MD;  Location: [place];  Service: Open Heart Surgery;  Laterality: N/A;  . INTRAOPERATIVE TRANSESOPHAGEAL ECHOCARDIOGRAM N/A 10/27/2013   Procedure: INTRAOPERATIVE TRANSESOPHAGEAL ECHOCARDIOGRAM;  Surgeon: Rexene Alberts, MD;  Location: [place];  Service: Open Heart Surgery;  Laterality: N/A;  . LEFT AND RIGHT HEART CATHETERIZATION WITH CORONARY ANGIOGRAM N/A 10/05/2013   Procedure: LEFT AND  RIGHT HEART CATHETERIZATION WITH CORONARY ANGIOGRAM;  Surgeon: Blane Ohara, MD;  Location: [facility] CATH LAB;  Service: Cardiovascular;  Laterality: N/A;  . TEE WITHOUT CARDIOVERSION N/A 10/04/2013   Procedure: TRANSESOPHAGEAL ECHOCARDIOGRAM (TEE);  Surgeon: Sueanne Margarita, MD;  Location: [facility] ENDOSCOPY;  Service: Cardiovascular;  Laterality: N/A;  . tubular adenomatous polyp colonoscopy  12/31/2009    Current Medications: Current Meds  Medication Sig  . acetaminophen (TYLENOL) 500 MG tablet Take 1,000 mg by mouth every 6 (six) hours as needed for mild pain.   Marland Kitchen aspirin EC 81 MG tablet Take 81 mg by mouth daily.  Marland Kitchen atorvastatin (LIPITOR)  10 MG tablet TAKE 1 TABLET (10 MG TOTAL) BY MOUTH DAILY AT 6 PM.  . Calcium-Vitamin D (CALTRATE 600 PLUS-VIT D PO) Take 1 tablet by mouth daily.  . carvedilol (COREG) 6.25 MG tablet TAKE 1 TABLET TWICE A DAY WITH A MEAL  . clindamycin (CLEOCIN) 150 MG capsule Take 4 capsules by mouth as needed. (Take one (1) hour prior to dental procedures)  . colchicine (COLCRYS) 0.6 MG tablet Take 0.6 mg by mouth daily.  . famotidine (PEPCID) 10 MG tablet Take 10 mg by mouth daily as needed for heartburn.   Marland Kitchen GLUCOSAMINE-CHONDROITIN PO Take 1 tablet by mouth daily. Glucosamine 1200 mg, chondroitin 1500 mg  . losartan (COZAAR) 25 MG tablet TAKE 1 TABLET (25 MG TOTAL) BY MOUTH DAILY.  . metFORMIN (GLUCOPHAGE-XR) 500 MG 24 hr tablet Take 500 mg by mouth daily with breakfast.  . Multiple Vitamin (MULTIVITAMIN WITH MINERALS) TABS tablet Take 1 tablet by mouth daily. Centrum Silver    Allergies:   Atenolol; Lisinopril; Penicillins; and Sulfa antibiotics   Social History   Social History  . Marital status: Married    Spouse name: N/A  . Number of children: N/A  . Years of education: N/A   Social History Main Topics  . Smoking status: Never Smoker  . Smokeless tobacco: Never Used  . Alcohol use No  . Drug use: No  . Sexual activity: Not on file   Other Topics Concern  . Not on file   Social History Narrative  . No narrative on file     Family History:  The patient's family history includes Arthritis/Rheumatoid in her mother; Cancer in her brother; Diabetes in her mother; Heart attack in her father; Hypertension in her mother; Stroke in her sister.   ROS:   Please see the history of present illness.    ROS All other systems reviewed and are negative.  No flowsheet data found.     PHYSICAL EXAM:   VS:  BP 124/76   Pulse 70   Ht 5\' 1"  (1.549 m)   Wt 172 lb 4 oz (78.1 kg)   SpO2 96%   BMI 32.55 kg/m    GEN: Well nourished, well developed, in no acute distress  HEENT: normal  Neck: no  JVD, carotid bruits, or masses Cardiac: RRR; no murmurs, rubs, or gallops,no edema.  Intact distal pulses bilaterally.  Respiratory:  clear to auscultation bilaterally, normal work of breathing GI: soft, nontender, nondistended, + BS MS: no deformity or atrophy  Skin: warm and dry, no rash Neuro:  Alert and Oriented x 3, Strength and sensation are intact Psych: euthymic mood, full affect  Wt Readings from Last 3 Encounters:  07/01/16 172 lb 4 oz (78.1 kg)  07/02/15 175 lb 6.4 oz (79.6 kg)  05/08/15 176 lb (79.8 kg)      Studies/Labs Reviewed:  EKG:  EKG is ordered today and showed NSR with LAD, LVH by voltage with QRS widening and septal infarct  Recent Labs: No results found for requested labs within last 8760 hours.   Lipid Panel No results found for: CHOL, TRIG, HDL, CHOLHDL, VLDL, LDLCALC, LDLDIRECT  Additional studies/ records that were reviewed today include:  none    ASSESSMENT:    1. Coronary artery disease involving native coronary artery of native heart without angina pectoris   2. Aortic valve insufficiency, etiology of cardiac valve disease unspecified   3. Essential hypertension, benign   4. Chronic diastolic CHF (congestive heart failure), NYHA class 2 ([place])   5. Dyslipidemia, goal LDL below 70      PLAN:  In order of problems listed above:  1. ASCAD - with severe mid LAD stenosis s/p 1 vessel CABG with LIMA to LAD at time of AVR.  She has not had any anginal symptoms.  She will continue on ASA/statin and BB.   2. Aortic insufficiency s/p Bentall procedure with pericardial tissue AVR.  She will continue on ASA. 3. HTN - BP controlled on current meds.  She will continue on BB/ARB.  I will check a BMET from PCP. 4. Chronic diastolic CHF NYHA class 2 - she appears euvolemic on exam today.  Her weight is stable.   5. Hyperlipidemia with LDL goal < 70.  She will continue on statin therapy. I will get an FLP and ALT from her PCP.  She is planning on getting  knee replacements in the near future.  She is low risk from a cardiac standpoint for surgery.    Medication Adjustments/Labs and Tests Ordered: Current medicines are reviewed at length with the patient today.  Concerns regarding medicines are outlined above.  Medication changes, Labs and Tests ordered today are listed in the Patient Instructions below.  There are no Patient Instructions on file for this visit.   Signed, Fransico Him, MD  07/01/2016 8:45 AM    [facility] [place], [place], [place]  [address] Phone: [phone]; Fax: [phone]

## 2016-07-01 ENCOUNTER — Encounter: Payer: Self-pay | Admitting: Cardiology

## 2016-07-01 ENCOUNTER — Ambulatory Visit (INDEPENDENT_AMBULATORY_CARE_PROVIDER_SITE_OTHER): Payer: Medicare Other | Admitting: Cardiology

## 2016-07-01 ENCOUNTER — Encounter (INDEPENDENT_AMBULATORY_CARE_PROVIDER_SITE_OTHER): Payer: Self-pay

## 2016-07-01 VITALS — BP 124/76 | HR 70 | Ht 61.0 in | Wt 172.2 lb

## 2016-07-01 DIAGNOSIS — I10 Essential (primary) hypertension: Secondary | ICD-10-CM

## 2016-07-01 DIAGNOSIS — I2510 Atherosclerotic heart disease of native coronary artery without angina pectoris: Secondary | ICD-10-CM | POA: Diagnosis not present

## 2016-07-01 DIAGNOSIS — E785 Hyperlipidemia, unspecified: Secondary | ICD-10-CM | POA: Diagnosis not present

## 2016-07-01 DIAGNOSIS — I351 Nonrheumatic aortic (valve) insufficiency: Secondary | ICD-10-CM | POA: Diagnosis not present

## 2016-07-01 DIAGNOSIS — I5032 Chronic diastolic (congestive) heart failure: Secondary | ICD-10-CM

## 2016-07-01 NOTE — Patient Instructions (Signed)

## 2016-07-02 ENCOUNTER — Other Ambulatory Visit: Payer: Self-pay | Admitting: Cardiology

## 2016-07-04 IMAGING — CT CT HEART MORP W/ CTA COR W/ SCORE W/ CA W/CM &/OR W/O CM
1 of 19 series · 1 of 20 positions shown, 2 images · IV contrast (Iodine)
Comparison: none

ADDENDUM:
OVER-READ INTERPRETATION  CT CHEST

The following report is an over-read performed by radiologist Dr.
over-read does not include interpretation of cardiac or coronary
anatomy or pathology. The CTA interpretation by the cardiologist is
attached.
Review of the pulmonary parenchyma demonstrates a 4 mm nodule in the
right upper lobe (image 47, series 202). There is mild reticular
pattern at the lung bases
There is no mediastinal lymphadenopathy evident. Limited view of the
upper abdomen is unremarkable. Limited view of the skeleton is
unremarkable.
CLINICAL DATA: Aortic Regurgitation Pre Op Planning
EXAM:
Cardiac Gated  CT
TECHNIQUE: The patient was scanned on a Philips 256 scanner. A 120 kV
retrospective scan was triggered in the descending thoracic aorta at
111 HU's. Gantry rotation speed was 270 msecs and collimation was .9
mm. No beta blockade or nitro were given. The 3D data set was
reconstructed in 10% intervals of the R-R cycle. Systolic and
diastolic phases were analyzed on a dedicated work station using
MPR, MIP and VRT modes. The patient received 80 cc of contrast.

[Series 300: locator · axial · 0.35mm/px · z∈[-106,-106]mm · 1 of 1 slices shown, 2 images]
[im 1/1  vessel]
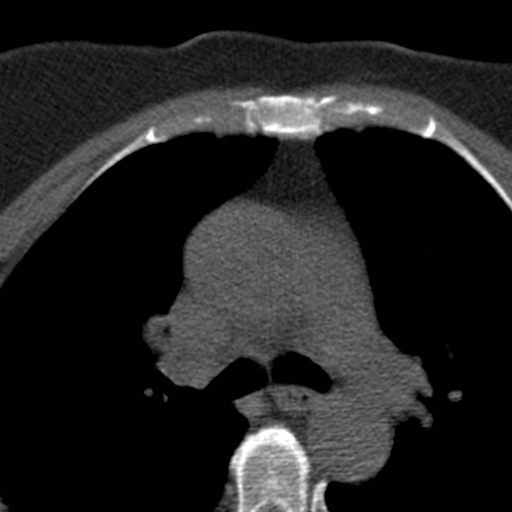
[im 1/1  lung]
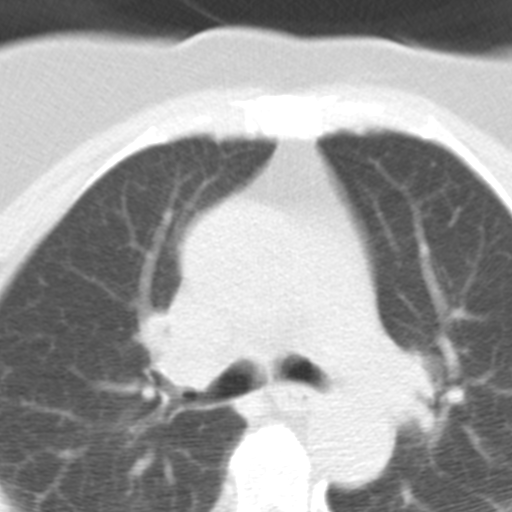

[1 of 20 positions shown; findings below may reference images not displayed]

IMPRESSION: Single right upper lobe 4 mm pulmonary nodule.

If the patient is at high risk for bronchogenic carcinoma, follow-up
chest CT at 1 year is recommended. If the patient is at low risk, no
follow-up is needed. This recommendation follows the consensus
statement: Guidelines for Management of Small Pulmonary Nodules
Detected on CT Scans: A Statement from the [HOSPITAL] as
published in Radiology 3223; [DATE].

These results will be called to the ordering clinician or
representative by the Radiologist Assistant, and communication
documented in the PACS or zVision Dashboard.

MEDICATIONS:
None
FINDINGS: Aortic Valve: Trileaflet No significant calcification Thickened
leaflet tips. Appears to be a large area of central malcoaptation in
diastole

Ascending Aorta:

Short Axis:  38.1 mm

Long Axis:  39.6 mm

Average:  39 mm

Sinotubular Junction:

Short Axis:  34 mm

Long Axis:  36.7 mm

Average:  35.3 mm

Sinus of Valsalva:

Short Axis:  35.8 mm

Long Axis:  39.5 mm

Average 37.6 mm

Descending Thoracic Aorta:  25 mm

The great vessels arise normally from the arch. There is mild
calcification of the arch and descending thoracic aorta. There is no
significant atheroma The left subclavian is

Calcium Score:  198 mostly in the LAD and circumflex

Coronary Arteries: Right dominant with normal origins. LM- calcified
nonobstructive plaque LAD >75% lesion just distal to D2 Circumflex
and RCA without obstructive disease
IMPRESSION: 1) Calcium Score 198 67th percentile for age and sex matched
controls

2) Single vessel obstructive CAD in distal LAD after D2

3) Noncalcified trileaflet aortic valve with large area of
malcoaptation in diastole

4) Aortic Root dilatation with maximal diameter at RPA of 39 mm

5) Normal origin of the great vessels with mild calcific plaque in
the arch and tortuous left subclavian artery and patent BELMINO

Adarsh Tiger

:
Medications:  None

## 2016-09-01 ENCOUNTER — Other Ambulatory Visit: Payer: Self-pay | Admitting: Cardiology

## 2016-09-18 ENCOUNTER — Other Ambulatory Visit: Payer: Self-pay | Admitting: Cardiology

## 2016-12-30 ENCOUNTER — Telehealth: Payer: Self-pay | Admitting: Cardiology

## 2016-12-30 NOTE — Telephone Encounter (Signed)
Walk In Pt Form-[facility] Clearance Dropped off placed in Dr.Turner Doc box.

## 2017-01-09 ENCOUNTER — Ambulatory Visit: Payer: Self-pay | Admitting: Orthopedic Surgery

## 2017-01-19 ENCOUNTER — Encounter (HOSPITAL_COMMUNITY): Payer: Self-pay

## 2017-01-19 ENCOUNTER — Encounter (HOSPITAL_COMMUNITY)
Admission: RE | Admit: 2017-01-19 | Discharge: 2017-01-19 | Disposition: A | Discharge status: Home / Self Care | Payer: Medicare Other | Source: Ambulatory Visit | Attending: Orthopedic Surgery | Admitting: Orthopedic Surgery

## 2017-01-19 DIAGNOSIS — E119 Type 2 diabetes mellitus without complications: Secondary | ICD-10-CM | POA: Diagnosis not present

## 2017-01-19 DIAGNOSIS — K219 Gastro-esophageal reflux disease without esophagitis: Secondary | ICD-10-CM | POA: Insufficient documentation

## 2017-01-19 DIAGNOSIS — I110 Hypertensive heart disease with heart failure: Secondary | ICD-10-CM | POA: Insufficient documentation

## 2017-01-19 DIAGNOSIS — I2510 Atherosclerotic heart disease of native coronary artery without angina pectoris: Secondary | ICD-10-CM | POA: Diagnosis not present

## 2017-01-19 DIAGNOSIS — Z6832 Body mass index (BMI) 32.0-32.9, adult: Secondary | ICD-10-CM | POA: Insufficient documentation

## 2017-01-19 DIAGNOSIS — I5032 Chronic diastolic (congestive) heart failure: Secondary | ICD-10-CM | POA: Insufficient documentation

## 2017-01-19 DIAGNOSIS — E669 Obesity, unspecified: Secondary | ICD-10-CM | POA: Insufficient documentation

## 2017-01-19 DIAGNOSIS — Z01812 Encounter for preprocedural laboratory examination: Secondary | ICD-10-CM | POA: Insufficient documentation

## 2017-01-19 LAB — BASIC METABOLIC PANEL
Anion gap: 9 (ref 5–15)
BUN: 12 mg/dL (ref 6–20)
CO2: 26 mmol/L (ref 22–32)
Calcium: 9.6 mg/dL (ref 8.9–10.3)
Chloride: 100 mmol/L — ABNORMAL LOW (ref 101–111)
Creatinine, Ser: 0.82 mg/dL (ref 0.44–1.00)
GFR calc Af Amer: >60 mL/min (ref >60)
GFR calc non Af Amer: >60 mL/min (ref >60)
Glucose, Bld: 248 mg/dL — ABNORMAL HIGH (ref 65–99)
Potassium: 3.9 mmol/L (ref 3.5–5.1)
Sodium: 135 mmol/L (ref 135–145)

## 2017-01-19 LAB — CBC
HCT: 41.3 % (ref 36.0–46.0)
Hemoglobin: 14.1 g/dL (ref 12.0–15.0)
MCH: 33.3 pg (ref 26.0–34.0)
MCHC: 34.1 g/dL (ref 30.0–36.0)
MCV: 97.6 fL (ref 78.0–100.0)
Platelets: 166 10*3/uL (ref 150–400)
RBC: 4.23 MIL/uL (ref 3.87–5.11)
RDW: 12.6 % (ref 11.5–15.5)
WBC: 8.7 10*3/uL (ref 4.0–10.5)

## 2017-01-19 LAB — SURGICAL PCR SCREEN
MRSA, PCR: NEGATIVE
Staphylococcus aureus: POSITIVE — AB

## 2017-01-19 LAB — GLUCOSE, CAPILLARY: Glucose-Capillary: 279 mg/dL — ABNORMAL HIGH (ref 65–99)

## 2017-01-19 NOTE — Pre-Procedure Instructions (Signed)
GLADINE PLUDE  01/19/2017      [id number] [place], [place] - [address] [place] [address] Phone: [phone] Fax: [phone]    Your procedure is scheduled on Monday, October 22.   Report to [facility] Admitting at 11:30 AM             (posted surgery time 1:22p-3:52p)   Call this number if you have problems the MORNING of surgery:  [phone].   Remember:              4-5 days prior to surgery, STOP TAKING any Vitamins, Herbal Supplements, Anti-inflammatories, Blood thinners   Do not eat food or drink liquids after midnight Sunday. (there is an exception to this 'rule'.)   Take these medicines the morning of surgery with A SIP OF WATER : Coreg, Pepcid.              DO NOT take your diabetes medication the morning of surgery.   Do not wear jewelry, make-up or nail polish.  Do not wear lotions, powders,  perfumes, or deoderant.  Do not shave 48 hours prior to surgery.     Do not bring valuables to the hospital.  [facility] is not responsible for any belongings or valuables.  Contacts, dentures or bridgework may not be worn into surgery.  Leave your suitcase in the car.  After surgery it may be brought to your room.  For patients admitted to the hospital, discharge time will be determined by your treatment team.  Please read over the following fact sheets that you were given. Pain Booklet, MRSA Information and Surgical Site Infection Prevention       [place]- Preparing For Surgery  Before surgery, you can play an important role. Because skin is not sterile, your skin needs to be as free of germs as possible. You can reduce the number of germs on your skin by washing with CHG (chlorahexidine gluconate) Soap before surgery.  CHG is an antiseptic cleaner which kills germs and bonds with the skin to continue killing germs even after washing.  Please do not use if you have an  allergy to CHG or antibacterial soaps. If your skin becomes reddened/irritated stop using the CHG.  Do not shave (including legs and underarms) for at least 48 hours prior to first CHG shower. It is OK to shave your face.  Please follow these instructions carefully.   1. Shower the NIGHT BEFORE SURGERY and the MORNING OF SURGERY with CHG.   2. If you chose to wash your hair, wash your hair first as usual with your normal shampoo.  3. After you shampoo, rinse your hair and body thoroughly to remove the shampoo.  4. Use CHG as you would any other liquid soap. You can apply CHG directly to the skin and wash gently with a scrungie or a clean washcloth.   5. Apply the CHG Soap to your body ONLY FROM THE NECK DOWN.  Do not use on open wounds or open sores. Avoid contact with your eyes, ears, mouth and genitals (private parts). Wash Face and genitals (private parts)  with your normal soap.  6. Wash thoroughly, paying special attention to the area where your surgery will be performed.  7. Thoroughly rinse your body with warm water from the neck down.  8. DO NOT shower/wash with your normal soap after using and rinsing off the CHG  Soap.  9. Pat yourself dry with a CLEAN TOWEL.  10. Wear CLEAN PAJAMAS to bed the night before surgery, wear comfortable clothes the morning of surgery  11. Place CLEAN SHEETS on your bed the night of your first shower and DO NOT SLEEP WITH PETS.    Day of Surgery: Do not apply any deodorants/lotions. Please wear clean clothes to the hospital/surgery center.

## 2017-01-19 NOTE — Progress Notes (Signed)
Cardio is Dr. Fransico Him  LOV 05/2016   Clearance note available. Echo 07/2015 Was told she had murmur (Dr.Turner aware) Bentall procedure in 2015 by Dr. Roxy Manns. PCP is Dr. Particia Nearing.  Lov 12/2016   Patient states she just had A1C at annual physical.  I have requested those results Morning sugars run 100-150.  Her blood sugar here was 259, but she states she just had some M & M's. Aspirin has been stopped as of today 10/15.

## 2017-01-20 NOTE — Progress Notes (Signed)
Anesthesia Chart Review: Patient is a 71 year old female scheduled for right TKA on 01/26/17 by Dr. Dot Lanes.   History includes CAD/severeAR/ascending TAA (s/p CABG [LIMA-LAD] with Bentall Procedure - AVR [#23 [facility] Ease pericardial tissue valve] and ascending aortic root replacement [#26 mm] 10/27/13), cardiomyopathy (EF 30% '15, now normalized), hypercholesterolemia, chronic diastolic CHF, HTN, DM2, thyroiditis '11, GERD, gout, hysterectomy. BMI is consistent with obesity.  PCP is Dr. Lajean Manes at [facility] [place]). He signed a note of clearance for surgery with recommendation to monitor blood sugars.  Cardiologist is Dr. Fransico Him, last visit 07/01/16. She felt patient was low risk for upcoming knee replacements. She signed a note of cardiac clearance.  Meds include ASA 81 mg (last dose 01/19/17), Lipitor, Coreg, colchicine, Pepcid, losartan, metformin, Mirapex, clindamycin PRN dental procedures.   BP 120/60   Pulse 75   Temp 36.7 C   Resp 20   Ht 5' 0.25" (1.53 m)   Wt 169 lb 3.2 oz (76.7 kg)   SpO2 95%   BMI 32.77 kg/m   EKG 07/01/16: NSR, LAD, LVH with QRS widening, cannot rule out septal infarct (age undetermined).  Echo 07/18/15: Study Conclusions - Left ventricle: The cavity size was normal. Wall thickness was   normal. Systolic function was normal. The estimated ejection   fraction was in the range of 50% to 55%. Wall motion was normal;   there were no regional wall motion abnormalities. Doppler   parameters are consistent with abnormal left ventricular   relaxation (grade 1 diastolic dysfunction). - Aortic valve: A bioprosthesis was present. There was trivial   regurgitation. - Mitral valve: Mildly calcified annulus.  RHC/LHC (PRE-BENTALL/CABG) 10/05/13: Final Conclusions:   1. Severe mid LAD stenosis with otherwise minor nonobstructive CAD 2. Mild global LV systolic dysfunction, EF 63% 3. Dilated aortic root with severe aortic valve  insufficiency 4. Normal intracardiac hemodynamics  She had normal PFTs on 10/24/13.   Preoperative labs noted. CBC WNL. Cr 0.82. Non-fasting glucose 248. A1c on 12/26/16 was 7.7 ([facility]). She will get a fasting CBG on arrival.  If no acute changes then I anticipate that she can proceed as planned.  [facility] Short Stay Center/Anesthesiology Phone [phone] 01/20/2017 1:23 PM

## 2017-01-23 ENCOUNTER — Ambulatory Visit: Payer: Self-pay | Admitting: Orthopedic Surgery

## 2017-01-23 MED ORDER — TRANEXAMIC ACID 1000 MG/10ML IV SOLN
1000.0000 mg | INTRAVENOUS | Status: AC
  Administered 2017-01-26: 1000 mg via INTRAVENOUS
  Filled 2017-01-23: qty 10

## 2017-01-23 NOTE — H&P (Signed)
TOTAL KNEE ADMISSION H&P  Patient is being admitted for right total knee arthroplasty.  Subjective:  Chief Complaint:right knee pain.  HPI: [name] [name], 71 y.o. female, has a history of pain and functional disability in the right knee due to arthritis and has failed non-surgical conservative treatments for greater than 12 weeks to includeNSAID's and/or analgesics, corticosteriod injections, flexibility and strengthening excercises, use of assistive devices, weight reduction as appropriate and activity modification.  Onset of symptoms was gradual, starting >10 years ago with gradually worsening course since that time. The patient noted no past surgery on the right knee(s).  Patient currently rates pain in the right knee(s) at 10 out of 10 with activity. Patient has night pain, worsening of pain with activity and weight bearing, pain that interferes with activities of daily living, pain with passive range of motion, crepitus and joint swelling.  Patient has evidence of subchondral cysts, subchondral sclerosis, periarticular osteophytes, joint subluxation and joint space narrowing by imaging studies. There is no active infection.  Patient Active Problem List   Diagnosis Date Noted  . Leg cramps 07/02/2015  . Dyslipidemia, goal LDL below 70 10/21/2014  . S/P Bio-Bentall aortic root replacement with bioprosthetic valve conduit 10/27/2013  . S/P CABG x 1 10/27/2013  . Aortic insufficiency 10/10/2013  . Chronic diastolic CHF (congestive heart failure), NYHA class 2 ([place])   . Coronary artery disease 10/05/2013  . PVC (premature ventricular contraction) 03/30/2013  . Essential hypertension, benign 03/30/2013   Past Medical History:  Diagnosis Date  . Adenomatous colon polyp   . Aortic regurgitation    severe by TEE s/p pericardia tissue AVR  . Arthritis   . Cardiomyopathy ([place])    EF initally 25% but now normalized  . Chronic diastolic CHF (congestive heart failure), NYHA class 2 ([place])   .  Coronary artery disease 10/05/2013   Severe mid LAD stenosis s/p 1 vessel CABG with LIMA to LAD  . Diabetes mellitus without complication ([place])    DX 20 YRS AGO.   TYPE 2  . Dilated aortic root ([place])   . GERD (gastroesophageal reflux disease)    Tums prn  . Gout   . Gout   . Heart murmur   . Hypercholesteremia    LDL goal < 70  . Hypertension   . S/P Bio-Bentall aortic root replacement with bioprosthetic valve conduit 10/27/2013   23 mm [facility] Ease bovine pericardial tissue valve and 26 mm Vascutek gelweave Val-Salva aortic graft  . S/P CABG x 1 10/27/2013   LIMA to LAD  . Thyroiditis 10/2009   lab and u/s-thyroid function normalized in 9/11    Past Surgical History:  Procedure Laterality Date  . ABDOMINAL HYSTERECTOMY    . AORTIC VALVE REPLACEMENT N/A 10/27/2013   Procedure: AORTIC VALVE REPLACEMENT (AVR);  Surgeon: Rexene Alberts, MD;  Location: [place];  Service: Open Heart Surgery;  Laterality: N/A;  . ASCENDING AORTIC ROOT REPLACEMENT N/A 10/27/2013   Procedure: ASCENDING AORTIC ROOT REPLACEMENT;  Surgeon: Rexene Alberts, MD;  Location: [place];  Service: Open Heart Surgery;  Laterality: N/A;  . BUNIONECTOMY     bilateral  . CARDIAC CATHETERIZATION    . CARDIAC VALVE REPLACEMENT     AORTIC VALVE  . CARPAL TUNNEL RELEASE Right   . CATARACT EXTRACTION Bilateral   . COLONOSCOPY    . COLONOSCOPY WITH PROPOFOL N/A 05/08/2015   Procedure: COLONOSCOPY WITH PROPOFOL;  Surgeon: Garlan Fair, MD;  Location: [facility];  Service:  Endoscopy;  Laterality: N/A;  . CORONARY ARTERY BYPASS GRAFT N/A 10/27/2013   Procedure: CORONARY ARTERY BYPASS GRAFTING (CABG) x 1 - LIMA to LAD;  Surgeon: Rexene Alberts, MD;  Location: [place];  Service: Open Heart Surgery;  Laterality: N/A;  . EYE SURGERY     BILATERAL CATARACTS  . INTRAOPERATIVE TRANSESOPHAGEAL ECHOCARDIOGRAM N/A 10/27/2013   Procedure: INTRAOPERATIVE TRANSESOPHAGEAL ECHOCARDIOGRAM;  Surgeon: Rexene Alberts, MD;  Location: [place];   Service: Open Heart Surgery;  Laterality: N/A;  . LEFT AND RIGHT HEART CATHETERIZATION WITH CORONARY ANGIOGRAM N/A 10/05/2013   Procedure: LEFT AND RIGHT HEART CATHETERIZATION WITH CORONARY ANGIOGRAM;  Surgeon: Blane Ohara, MD;  Location: [facility] CATH LAB;  Service: Cardiovascular;  Laterality: N/A;  . TEE WITHOUT CARDIOVERSION N/A 10/04/2013   Procedure: TRANSESOPHAGEAL ECHOCARDIOGRAM (TEE);  Surgeon: Sueanne Margarita, MD;  Location: [facility] ENDOSCOPY;  Service: Cardiovascular;  Laterality: N/A;  . tubular adenomatous polyp colonoscopy  12/31/2009    Current Outpatient Prescriptions  Medication Sig Dispense Refill Last Dose  . acetaminophen (TYLENOL) 650 MG CR tablet Take 1,300 mg by mouth at bedtime as needed for pain.     Marland Kitchen aspirin EC 81 MG tablet Take 81 mg by mouth daily.   Taking  . atorvastatin (LIPITOR) 10 MG tablet TAKE 1 TABLET BY MOUTH EVERY DAY AT 6PM 90 tablet 2   . Calcium-Vitamin D (CALTRATE 600 PLUS-VIT D PO) Take 1 tablet by mouth daily.   Taking  . carvedilol (COREG) 6.25 MG tablet TAKE 1 TABLET BY MOUTH TWICE A DAY WITH A MEAL 180 tablet 2   . clindamycin (CLEOCIN) 150 MG capsule Take 4 capsules by mouth as needed. (Take one (1) hour prior to dental procedures)  2 Taking  . colchicine (COLCRYS) 0.6 MG tablet Take 0.6 mg by mouth daily.   Taking  . famotidine (PEPCID) 10 MG tablet Take 10 mg by mouth daily as needed for heartburn.    Taking  . GLUCOSAMINE-CHONDROITIN PO Take 1 tablet by mouth daily. Glucosamine 1200 mg, chondroitin 1500 mg   Taking  . losartan (COZAAR) 25 MG tablet TAKE 1 TABLET BY MOUTH EVERY DAY 90 tablet 2   . metFORMIN (GLUCOPHAGE-XR) 500 MG 24 hr tablet Take 1,000 mg by mouth at bedtime.    Taking  . Multiple Vitamin (MULTIVITAMIN WITH MINERALS) TABS tablet Take 1 tablet by mouth daily. Centrum Silver   Taking  . pramipexole (MIRAPEX) 0.125 MG tablet Take 0.125 mg by mouth at bedtime.  6    No current facility-administered medications for this visit.     Facility-Administered Medications Ordered in Other Visits  Medication Dose Route Frequency Provider Last Rate Last Dose  . [START ON 01/26/2017] tranexamic acid (CYKLOKAPRON) 1,000 mg in sodium chloride 0.9 % 100 mL IVPB  1,000 mg Intravenous To [facility] [place] Gambone, Aaron Edelman, MD       Allergies  Allergen Reactions  . Atenolol Shortness Of Breath and Other (See Comments)    Edema and Headache  . Lisinopril Cough  . Penicillins Other (See Comments)    Has patient had a PCN reaction causing immediate rash, facial/tongue/throat swelling, SOB or lightheadedness with hypotension: No Has patient had a PCN reaction causing severe rash involving mucus membranes or skin necrosis: No Has patient had a PCN reaction that required hospitalization No Has patient had a PCN reaction occurring within the last 10 years: No If all of the above answers are "NO", then may proceed with Cephalosporin use.G Benzathine: Local injection rash  .  Sulfa Antibiotics Hives and Rash    Social History  Substance Use Topics  . Smoking status: Never Smoker  . Smokeless tobacco: Never Used  . Alcohol use No    Family History  Problem Relation Age of Onset  . Hypertension Mother   . Diabetes Mother   . Arthritis/Rheumatoid Mother   . Heart attack Father   . Stroke Sister   . Cancer Brother      Review of Systems  Constitutional: Negative.   HENT: Negative.   Eyes: Negative.   Respiratory: Negative.   Cardiovascular: Negative.   Gastrointestinal: Negative.   Genitourinary: Negative.   Musculoskeletal: Positive for joint pain.  Skin: Negative.   Neurological: Negative.   Endo/Heme/Allergies: Negative.   Psychiatric/Behavioral: Negative.     Objective:  Physical Exam  Vitals reviewed. Constitutional: She is oriented to person, place, and time. She appears well-developed and well-nourished.  HENT:  Head: Normocephalic.  Left Ear: External ear normal.  Eyes: Pupils are equal, round, and reactive to light.  Conjunctivae and EOM are normal.  Neck: Normal range of motion. Neck supple.  Cardiovascular: Normal rate, regular rhythm and intact distal pulses.   Respiratory: Effort normal. No respiratory distress.  GI: Soft. She exhibits no distension.  Genitourinary:  Genitourinary Comments: deferred  Musculoskeletal:       Right knee: She exhibits swelling, effusion, deformity, abnormal alignment and bony tenderness. Tenderness found. Medial joint line and lateral joint line tenderness noted.  Neurological: She is alert and oriented to person, place, and time. She has normal reflexes.  Skin: Skin is warm and dry.  Psychiatric: She has a normal mood and affect. Her behavior is normal. Judgment and thought content normal.    Vital signs in last 24 hours: @VSRANGES @  Labs:   Estimated body mass index is 32.77 kg/m as calculated from the following:   Height as of 01/19/17: 5' 0.25" (1.53 m).   Weight as of 01/19/17: 76.7 kg (169 lb 3.2 oz).   Imaging Review Plain radiographs demonstrate severe degenerative joint disease of the right knee(s). The overall alignment issignificant varus. The bone quality appears to be adequate for age and reported activity level.  Assessment/Plan:  End stage arthritis, right knee   The patient history, physical examination, clinical judgment of the provider and imaging studies are consistent with end stage degenerative joint disease of the right knee(s) and total knee arthroplasty is deemed medically necessary. The treatment options including medical management, injection therapy arthroscopy and arthroplasty were discussed at length. The risks and benefits of total knee arthroplasty were presented and reviewed. The risks due to aseptic loosening, infection, stiffness, patella tracking problems, thromboembolic complications and other imponderables were discussed. The patient acknowledged the explanation, agreed to proceed with the plan and consent was signed. Patient  is being admitted for inpatient treatment for surgery, pain control, PT, OT, prophylactic antibiotics, VTE prophylaxis, progressive ambulation and ADL's and discharge planning. The patient is planning to be discharged home with outpatient PT

## 2017-01-26 ENCOUNTER — Encounter (HOSPITAL_COMMUNITY)
Admission: AD | Disposition: A | Discharge status: Home / Self Care | Payer: Self-pay | Source: Ambulatory Visit | Attending: Orthopedic Surgery

## 2017-01-26 ENCOUNTER — Encounter (HOSPITAL_COMMUNITY): Payer: Self-pay | Admitting: *Deleted

## 2017-01-26 ENCOUNTER — Inpatient Hospital Stay (HOSPITAL_COMMUNITY): Payer: Medicare Other

## 2017-01-26 ENCOUNTER — Ambulatory Visit (HOSPITAL_COMMUNITY): Payer: Medicare Other | Admitting: Vascular Surgery

## 2017-01-26 ENCOUNTER — Inpatient Hospital Stay (HOSPITAL_COMMUNITY)
Admission: AD | Admit: 2017-01-26 | Discharge: 2017-01-28 | DRG: 470 | Disposition: A | Discharge status: Home / Self Care | Payer: Medicare Other | Source: Ambulatory Visit | Attending: Orthopedic Surgery | Admitting: Orthopedic Surgery

## 2017-01-26 ENCOUNTER — Ambulatory Visit (HOSPITAL_COMMUNITY): Payer: Medicare Other | Admitting: Certified Registered Nurse Anesthetist

## 2017-01-26 DIAGNOSIS — Z8249 Family history of ischemic heart disease and other diseases of the circulatory system: Secondary | ICD-10-CM

## 2017-01-26 DIAGNOSIS — I110 Hypertensive heart disease with heart failure: Secondary | ICD-10-CM | POA: Diagnosis present

## 2017-01-26 DIAGNOSIS — K219 Gastro-esophageal reflux disease without esophagitis: Secondary | ICD-10-CM | POA: Diagnosis present

## 2017-01-26 DIAGNOSIS — I429 Cardiomyopathy, unspecified: Secondary | ICD-10-CM | POA: Diagnosis present

## 2017-01-26 DIAGNOSIS — I5032 Chronic diastolic (congestive) heart failure: Secondary | ICD-10-CM | POA: Diagnosis present

## 2017-01-26 DIAGNOSIS — Z833 Family history of diabetes mellitus: Secondary | ICD-10-CM

## 2017-01-26 DIAGNOSIS — Z09 Encounter for follow-up examination after completed treatment for conditions other than malignant neoplasm: Secondary | ICD-10-CM

## 2017-01-26 DIAGNOSIS — E1151 Type 2 diabetes mellitus with diabetic peripheral angiopathy without gangrene: Secondary | ICD-10-CM | POA: Diagnosis present

## 2017-01-26 DIAGNOSIS — E7800 Pure hypercholesterolemia, unspecified: Secondary | ICD-10-CM | POA: Diagnosis present

## 2017-01-26 DIAGNOSIS — M1711 Unilateral primary osteoarthritis, right knee: Secondary | ICD-10-CM | POA: Diagnosis present

## 2017-01-26 DIAGNOSIS — M25561 Pain in right knee: Secondary | ICD-10-CM | POA: Diagnosis present

## 2017-01-26 DIAGNOSIS — Z7984 Long term (current) use of oral hypoglycemic drugs: Secondary | ICD-10-CM

## 2017-01-26 DIAGNOSIS — Z951 Presence of aortocoronary bypass graft: Secondary | ICD-10-CM

## 2017-01-26 DIAGNOSIS — Z952 Presence of prosthetic heart valve: Secondary | ICD-10-CM | POA: Diagnosis not present

## 2017-01-26 DIAGNOSIS — I2510 Atherosclerotic heart disease of native coronary artery without angina pectoris: Secondary | ICD-10-CM | POA: Diagnosis present

## 2017-01-26 HISTORY — PX: KNEE ARTHROPLASTY: SHX992

## 2017-01-26 LAB — GLUCOSE, CAPILLARY
Glucose-Capillary: 159 mg/dL — ABNORMAL HIGH (ref 65–99)
Glucose-Capillary: 150 mg/dL — ABNORMAL HIGH (ref 65–99)
Glucose-Capillary: 330 mg/dL — ABNORMAL HIGH (ref 65–99)

## 2017-01-26 LAB — TYPE AND SCREEN
ABO/RH(D): O NEG
Antibody Screen: POSITIVE

## 2017-01-26 SURGERY — ARTHROPLASTY, KNEE, TOTAL, USING IMAGELESS COMPUTER-ASSISTED NAVIGATION
Anesthesia: Spinal | Site: Knee | Laterality: Right

## 2017-01-26 MED ORDER — ACETAMINOPHEN 10 MG/ML IV SOLN
INTRAVENOUS | Status: AC
  Filled 2017-01-26: qty 100

## 2017-01-26 MED ORDER — ONDANSETRON HCL 4 MG/2ML IJ SOLN
4.0000 mg | Freq: Four times a day (QID) | INTRAMUSCULAR | Status: DC | PRN
  Administered 2017-01-26: 4 mg via INTRAVENOUS
  Filled 2017-01-26: qty 2

## 2017-01-26 MED ORDER — ROPIVACAINE HCL 7.5 MG/ML IJ SOLN
INTRAMUSCULAR | Status: DC | PRN
  Administered 2017-01-26: 25 mL via PERINEURAL

## 2017-01-26 MED ORDER — BUPIVACAINE-EPINEPHRINE (PF) 0.5% -1:200000 IJ SOLN
INTRAMUSCULAR | Status: DC | PRN
  Administered 2017-01-26: 30 mL

## 2017-01-26 MED ORDER — FENTANYL CITRATE (PF) 100 MCG/2ML IJ SOLN
INTRAMUSCULAR | Status: AC
  Filled 2017-01-26: qty 2

## 2017-01-26 MED ORDER — BUPIVACAINE IN DEXTROSE 0.75-8.25 % IT SOLN
INTRATHECAL | Status: DC | PRN
  Administered 2017-01-26: 1.6 mg via INTRATHECAL

## 2017-01-26 MED ORDER — METOCLOPRAMIDE HCL 5 MG/ML IJ SOLN: 5.0000 mg | Freq: Three times a day (TID) | INTRAMUSCULAR | Status: DC | PRN

## 2017-01-26 MED ORDER — SODIUM CHLORIDE 0.9 % IJ SOLN
INTRAMUSCULAR | Status: DC | PRN
  Administered 2017-01-26: 30 mL

## 2017-01-26 MED ORDER — PROPOFOL 10 MG/ML IV BOLUS
INTRAVENOUS | Status: DC | PRN
  Administered 2017-01-26: 20 mg via INTRAVENOUS

## 2017-01-26 MED ORDER — SENNA 8.6 MG PO TABS
2.0000 | ORAL_TABLET | Freq: Every day | ORAL | Status: DC
  Administered 2017-01-26 – 2017-01-27 (×2): 17.2 mg via ORAL
  Filled 2017-01-26 (×2): qty 2

## 2017-01-26 MED ORDER — ATORVASTATIN CALCIUM 10 MG PO TABS
10.0000 mg | ORAL_TABLET | Freq: Every day | ORAL | Status: DC
  Administered 2017-01-27: 10 mg via ORAL
  Filled 2017-01-26 (×2): qty 1

## 2017-01-26 MED ORDER — KETOROLAC TROMETHAMINE 30 MG/ML IJ SOLN
INTRAMUSCULAR | Status: DC | PRN
  Administered 2017-01-26: 30 mg

## 2017-01-26 MED ORDER — LOSARTAN POTASSIUM 25 MG PO TABS
25.0000 mg | ORAL_TABLET | Freq: Every day | ORAL | Status: DC
  Administered 2017-01-26 – 2017-01-28 (×3): 25 mg via ORAL
  Filled 2017-01-26 (×3): qty 1

## 2017-01-26 MED ORDER — PROPOFOL 500 MG/50ML IV EMUL
INTRAVENOUS | Status: DC | PRN
  Administered 2017-01-26: 50 ug/kg/min via INTRAVENOUS

## 2017-01-26 MED ORDER — COLCHICINE 0.6 MG PO TABS
0.6000 mg | ORAL_TABLET | Freq: Every day | ORAL | Status: DC
  Administered 2017-01-26 – 2017-01-28 (×3): 0.6 mg via ORAL
  Filled 2017-01-26 (×3): qty 1

## 2017-01-26 MED ORDER — TRANEXAMIC ACID 1000 MG/10ML IV SOLN
1000.0000 mg | Freq: Once | INTRAVENOUS | Status: AC
  Administered 2017-01-26: 1000 mg via INTRAVENOUS
  Filled 2017-01-26: qty 10

## 2017-01-26 MED ORDER — MENTHOL 3 MG MT LOZG: 1.0000 | LOZENGE | OROMUCOSAL | Status: DC | PRN

## 2017-01-26 MED ORDER — PHENYLEPHRINE HCL 10 MG/ML IJ SOLN
INTRAMUSCULAR | Status: DC | PRN
  Administered 2017-01-26: 25 ug/min via INTRAVENOUS

## 2017-01-26 MED ORDER — ONDANSETRON HCL 4 MG PO TABS: 4.0000 mg | ORAL_TABLET | Freq: Four times a day (QID) | ORAL | Status: DC | PRN

## 2017-01-26 MED ORDER — PRAMIPEXOLE DIHYDROCHLORIDE 0.125 MG PO TABS
0.1250 mg | ORAL_TABLET | Freq: Every day | ORAL | Status: DC
  Administered 2017-01-26 – 2017-01-27 (×2): 0.125 mg via ORAL
  Filled 2017-01-26 (×2): qty 1

## 2017-01-26 MED ORDER — ACETAMINOPHEN 650 MG RE SUPP: 650.0000 mg | RECTAL | Status: DC | PRN

## 2017-01-26 MED ORDER — KETOROLAC TROMETHAMINE 15 MG/ML IJ SOLN
7.5000 mg | Freq: Four times a day (QID) | INTRAMUSCULAR | Status: AC
  Administered 2017-01-26 – 2017-01-27 (×4): 7.5 mg via INTRAVENOUS
  Filled 2017-01-26 (×4): qty 1

## 2017-01-26 MED ORDER — BUPIVACAINE-EPINEPHRINE (PF) 0.5% -1:200000 IJ SOLN
INTRAMUSCULAR | Status: AC
  Filled 2017-01-26: qty 30

## 2017-01-26 MED ORDER — HYDROCODONE-ACETAMINOPHEN 5-325 MG PO TABS
2.0000 | ORAL_TABLET | ORAL | Status: DC | PRN
  Administered 2017-01-27 – 2017-01-28 (×4): 2 via ORAL
  Filled 2017-01-26 (×5): qty 2

## 2017-01-26 MED ORDER — ACETAMINOPHEN 10 MG/ML IV SOLN
INTRAVENOUS | Status: DC | PRN
  Administered 2017-01-26: 1000 mg via INTRAVENOUS

## 2017-01-26 MED ORDER — ASPIRIN 81 MG PO CHEW
81.0000 mg | CHEWABLE_TABLET | Freq: Two times a day (BID) | ORAL | Status: DC
  Administered 2017-01-26 – 2017-01-28 (×4): 81 mg via ORAL
  Filled 2017-01-26 (×4): qty 1

## 2017-01-26 MED ORDER — KETOROLAC TROMETHAMINE 30 MG/ML IJ SOLN
INTRAMUSCULAR | Status: AC
  Filled 2017-01-26: qty 1

## 2017-01-26 MED ORDER — METFORMIN HCL ER 500 MG PO TB24
1000.0000 mg | ORAL_TABLET | Freq: Every day | ORAL | Status: DC
  Administered 2017-01-26 – 2017-01-27 (×2): 1000 mg via ORAL
  Filled 2017-01-26 (×2): qty 2

## 2017-01-26 MED ORDER — CLINDAMYCIN PHOSPHATE 900 MG/50ML IV SOLN
900.0000 mg | INTRAVENOUS | Status: AC
  Administered 2017-01-26: 900 mg via INTRAVENOUS

## 2017-01-26 MED ORDER — FAMOTIDINE 20 MG PO TABS: 10.0000 mg | ORAL_TABLET | Freq: Every day | ORAL | Status: DC | PRN

## 2017-01-26 MED ORDER — FENTANYL CITRATE (PF) 250 MCG/5ML IJ SOLN
INTRAMUSCULAR | Status: AC
  Filled 2017-01-26: qty 5

## 2017-01-26 MED ORDER — ACETAMINOPHEN 325 MG PO TABS: 650.0000 mg | ORAL_TABLET | ORAL | Status: DC | PRN

## 2017-01-26 MED ORDER — LACTATED RINGERS IV SOLN
INTRAVENOUS | Status: DC
  Administered 2017-01-26 (×2): via INTRAVENOUS

## 2017-01-26 MED ORDER — HYDROMORPHONE HCL 1 MG/ML IJ SOLN
INTRAMUSCULAR | Status: AC
  Administered 2017-01-26: 1 mg via INTRAVENOUS
  Filled 2017-01-26: qty 1

## 2017-01-26 MED ORDER — DIPHENHYDRAMINE HCL 12.5 MG/5ML PO ELIX: 12.5000 mg | ORAL_SOLUTION | ORAL | Status: DC | PRN

## 2017-01-26 MED ORDER — DOCUSATE SODIUM 100 MG PO CAPS
100.0000 mg | ORAL_CAPSULE | Freq: Two times a day (BID) | ORAL | Status: DC
  Administered 2017-01-26 – 2017-01-28 (×4): 100 mg via ORAL
  Filled 2017-01-26 (×4): qty 1

## 2017-01-26 MED ORDER — METHOCARBAMOL 500 MG PO TABS
500.0000 mg | ORAL_TABLET | Freq: Four times a day (QID) | ORAL | Status: DC | PRN
  Administered 2017-01-26 – 2017-01-27 (×4): 500 mg via ORAL
  Filled 2017-01-26 (×4): qty 1

## 2017-01-26 MED ORDER — CLINDAMYCIN PHOSPHATE 600 MG/50ML IV SOLN
600.0000 mg | Freq: Four times a day (QID) | INTRAVENOUS | Status: AC
  Administered 2017-01-26 – 2017-01-27 (×2): 600 mg via INTRAVENOUS
  Filled 2017-01-26 (×2): qty 50

## 2017-01-26 MED ORDER — SODIUM CHLORIDE 0.9 % IV SOLN
INTRAVENOUS | Status: DC
  Administered 2017-01-26: 19:00:00 via INTRAVENOUS

## 2017-01-26 MED ORDER — CARVEDILOL 6.25 MG PO TABS
6.2500 mg | ORAL_TABLET | Freq: Two times a day (BID) | ORAL | Status: DC
  Administered 2017-01-27 – 2017-01-28 (×3): 6.25 mg via ORAL
  Filled 2017-01-26 (×4): qty 1

## 2017-01-26 MED ORDER — METOCLOPRAMIDE HCL 5 MG PO TABS: 5.0000 mg | ORAL_TABLET | Freq: Three times a day (TID) | ORAL | Status: DC | PRN

## 2017-01-26 MED ORDER — ALUM & MAG HYDROXIDE-SIMETH 200-200-20 MG/5ML PO SUSP
30.0000 mL | ORAL | Status: DC | PRN
  Administered 2017-01-27: 30 mL via ORAL
  Filled 2017-01-26: qty 30

## 2017-01-26 MED ORDER — DEXAMETHASONE SODIUM PHOSPHATE 10 MG/ML IJ SOLN
10.0000 mg | Freq: Once | INTRAMUSCULAR | Status: AC
  Administered 2017-01-27: 10 mg via INTRAVENOUS
  Filled 2017-01-26: qty 1

## 2017-01-26 MED ORDER — POLYETHYLENE GLYCOL 3350 17 G PO PACK: 17.0000 g | PACK | Freq: Every day | ORAL | Status: DC | PRN

## 2017-01-26 MED ORDER — FENTANYL CITRATE (PF) 100 MCG/2ML IJ SOLN
50.0000 ug | Freq: Once | INTRAMUSCULAR | Status: AC
  Administered 2017-01-26: 50 ug via INTRAVENOUS

## 2017-01-26 MED ORDER — PHENOL 1.4 % MT LIQD: 1.0000 | OROMUCOSAL | Status: DC | PRN

## 2017-01-26 MED ORDER — SODIUM CHLORIDE 0.9 % IV SOLN: INTRAVENOUS | Status: DC

## 2017-01-26 MED ORDER — HYDROCODONE-ACETAMINOPHEN 5-325 MG PO TABS
1.0000 | ORAL_TABLET | ORAL | Status: DC | PRN
  Administered 2017-01-26 – 2017-01-27 (×3): 1 via ORAL
  Filled 2017-01-26 (×3): qty 1

## 2017-01-26 MED ORDER — MIDAZOLAM HCL 2 MG/2ML IJ SOLN
2.0000 mg | Freq: Once | INTRAMUSCULAR | Status: AC
  Administered 2017-01-26: 2 mg via INTRAVENOUS

## 2017-01-26 MED ORDER — CLINDAMYCIN PHOSPHATE 900 MG/50ML IV SOLN
INTRAVENOUS | Status: AC
  Filled 2017-01-26: qty 50

## 2017-01-26 MED ORDER — CHLORHEXIDINE GLUCONATE 4 % EX LIQD: 60.0000 mL | Freq: Once | CUTANEOUS | Status: DC

## 2017-01-26 MED ORDER — SODIUM CHLORIDE 0.9 % IR SOLN
Status: DC | PRN
  Administered 2017-01-26: 3000 mL

## 2017-01-26 MED ORDER — HYDROMORPHONE HCL 1 MG/ML IJ SOLN
0.5000 mg | INTRAMUSCULAR | Status: DC | PRN
  Administered 2017-01-26 (×2): 1 mg via INTRAVENOUS
  Filled 2017-01-26: qty 1

## 2017-01-26 MED ORDER — MIDAZOLAM HCL 2 MG/2ML IJ SOLN
INTRAMUSCULAR | Status: AC
  Filled 2017-01-26: qty 2

## 2017-01-26 MED ORDER — POVIDONE-IODINE 10 % EX SWAB
2.0000 "application " | Freq: Once | CUTANEOUS | Status: AC
  Administered 2017-01-26: 2 via TOPICAL

## 2017-01-26 MED ORDER — METHOCARBAMOL 1000 MG/10ML IJ SOLN
500.0000 mg | Freq: Four times a day (QID) | INTRAVENOUS | Status: DC | PRN
  Filled 2017-01-26: qty 5

## 2017-01-26 SURGICAL SUPPLY — 51 items
ALCOHOL ISOPROPYL (RUBBING) (MISCELLANEOUS) ×2 IMPLANT
BANDAGE ELASTIC 6 VELCRO ST LF (GAUZE/BANDAGES/DRESSINGS) ×2 IMPLANT
BLADE SAW RECIP 87.9 MT (BLADE) ×2 IMPLANT
BNDG ELASTIC 6X10 VLCR STRL LF (GAUZE/BANDAGES/DRESSINGS) ×2 IMPLANT
CAPT KNEE TRIATH TK-4 ×2 IMPLANT
CHLORAPREP W/TINT 26ML (MISCELLANEOUS) ×4 IMPLANT
COVER SURGICAL LIGHT HANDLE (MISCELLANEOUS) ×2 IMPLANT
CUFF TOURNIQUET SINGLE 34IN LL (TOURNIQUET CUFF) ×2 IMPLANT
DERMABOND ADHESIVE PROPEN (GAUZE/BANDAGES/DRESSINGS) ×1
DERMABOND ADVANCED (GAUZE/BANDAGES/DRESSINGS) ×1
DERMABOND ADVANCED .7 DNX12 (GAUZE/BANDAGES/DRESSINGS) ×1 IMPLANT
DERMABOND ADVANCED .7 DNX6 (GAUZE/BANDAGES/DRESSINGS) ×1 IMPLANT
DRAIN HEMOVAC 7FR (DRAIN) IMPLANT
DRAPE EXTREMITY T 121X128X90 (DRAPE) ×2 IMPLANT
DRAPE U-SHAPE 47X51 STRL (DRAPES) ×2 IMPLANT
DRAPE UNIVERSAL PACK (DRAPES) ×2 IMPLANT
DRSG AQUACEL AG ADV 3.5X14 (GAUZE/BANDAGES/DRESSINGS) ×2 IMPLANT
ELECT BLADE 4.0 EZ CLEAN MEGAD (MISCELLANEOUS) ×2
ELECT CAUTERY BLADE 6.4 (BLADE) ×2 IMPLANT
ELECT PENCIL ROCKER SW 15FT (MISCELLANEOUS) ×2 IMPLANT
ELECT REM PT RETURN 9FT ADLT (ELECTROSURGICAL) ×2
ELECTRODE BLDE 4.0 EZ CLN MEGD (MISCELLANEOUS) ×1 IMPLANT
ELECTRODE REM PT RTRN 9FT ADLT (ELECTROSURGICAL) ×1 IMPLANT
EVACUATOR 1/8 PVC DRAIN (DRAIN) IMPLANT
GLOVE BIO SURGEON STRL SZ8.5 (GLOVE) ×4 IMPLANT
GLOVE BIOGEL PI IND STRL 8.5 (GLOVE) ×1 IMPLANT
GLOVE BIOGEL PI INDICATOR 8.5 (GLOVE) ×1
GOWN STRL REUS W/TWL 2XL LVL3 (GOWN DISPOSABLE) ×2 IMPLANT
HANDPIECE INTERPULSE COAX TIP (DISPOSABLE) ×1
HOOD PEEL AWAY FLYTE STAYCOOL (MISCELLANEOUS) ×4 IMPLANT
KIT BASIN OR (CUSTOM PROCEDURE TRAY) ×2 IMPLANT
MANIFOLD NEPTUNE II (INSTRUMENTS) ×2 IMPLANT
NEEDLE SPNL 18GX3.5 QUINCKE PK (NEEDLE) ×4 IMPLANT
PACK TOTAL JOINT (CUSTOM PROCEDURE TRAY) ×2 IMPLANT
PACK TOTAL KNEE CUSTOM (KITS) ×2 IMPLANT
SAW OSC TIP CART 19.5X105X1.3 (SAW) ×2 IMPLANT
SEALER BIPOLAR AQUA 6.0 (INSTRUMENTS) ×2 IMPLANT
SEALER BIPOLAR AQUA 9.5 XL (INSTRUMENTS) ×2 IMPLANT
SET HNDPC FAN SPRY TIP SCT (DISPOSABLE) ×1 IMPLANT
SET PAD KNEE POSITIONER (MISCELLANEOUS) ×2 IMPLANT
SUT MNCRL AB 3-0 PS2 18 (SUTURE) ×2 IMPLANT
SUT MNCRL AB 3-0 PS2 27 (SUTURE) ×2 IMPLANT
SUT MON AB 2-0 CT1 36 (SUTURE) ×4 IMPLANT
SUT VIC AB 1 CTX 27 (SUTURE) ×4 IMPLANT
SUT VIC AB 2-0 CT1 27 (SUTURE)
SUT VIC AB 2-0 CT1 TAPERPNT 27 (SUTURE) IMPLANT
SUT VLOC 180 0 24IN GS25 (SUTURE) ×2 IMPLANT
SYR 50ML LL SCALE MARK (SYRINGE) ×4 IMPLANT
TOWER CARTRIDGE SMART MIX (DISPOSABLE) IMPLANT
TRAY CATH 16FR W/PLASTIC CATH (SET/KITS/TRAYS/PACK) IMPLANT
WRAP KNEE MAXI GEL POST OP (GAUZE/BANDAGES/DRESSINGS) ×2 IMPLANT

## 2017-01-26 NOTE — Anesthesia Procedure Notes (Signed)
Spinal  Patient location during procedure: OR Start time: 01/26/2017 1:34 PM End time: 01/26/2017 1:40 PM Staffing Anesthesiologist: Ivyrose Hashman Preanesthetic Checklist Completed: patient identified, site marked, surgical consent, pre-op evaluation, timeout performed, IV checked, risks and benefits discussed and monitors and equipment checked Spinal Block Patient position: sitting Prep: DuraPrep Patient monitoring: heart rate, cardiac monitor, continuous pulse ox and blood pressure Approach: midline Location: L3-4 Injection technique: single-shot Needle Needle type: Sprotte  Needle gauge: 24 G Needle length: 9 cm Assessment Sensory level: T4

## 2017-01-26 NOTE — Anesthesia Procedure Notes (Signed)
Procedure Name: MAC Date/Time: 01/26/2017 1:41 PM Performed by: Everlean Cherry A Pre-anesthesia Checklist: Patient identified, Emergency Drugs available, Suction available and Patient being monitored Patient Re-evaluated:Patient Re-evaluated prior to induction Oxygen Delivery Method: Simple face mask

## 2017-01-26 NOTE — Interval H&P Note (Signed)
History and Physical Interval Note:  01/26/2017 12:15 PM  [name] [name]  has presented today for surgery, with the diagnosis of Right knee degenerative joint disease   The various methods of treatment have been discussed with the patient and family. After consideration of risks, benefits and other options for treatment, the patient has consented to  Procedure(s) with comments: RIGHT TOTAL KNEE ARTHROPLASTY WITH COMPUTER NAVIGATION (Right) - Needs RNFA as a surgical intervention .  The patient's history has been reviewed, patient examined, no change in status, stable for surgery.  I have reviewed the patient's chart and labs.  Questions were answered to the patient's satisfaction.     Zael Shuman, [name]

## 2017-01-26 NOTE — Transfer of Care (Signed)
Immediate Anesthesia Transfer of Care Note  Patient: [name] [name]  Procedure(s) Performed: RIGHT TOTAL KNEE ARTHROPLASTY WITH COMPUTER NAVIGATION (Right Knee)  Patient Location: PACU  Anesthesia Type:Spinal and MAC combined with regional for post-op pain  Level of Consciousness: awake, alert , oriented and patient cooperative  Airway & Oxygen Therapy: Patient Spontanous Breathing  Post-op Assessment: Report given to RN and Post -op Vital signs reviewed and stable  Post vital signs: Reviewed and stable  Last Vitals:  Vitals:   01/26/17 1300 01/26/17 1303  BP: 135/60 135/60  Pulse: 63 62  Resp: (!) 27 13  Temp:    SpO2: 100% 100%    Last Pain:  Vitals:   01/26/17 1141  TempSrc: Oral         Complications: No apparent anesthesia complications

## 2017-01-26 NOTE — Anesthesia Postprocedure Evaluation (Signed)
Anesthesia Post Note  Patient: [name] [name]  Procedure(s) Performed: RIGHT TOTAL KNEE ARTHROPLASTY WITH COMPUTER NAVIGATION (Right Knee)     Patient location during evaluation: PACU Anesthesia Type: Spinal Level of consciousness: awake and alert, patient cooperative and oriented Pain management: pain level controlled Vital Signs Assessment: post-procedure vital signs reviewed and stable Respiratory status: spontaneous breathing, nonlabored ventilation, patient connected to nasal cannula oxygen and respiratory function stable Cardiovascular status: blood pressure returned to baseline and stable Postop Assessment: no apparent nausea or vomiting and spinal receding Anesthetic complications: no    Last Vitals:  Vitals:   01/26/17 1303 01/26/17 1602  BP: 135/60 127/75  Pulse: 62 63  Resp: 13   Temp:    SpO2: 100% 100%    Last Pain:  Vitals:   01/26/17 1141  TempSrc: Oral                 [name],E. Oracio Galen

## 2017-01-26 NOTE — Op Note (Signed)
OPERATIVE REPORT  SURGEON: Rod Can, MD   ASSISTANT: Ky Barban, RNFA.  PREOPERATIVE DIAGNOSIS: Right knee arthritis.   POSTOPERATIVE DIAGNOSIS: Right knee arthritis.   PROCEDURE: Right total knee arthroplasty.   IMPLANTS: Stryker Triathlon CR femur, size 4. Stryker Tritanium tibia, size 5. X3 polyethelyene insert, size 11 mm, CS. 3 button asymmetric patella, size 32 mm.  ANESTHESIA:  Spinal  TOURNIQUET TIME: Not utilized.   ESTIMATED BLOOD LOSS:-250 mL    ANTIBIOTICS: 2 g Ancef.  DRAINS: None.  COMPLICATIONS: None   CONDITION: PACU - hemodynamically stable.   BRIEF CLINICAL NOTE: [name] [name] is a 71 y.o. female with a long-standing history of Right knee arthritis. After failing conservative management, the patient was indicated for total knee arthroplasty. The risks, benefits, and alternatives to the procedure were explained, and the patient elected to proceed.  PROCEDURE IN DETAIL: Adductor canal block was obtained in the pre-op holding area. Once inside the operative room, spinal anesthesia was obtained, and a foley catheter was inserted. The patient was then positioned, a nonsterile tourniquet was placed, and the lower extremity was prepped and draped in the normal sterile surgical fashion. A time-out was called verifying side and site of surgery. The patient received IV antibiotics within 60 minutes of beginning the procedure. The tourniquet was not utilized.  An anterior approach to the knee was performed utilizing a medial parapatellar arthrotomy. A medial release was performed and the patellar fat pad was excised. Stryker navigation was used to cut the distal femur perpendicular to the mechanical axis. A freehand patellar resection was performed, and the patella was sized an prepared with 3 lug holes.  Nagivation was used to make a neutral proximal tibia  resection, taking 8 mm of bone from the less affected lateral side with 3 degrees of slope. The menisci were excised. A spacer block was placed, and the alignment and balance in extension were confirmed.   The distal femur was sized using the 3-degree external rotation guide referencing the posterior femoral cortex. The appropriate 4-in-1 cutting block was pinned into place. Rotation was checked using Whiteside's line, the epicondylar axis, and then confirmed with a spacer block in flexion. The remaining femoral cuts were performed, taking care to protect the MCL.  The tibia was sized and the trial tray was pinned into place. The remaining trail components were inserted. The knee was stable to varus and valgus stress through a full range of motion. The patella tracked centrally, and the PCL was well balanced. The trial components were removed, and the proximal tibial surface was prepared. Final components were impacted into place. The knee was tested for a final time and found to be well balanced.  The wound was copiously irrigated with a dilute betadine solution followed by normal saline with pulse lavage. Marcaine solution was injected into the periarticular soft tissue. The wound was closed in layers using #1 Vicryl and Stratafix for the fascia, 2-0 Vicryl for the subcutaneous fat, 2-0 Monocryl for the deep dermal layer, 3-0 running Monocryl subcuticular Stitch, and Dermabond for the skin. Once the glue was fully dried, an Aquacell Ag and compressive dressing were applied. Tthe patient was transported to the recovery room in stable condition. Sponge, needle, and instrument counts were correct at the end of the case x2. The patient tolerated the procedure well and there were no known complications.

## 2017-01-26 NOTE — Discharge Instructions (Signed)
° °Dr. Katherine Syme °Total Joint Specialist °[facility] °[address] °[place], [place] [address] °[phone] ° °TOTAL KNEE REPLACEMENT POSTOPERATIVE DIRECTIONS ° ° ° °Knee Rehabilitation, Guidelines Following Surgery  °Results after knee surgery are often greatly improved when you follow the exercise, range of motion and muscle strengthening exercises prescribed by your doctor. Safety measures are also important to protect the knee from further injury. Any time any of these exercises cause you to have increased pain or swelling in your knee joint, decrease the amount until you are comfortable again and slowly increase them. If you have problems or questions, call your caregiver or physical therapist for advice.  ° °WEIGHT BEARING °Weight bearing as tolerated with assist device (walker, cane, etc) as directed, use it as long as suggested by your surgeon or therapist, typically at least 4-6 weeks. ° °HOME CARE INSTRUCTIONS  °Remove items at home which could result in a fall. This includes throw rugs or furniture in walking pathways.  °Continue medications as instructed at time of discharge. °You may have some home medications which will be placed on hold until you complete the course of blood thinner medication.  °You may start showering once you are discharged home but do not submerge the incision under water. Just pat the incision dry and apply a dry gauze dressing on daily. °Walk with walker as instructed.  °You may resume a sexual relationship in one month or when given the OK by your doctor.  °· Use walker as long as suggested by your caregivers. °· Avoid periods of inactivity such as sitting longer than an hour when not asleep. This helps prevent blood clots.  °You may put full weight on your legs and walk as much as is comfortable.  °You may return to work once you are cleared by your doctor.  °Do not drive a car for 6 weeks or until released by you surgeon.  °· Do not drive  while taking narcotics.  °Wear the elastic stockings for three weeks following surgery during the day but you may remove then at night. °Make sure you keep all of your appointments after your operation with all of your doctors and caregivers. You should call the office at the above phone number and make an appointment for approximately two weeks after the date of your surgery. °Do not remove your surgical dressing. The dressing is waterproof; you may take showers in 3 days, but do not take tub baths or submerge the dressing. °Please pick up a stool softener and laxative for home use as long as you are requiring pain medications. °· ICE to the affected knee every three hours for 30 minutes at a time and then as needed for pain and swelling.  Continue to use ice on the knee for pain and swelling from surgery. You may notice swelling that will progress down to the foot and ankle.  This is normal after surgery.  Elevate the leg when you are not up walking on it.   °It is important for you to complete the blood thinner medication as prescribed by your doctor. °· Continue to use the breathing machine which will help keep your temperature down.  It is common for your temperature to cycle up and down following surgery, especially at night when you are not up moving around and exerting yourself.  The breathing machine keeps your lungs expanded and your temperature down. ° °RANGE OF MOTION AND STRENGTHENING EXERCISES  °Rehabilitation of the knee is important following   a knee injury or an operation. After just a few days of immobilization, the muscles of the thigh which control the knee become weakened and shrink (atrophy). Knee exercises are designed to build up the tone and strength of the thigh muscles and to improve knee motion. Often times heat used for twenty to thirty minutes before working out will loosen up your tissues and help with improving the range of motion but do not use heat for the first two weeks following  surgery. These exercises can be done on a training (exercise) mat, on the floor, on a table or on a bed. Use what ever works the best and is most comfortable for you Knee exercises include:  °Leg Lifts - While your knee is still immobilized in a splint or cast, you can do straight leg raises. Lift the leg to 60 degrees, hold for 3 sec, and slowly lower the leg. Repeat 10-20 times 2-3 times daily. Perform this exercise against resistance later as your knee gets better.  °Quad and Hamstring Sets - Tighten up the muscle on the front of the thigh (Quad) and hold for 5-10 sec. Repeat this 10-20 times hourly. Hamstring sets are done by pushing the foot backward against an object and holding for 5-10 sec. Repeat as with quad sets.  °A rehabilitation program following serious knee injuries can speed recovery and prevent re-injury in the future due to weakened muscles. Contact your doctor or a physical therapist for more information on knee rehabilitation.  ° °SKILLED REHAB INSTRUCTIONS: °If the patient is transferred to a skilled rehab facility following release from the hospital, a list of the current medications will be sent to the facility for the patient to continue.  When discharged from the skilled rehab facility, please have the facility set up the patient's Home Health Physical Therapy prior to being released. Also, the skilled facility will be responsible for providing the patient with their medications at time of release from the facility to include their pain medication, the muscle relaxants, and their blood thinner medication. If the patient is still at the rehab facility at time of the two week follow up appointment, the skilled rehab facility will also need to assist the patient in arranging follow up appointment in our office and any transportation needs. ° °MAKE SURE YOU:  °Understand these instructions.  °Will watch your condition.  °Will get help right away if you are not doing well or get worse.   ° ° °Pick up stool softner and laxative for home use following surgery while on pain medications. °Do NOT remove your dressing. You may shower.  °Do not take tub baths or submerge incision under water. °May shower starting three days after surgery. °Please use a clean towel to pat the incision dry following showers. °Continue to use ice for pain and swelling after surgery. °Do not use any lotions or creams on the incision until instructed by your surgeon. ° °

## 2017-01-26 NOTE — Anesthesia Preprocedure Evaluation (Signed)
Anesthesia Evaluation  Patient identified by MRN, date of birth, ID band Patient awake    Reviewed: Allergy & Precautions, NPO status , Patient's Chart, lab work & pertinent test results  History of Anesthesia Complications (+) PONV and history of anesthetic complications  Airway Mallampati: II  TM Distance: >3 FB Neck ROM: Full    Dental no notable dental hx. (+) Dental Advisory Given   Pulmonary neg pulmonary ROS,    Pulmonary exam normal breath sounds clear to auscultation       Cardiovascular hypertension, + CAD, + Peripheral Vascular Disease and +CHF  Normal cardiovascular exam+ Valvular Problems/Murmurs  Rhythm:Regular Rate:Normal     Neuro/Psych negative neurological ROS  negative psych ROS   GI/Hepatic Neg liver ROS, GERD  ,  Endo/Other  negative endocrine ROSdiabetes  Renal/GU negative Renal ROS  negative genitourinary   Musculoskeletal  (+) Arthritis ,   Abdominal   Peds negative pediatric ROS (+)  Hematology negative hematology ROS (+)   Anesthesia Other Findings EKG 07/01/16: NSR, LAD, LVH with QRS widening, cannot rule out septal infarct (age undetermined).  Echo 07/18/15: Study Conclusions - Left ventricle: The cavity size was normal. Wall thickness was normal. Systolic function was normal. The estimated ejection fraction was in the range of 50% to 55%. Wall motion was normal; there were no regional wall motion abnormalities. Doppler parameters are consistent with abnormal left ventricular relaxation (grade 1 diastolic dysfunction). - Aortic valve: A bioprosthesis was present. There was trivial regurgitation. - Mitral valve: Mildly calcified annulus.  RHC/LHC (PRE-BENTALL/CABG) 10/05/13: Final Conclusions:  1. Severe mid LAD stenosis with otherwise minor nonobstructive CAD 2. Mild global LV systolic dysfunction, EF 78% 3. Dilated aortic root with severe aortic valve  insufficiency 4. Normal intracardiac hemodynamics  Reproductive/Obstetrics negative OB ROS                             Anesthesia Physical  Anesthesia Plan  ASA: III  Anesthesia Plan: Spinal   Post-op Pain Management:  Regional for Post-op pain   Induction: Intravenous  PONV Risk Score and Plan: 3 and Ondansetron, Dexamethasone, Midazolam, Treatment may vary due to age or medical condition and Propofol infusion  Airway Management Planned: Nasal Cannula, Natural Airway and Mask  Additional Equipment:   Intra-op Plan:   Post-operative Plan:   Informed Consent: I have reviewed the patients History and Physical, chart, labs and discussed the procedure including the risks, benefits and alternatives for the proposed anesthesia with the patient or authorized representative who has indicated his/her understanding and acceptance.   Dental advisory given  Plan Discussed with: CRNA  Anesthesia Plan Comments:         Anesthesia Quick Evaluation

## 2017-01-26 NOTE — H&P (View-Only) (Signed)
TOTAL KNEE ADMISSION H&P  Patient is being admitted for right total knee arthroplasty.  Subjective:  Chief Complaint:right knee pain.  HPI: [name], 71 y.o. female, has a history of pain and functional disability in the right knee due to arthritis and has failed non-surgical conservative treatments for greater than 12 weeks to includeNSAID's and/or analgesics, corticosteriod injections, flexibility and strengthening excercises, use of assistive devices, weight reduction as appropriate and activity modification.  Onset of symptoms was gradual, starting >10 years ago with gradually worsening course since that time. The patient noted no past surgery on the right knee(s).  Patient currently rates pain in the right knee(s) at 10 out of 10 with activity. Patient has night pain, worsening of pain with activity and weight bearing, pain that interferes with activities of daily living, pain with passive range of motion, crepitus and joint swelling.  Patient has evidence of subchondral cysts, subchondral sclerosis, periarticular osteophytes, joint subluxation and joint space narrowing by imaging studies. There is no active infection.  Patient Active Problem List   Diagnosis Date Noted  . Leg cramps 07/02/2015  . Dyslipidemia, goal LDL below 70 10/21/2014  . S/P Bio-Bentall aortic root replacement with bioprosthetic valve conduit 10/27/2013  . S/P CABG x 1 10/27/2013  . Aortic insufficiency 10/10/2013  . Chronic diastolic CHF (congestive heart failure), NYHA class 2 ([place])   . Coronary artery disease 10/05/2013  . PVC (premature ventricular contraction) 03/30/2013  . Essential hypertension, benign 03/30/2013   Past Medical History:  Diagnosis Date  . Adenomatous colon polyp   . Aortic regurgitation    severe by TEE s/p pericardia tissue AVR  . Arthritis   . Cardiomyopathy ([place])    EF initally 25% but now normalized  . Chronic diastolic CHF (congestive heart failure), NYHA class 2 ([place])   .  Coronary artery disease 10/05/2013   Severe mid LAD stenosis s/p 1 vessel CABG with LIMA to LAD  . Diabetes mellitus without complication ([place])    DX 20 YRS AGO.   TYPE 2  . Dilated aortic root ([place])   . GERD (gastroesophageal reflux disease)    Tums prn  . Gout   . Gout   . Heart murmur   . Hypercholesteremia    LDL goal < 70  . Hypertension   . S/P Bio-Bentall aortic root replacement with bioprosthetic valve conduit 10/27/2013   23 mm [facility] Ease bovine pericardial tissue valve and 26 mm Vascutek gelweave Val-[place]va aortic graft  . S/P CABG x 1 10/27/2013   LIMA to LAD  . Thyroiditis 10/2009   lab and u/s-thyroid function normalized in 9/11    Past Surgical History:  Procedure Laterality Date  . ABDOMINAL HYSTERECTOMY    . AORTIC VALVE REPLACEMENT N/A 10/27/2013   Procedure: AORTIC VALVE REPLACEMENT (AVR);  Surgeon: Rexene Alberts, MD;  Location: [place];  Service: Open Heart Surgery;  Laterality: N/A;  . ASCENDING AORTIC ROOT REPLACEMENT N/A 10/27/2013   Procedure: ASCENDING AORTIC ROOT REPLACEMENT;  Surgeon: Rexene Alberts, MD;  Location: [place];  Service: Open Heart Surgery;  Laterality: N/A;  . BUNIONECTOMY     bilateral  . CARDIAC CATHETERIZATION    . CARDIAC VALVE REPLACEMENT     AORTIC VALVE  . CARPAL TUNNEL RELEASE Right   . CATARACT EXTRACTION Bilateral   . COLONOSCOPY    . COLONOSCOPY WITH PROPOFOL N/A 05/08/2015   Procedure: COLONOSCOPY WITH PROPOFOL;  Surgeon: Garlan Fair, MD;  Location: [facility];  Service:  Endoscopy;  Laterality: N/A;  . CORONARY ARTERY BYPASS GRAFT N/A 10/27/2013   Procedure: CORONARY ARTERY BYPASS GRAFTING (CABG) x 1 - LIMA to LAD;  Surgeon: Rexene Alberts, MD;  Location: [facility];  Service: Open Heart Surgery;  Laterality: N/A;  . EYE SURGERY     BILATERAL CATARACTS  . INTRAOPERATIVE TRANSESOPHAGEAL ECHOCARDIOGRAM N/A 10/27/2013   Procedure: INTRAOPERATIVE TRANSESOPHAGEAL ECHOCARDIOGRAM;  Surgeon: Rexene Alberts, MD;  Location: [facility];   Service: Open Heart Surgery;  Laterality: N/A;  . LEFT AND RIGHT HEART CATHETERIZATION WITH CORONARY ANGIOGRAM N/A 10/05/2013   Procedure: LEFT AND RIGHT HEART CATHETERIZATION WITH CORONARY ANGIOGRAM;  Surgeon: Blane Ohara, MD;  [facility];  Service: Cardiovascular;  Laterality: N/A;  . TEE WITHOUT CARDIOVERSION N/A 10/04/2013   Procedure: TRANSESOPHAGEAL ECHOCARDIOGRAM (TEE);  Surgeon: Sueanne Margarita, MD;  Location: [facility] ENDOSCOPY;  Service: Cardiovascular;  Laterality: N/A;  . tubular adenomatous polyp colonoscopy  12/31/2009    Current Outpatient Prescriptions  Medication Sig Dispense Refill Last Dose  . acetaminophen (TYLENOL) 650 MG CR tablet Take 1,300 mg by mouth at bedtime as needed for pain.     Marland Kitchen aspirin EC 81 MG tablet Take 81 mg by mouth daily.   Taking  . atorvastatin (LIPITOR) 10 MG tablet TAKE 1 TABLET BY MOUTH EVERY DAY AT 6PM 90 tablet 2   . Calcium-Vitamin D (CALTRATE 600 PLUS-VIT D PO) Take 1 tablet by mouth daily.   Taking  . carvedilol (COREG) 6.25 MG tablet TAKE 1 TABLET BY MOUTH TWICE A DAY WITH A MEAL 180 tablet 2   . clindamycin (CLEOCIN) 150 MG capsule Take 4 capsules by mouth as needed. (Take one (1) hour prior to dental procedures)  2 Taking  . colchicine (COLCRYS) 0.6 MG tablet Take 0.6 mg by mouth daily.   Taking  . famotidine (PEPCID) 10 MG tablet Take 10 mg by mouth daily as needed for heartburn.    Taking  . GLUCOSAMINE-CHONDROITIN PO Take 1 tablet by mouth daily. Glucosamine 1200 mg, chondroitin 1500 mg   Taking  . losartan (COZAAR) 25 MG tablet TAKE 1 TABLET BY MOUTH EVERY DAY 90 tablet 2   . metFORMIN (GLUCOPHAGE-XR) 500 MG 24 hr tablet Take 1,000 mg by mouth at bedtime.    Taking  . Multiple Vitamin (MULTIVITAMIN WITH MINERALS) TABS tablet Take 1 tablet by mouth daily. Centrum Silver   Taking  . pramipexole (MIRAPEX) 0.125 MG tablet Take 0.125 mg by mouth at bedtime.  6    No current facility-administered medications for this visit.     Facility-Administered Medications Ordered in Other Visits  Medication Dose Route Frequency Provider Last Rate Last Dose  . [START ON 01/26/2017] tranexamic acid (CYKLOKAPRON) 1,000 mg in sodium chloride 0.9 % 100 mL IVPB  1,000 mg Intravenous To SS-Surg Cortavious Nix, Aaron Edelman, MD       Allergies  Allergen Reactions  . Atenolol Shortness Of Breath and Other (See Comments)    Edema and Headache  . Lisinopril Cough  . Penicillins Other (See Comments)    Has patient had a PCN reaction causing immediate rash, facial/tongue/throat swelling, SOB or lightheadedness with hypotension: No Has patient had a PCN reaction causing severe rash involving mucus membranes or skin necrosis: No Has patient had a PCN reaction that required hospitalization No Has patient had a PCN reaction occurring within the last 10 years: No If all of the above answers are "NO", then may proceed with Cephalosporin use.G Benzathine: Local injection rash  .  Sulfa Antibiotics Hives and Rash    Social History  Substance Use Topics  . Smoking status: Never Smoker  . Smokeless tobacco: Never Used  . Alcohol use No    Family History  Problem Relation Age of Onset  . Hypertension Mother   . Diabetes Mother   . Arthritis/Rheumatoid Mother   . Heart attack Father   . Stroke Sister   . Cancer Brother      Review of Systems  Constitutional: Negative.   HENT: Negative.   Eyes: Negative.   Respiratory: Negative.   Cardiovascular: Negative.   Gastrointestinal: Negative.   Genitourinary: Negative.   Musculoskeletal: Positive for joint pain.  Skin: Negative.   Neurological: Negative.   Endo/Heme/Allergies: Negative.   Psychiatric/Behavioral: Negative.     Objective:  Physical Exam  Vitals reviewed. Constitutional: She is oriented to person, place, and time. She appears well-developed and well-nourished.  HENT:  Head: Normocephalic.  Left Ear: External ear normal.  Eyes: Pupils are equal, round, and reactive to light.  Conjunctivae and EOM are normal.  Neck: Normal range of motion. Neck supple.  Cardiovascular: Normal rate, regular rhythm and intact distal pulses.   Respiratory: Effort normal. No respiratory distress.  GI: Soft. She exhibits no distension.  Genitourinary:  Genitourinary Comments: deferred  Musculoskeletal:       Right knee: She exhibits swelling, effusion, deformity, abnormal alignment and bony tenderness. Tenderness found. Medial joint line and lateral joint line tenderness noted.  Neurological: She is alert and oriented to person, place, and time. She has normal reflexes.  Skin: Skin is warm and dry.  Psychiatric: She has a normal mood and affect. Her behavior is normal. Judgment and thought content normal.    Vital signs in last 24 hours: @VSRANGES @  Labs:   Estimated body mass index is 32.77 kg/m as calculated from the following:   Height as of 01/19/17: 5' 0.25" (1.53 m).   Weight as of 01/19/17: 76.7 kg (169 lb 3.2 oz).   Imaging Review Plain radiographs demonstrate severe degenerative joint disease of the right knee(s). The overall alignment issignificant varus. The bone quality appears to be adequate for age and reported activity level.  Assessment/Plan:  End stage arthritis, right knee   The patient history, physical examination, clinical judgment of the provider and imaging studies are consistent with end stage degenerative joint disease of the right knee(s) and total knee arthroplasty is deemed medically necessary. The treatment options including medical management, injection therapy arthroscopy and arthroplasty were discussed at length. The risks and benefits of total knee arthroplasty were presented and reviewed. The risks due to aseptic loosening, infection, stiffness, patella tracking problems, thromboembolic complications and other imponderables were discussed. The patient acknowledged the explanation, agreed to proceed with the plan and consent was signed. Patient  is being admitted for inpatient treatment for surgery, pain control, PT, OT, prophylactic antibiotics, VTE prophylaxis, progressive ambulation and ADL's and discharge planning. The patient is planning to be discharged home with outpatient PT

## 2017-01-26 NOTE — Anesthesia Procedure Notes (Signed)
Anesthesia Regional Block: Adductor canal block   Pre-Anesthetic Checklist: ,, timeout performed, Correct Patient, Correct Site, Correct Laterality, Correct Procedure, Correct Position, site marked, Risks and benefits discussed,  Surgical consent,  Pre-op evaluation,  At surgeon's request and post-op pain management  Laterality: Right  Prep: chloraprep       Needles:  Injection technique: Single-shot  Needle Type: Echogenic Stimulator Needle     Needle Length: 5cm  Needle Gauge: 22     Additional Needles:   Procedures:, nerve stimulator,,, ultrasound used (permanent image in chart),,,,  Narrative:  Start time: 01/26/2017 1:18 PM End time: 01/26/2017 1:22 PM Injection made incrementally with aspirations every 5 mL.  Performed by: Personally  Anesthesiologist: Amaria Mundorf  Additional Notes: Functioning IV was confirmed and monitors were applied.  A 15mm 22ga Arrow echogenic stimulator needle was used. Sterile prep and drape,hand hygiene and sterile gloves were used. Ultrasound guidance: relevant anatomy identified, needle position confirmed, local anesthetic spread visualized around nerve(s)., vascular puncture avoided.  Image printed for medical record. Negative aspiration and negative test dose prior to incremental administration of local anesthetic. The patient tolerated the procedure well.

## 2017-01-27 ENCOUNTER — Encounter (HOSPITAL_COMMUNITY): Payer: Self-pay | Admitting: Orthopedic Surgery

## 2017-01-27 LAB — CBC
HCT: 34.3 % — ABNORMAL LOW (ref 36.0–46.0)
Hemoglobin: 11.9 g/dL — ABNORMAL LOW (ref 12.0–15.0)
MCH: 33.1 pg (ref 26.0–34.0)
MCHC: 34.7 g/dL (ref 30.0–36.0)
MCV: 95.3 fL (ref 78.0–100.0)
Platelets: 166 10*3/uL (ref 150–400)
RBC: 3.6 MIL/uL — ABNORMAL LOW (ref 3.87–5.11)
RDW: 12.4 % (ref 11.5–15.5)
WBC: 14.7 10*3/uL — ABNORMAL HIGH (ref 4.0–10.5)

## 2017-01-27 LAB — BASIC METABOLIC PANEL
Anion gap: 11 (ref 5–15)
BUN: 16 mg/dL (ref 6–20)
CO2: 22 mmol/L (ref 22–32)
Calcium: 8.6 mg/dL — ABNORMAL LOW (ref 8.9–10.3)
Chloride: 100 mmol/L — ABNORMAL LOW (ref 101–111)
Creatinine, Ser: 0.94 mg/dL (ref 0.44–1.00)
GFR calc Af Amer: >60 mL/min (ref >60)
GFR calc non Af Amer: 59 mL/min — ABNORMAL LOW (ref >60)
Glucose, Bld: 205 mg/dL — ABNORMAL HIGH (ref 65–99)
Potassium: 4 mmol/L (ref 3.5–5.1)
Sodium: 133 mmol/L — ABNORMAL LOW (ref 135–145)

## 2017-01-27 LAB — GLUCOSE, CAPILLARY
Glucose-Capillary: 203 mg/dL — ABNORMAL HIGH (ref 65–99)
Glucose-Capillary: 164 mg/dL — ABNORMAL HIGH (ref 65–99)
Glucose-Capillary: 339 mg/dL — ABNORMAL HIGH (ref 65–99)
Glucose-Capillary: 227 mg/dL — ABNORMAL HIGH (ref 65–99)

## 2017-01-27 MED ORDER — ASPIRIN 81 MG PO CHEW: 81.0000 mg | CHEWABLE_TABLET | Freq: Two times a day (BID) | ORAL | 1 refills | Status: DC

## 2017-01-27 MED ORDER — INSULIN ASPART 100 UNIT/ML ~~LOC~~ SOLN
0.0000 [IU] | Freq: Every day | SUBCUTANEOUS | Status: DC
  Administered 2017-01-27: 2 [IU] via SUBCUTANEOUS
  Administered 2017-01-27: 4 [IU] via SUBCUTANEOUS

## 2017-01-27 MED ORDER — ONDANSETRON HCL 4 MG PO TABS: 4.0000 mg | ORAL_TABLET | Freq: Four times a day (QID) | ORAL | 0 refills | Status: DC | PRN

## 2017-01-27 MED ORDER — SENNA 8.6 MG PO TABS: 2.0000 | ORAL_TABLET | Freq: Every day | ORAL | 0 refills | Status: DC

## 2017-01-27 MED ORDER — HYDROCODONE-ACETAMINOPHEN 5-325 MG PO TABS: 1.0000 | ORAL_TABLET | Freq: Four times a day (QID) | ORAL | 0 refills | Status: DC | PRN

## 2017-01-27 MED ORDER — INSULIN ASPART 100 UNIT/ML ~~LOC~~ SOLN
0.0000 [IU] | Freq: Three times a day (TID) | SUBCUTANEOUS | Status: DC
  Administered 2017-01-27: 11 [IU] via SUBCUTANEOUS
  Administered 2017-01-27: 3 [IU] via SUBCUTANEOUS
  Administered 2017-01-27: 5 [IU] via SUBCUTANEOUS
  Administered 2017-01-28: 3 [IU] via SUBCUTANEOUS
  Administered 2017-01-28: 2 [IU] via SUBCUTANEOUS

## 2017-01-27 MED ORDER — DOCUSATE SODIUM 100 MG PO CAPS: 100.0000 mg | ORAL_CAPSULE | Freq: Two times a day (BID) | ORAL | 1 refills | Status: DC

## 2017-01-27 NOTE — Progress Notes (Signed)
Physical Therapy Treatment Patient Details Name: [name] [name] MRN: [id number] DOB: 1945-04-21 Today's Date: 01/27/2017    History of Present Illness 71 yo admitted for Right TKA. PMhx: CABG, CAD, CHF, HTN    PT Comments    Pt with increased gait, activity tolerance and HEP performed. Pt able to tolerate activity without change in pain and educated for heel elevation at rest. Will continue to follow.    Follow Up Recommendations  DC plan and follow up therapy as arranged by surgeon     Equipment Recommendations  None recommended by PT    Recommendations for Other Services       Precautions / Restrictions Precautions Precautions: Knee Restrictions Weight Bearing Restrictions: Yes RLE Weight Bearing: Weight bearing as tolerated    Mobility  Bed Mobility Overal bed mobility: Modified Independent             General bed mobility comments: increased time for sit to supine  Transfers Overall transfer level: Needs assistance   Transfers: Sit to/from Stand Sit to Stand: Supervision         General transfer comment: cues for hand placement   Ambulation/Gait Ambulation/Gait assistance: Min guard Ambulation Distance (Feet): 230 Feet Assistive device: Rolling walker (2 wheeled) Gait Pattern/deviations: Step-through pattern;Decreased stride length;Trunk flexed   Gait velocity interpretation: Below normal speed for age/gender General Gait Details: cues for posture, looking up, sequence, shoulder depression   Stairs            Wheelchair Mobility    Modified Rankin (Stroke Patients Only)       Balance Overall balance assessment: Needs assistance   Sitting balance-Leahy Scale: Normal       Standing balance-Leahy Scale: Fair                              Cognition Arousal/Alertness: Awake/alert Behavior During Therapy: WFL for tasks assessed/performed Overall Cognitive Status: Within Functional Limits for tasks assessed                                         Exercises Total Joint Exercises Heel Slides: Right;Supine;10 reps;AROM  Straight Leg Raises: AROM;Right;Supine;10 reps Long Arc Quad: AROM;Right;15 reps;Seated Goniometric ROM: 7-62    General Comments        Pertinent Vitals/Pain Pain Score: 4  Pain Location: right  knee Pain Descriptors / Indicators: Aching Pain Intervention(s): Limited activity within patient's tolerance;Repositioned;Premedicated before session    Home Living                      Prior Function            PT Goals (current goals can now be found in the care plan section) Progress towards PT goals: Progressing toward goals    Frequency    7X/week      PT Plan Current plan remains appropriate    Co-evaluation              AM-PAC PT "6 Clicks" Daily Activity  Outcome Measure  Difficulty turning over in bed (including adjusting bedclothes, sheets and blankets)?: A Little Difficulty moving from lying on back to sitting on the side of the bed? : A Little Difficulty sitting down on and standing up from a chair with arms (e.g., wheelchair, bedside commode, etc,.)?: A Little Help needed moving to and  from a bed to chair (including a wheelchair)?: A Little Help needed walking in hospital room?: A Little Help needed climbing 3-5 steps with a railing? : A Little 6 Click Score: 18    End of Session Equipment Utilized During Treatment: Gait belt Activity Tolerance: Patient tolerated treatment well Patient left: in bed;with call bell/phone within reach;with family/visitor present Nurse Communication: Mobility status;Precautions PT Visit Diagnosis: Difficulty in walking, not elsewhere classified (R26.2);Muscle weakness (generalized) (M62.81);Pain Pain - Right/Left: Right Pain - part of body: Knee     Time: 1340-1413 PT Time Calculation (min) (ACUTE ONLY): 33 min  Charges:  $Gait Training: 8-22 mins $Therapeutic Exercise: 8-22 mins                     G Codes:       Elwyn Reach, PT [phone]    Long 01/27/2017, 2:18 PM

## 2017-01-27 NOTE — Evaluation (Signed)
Physical Therapy Evaluation Patient Details Name: [name] [name] MRN: [id number] DOB: 07-27-1945 Today's Date: 01/27/2017   History of Present Illness  71 yo admitted for Right TKA. PMhx: CABG, CAD, CHF, HTN  Clinical Impression  Pt very pleasant and in chair on arrival. Pt educated for knee precautions as she was in chair with pillow under knee on arrival. Pt educated for transfers, HEP, function, progression and plan with right heel elevated with pillow end of session. Pt with decreased strength, ROm, transfers and gait who will benefit from acute therapy to maximize mobility, function and strength to return to Independent level.     Follow Up Recommendations DC plan and follow up therapy as arranged by surgeon (pt requesting HHPT initially)    Equipment Recommendations  None recommended by PT    Recommendations for Other Services       Precautions / Restrictions Precautions Precautions: Knee Restrictions Weight Bearing Restrictions: Yes RLE Weight Bearing: Weight bearing as tolerated      Mobility  Bed Mobility Overal bed mobility: Modified Independent             General bed mobility comments: increased time for sit to supine  Transfers Overall transfer level: Needs assistance   Transfers: Sit to/from Stand Sit to Stand: Min guard         General transfer comment: cues for hand placement and safety from chair, toilet and to bed  Ambulation/Gait Ambulation/Gait assistance: Min guard Ambulation Distance (Feet): 80 Feet Assistive device: Rolling walker (2 wheeled) Gait Pattern/deviations: Step-to pattern   Gait velocity interpretation: Below normal speed for age/gender General Gait Details: cues for posture, looking up, sequence for step through with pt not able to complete yet and heel strike on RLE   Stairs            Wheelchair Mobility    Modified Rankin (Stroke Patients Only)       Balance Overall balance assessment: Needs assistance    Sitting balance-Leahy Scale: Normal       Standing balance-Leahy Scale: Fair                               Pertinent Vitals/Pain Pain Assessment: 0-10 Pain Score: 4  Pain Location: right  knee Pain Descriptors / Indicators: Aching Pain Intervention(s): RN gave pain meds during session;Repositioned;Ice applied;Limited activity within patient's tolerance;Monitored during session    [name] expects to be discharged to:: Private residence Living Arrangements: Spouse/significant other Available Help at Discharge: Available 24 hours/day Type of Home: House Home Access: Stairs to enter   Technical brewer of Steps: 1 Home Layout: One level Home Equipment: Bedside commode;Walker - 2 wheels      Prior Function Level of Independence: Independent               Hand Dominance        Extremity/Trunk Assessment   Upper Extremity Assessment Upper Extremity Assessment: Overall WFL for tasks assessed    Lower Extremity Assessment Lower Extremity Assessment: RLE deficits/detail RLE Deficits / Details: decreased strength and ROm post op    Cervical / Trunk Assessment Cervical / Trunk Assessment:  (rounded shoulders, forward head)  Communication   Communication: No difficulties  Cognition Arousal/Alertness: Awake/alert Behavior During Therapy: WFL for tasks assessed/performed Overall Cognitive Status: Within Functional Limits for tasks assessed  General Comments      Exercises Total Joint Exercises Heel Slides: AAROM;Right;Supine;10 reps Hip ABduction/ADduction: AROM;Right;Supine;10 reps Straight Leg Raises: AROM;Right;Supine;10 reps Long Arc Quad: AROM;Right;Supine;10 reps Marching in Standing: AROM;Right;Supine;10 reps   Assessment/Plan    PT Assessment Patient needs continued PT services  PT Problem List Decreased strength;Decreased mobility;Decreased safety  awareness;Decreased range of motion;Decreased activity tolerance;Decreased knowledge of use of DME;Pain       PT Treatment Interventions DME instruction;Therapeutic activities;Gait training;Therapeutic exercise;Patient/family education;Functional mobility training;Stair training    PT Goals (Current goals can be found in the Care Plan section)  Acute Rehab PT Goals Patient Stated Goal: walk without pain PT Goal Formulation: With patient Time For Goal Achievement: 02/03/17 Potential to Achieve Goals: Good    Frequency 7X/week   Barriers to discharge        Co-evaluation               AM-PAC PT "6 Clicks" Daily Activity  Outcome Measure Difficulty turning over in bed (including adjusting bedclothes, sheets and blankets)?: A Little Difficulty moving from lying on back to sitting on the side of the bed? : A Little Difficulty sitting down on and standing up from a chair with arms (e.g., wheelchair, bedside commode, etc,.)?: A Little Help needed moving to and from a bed to chair (including a wheelchair)?: A Little Help needed walking in hospital room?: A Little Help needed climbing 3-5 steps with a railing? : A Little 6 Click Score: 18    End of Session Equipment Utilized During Treatment: Gait belt Activity Tolerance: Patient tolerated treatment well Patient left: in bed;with call bell/phone within reach;with family/visitor present Nurse Communication: Mobility status;Precautions;Weight bearing status PT Visit Diagnosis: Difficulty in walking, not elsewhere classified (R26.2);Muscle weakness (generalized) (M62.81);Pain Pain - Right/Left: Right Pain - part of body: Knee    Time: 1856-3149 PT Time Calculation (min) (ACUTE ONLY): 39 min   Charges:   PT Evaluation $PT Eval Moderate Complexity: 1 Mod PT Treatments $Gait Training: 8-22 mins $Therapeutic Exercise: 8-22 mins   PT G Codes:        Elwyn Reach, PT [phone]   [name] [name] 01/27/2017, 10:33  AM

## 2017-01-27 NOTE — Discharge Summary (Signed)
Physician Discharge Summary  Patient ID: [name] [name] MRN: [id number] DOB/AGE: 73-Aug-1947 73 y.o.  Admit date: 01/26/2017 Discharge date: 01/28/2017  Admission Diagnoses:  Osteoarthritis of right knee  Discharge Diagnoses:  Principal Problem:   Osteoarthritis of right knee   Past Medical History:  Diagnosis Date  . Adenomatous colon polyp   . Aortic regurgitation    severe by TEE s/p pericardia tissue AVR  . Arthritis   . Cardiomyopathy ([place])    EF initally 25% but now normalized  . Chronic diastolic CHF (congestive heart failure), NYHA class 2 ([place])   . Coronary artery disease 10/05/2013   Severe mid LAD stenosis s/p 1 vessel CABG with LIMA to LAD  . Diabetes mellitus without complication ([place])    DX 20 YRS AGO.   TYPE 2  . Dilated aortic root ([place])   . GERD (gastroesophageal reflux disease)    Tums prn  . Gout   . Gout   . Heart murmur   . Hypercholesteremia    LDL goal < 70  . Hypertension   . S/P Bio-Bentall aortic root replacement with bioprosthetic valve conduit 10/27/2013   23 mm [facility] Ease bovine pericardial tissue valve and 26 mm Vascutek gelweave Val-Salva aortic graft  . S/P CABG x 1 10/27/2013   LIMA to LAD  . Thyroiditis 10/2009   lab and u/s-thyroid function normalized in 9/11    Surgeries: Procedure(s): RIGHT TOTAL KNEE ARTHROPLASTY WITH COMPUTER NAVIGATION on 01/26/2017   Consultants (if any):   Discharged Condition: Improved  Hospital Course: [name] [name] is an 71 y.o. female who was admitted 01/26/2017 with a diagnosis of Osteoarthritis of right knee and went to the operating room on 01/26/2017 and underwent the above named procedures.    She was given perioperative antibiotics:  Anti-infectives    Start     Dose/Rate Route Frequency Ordered Stop   01/26/17 1930  clindamycin (CLEOCIN) IVPB 600 mg     600 mg 100 mL/hr over 30 Minutes Intravenous Every 6 hours 01/26/17 1631 01/27/17 0106   01/26/17 1140  clindamycin (CLEOCIN) 900  MG/50ML IVPB    Comments:  Grace Blight   : cabinet override      01/26/17 1140 01/26/17 1335   01/26/17 1131  clindamycin (CLEOCIN) IVPB 900 mg     900 mg 100 mL/hr over 30 Minutes Intravenous On call to O.R. 01/26/17 1131 01/26/17 1335    .  She was given sequential compression devices, early ambulation, and ASA for DVT prophylaxis.  She benefited maximally from the hospital stay and there were no complications.    Recent vital signs:  Vitals:   01/27/17 2050 01/28/17 0414  BP: (!) 131/57 (!) 143/63  Pulse: 65 61  Resp: 15 18  Temp: 98.2 F (36.8 C) 97.9 F (36.6 C)  SpO2: 99% 100%    Recent laboratory studies:  Lab Results  Component Value Date   HGB 11.1 (L) 01/28/2017   HGB 11.9 (L) 01/27/2017   HGB 14.1 01/19/2017   Lab Results  Component Value Date   WBC 14.1 (H) 01/28/2017   PLT 149 (L) 01/28/2017   Lab Results  Component Value Date   INR 1.49 10/27/2013   Lab Results  Component Value Date   NA 133 (L) 01/27/2017   K 4.0 01/27/2017   CL 100 (L) 01/27/2017   CO2 22 01/27/2017   BUN 16 01/27/2017   CREATININE 0.94 01/27/2017   GLUCOSE 205 (H) 01/27/2017    Discharge  Medications:   Allergies as of 01/28/2017      Reactions   Atenolol Shortness Of Breath, Other (See Comments)   Edema and Headache   Lisinopril Cough   Penicillins Other (See Comments)   Has patient had a PCN reaction causing immediate rash, facial/tongue/throat swelling, SOB or lightheadedness with hypotension: No Has patient had a PCN reaction causing severe rash involving mucus membranes or skin necrosis: No Has patient had a PCN reaction that required hospitalization No Has patient had a PCN reaction occurring within the last 10 years: No If all of the above answers are "NO", then may proceed with Cephalosporin use.G Benzathine: Local injection rash   Sulfa Antibiotics Hives, Rash      Medication List    STOP taking these medications   acetaminophen 650 MG CR  tablet Commonly known as:  TYLENOL   aspirin EC 81 MG tablet Replaced by:  aspirin 81 MG chewable tablet     TAKE these medications   aspirin 81 MG chewable tablet Chew 1 tablet (81 mg total) by mouth 2 (two) times daily. Replaces:  aspirin EC 81 MG tablet   atorvastatin 10 MG tablet Commonly known as:  LIPITOR TAKE 1 TABLET BY MOUTH EVERY DAY AT 6PM   CALTRATE 600 PLUS-VIT D PO Take 1 tablet by mouth daily.   carvedilol 6.25 MG tablet Commonly known as:  COREG TAKE 1 TABLET BY MOUTH TWICE A DAY WITH A MEAL   clindamycin 150 MG capsule Commonly known as:  CLEOCIN Take 4 capsules by mouth as needed. (Take one (1) hour prior to dental procedures)   COLCRYS 0.6 MG tablet Generic drug:  colchicine Take 0.6 mg by mouth daily.   docusate sodium 100 MG capsule Commonly known as:  COLACE Take 1 capsule (100 mg total) by mouth 2 (two) times daily.   famotidine 10 MG tablet Commonly known as:  PEPCID Take 10 mg by mouth daily as needed for heartburn.   GLUCOSAMINE-CHONDROITIN PO Take 1 tablet by mouth daily. Glucosamine 1200 mg, chondroitin 1500 mg   HYDROcodone-acetaminophen 5-325 MG tablet Commonly known as:  NORCO/VICODIN Take 1-2 tablets by mouth every 6 (six) hours as needed (breakthrough pain).   losartan 25 MG tablet Commonly known as:  COZAAR TAKE 1 TABLET BY MOUTH EVERY DAY   metFORMIN 500 MG 24 hr tablet Commonly known as:  GLUCOPHAGE-XR Take 1,000 mg by mouth at bedtime.   multivitamin with minerals Tabs tablet Take 1 tablet by mouth daily. Centrum Silver   ondansetron 4 MG tablet Commonly known as:  ZOFRAN Take 1 tablet (4 mg total) by mouth every 6 (six) hours as needed for nausea.   pramipexole 0.125 MG tablet Commonly known as:  MIRAPEX Take 0.125 mg by mouth at bedtime.   senna 8.6 MG Tabs tablet Commonly known as:  SENOKOT Take 2 tablets (17.2 mg total) by mouth at bedtime.            Durable Medical Equipment        Start      Ordered   01/26/17 1824  DME Walker rolling  Once    Question:  Patient needs a walker to treat with the following condition  Answer:  S/P total knee arthroplasty, right   01/26/17 1823   01/26/17 1824  DME 3 n 1  Once     01/26/17 1823      Diagnostic Studies: Dg Knee Right Port  Result Date: 01/26/2017 CLINICAL DATA:  Right knee replacement. EXAM:  PORTABLE RIGHT KNEE - 1-2 VIEW COMPARISON:  None. FINDINGS: Right total knee arthroplasty has been performed. There is gas in the soft tissues as expected. The knee arthroplasty is located with normal alignment. IMPRESSION: Right knee arthroplasty with expected postoperative changes. Electronically Signed   By: Markus Daft M.D.   On: 01/26/2017 16:44    Disposition: 01-Home or Self Care  Discharge Instructions    Call MD / Call 911    Complete by:  As directed    If you experience chest pain or shortness of breath, CALL 911 and be transported to the hospital emergency room.  If you develope a fever above 101 F, pus (white drainage) or increased drainage or redness at the wound, or calf pain, call your surgeon's office.   Constipation Prevention    Complete by:  As directed    Drink plenty of fluids.  Prune juice may be helpful.  You may use a stool softener, such as Colace (over the counter) 100 mg twice a day.  Use MiraLax (over the counter) for constipation as needed.   Diet - low sodium heart healthy    Complete by:  As directed    Do not put a pillow under the knee. Place it under the heel.    Complete by:  As directed    Driving restrictions    Complete by:  As directed    No driving for 6 weeks   Increase activity slowly as tolerated    Complete by:  As directed    Lifting restrictions    Complete by:  As directed    No lifting for 6 weeks   TED hose    Complete by:  As directed    Use stockings (TED hose) for 2 weeks on both leg(s).  You may remove them at night for sleeping.      Follow-up Information    Ernestine Rohman, Aaron Edelman,  MD. Schedule an appointment as soon as possible for a visit in 2 weeks.   Specialty:  Orthopedic Surgery Why:  For wound re-check Contact information: [address]. [address] [address] [phone]            Signed: Elie Goody 01/28/2017, 1:18 PM

## 2017-01-27 NOTE — Progress Notes (Signed)
   Subjective:  Patient reports pain as mild to moderate.  Denies N/V/CP/SOB.  Objective:   VITALS:   Vitals:   01/26/17 1809 01/26/17 1826 01/26/17 2033 01/27/17 0617  BP: (!) 152/63 (!) 149/65 134/61 (!) 122/56  Pulse: (!) 59 60 68 67  Resp: 14  16 16   Temp: 97.8 F (36.6 C) 98.9 F (37.2 C) 97.8 F (36.6 C) 98 F (36.7 C)  TempSrc:  Oral Oral Oral  SpO2: 99% 100% 98% 96%  Weight: 78 kg (172 lb)     Height: 5' (1.524 m)       NAD ABD soft Sensation intact distally Intact pulses distally Dorsiflexion/Plantar flexion intact Incision: dressing C/D/I Compartment soft   Lab Results  Component Value Date   WBC 14.7 (H) 01/27/2017   HGB 11.9 (L) 01/27/2017   HCT 34.3 (L) 01/27/2017   MCV 95.3 01/27/2017   PLT 166 01/27/2017   BMET    Component Value Date/Time   NA 133 (L) 01/27/2017 0709   K 4.0 01/27/2017 0709   CL 100 (L) 01/27/2017 0709   CO2 22 01/27/2017 0709   GLUCOSE 205 (H) 01/27/2017 0709   BUN 16 01/27/2017 0709   CREATININE 0.94 01/27/2017 0709   CALCIUM 8.6 (L) 01/27/2017 0709   GFRNONAA 59 (L) 01/27/2017 0709   GFRAA >60 01/27/2017 0709     Assessment/Plan: 1 Day Post-Op   Principal Problem:   Osteoarthritis of right knee   WBAT with walker DVT ppx: ASA, SCDs, TEDs PO pain control PT/OT DM2: strict glucose control Dispo: D/C home tomorrow with outpatient PT - already set up   [name] [name], [name] [name] 01/27/2017, 1:08 PM   [name] Can, MD Cell [phone]

## 2017-01-27 NOTE — Evaluation (Signed)
Occupational Therapy Evaluation Patient Details Name: [name] [name] MRN: [id number] DOB: 1945/05/05 Today's Date: 01/27/2017    History of Present Illness 71 yo admitted for Right TKA. PMhx: CABG, CAD, CHF, HTN   Clinical Impression   This 71 y/o F presents with the above. At baseline Pt is independent with ADLs and functional mobility. Pt currently requires MinGuard-MinA for functional mobility and functional mobility transfers at RW level, MinA for LB ADLs. Pt will return home with spouse assist for ADLs PRN. Will continue to follow acutely to progress Pt's safety and independence with ADLs and mobility.     Follow Up Recommendations  DC plan and follow up therapy as arranged by surgeon;Supervision/Assistance - 24 hour    Equipment Recommendations  None recommended by OT           Precautions / Restrictions Precautions Precautions: Knee Restrictions Weight Bearing Restrictions: Yes RLE Weight Bearing: Weight bearing as tolerated      Mobility Bed Mobility Overal bed mobility: Modified Independent             General bed mobility comments: increased time for sit to supine  Transfers Overall transfer level: Needs assistance   Transfers: Sit to/from Stand Sit to Stand: Supervision         General transfer comment: cues for hand placement     Balance Overall balance assessment: Needs assistance   Sitting balance-Leahy Scale: Normal     Standing balance support: No upper extremity supported;Bilateral upper extremity supported Standing balance-Leahy Scale: Fair                             ADL either performed or assessed with clinical judgement   ADL Overall ADL's : Needs assistance/impaired Eating/Feeding: Independent;Sitting   Grooming: Set up;Sitting   Upper Body Bathing: Min guard;Sitting   Lower Body Bathing: Minimal assistance;Sit to/from stand   Upper Body Dressing : Min guard;Sitting   Lower Body Dressing: Minimal  assistance;Sit to/from stand Lower Body Dressing Details (indicate cue type and reason): Pt able to reach and doff/don sock on RLE while seated EOB  Toilet Transfer: Minimal assistance;Ambulation;Comfort height toilet;Grab bars;RW   Toileting- Clothing Manipulation and Hygiene: Minimal assistance;Sit to/from stand       Functional mobility during ADLs: Min guard;Rolling walker General ADL Comments: educated on compensatory techniques for completing ADLs                          Pertinent Vitals/Pain Pain Assessment: Faces Pain Score: 4  Faces Pain Scale: Hurts little more Pain Location: right  knee Pain Descriptors / Indicators: Aching Pain Intervention(s): Limited activity within patient's tolerance;Monitored during session          Extremity/Trunk Assessment Upper Extremity Assessment Upper Extremity Assessment: Overall WFL for tasks assessed   Lower Extremity Assessment Lower Extremity Assessment: Defer to PT evaluation       Communication Communication Communication: No difficulties   Cognition Arousal/Alertness: Awake/alert Behavior During Therapy: WFL for tasks assessed/performed Overall Cognitive Status: Within Functional Limits for tasks assessed                                                     Home Living Family/patient expects to be discharged to:: Private residence Living Arrangements: Spouse/significant other  Available Help at Discharge: Available 24 hours/day Type of Home: House Home Access: Stairs to enter [employer] of Steps: 1   Home Layout: One level     Bathroom Shower/Tub: Occupational psychologist: Standard     Home Equipment: Bedside commode;Walker - 2 wheels          Prior Functioning/Environment Level of Independence: Independent                 OT Problem List: Impaired balance (sitting and/or standing);Decreased strength;Decreased activity tolerance;Decreased knowledge  of use of DME or AE;Decreased knowledge of precautions      OT Treatment/Interventions: Self-care/ADL training;DME and/or AE instruction;Therapeutic activities;Therapeutic exercise;Balance training;Patient/family education;Energy conservation    OT Goals(Current goals can be found in the care plan section) Acute Rehab OT Goals Patient Stated Goal: walk without pain OT Goal Formulation: With patient Time For Goal Achievement: 02/10/17 Potential to Achieve Goals: Good  OT Frequency: Min 2X/week                             AM-PAC PT "6 Clicks" Daily Activity     Outcome Measure Help from another person eating meals?: None Help from another person taking care of personal grooming?: A Little Help from another person toileting, which includes using toliet, bedpan, or urinal?: A Little Help from another person bathing (including washing, rinsing, drying)?: A Little Help from another person to put on and taking off regular upper body clothing?: None Help from another person to put on and taking off regular lower body clothing?: A Little 6 Click Score: 20   End of Session Equipment Utilized During Treatment: Gait belt;Rolling walker Nurse Communication: Mobility status  Activity Tolerance: Patient tolerated treatment well Patient left: in chair;with call bell/phone within reach  OT Visit Diagnosis: Other abnormalities of gait and mobility (R26.89)                Time: 0165-5374 OT Time Calculation (min): 23 min Charges:  OT General Charges $OT Visit: 1 Visit OT Evaluation $OT Eval Low Complexity: 1 Low G-Codes:     [name] [name], OT Pager [phone] 01/27/2017  [name] [name] 01/27/2017, 5:14 PM

## 2017-01-28 LAB — GLUCOSE, CAPILLARY
Glucose-Capillary: 141 mg/dL — ABNORMAL HIGH (ref 65–99)
Glucose-Capillary: 171 mg/dL — ABNORMAL HIGH (ref 65–99)

## 2017-01-28 LAB — TYPE AND SCREEN
ABO/RH(D): O NEG
Antibody Screen: POSITIVE
DAT, IgG: NEGATIVE
Donor AG Type: NEGATIVE
Donor AG Type: NEGATIVE
Unit division: 0
Unit division: 0

## 2017-01-28 LAB — CBC
HCT: 32.9 % — ABNORMAL LOW (ref 36.0–46.0)
Hemoglobin: 11.1 g/dL — ABNORMAL LOW (ref 12.0–15.0)
MCH: 32.3 pg (ref 26.0–34.0)
MCHC: 33.7 g/dL (ref 30.0–36.0)
MCV: 95.6 fL (ref 78.0–100.0)
Platelets: 149 10*3/uL — ABNORMAL LOW (ref 150–400)
RBC: 3.44 MIL/uL — ABNORMAL LOW (ref 3.87–5.11)
RDW: 12.7 % (ref 11.5–15.5)
WBC: 14.1 10*3/uL — ABNORMAL HIGH (ref 4.0–10.5)

## 2017-01-28 LAB — BPAM RBC
Blood Product Expiration Date: 201810262359
Blood Product Expiration Date: 201810292359
Unit Type and Rh: 9500
Unit Type and Rh: 9500

## 2017-01-28 NOTE — Progress Notes (Signed)
   Subjective:  Patient reports pain as mild to moderate.  Denies N/V/CP/SOB.  Objective:   VITALS:   Vitals:   01/27/17 0617 01/27/17 1300 01/27/17 2050 01/28/17 0414  BP: (!) 122/56 (!) 114/45 (!) 131/57 (!) 143/63  Pulse: 67 66 65 61  Resp: 16 16 15 18   Temp: 98 F (36.7 C) 98 F (36.7 C) 98.2 F (36.8 C) 97.9 F (36.6 C)  TempSrc: Oral Oral Oral Oral  SpO2: 96% 100% 99% 100%  Weight:      Height:        NAD ABD soft Sensation intact distally Intact pulses distally Dorsiflexion/Plantar flexion intact Incision: dressing C/D/I Compartment soft   Lab Results  Component Value Date   WBC 14.1 (H) 01/28/2017   HGB 11.1 (L) 01/28/2017   HCT 32.9 (L) 01/28/2017   MCV 95.6 01/28/2017   PLT 149 (L) 01/28/2017   BMET    Component Value Date/Time   NA 133 (L) 01/27/2017 0709   K 4.0 01/27/2017 0709   CL 100 (L) 01/27/2017 0709   CO2 22 01/27/2017 0709   GLUCOSE 205 (H) 01/27/2017 0709   BUN 16 01/27/2017 0709   CREATININE 0.94 01/27/2017 0709   CALCIUM 8.6 (L) 01/27/2017 0709   GFRNONAA 59 (L) 01/27/2017 0709   GFRAA >60 01/27/2017 0709     Assessment/Plan: 2 Days Post-Op   Principal Problem:   Osteoarthritis of right knee   WBAT with walker DVT ppx: ASA, SCDs, TEDs PO pain control PT/OT DM2: strict glucose control Dispo: D/C home with outpatient PT - already set up   Corynne Scibilia, [name] 01/28/2017, 1:17 PM   Rod Can, MD Cell [phone]

## 2017-01-28 NOTE — Progress Notes (Signed)
Occupational Therapy Treatment Patient Details Name: [name] [name] MRN: [id number] DOB: 1945-07-16 Today's Date: 01/28/2017    History of present illness 71 yo admitted for Right TKA. PMhx: CABG, CAD, CHF, HTN   OT comments  Pt progressing well towards goals. Completed room level functional mobility, functional mobility transfers, seated and standing ADLs at RW level with MinGuard assist throughout, requires MinA for LB ADLs. Feel Pt will safely progress home with spouse assist for ADLs PRN. Will continue to follow while in acute setting to continue progressing Pt's safety and independence with ADLs and mobility.    Follow Up Recommendations  DC plan and follow up therapy as arranged by surgeon;Supervision/Assistance - 24 hour    Equipment Recommendations  None recommended by OT          Precautions / Restrictions Precautions Precautions: Knee Restrictions Weight Bearing Restrictions: Yes RLE Weight Bearing: Weight bearing as tolerated       Mobility Bed Mobility Overal bed mobility: Needs Assistance Bed Mobility: Supine to Sit     Supine to sit: Supervision;HOB elevated     General bed mobility comments: supervision for safety   Transfers Overall transfer level: Needs assistance Equipment used: Rolling walker (2 wheeled) Transfers: Sit to/from Stand Sit to Stand: Supervision         General transfer comment: Pt demonstrates proper hand placement during sit<>stand     Balance Overall balance assessment: Needs assistance   Sitting balance-Leahy Scale: Normal     Standing balance support: Bilateral upper extremity supported;During functional activity;No upper extremity supported Standing balance-Leahy Scale: Fair Standing balance comment: Pt maintains static standing during clothing management for toileting and standing grooming ADLs with close guard for safety                            ADL either performed or assessed with clinical judgement    ADL Overall ADL's : Needs assistance/impaired     Grooming: Min guard;Standing;Wash/dry hands;Oral care               Lower Body Dressing: Minimal assistance;Sit to/from stand Lower Body Dressing Details (indicate cue type and reason): Pt dons socks seated EOB at start of session  Toilet Transfer: Min guard;Ambulation;Comfort height toilet;Grab bars;RW   Toileting- Water quality scientist and Hygiene: Min guard;Sit to/from stand   Tub/ Shower Transfer: Walk-in shower;Min guard;Ambulation;Shower Technical sales engineer Details (indicate cue type and reason): close min guard for safety; Pt able to recall proper sequence for transfer completion  Functional mobility during ADLs: Min guard;Rolling walker General ADL Comments: Pt demonstrates good safety awareness during ADLs and functional mobility                        Cognition Arousal/Alertness: Awake/alert Behavior During Therapy: WFL for tasks assessed/performed Overall Cognitive Status: Within Functional Limits for tasks assessed                                                            Pertinent Vitals/ Pain       Pain Assessment: Faces Faces Pain Scale: Hurts a little bit Pain Location: right  knee Pain Descriptors / Indicators: Aching Pain Intervention(s): Monitored during session;Ice applied  Frequency  Min 2X/week        Progress Toward Goals  OT Goals(current goals can now be found in the care plan section)  Progress towards OT goals: Progressing toward goals  Acute Rehab OT Goals Patient Stated Goal: walk without pain OT Goal Formulation: With patient Time For Goal Achievement: 02/10/17 Potential to Achieve Goals: Good  Plan Discharge plan remains appropriate                    AM-PAC PT "6 Clicks" Daily Activity     Outcome Measure   Help from another  person eating meals?: None Help from another person taking care of personal grooming?: A Little Help from another person toileting, which includes using toliet, bedpan, or urinal?: A Little Help from another person bathing (including washing, rinsing, drying)?: A Little Help from another person to put on and taking off regular upper body clothing?: None Help from another person to put on and taking off regular lower body clothing?: A Little 6 Click Score: 20    End of Session Equipment Utilized During Treatment: Gait belt;Rolling walker  OT Visit Diagnosis: Other abnormalities of gait and mobility (R26.89)   Activity Tolerance Patient tolerated treatment well   Patient Left in chair;with call bell/phone within reach   Nurse Communication Mobility status        Time: 0902-0927 OT Time Calculation (min): 25 min  Charges: OT General Charges $OT Visit: 1 Visit OT Treatments $Self Care/Home Management : 8-22 mins  Lou Cal, OT Pager [phone] 01/28/2017    Raymondo Band 01/28/2017, 10:45 AM

## 2017-01-28 NOTE — Progress Notes (Signed)
Physical Therapy Treatment Patient Details Name: [name] [name] MRN: [id number] DOB: 02/22/1946 Today's Date: 01/28/2017    History of Present Illness 71 yo admitted for Right TKA. PMhx: CABG, CAD, CHF, HTN    PT Comments    Pt performed increased activity during session this am.  PTA reviewed and complete stair training and exercise progression with patient in prep for d/c home.  Plan to f/u in pm if patient remains hospitalized.  Pt tolerating progression well and ready from a mobility stand point to d/c home with assistance from her husband.    Follow Up Recommendations  DC plan and follow up therapy as arranged by surgeon     Equipment Recommendations  None recommended by PT    Recommendations for Other Services       Precautions / Restrictions Precautions Precautions: Knee Restrictions Weight Bearing Restrictions: Yes RLE Weight Bearing: Weight bearing as tolerated    Mobility  Bed Mobility Overal bed mobility: Needs Assistance Bed Mobility: Supine to Sit     Supine to sit: Supervision;HOB elevated     General bed mobility comments: supervision for safety   Transfers Overall transfer level: Needs assistance Equipment used: Rolling walker (2 wheeled) Transfers: Sit to/from Stand Sit to Stand: Supervision         General transfer comment: Pt demonstrates proper hand placement during sit<>stand   Ambulation/Gait Ambulation/Gait assistance: Supervision Ambulation Distance (Feet): 250 Feet Assistive device: Rolling walker (2 wheeled) Gait Pattern/deviations: Step-through pattern;Decreased stride length;Trunk flexed   Gait velocity interpretation: Below normal speed for age/gender General Gait Details: cues for posture, looking up, sequence, shoulder depression   Stairs Stairs: Yes   Stair Management: No rails;Step to pattern;Backwards;Forwards;With walker Number of Stairs: 2 General stair comments: Cues for sequencing and RW placement.  Pt required  min assist from husband to stabilize RW.  Pt tolerated tx well.  Pt performed forward position to descend and backwards to ascend.    Wheelchair Mobility    Modified Rankin (Stroke Patients Only)       Balance Overall balance assessment: Needs assistance   Sitting balance-Leahy Scale: Normal     Standing balance support: Bilateral upper extremity supported;During functional activity;No upper extremity supported Standing balance-Leahy Scale: Fair Standing balance comment: Pt maintains static standing during clothing management for toileting and standing grooming ADLs with close guard for safety                             Cognition Arousal/Alertness: Awake/alert Behavior During Therapy: WFL for tasks assessed/performed Overall Cognitive Status: Within Functional Limits for tasks assessed                                        Exercises Total Joint Exercises Ankle Circles/Pumps: AROM;Both;10 reps;Supine Quad Sets: AROM;Right;10 reps;Supine Towel Squeeze: AROM;Both;10 reps;Supine Short Arc Quad: AROM;Right;10 reps;Supine Heel Slides: Right;Supine;10 reps;AROM Hip ABduction/ADduction: AROM;Right;Supine;10 reps Straight Leg Raises: Right;Supine;10 reps;AAROM Goniometric ROM: 68 degrees flexion in R knee.      General Comments        Pertinent Vitals/Pain Pain Assessment: 0-10 Pain Score: 7  Faces Pain Scale: Hurts a little bit Pain Location: right  knee Pain Descriptors / Indicators: Aching Pain Intervention(s): Monitored during session;Repositioned;Patient requesting pain meds-RN notified (ice in room for application when patient finishes toileting.  )    Home Living  Prior Function            PT Goals (current goals can now be found in the care plan section) Acute Rehab PT Goals Patient Stated Goal: walk without pain Potential to Achieve Goals: Good Progress towards PT goals: Progressing toward goals     Frequency    7X/week      PT Plan Current plan remains appropriate    Co-evaluation              AM-PAC PT "6 Clicks" Daily Activity  Outcome Measure  Difficulty turning over in bed (including adjusting bedclothes, sheets and blankets)?: A Little Difficulty moving from lying on back to sitting on the side of the bed? : A Little Difficulty sitting down on and standing up from a chair with arms (e.g., wheelchair, bedside commode, etc,.)?: A Little Help needed moving to and from a bed to chair (including a wheelchair)?: A Little Help needed walking in hospital room?: A Little Help needed climbing 3-5 steps with a railing? : A Little 6 Click Score: 18    End of Session Equipment Utilized During Treatment: Gait belt Activity Tolerance: Patient tolerated treatment well Patient left: in bed;with call bell/phone within reach;with family/visitor present Nurse Communication: Mobility status;Precautions PT Visit Diagnosis: Difficulty in walking, not elsewhere classified (R26.2);Muscle weakness (generalized) (M62.81);Pain Pain - Right/Left: Right Pain - part of body: Knee     Time: 9371-6967 PT Time Calculation (min) (ACUTE ONLY): 29 min  Charges:  $Gait Training: 8-22 mins $Therapeutic Exercise: 8-22 mins                    G Codes:       [name] [name], PTA pager [phone]    [name] [name] 01/28/2017, 11:37 AM

## 2017-03-02 ENCOUNTER — Other Ambulatory Visit: Payer: Self-pay

## 2017-03-02 MED ORDER — LOSARTAN POTASSIUM 25 MG PO TABS: 25.0000 mg | ORAL_TABLET | Freq: Every day | ORAL | 0 refills | Status: DC

## 2017-03-10 ENCOUNTER — Other Ambulatory Visit: Payer: Self-pay | Admitting: Cardiology

## 2017-03-10 MED ORDER — ATORVASTATIN CALCIUM 10 MG PO TABS
ORAL_TABLET | ORAL | 1 refills | Status: DC
Start: 2017-03-10 — End: 2017-12-17

## 2017-03-30 ENCOUNTER — Other Ambulatory Visit: Payer: Self-pay | Admitting: *Deleted

## 2017-03-30 MED ORDER — CARVEDILOL 6.25 MG PO TABS: 6.2500 mg | ORAL_TABLET | Freq: Two times a day (BID) | ORAL | 2 refills | Status: DC

## 2017-04-28 ENCOUNTER — Telehealth: Payer: Self-pay

## 2017-04-28 NOTE — Telephone Encounter (Signed)
Appointment made with Vin, patient notified and voiced understanding.

## 2017-04-28 NOTE — Progress Notes (Signed)
Please place orders in Epic as patient is being scheduled for a pre-op appointment! Thank you! 

## 2017-04-28 NOTE — Telephone Encounter (Signed)
   [facility] Pre-operative Risk Assessment    Request for surgical clearance:  1. What type of surgery is being performed?  Left knee: left TKA (complex) w/navigation, CAS Image-less  2. When is this surgery scheduled? 05-28-2017  3. Are there any medications that need to be held prior to surgery and how long? Aspirin  Practice name and name of physician performing surgery? [facility] - Rod Can, MD  4. What is your office phone and fax number? Phone: [phone] Fax: [phone] ([place]: Santiago Bur)  5. Anesthesia type (None, local, MAC, general) ? Spinal    [name] [name] 04/28/2017, 9:19 AM  _________________________________________________________________   (provider comments below)

## 2017-04-28 NOTE — Telephone Encounter (Signed)
   Primary Cardiologist:Traci Turner, MD  Chart reviewed as part of pre-operative protocol coverage. Because of [name] [name]'s past medical history and time since last visit, he/she will require a follow-up visit in order to better assess preoperative cardiovascular risk.  Pre-op covering staff: - Please schedule appointment and call patient to inform them. - Please contact requesting surgeon's office via preferred method (i.e, phone, fax) to inform them of need for appointment prior to surgery.  Rosaria Ferries, PA-C  04/28/2017, 2:51 PM

## 2017-04-29 ENCOUNTER — Ambulatory Visit: Payer: Self-pay | Admitting: Orthopedic Surgery

## 2017-05-11 NOTE — Progress Notes (Signed)
Cardiology Office Note    Date:  05/12/2017   ID:  Khadija, Thier [birth year], MRN [id number]  PCP:  Lajean Manes, MD  Cardiologist:  Dr. Radford Pax  Chief Complaint: Pre-op clearance for Left knee: left TKA (complex) w/navigation, CAS Image-less History of Present Illness:   [name] [name] is a 72 y.o. female  with a history of PVC's, dilated aortic root, severe AR, HTN, severe LV dysfunction with EF 25% (now normalized), CAD with severe mid LAD stenosis s/p 1 vessel CABG (LIMA to LAD) and Bentall procedure with aortic root replacement with pericardial tissue AVR who presents for surgical clearance.   She was doing well on cardiac stand point when last seen by Dr. Radford Pax 06/2016.  Today she presented for surgical clearance. She has tolerated R knee surgery 01/2017 without any cardiac complications. She rides bicycle 5 times/week for 2 miles without chest pain or shortness of breath. The patient denies nausea, vomiting, fever, chest pain, palpitations, shortness of breath, orthopnea, PND, dizziness, syncope, cough, congestion, abdominal pain, hematochezia, melena, lower extremity edema.  Past Medical History:  Diagnosis Date  . Adenomatous colon polyp   . Aortic regurgitation    severe by TEE s/p pericardia tissue AVR  . Arthritis   . Cardiomyopathy ([place])    EF initally 25% but now normalized  . Chronic diastolic CHF (congestive heart failure), NYHA class 2 ([place])   . Coronary artery disease 10/05/2013   Severe mid LAD stenosis s/p 1 vessel CABG with LIMA to LAD  . Diabetes mellitus without complication ([place])    DX 20 YRS AGO.   TYPE 2  . Dilated aortic root ([place])   . GERD (gastroesophageal reflux disease)    Tums prn  . Gout   . Gout   . Heart murmur   . Hypercholesteremia    LDL goal < 70  . Hypertension   . S/P Bio-Bentall aortic root replacement with bioprosthetic valve conduit 10/27/2013   23 mm [facility] Ease bovine pericardial tissue valve and 26 mm Vascutek  gelweave Val-Salva aortic graft  . S/P CABG x 1 10/27/2013   LIMA to LAD  . Thyroiditis 10/2009   lab and u/s-thyroid function normalized in 9/11    Past Surgical History:  Procedure Laterality Date  . ABDOMINAL HYSTERECTOMY    . AORTIC VALVE REPLACEMENT N/A 10/27/2013   Procedure: AORTIC VALVE REPLACEMENT (AVR);  Surgeon: Rexene Alberts, MD;  Location: [place];  Service: Open Heart Surgery;  Laterality: N/A;  . ASCENDING AORTIC ROOT REPLACEMENT N/A 10/27/2013   Procedure: ASCENDING AORTIC ROOT REPLACEMENT;  Surgeon: Rexene Alberts, MD;  Location: [place];  Service: Open Heart Surgery;  Laterality: N/A;  . BUNIONECTOMY     bilateral  . CARDIAC CATHETERIZATION    . CARDIAC VALVE REPLACEMENT     AORTIC VALVE  . CARPAL TUNNEL RELEASE Right   . CATARACT EXTRACTION Bilateral   . COLONOSCOPY    . COLONOSCOPY WITH PROPOFOL N/A 05/08/2015   Procedure: COLONOSCOPY WITH PROPOFOL;  Surgeon: Garlan Fair, MD;  Location: [facility] ENDOSCOPY;  Service: Endoscopy;  Laterality: N/A;  . CORONARY ARTERY BYPASS GRAFT N/A 10/27/2013   Procedure: CORONARY ARTERY BYPASS GRAFTING (CABG) x 1 - LIMA to LAD;  Surgeon: Rexene Alberts, MD;  Location: [place];  Service: Open Heart Surgery;  Laterality: N/A;  . EYE SURGERY     BILATERAL CATARACTS  . INTRAOPERATIVE TRANSESOPHAGEAL ECHOCARDIOGRAM N/A 10/27/2013   Procedure: INTRAOPERATIVE TRANSESOPHAGEAL ECHOCARDIOGRAM;  Surgeon: Braulio Conte  Keturah Barre, MD;  Location: [place];  Service: Open Heart Surgery;  Laterality: N/A;  . KNEE ARTHROPLASTY Right 01/26/2017   Procedure: RIGHT TOTAL KNEE ARTHROPLASTY WITH COMPUTER NAVIGATION;  Surgeon: Rod Can, MD;  Location: [place];  Service: Orthopedics;  Laterality: Right;  Needs RNFA  . LEFT AND RIGHT HEART CATHETERIZATION WITH CORONARY ANGIOGRAM N/A 10/05/2013   Procedure: LEFT AND RIGHT HEART CATHETERIZATION WITH CORONARY ANGIOGRAM;  Surgeon: Blane Ohara, MD;  Location: [facility] CATH LAB;  Service: Cardiovascular;  Laterality: N/A;  . TEE  WITHOUT CARDIOVERSION N/A 10/04/2013   Procedure: TRANSESOPHAGEAL ECHOCARDIOGRAM (TEE);  Surgeon: Sueanne Margarita, MD;  [facility] ENDOSCOPY;  Service: Cardiovascular;  Laterality: N/A;  . tubular adenomatous polyp colonoscopy  12/31/2009    Current Medications:  Prior to Admission medications   Medication Sig Start Date End Date Taking? Authorizing Provider  aspirin EC 81 MG tablet Take 81 mg by mouth daily.   Yes [provider]  atorvastatin (LIPITOR) 10 MG tablet TAKE 1 TABLET BY MOUTH EVERY DAY AT 6PM. Please make yearly appt with Dr. Radford Pax for March before anymore refills. 1st attempt 03/10/17  Yes Turner, Eber Hong, MD  Calcium-Vitamin D (CALTRATE 600 PLUS-VIT D PO) Take 1 tablet by mouth daily.   Yes [provider]  carvedilol (COREG) 6.25 MG tablet Take 1 tablet (6.25 mg total) by mouth 2 (two) times daily with a meal. 03/30/17  Yes Turner, Traci R, MD  clindamycin (CLEOCIN) 150 MG capsule Take 4 capsules by mouth as needed. (Take one (1) hour prior to dental procedures) 03/26/15  Yes [provider]  colchicine (COLCRYS) 0.6 MG tablet Take 0.6 mg by mouth daily.   Yes [provider]  famotidine (PEPCID) 10 MG tablet Take 10 mg by mouth daily as needed for heartburn.    Yes [provider]  GLUCOSAMINE-CHONDROITIN PO Take 1 tablet by mouth daily. Glucosamine 1200 mg, chondroitin 1500 mg   Yes [provider]  losartan (COZAAR) 25 MG tablet Take 1 tablet (25 mg total) by mouth daily. 03/02/17  Yes Turner, Eber Hong, MD  metFORMIN (GLUCOPHAGE-XR) 500 MG 24 hr tablet Take 1,000 mg by mouth at bedtime.    Yes [provider]  Multiple Vitamin (MULTIVITAMIN WITH MINERALS) TABS tablet Take 1 tablet by mouth daily. Centrum Silver   Yes [provider]  ONE TOUCH ULTRA TEST test strip  05/11/17  Yes [provider]  pramipexole (MIRAPEX) 0.125 MG tablet Take 0.125 mg by mouth at bedtime. 12/26/16  Yes [provider]     Allergies:   Atenolol; Lisinopril; Penicillin g; Penicillins; and Sulfa antibiotics   Social History   Socioeconomic History  . Marital status: Married    Spouse name: None  . Number of children: None  . Years of education: None  . Highest education level: None  Social Needs  . Financial resource strain: None  . Food insecurity - worry: None  . Food insecurity - inability: None  . Transportation needs - medical: None  . Transportation needs - non-medical: None  Occupational History  . None  Tobacco Use  . Smoking status: Never Smoker  . Smokeless tobacco: Never Used  Substance and Sexual Activity  . Alcohol use: No  . Drug use: No  . Sexual activity: None  Other Topics Concern  . None  Social History Narrative  . None     Family History:  The patient's family history includes Arthritis/Rheumatoid in her mother; Cancer  in her brother; Diabetes in her mother; Heart attack in her father; Hypertension in her mother; Stroke in her sister.   ROS:   Please see the history of present illness.    ROS All other systems reviewed and are negative.   PHYSICAL EXAM:   VS:  BP 114/72   Pulse 74   Ht 5' (1.524 m)   Wt 166 lb 8 oz (75.5 kg)   SpO2 98%   BMI 32.52 kg/m    GEN: Well nourished, well developed, in no acute distress  HEENT: normal  Neck: no JVD, carotid bruits, or masses Cardiac: RRR; systolic murmurs, rubs, or gallops,no edema  Respiratory:  clear to auscultation bilaterally, normal work of breathing GI: soft, nontender, nondistended, + BS MS: no deformity or atrophy  Skin: warm and dry, no rash Neuro:  Alert and Oriented x 3, Strength and sensation are intact Psych: euthymic mood, full affect  Wt Readings from Last 3 Encounters:  05/12/17 166 lb 8 oz (75.5 kg)  01/26/17 172 lb (78 kg)  01/19/17 169 lb 3.2 oz (76.7 kg)      Studies/Labs Reviewed:   EKG:  EKG is ordered today.  The ekg ordered today demonstrates NSR at rate of 74  bpm  Recent Labs: 01/27/2017: BUN 16; Creatinine, Ser 0.94; Potassium 4.0; Sodium 133 01/28/2017: Hemoglobin 11.1; Platelets 149   Lipid Panel No results found for: CHOL, TRIG, HDL, CHOLHDL, VLDL, LDLCALC, LDLDIRECT  Additional studies/ records that were reviewed today include:   Echocardiogram: 07/2015 Study Conclusions  - Left ventricle: The cavity size was normal. Wall thickness was   normal. Systolic function was normal. The estimated ejection   fraction was in the range of 50% to 55%. Wall motion was normal;   there were no regional wall motion abnormalities. Doppler   parameters are consistent with abnormal left ventricular   relaxation (grade 1 diastolic dysfunction). - Aortic valve: A bioprosthesis was present. There was trivial   regurgitation. - Mitral valve: Mildly calcified annulus.   Cardiac Catheterization: 10/2013 Coronary angiography: Coronary dominance: right  Left mainstem: The left main is calcified. The proximal and mid left main are widely patent. The distal left main has 20-30% stenosis.  Left anterior descending (LAD): The LAD has mild calcification. The proximal vessel is widely patent with minor irregularity. The diagonal branches are widely patent. The mid LAD just after the second diagonal has a tight 90% stenosis. This is a focal lesion.  Left circumflex (LCx): The left circumflex is patent. The first obtuse marginal branch is widely patent with no obstruction noted.  Right coronary artery (RCA): The right coronary artery is dominant. The vessel has mild diffuse calcification. The mid vessel has 20-30% stenosis. There is minor irregularity noted throughout the proximal, mid, and distal vessel. The PDA and PLA branches are widely patent.  Left ventriculography: There is mild global cardiomyopathy noted with an LVEF estimated at about 40%. There is no significant mitral regurgitation.  Aortic root angiography: The proximal ascending aorta is  dilated. There is severe aortic valve insufficiency noted.  Final Conclusions:   1. Severe mid LAD stenosis with otherwise minor nonobstructive CAD 2. Mild global LV systolic dysfunction 3. Dilated aortic root with severe aortic valve insufficiency 4. Normal intracardiac hemodynamics    ASSESSMENT & PLAN:    1. CAD s/p 1 vessel CABG with LIMA to LAD at time of AVR.   - No anginal symptoms. Continue on ASA/statin and BB.    2. Aortic insufficiency  s/p Bentall procedure with pericardial tissue AVR - She will continue on ASA. Consider repeat echo during follow up.   3. HTN  - BP controlled on current meds.  She will continue current regimen.   4. Chronic diastolic CHF NYHA class 2  - she appears euvolemic. No change.  5. Hyperlipidemia with LDL goal < 70.   -Followed by PCP  6. Surgical clearance - She has no signs and symptoms of angina or CHF. She has tolerate R knee surgery few months ago without any complications.   Given past medical history and based on ACC/AHA guidelines, [name] [name] would be at acceptable risk for the planned procedure without further cardiovascular testing. Ok to hold ASA 5 days prior to surgery. Resume when able.   I will route this recommendation to the requesting party via Epic fax function and remove from pre-op pool.  Please call with questions.  [place], [place] 05/12/2017, 10:48 AM       Medication Adjustments/Labs and Tests Ordered: Current medicines are reviewed at length with the patient today.  Concerns regarding medicines are outlined above.  Medication changes, Labs and Tests ordered today are listed in the Patient Instructions below. Patient Instructions  Medication Instructions:  Your physician recommends that you continue on your current medications as directed. Please refer to the Current Medication list given to you today.   Labwork: None ordered  Testing/Procedures: None ordered  Follow-Up: Your physician  recommends that you schedule a follow-up appointment in: 2-3 MONTHS WITH DR. Radford Pax     Any Other Special Instructions Will Be Listed Below (If Applicable).     If you need a refill on your cardiac medications before your next appointment, please call your pharmacy.      Jarrett Soho, [place]  05/12/2017 10:46 AM    [facility] [place], [place], [place]  [address] Phone: [phone]; Fax: [phone]

## 2017-05-12 ENCOUNTER — Ambulatory Visit: Payer: Medicare Other | Admitting: Physician Assistant

## 2017-05-12 ENCOUNTER — Encounter: Payer: Self-pay | Admitting: Physician Assistant

## 2017-05-12 VITALS — BP 114/72 | HR 74 | Ht 60.0 in | Wt 166.5 lb

## 2017-05-12 DIAGNOSIS — I2510 Atherosclerotic heart disease of native coronary artery without angina pectoris: Secondary | ICD-10-CM

## 2017-05-12 DIAGNOSIS — I10 Essential (primary) hypertension: Secondary | ICD-10-CM

## 2017-05-12 DIAGNOSIS — I351 Nonrheumatic aortic (valve) insufficiency: Secondary | ICD-10-CM | POA: Diagnosis not present

## 2017-05-12 DIAGNOSIS — Z01810 Encounter for preprocedural cardiovascular examination: Secondary | ICD-10-CM | POA: Diagnosis not present

## 2017-05-12 DIAGNOSIS — I493 Ventricular premature depolarization: Secondary | ICD-10-CM | POA: Diagnosis not present

## 2017-05-12 DIAGNOSIS — I5032 Chronic diastolic (congestive) heart failure: Secondary | ICD-10-CM

## 2017-05-12 NOTE — Patient Instructions (Signed)
Medication Instructions:  Your physician recommends that you continue on your current medications as directed. Please refer to the Current Medication list given to you today.   Labwork: None ordered  Testing/Procedures: None ordered  Follow-Up: Your physician recommends that you schedule a follow-up appointment in: 2-3 MONTHS WITH DR. TURNER   Any Other Special Instructions Will Be Listed Below (If Applicable).     If you need a refill on your cardiac medications before your next appointment, please call your pharmacy.   

## 2017-05-13 ENCOUNTER — Ambulatory Visit: Payer: Self-pay | Admitting: Orthopedic Surgery

## 2017-05-13 NOTE — H&P (Signed)
TOTAL KNEE ADMISSION H&P  Patient is being admitted for left total knee arthroplasty.  Subjective:  Chief Complaint:left knee pain.  HPI: [name] [name], 72 y.o. female, has a history of pain and functional disability in the left knee due to arthritis and has failed non-surgical conservative treatments for greater than 12 weeks to includeNSAID's and/or analgesics, corticosteriod injections, flexibility and strengthening excercises, supervised PT with diminished ADL's post treatment, use of assistive devices, weight reduction as appropriate and activity modification.  Onset of symptoms was gradual, starting >10 years ago with gradually worsening course since that time. The patient noted no past surgery on the left knee(s).  Patient currently rates pain in the left knee(s) at 10 out of 10 with activity. Patient has night pain, worsening of pain with activity and weight bearing, pain that interferes with activities of daily living, pain with passive range of motion, crepitus and joint swelling.  Patient has evidence of subchondral cysts, subchondral sclerosis, periarticular osteophytes, joint subluxation and joint space narrowing by imaging studies. There is no active infection.  Patient Active Problem List   Diagnosis Date Noted  . Osteoarthritis of right knee 01/26/2017  . Leg cramps 07/02/2015  . Dyslipidemia, goal LDL below 70 10/21/2014  . S/P Bio-Bentall aortic root replacement with bioprosthetic valve conduit 10/27/2013  . S/P CABG x 1 10/27/2013  . Aortic insufficiency 10/10/2013  . Chronic diastolic CHF (congestive heart failure), NYHA class 2 ([place])   . Coronary artery disease 10/05/2013  . PVC (premature ventricular contraction) 03/30/2013  . Essential hypertension, benign 03/30/2013   Past Medical History:  Diagnosis Date  . Adenomatous colon polyp   . Aortic regurgitation    severe by TEE s/p pericardia tissue AVR  . Arthritis   . Cardiomyopathy ([place])    EF initally 25% but  now normalized  . Chronic diastolic CHF (congestive heart failure), NYHA class 2 ([place])   . Coronary artery disease 10/05/2013   Severe mid LAD stenosis s/p 1 vessel CABG with LIMA to LAD  . Diabetes mellitus without complication ([place])    DX 20 YRS AGO.   TYPE 2  . Dilated aortic root ([place])   . GERD (gastroesophageal reflux disease)    Tums prn  . Gout   . Gout   . Heart murmur   . Hypercholesteremia    LDL goal < 70  . Hypertension   . S/P Bio-Bentall aortic root replacement with bioprosthetic valve conduit 10/27/2013   23 mm [facility] Ease bovine pericardial tissue valve and 26 mm Vascutek gelweave Val-Salva aortic graft  . S/P CABG x 1 10/27/2013   LIMA to LAD  . Thyroiditis 10/2009   lab and u/s-thyroid function normalized in 9/11    Past Surgical History:  Procedure Laterality Date  . ABDOMINAL HYSTERECTOMY    . AORTIC VALVE REPLACEMENT N/A 10/27/2013   Procedure: AORTIC VALVE REPLACEMENT (AVR);  Surgeon: Rexene Alberts, MD;  Location: [place];  Service: Open Heart Surgery;  Laterality: N/A;  . ASCENDING AORTIC ROOT REPLACEMENT N/A 10/27/2013   Procedure: ASCENDING AORTIC ROOT REPLACEMENT;  Surgeon: Rexene Alberts, MD;  Location: [place];  Service: Open Heart Surgery;  Laterality: N/A;  . BUNIONECTOMY     bilateral  . CARDIAC CATHETERIZATION    . CARDIAC VALVE REPLACEMENT     AORTIC VALVE  . CARPAL TUNNEL RELEASE Right   . CATARACT EXTRACTION Bilateral   . COLONOSCOPY    . COLONOSCOPY WITH PROPOFOL N/A 05/08/2015   Procedure: COLONOSCOPY  WITH PROPOFOL;  Surgeon: Garlan Fair, MD;  [facility]OS[facility];  Service: Endoscopy;  Laterality: N/A;  . CORONARY ARTERY BYPASS GRAFT N/A 10/27/2013   Procedure: CORONARY ARTERY BYPASS GRAFTING (CABG) x 1 - LIMA to LAD;  Surgeon: Rexene Alberts, MD;  Location: [place];  Service: Open Heart Surgery;  Laterality: N/A;  . EYE SURGERY     BILATERAL CATARACTS  . INTRAOPERATIVE TRANSESOPHAGEAL ECHOCARDIOGRAM N/A 10/27/2013   Procedure:  INTRAOPERATIVE TRANSESOPHAGEAL ECHOCARDIOGRAM;  Surgeon: Rexene Alberts, MD;  Location: [place];  Service: Open Heart Surgery;  Laterality: N/A;  . KNEE ARTHROPLASTY Right 01/26/2017   Procedure: RIGHT TOTAL KNEE ARTHROPLASTY WITH COMPUTER NAVIGATION;  Surgeon: Rod Can, MD;  Location: [place];  Service: Orthopedics;  Laterality: Right;  Needs RNFA  . LEFT AND RIGHT HEART CATHETERIZATION WITH CORONARY ANGIOGRAM N/A 10/05/2013   Procedure: LEFT AND RIGHT HEART CATHETERIZATION WITH CORONARY ANGIOGRAM;  Surgeon: Blane Ohara, MD;  [facility] CATH LAB;  Service: Cardiovascular;  Laterality: N/A;  . TEE WITHOUT CARDIOVERSION N/A 10/04/2013   Procedure: TRANSESOPHAGEAL ECHOCARDIOGRAM (TEE);  Surgeon: Sueanne Margarita, MD;  Location: [facility] ENDOSCOPY;  Service: Cardiovascular;  Laterality: N/A;  . tubular adenomatous polyp colonoscopy  12/31/2009    Current Outpatient Medications  Medication Sig Dispense Refill Last Dose  . aspirin EC 81 MG tablet Take 81 mg by mouth daily.   Taking  . atorvastatin (LIPITOR) 10 MG tablet TAKE 1 TABLET BY MOUTH EVERY DAY AT 6PM. Please make yearly appt with Dr. Radford Pax for March before anymore refills. 1st attempt 90 tablet 1 Taking  . Calcium-Vitamin D (CALTRATE 600 PLUS-VIT D PO) Take 1 tablet by mouth daily.   Taking  . carvedilol (COREG) 6.25 MG tablet Take 1 tablet (6.25 mg total) by mouth 2 (two) times daily with a meal. 180 tablet 2 Taking  . clindamycin (CLEOCIN) 150 MG capsule Take 4 capsules by mouth as needed. (Take one (1) hour prior to dental procedures)  2 Taking  . colchicine (COLCRYS) 0.6 MG tablet Take 0.6 mg by mouth daily.   Taking  . famotidine (PEPCID) 10 MG tablet Take 10 mg by mouth daily as needed for heartburn.    Taking  . GLUCOSAMINE-CHONDROITIN PO Take 1 tablet by mouth daily. Glucosamine 1200 mg, chondroitin 1500 mg   Taking  . losartan (COZAAR) 25 MG tablet Take 1 tablet (25 mg total) by mouth daily. 90 tablet 0 Taking  . metFORMIN  (GLUCOPHAGE-XR) 500 MG 24 hr tablet Take 1,000 mg by mouth at bedtime.    Taking  . Multiple Vitamin (MULTIVITAMIN WITH MINERALS) TABS tablet Take 1 tablet by mouth daily. Centrum Silver   Taking  . ONE TOUCH ULTRA TEST test strip    Taking  . pramipexole (MIRAPEX) 0.125 MG tablet Take 0.125 mg by mouth at bedtime.  6 Taking   No current facility-administered medications for this visit.    Allergies  Allergen Reactions  . Atenolol Shortness Of Breath and Other (See Comments)    Edema and Headache  . Lisinopril Cough  . Penicillin G   . Penicillins Other (See Comments)    Has patient had a PCN reaction causing immediate rash, facial/tongue/throat swelling, SOB or lightheadedness with hypotension: No Has patient had a PCN reaction causing severe rash involving mucus membranes or skin necrosis: No Has patient had a PCN reaction that required hospitalization No Has patient had a PCN reaction occurring within the last 10 years: No If all of the above  answers are "NO", then may proceed with Cephalosporin use.G Benzathine: Local injection rash  . Sulfa Antibiotics Hives and Rash    Social History   Tobacco Use  . Smoking status: Never Smoker  . Smokeless tobacco: Never Used  Substance Use Topics  . Alcohol use: No    Family History  Problem Relation Age of Onset  . Hypertension Mother   . Diabetes Mother   . Arthritis/Rheumatoid Mother   . Heart attack Father   . Stroke Sister   . Cancer Brother      Review of Systems  Constitutional: Negative.   HENT: Negative.   Eyes: Negative.   Respiratory: Negative.   Cardiovascular: Negative.   Gastrointestinal: Positive for heartburn.  Genitourinary: Negative.   Musculoskeletal: Positive for joint pain.  Skin: Negative.   Neurological: Negative.   Endo/Heme/Allergies: Negative.   Psychiatric/Behavioral: Negative.     Objective:  Physical Exam  Vitals reviewed. Constitutional: She is oriented to person, place, and time. She  appears well-developed and well-nourished.  HENT:  Head: Normocephalic and atraumatic.  Eyes: Conjunctivae and EOM are normal. Pupils are equal, round, and reactive to light.  Neck: Normal range of motion. Neck supple.  Cardiovascular: Normal rate, regular rhythm and intact distal pulses.  Respiratory: Effort normal. No respiratory distress.  GI: Soft. She exhibits no distension.  Genitourinary:  Genitourinary Comments: deferred  Musculoskeletal:       Left knee: She exhibits decreased range of motion, swelling, effusion, deformity and abnormal alignment. Tenderness found. Medial joint line and lateral joint line tenderness noted.  Neurological: She is alert and oriented to person, place, and time. She has normal reflexes.  Skin: Skin is warm and dry.  Psychiatric: She has a normal mood and affect. Her behavior is normal. Judgment and thought content normal.    Vital signs in last 24 hours: @VSRANGES @  Labs:   Estimated body mass index is 32.52 kg/m as calculated from the following:   Height as of 05/12/17: 5' (1.524 m).   Weight as of 05/12/17: 75.5 kg (166 lb 8 oz).   Imaging Review Plain radiographs demonstrate severe degenerative joint disease of the left knee(s). The overall alignment issignificant varus. The bone quality appears to be adequate for age and reported activity level.  Assessment/Plan:  End stage arthritis, left knee   The patient history, physical examination, clinical judgment of the provider and imaging studies are consistent with end stage degenerative joint disease of the left knee(s) and total knee arthroplasty is deemed medically necessary. The treatment options including medical management, injection therapy arthroscopy and arthroplasty were discussed at length. The risks and benefits of total knee arthroplasty were presented and reviewed. The risks due to aseptic loosening, infection, stiffness, patella tracking problems, thromboembolic complications and  other imponderables were discussed. The patient acknowledged the explanation, agreed to proceed with the plan and consent was signed. Patient is being admitted for inpatient treatment for surgery, pain control, PT, OT, prophylactic antibiotics, VTE prophylaxis, progressive ambulation and ADL's and discharge planning. The patient is planning to be discharged home with outpatient PT at [facility].

## 2017-05-13 NOTE — H&P (View-Only) (Signed)
TOTAL KNEE ADMISSION H&P  Patient is being admitted for left total knee arthroplasty.  Subjective:  Chief Complaint:left knee pain.  HPI: [name] [name], 72 y.o. female, has a history of pain and functional disability in the left knee due to arthritis and has failed non-surgical conservative treatments for greater than 12 weeks to includeNSAID's and/or analgesics, corticosteriod injections, flexibility and strengthening excercises, supervised PT with diminished ADL's post treatment, use of assistive devices, weight reduction as appropriate and activity modification.  Onset of symptoms was gradual, starting >10 years ago with gradually worsening course since that time. The patient noted no past surgery on the left knee(s).  Patient currently rates pain in the left knee(s) at 10 out of 10 with activity. Patient has night pain, worsening of pain with activity and weight bearing, pain that interferes with activities of daily living, pain with passive range of motion, crepitus and joint swelling.  Patient has evidence of subchondral cysts, subchondral sclerosis, periarticular osteophytes, joint subluxation and joint space narrowing by imaging studies. There is no active infection.  Patient Active Problem List   Diagnosis Date Noted  . Osteoarthritis of right knee 01/26/2017  . Leg cramps 07/02/2015  . Dyslipidemia, goal LDL below 70 10/21/2014  . S/P Bio-Bentall aortic root replacement with bioprosthetic valve conduit 10/27/2013  . S/P CABG x 1 10/27/2013  . Aortic insufficiency 10/10/2013  . Chronic diastolic CHF (congestive heart failure), NYHA class 2 ([place])   . Coronary artery disease 10/05/2013  . PVC (premature ventricular contraction) 03/30/2013  . Essential hypertension, benign 03/30/2013   Past Medical History:  Diagnosis Date  . Adenomatous colon polyp   . Aortic regurgitation    severe by TEE s/p pericardia tissue AVR  . Arthritis   . Cardiomyopathy ([place])    EF initally 25% but  now normalized  . Chronic diastolic CHF (congestive heart failure), NYHA class 2 ([place])   . Coronary artery disease 10/05/2013   Severe mid LAD stenosis s/p 1 vessel CABG with LIMA to LAD  . Diabetes mellitus without complication ([place])    DX 20 YRS AGO.   TYPE 2  . Dilated aortic root ([place])   . GERD (gastroesophageal reflux disease)    Tums prn  . Gout   . Gout   . Heart murmur   . Hypercholesteremia    LDL goal < 70  . Hypertension   . S/P Bio-Bentall aortic root replacement with bioprosthetic valve conduit 10/27/2013   23 mm [facility] Ease bovine pericardial tissue valve and 26 mm Vascutek gelweave Val-Salva aortic graft  . S/P CABG x 1 10/27/2013   LIMA to LAD  . Thyroiditis 10/2009   lab and u/s-thyroid function normalized in 9/11    Past Surgical History:  Procedure Laterality Date  . ABDOMINAL HYSTERECTOMY    . AORTIC VALVE REPLACEMENT N/A 10/27/2013   Procedure: AORTIC VALVE REPLACEMENT (AVR);  Surgeon: Rexene Alberts, MD;  Location: [place];  Service: Open Heart Surgery;  Laterality: N/A;  . ASCENDING AORTIC ROOT REPLACEMENT N/A 10/27/2013   Procedure: ASCENDING AORTIC ROOT REPLACEMENT;  Surgeon: Rexene Alberts, MD;  Location: [place];  Service: Open Heart Surgery;  Laterality: N/A;  . BUNIONECTOMY     bilateral  . CARDIAC CATHETERIZATION    . CARDIAC VALVE REPLACEMENT     AORTIC VALVE  . CARPAL TUNNEL RELEASE Right   . CATARACT EXTRACTION Bilateral   . COLONOSCOPY    . COLONOSCOPY WITH PROPOFOL N/A 05/08/2015   Procedure: COLONOSCOPY  WITH PROPOFOL;  Surgeon: Garlan Fair, MD;  Location: [facility] ENDOS[facility];  Service: Endoscopy;  Laterality: N/A;  . CORONARY ARTERY BYPASS GRAFT N/A 10/27/2013   Procedure: CORONARY ARTERY BYPASS GRAFTING (CABG) x 1 - LIMA to LAD;  Surgeon: Rexene Alberts, MD;  Location: [place];  Service: Open Heart Surgery;  Laterality: N/A;  . EYE SURGERY     BILATERAL CATARACTS  . INTRAOPERATIVE TRANSESOPHAGEAL ECHOCARDIOGRAM N/A 10/27/2013   Procedure:  INTRAOPERATIVE TRANSESOPHAGEAL ECHOCARDIOGRAM;  Surgeon: Rexene Alberts, MD;  Location: [place];  Service: Open Heart Surgery;  Laterality: N/A;  . KNEE ARTHROPLASTY Right 01/26/2017   Procedure: RIGHT TOTAL KNEE ARTHROPLASTY WITH COMPUTER NAVIGATION;  Surgeon: Rod Can, MD;  Location: [place];  Service: Orthopedics;  Laterality: Right;  Needs RNFA  . LEFT AND RIGHT HEART CATHETERIZATION WITH CORONARY ANGIOGRAM N/A 10/05/2013   Procedure: LEFT AND RIGHT HEART CATHETERIZATION WITH CORONARY ANGIOGRAM;  Surgeon: Blane Ohara, MD;  Location: [facility] CATH LAB;  Service: Cardiovascular;  Laterality: N/A;  . TEE WITHOUT CARDIOVERSION N/A 10/04/2013   Procedure: TRANSESOPHAGEAL ECHOCARDIOGRAM (TEE);  Surgeon: Sueanne Margarita, MD;  Location: [facility] ENDOSCOPY;  Service: Cardiovascular;  Laterality: N/A;  . tubular adenomatous polyp colonoscopy  12/31/2009    Current Outpatient Medications  Medication Sig Dispense Refill Last Dose  . aspirin EC 81 MG tablet Take 81 mg by mouth daily.   Taking  . atorvastatin (LIPITOR) 10 MG tablet TAKE 1 TABLET BY MOUTH EVERY DAY AT 6PM. Please make yearly appt with Dr. Radford Pax for March before anymore refills. 1st attempt 90 tablet 1 Taking  . Calcium-Vitamin D (CALTRATE 600 PLUS-VIT D PO) Take 1 tablet by mouth daily.   Taking  . carvedilol (COREG) 6.25 MG tablet Take 1 tablet (6.25 mg total) by mouth 2 (two) times daily with a meal. 180 tablet 2 Taking  . clindamycin (CLEOCIN) 150 MG capsule Take 4 capsules by mouth as needed. (Take one (1) hour prior to dental procedures)  2 Taking  . colchicine (COLCRYS) 0.6 MG tablet Take 0.6 mg by mouth daily.   Taking  . famotidine (PEPCID) 10 MG tablet Take 10 mg by mouth daily as needed for heartburn.    Taking  . GLUCOSAMINE-CHONDROITIN PO Take 1 tablet by mouth daily. Glucosamine 1200 mg, chondroitin 1500 mg   Taking  . losartan (COZAAR) 25 MG tablet Take 1 tablet (25 mg total) by mouth daily. 90 tablet 0 Taking  . metFORMIN  (GLUCOPHAGE-XR) 500 MG 24 hr tablet Take 1,000 mg by mouth at bedtime.    Taking  . Multiple Vitamin (MULTIVITAMIN WITH MINERALS) TABS tablet Take 1 tablet by mouth daily. Centrum Silver   Taking  . ONE TOUCH ULTRA TEST test strip    Taking  . pramipexole (MIRAPEX) 0.125 MG tablet Take 0.125 mg by mouth at bedtime.  6 Taking   No current facility-administered medications for this visit.    Allergies  Allergen Reactions  . Atenolol Shortness Of Breath and Other (See Comments)    Edema and Headache  . Lisinopril Cough  . Penicillin G   . Penicillins Other (See Comments)    Has patient had a PCN reaction causing immediate rash, facial/tongue/throat swelling, SOB or lightheadedness with hypotension: No Has patient had a PCN reaction causing severe rash involving mucus membranes or skin necrosis: No Has patient had a PCN reaction that required hospitalization No Has patient had a PCN reaction occurring within the last 10 years: No If all of the above  answers are "NO", then may proceed with Cephalosporin use.G Benzathine: Local injection rash  . Sulfa Antibiotics Hives and Rash    Social History   Tobacco Use  . Smoking status: Never Smoker  . Smokeless tobacco: Never Used  Substance Use Topics  . Alcohol use: No    Family History  Problem Relation Age of Onset  . Hypertension Mother   . Diabetes Mother   . Arthritis/Rheumatoid Mother   . Heart attack Father   . Stroke Sister   . Cancer Brother      Review of Systems  Constitutional: Negative.   HENT: Negative.   Eyes: Negative.   Respiratory: Negative.   Cardiovascular: Negative.   Gastrointestinal: Positive for heartburn.  Genitourinary: Negative.   Musculoskeletal: Positive for joint pain.  Skin: Negative.   Neurological: Negative.   Endo/Heme/Allergies: Negative.   Psychiatric/Behavioral: Negative.     Objective:  Physical Exam  Vitals reviewed. Constitutional: She is oriented to person, place, and time. She  appears well-developed and well-nourished.  HENT:  Head: Normocephalic and atraumatic.  Eyes: Conjunctivae and EOM are normal. Pupils are equal, round, and reactive to light.  Neck: Normal range of motion. Neck supple.  Cardiovascular: Normal rate, regular rhythm and intact distal pulses.  Respiratory: Effort normal. No respiratory distress.  GI: Soft. She exhibits no distension.  Genitourinary:  Genitourinary Comments: deferred  Musculoskeletal:       Left knee: She exhibits decreased range of motion, swelling, effusion, deformity and abnormal alignment. Tenderness found. Medial joint line and lateral joint line tenderness noted.  Neurological: She is alert and oriented to person, place, and time. She has normal reflexes.  Skin: Skin is warm and dry.  Psychiatric: She has a normal mood and affect. Her behavior is normal. Judgment and thought content normal.    Vital signs in last 24 hours: @VSRANGES @  Labs:   Estimated body mass index is 32.52 kg/m as calculated from the following:   Height as of 05/12/17: 5' (1.524 m).   Weight as of 05/12/17: 75.5 kg (166 lb 8 oz).   Imaging Review Plain radiographs demonstrate severe degenerative joint disease of the left knee(s). The overall alignment issignificant varus. The bone quality appears to be adequate for age and reported activity level.  Assessment/Plan:  End stage arthritis, left knee   The patient history, physical examination, clinical judgment of the provider and imaging studies are consistent with end stage degenerative joint disease of the left knee(s) and total knee arthroplasty is deemed medically necessary. The treatment options including medical management, injection therapy arthroscopy and arthroplasty were discussed at length. The risks and benefits of total knee arthroplasty were presented and reviewed. The risks due to aseptic loosening, infection, stiffness, patella tracking problems, thromboembolic complications and  other imponderables were discussed. The patient acknowledged the explanation, agreed to proceed with the plan and consent was signed. Patient is being admitted for inpatient treatment for surgery, pain control, PT, OT, prophylactic antibiotics, VTE prophylaxis, progressive ambulation and ADL's and discharge planning. The patient is planning to be discharged home with outpatient PT at [facility].

## 2017-05-15 ENCOUNTER — Other Ambulatory Visit: Payer: Self-pay | Admitting: Geriatric Medicine

## 2017-05-15 DIAGNOSIS — Z1231 Encounter for screening mammogram for malignant neoplasm of breast: Secondary | ICD-10-CM

## 2017-05-19 ENCOUNTER — Other Ambulatory Visit (HOSPITAL_COMMUNITY): Payer: Medicare Other

## 2017-05-20 NOTE — Progress Notes (Addendum)
05/11/2017-Last office visit and clearance and labs (BMP, CBC w/diff., HgA1C, microalbumin panel on chart from Dr. Felipa Eth  05/12/2017-noted in [place]  07/18/2015- noted in [place]

## 2017-05-20 NOTE — Patient Instructions (Addendum)
[name] [name]  05/20/2017   Your procedure is scheduled on: Thursday 05/28/2017  Report to [facility] Main  Entrance              Report to admitting at  0830  AM   Call this number if you have problems the morning of surgery [phone]   How to Manage Your Diabetes Before and After Surgery  Why is it important to control my blood sugar before and after surgery? . Improving blood sugar levels before and after surgery helps healing and can limit problems. . A way of improving blood sugar control is eating a healthy diet by: o  Eating less sugar and carbohydrates o  Increasing activity/exercise o  Talking with your doctor about reaching your blood sugar goals . High blood sugars (greater than 180 mg/dL) can raise your risk of infections and slow your recovery, so you will need to focus on controlling your diabetes during the weeks before surgery. . Make sure that the doctor who takes care of your diabetes knows about your planned surgery including the date and location.  How do I manage my blood sugar before surgery? . Check your blood sugar at least 4 times a day, starting 2 days before surgery, to make sure that the level is not too high or low. o Check your blood sugar the morning of your surgery when you wake up and every 2 hours until you get to the Short Stay unit. . If your blood sugar is less than 70 mg/dL, you will need to treat for low blood sugar: o Do not take insulin. o Treat a low blood sugar (less than 70 mg/dL) with  cup of clear juice (cranberry or apple), 4 glucose tablets, OR glucose gel. o Recheck blood sugar in 15 minutes after treatment (to make sure it is greater than 70 mg/dL). If your blood sugar is not greater than 70 mg/dL on recheck, call [phone] for further instructions. . Report your blood sugar to the short stay nurse when you get to Short Stay.  . If you are admitted to the hospital after surgery: o Your blood sugar  will be checked by the staff and you will probably be given insulin after surgery (instead of oral diabetes medicines) to make sure you have good blood sugar levels. o The goal for blood sugar control after surgery is 80-180 mg/dL.   WHAT DO I DO ABOUT MY DIABETES MEDICATION?  Take Metformin the day before surgery on 05/27/2017 as usual.  . Do not take oral diabetes medicines (pills) the morning of surgery.         Remember: Do not eat food or drink liquids :After Midnight.     Take these medicines the morning of surgery with A SIP OF WATER: Carvedilol (Coreg), Flonase nasal spray, Atorvastatin (Lipitor), Rantidine (Zantac) if needed  DO NOT TAKE ANY DIABETIC MEDICATIONS DAY OF YOUR SURGERY                               You may not have any metal on your body including hair pins and              piercings  Do not wear jewelry, make-up, lotions, powders or perfumes, deodorant             Do not wear nail polish.  Do not shave  48 hours prior to surgery.              Men may shave face and neck.   Do not bring valuables to the hospital. [place].  Contacts, dentures or bridgework may not be worn into surgery.  Leave suitcase in the car. After surgery it may be brought to your room.                  Please read over the following fact sheets you were given: _____________________________________________________________________             [facility] - Preparing for Surgery Before surgery, you can play an important role.  Because skin is not sterile, your skin needs to be as free of germs as possible.  You can reduce the number of germs on your skin by washing with CHG (chlorahexidine gluconate) soap before surgery.  CHG is an antiseptic cleaner which kills germs and bonds with the skin to continue killing germs even after washing. Please DO NOT use if you have an allergy to CHG or antibacterial soaps.  If your skin becomes  reddened/irritated stop using the CHG and inform your nurse when you arrive at Short Stay. Do not shave (including legs and underarms) for at least 48 hours prior to the first CHG shower.  You may shave your face/neck. Please follow these instructions carefully:  1.  Shower with CHG Soap the night before surgery and the  morning of Surgery.  2.  If you choose to wash your hair, wash your hair first as usual with your  normal  shampoo.  3.  After you shampoo, rinse your hair and body thoroughly to remove the  shampoo.                           4.  Use CHG as you would any other liquid soap.  You can apply chg directly  to the skin and wash                       Gently with a scrungie or clean washcloth.  5.  Apply the CHG Soap to your body ONLY FROM THE NECK DOWN.   Do not use on face/ open                           Wound or open sores. Avoid contact with eyes, ears mouth and genitals (private parts).                       Wash face,  Genitals (private parts) with your normal soap.             6.  Wash thoroughly, paying special attention to the area where your surgery  will be performed.  7.  Thoroughly rinse your body with warm water from the neck down.  8.  DO NOT shower/wash with your normal soap after using and rinsing off  the CHG Soap.                9.  Pat yourself dry with a clean towel.            10.  Wear clean pajamas.  11.  Place clean sheets on your bed the night of your first shower and do not  sleep with pets. Day of Surgery : Do not apply any lotions/deodorants the morning of surgery.  Please wear clean clothes to the hospital/surgery center.  FAILURE TO FOLLOW THESE INSTRUCTIONS MAY RESULT IN THE CANCELLATION OF YOUR SURGERY PATIENT SIGNATURE_________________________________  NURSE SIGNATURE__________________________________  ________________________________________________________________________   [name] [name]  An incentive spirometer is a tool that  can help keep your lungs clear and active. This tool measures how well you are filling your lungs with each breath. Taking long deep breaths may help reverse or decrease the chance of developing breathing (pulmonary) problems (especially infection) following:  A long period of time when you are unable to move or be active. BEFORE THE PROCEDURE   If the spirometer includes an indicator to show your best effort, your nurse or respiratory therapist will set it to a desired goal.  If possible, sit up straight or lean slightly forward. Try not to slouch.  Hold the incentive spirometer in an upright position. INSTRUCTIONS FOR USE  1. Sit on the edge of your bed if possible, or sit up as far as you can in bed or on a chair. 2. Hold the incentive spirometer in an upright position. 3. Breathe out normally. 4. Place the mouthpiece in your mouth and seal your lips tightly around it. 5. Breathe in slowly and as deeply as possible, raising the piston or the ball toward the top of the column. 6. Hold your breath for 3-5 seconds or for as long as possible. Allow the piston or ball to fall to the bottom of the column. 7. Remove the mouthpiece from your mouth and breathe out normally. 8. Rest for a few seconds and repeat Steps 1 through 7 at least 10 times every 1-2 hours when you are awake. Take your time and take a few normal breaths between deep breaths. 9. The spirometer may include an indicator to show your best effort. Use the indicator as a goal to work toward during each repetition. 10. After each set of 10 deep breaths, practice coughing to be sure your lungs are clear. If you have an incision (the cut made at the time of surgery), support your incision when coughing by placing a pillow or rolled up towels firmly against it. Once you are able to get out of bed, walk around indoors and cough well. You may stop using the incentive spirometer when instructed by your caregiver.  RISKS AND  COMPLICATIONS  Take your time so you do not get dizzy or light-headed.  If you are in pain, you may need to take or ask for pain medication before doing incentive spirometry. It is harder to take a deep breath if you are having pain. AFTER USE  Rest and breathe slowly and easily.  It can be helpful to keep track of a log of your progress. Your caregiver can provide you with a simple table to help with this. If you are using the spirometer at home, follow these instructions: [place] IF:   You are having difficultly using the spirometer.  You have trouble using the spirometer as often as instructed.  Your pain medication is not giving enough relief while using the spirometer.  You develop fever of 100.5 F (38.1 C) or higher. SEEK IMMEDIATE MEDICAL CARE IF:   You cough up bloody sputum that had not been present before.  You develop fever of 102 F (38.9 C)  or greater.  You develop worsening pain at or near the incision site. MAKE SURE YOU:   Understand these instructions.  Will watch your condition.  Will get help right away if you are not doing well or get worse. Document Released: 08/04/2006 Document Revised: 06/16/2011 Document Reviewed: 10/05/2006 Exit[employer] Patient Information 2014 [employer], [place].   ________________________________________________________________________  WHAT IS A BLOOD TRANSFUSION? Blood Transfusion Information  A transfusion is the replacement of blood or some of its parts. Blood is made up of multiple cells which provide different functions.  Red blood cells carry oxygen and are used for blood loss replacement.  White blood cells fight against infection.  Platelets control bleeding.  Plasma helps clot blood.  Other blood products are available for specialized needs, such as hemophilia or other clotting disorders. BEFORE THE TRANSFUSION  Who gives blood for transfusions?   Healthy volunteers who are fully evaluated to make sure  their blood is safe. This is blood bank blood. Transfusion therapy is the safest it has ever been in the practice of medicine. Before blood is taken from a donor, a complete history is taken to make sure that person has no history of diseases nor engages in risky social behavior (examples are intravenous drug use or sexual activity with multiple partners). The donor's travel history is screened to minimize risk of transmitting infections, such as malaria. The donated blood is tested for signs of infectious diseases, such as HIV and hepatitis. The blood is then tested to be sure it is compatible with you in order to minimize the chance of a transfusion reaction. If you or a relative donates blood, this is often done in anticipation of surgery and is not appropriate for emergency situations. It takes many days to process the donated blood. RISKS AND COMPLICATIONS Although transfusion therapy is very safe and saves many lives, the main dangers of transfusion include:   Getting an infectious disease.  Developing a transfusion reaction. This is an allergic reaction to something in the blood you were given. Every precaution is taken to prevent this. The decision to have a blood transfusion has been considered carefully by your caregiver before blood is given. Blood is not given unless the benefits outweigh the risks. AFTER THE TRANSFUSION  Right after receiving a blood transfusion, you will usually feel much better and more energetic. This is especially true if your red blood cells have gotten low (anemic). The transfusion raises the level of the red blood cells which carry oxygen, and this usually causes an energy increase.  The nurse administering the transfusion will monitor you carefully for complications. HOME CARE INSTRUCTIONS  No special instructions are needed after a transfusion. You may find your energy is better. Speak with your caregiver about any limitations on activity for underlying diseases  you may have. SEEK MEDICAL CARE IF:   Your condition is not improving after your transfusion.  You develop redness or irritation at the intravenous (IV) site. SEEK IMMEDIATE MEDICAL CARE IF:  Any of the following symptoms occur over the next 12 hours:  Shaking chills.  You have a temperature by mouth above 102 F (38.9 C), not controlled by medicine.  Chest, back, or muscle pain.  People around you feel you are not acting correctly or are confused.  Shortness of breath or difficulty breathing.  Dizziness and fainting.  You get a rash or develop hives.  You have a decrease in urine output.  Your urine turns a dark color or changes to pink, red,  or brown. Any of the following symptoms occur over the next 10 days:  You have a temperature by mouth above 102 F (38.9 C), not controlled by medicine.  Shortness of breath.  Weakness after normal activity.  The white part of the eye turns yellow (jaundice).  You have a decrease in the amount of urine or are urinating less often.  Your urine turns a dark color or changes to pink, red, or brown. Document Released: 03/21/2000 Document Revised: 06/16/2011 Document Reviewed: 11/08/2007 [facility] Patient Information 2014 [place], [place].  _______________________________________________________________________

## 2017-05-25 ENCOUNTER — Encounter (HOSPITAL_COMMUNITY)
Admission: RE | Admit: 2017-05-25 | Discharge: 2017-05-25 | Disposition: A | Discharge status: Home / Self Care | Payer: Medicare Other | Source: Ambulatory Visit | Attending: Orthopedic Surgery | Admitting: Orthopedic Surgery

## 2017-05-25 ENCOUNTER — Encounter (HOSPITAL_COMMUNITY): Payer: Self-pay

## 2017-05-25 ENCOUNTER — Other Ambulatory Visit: Payer: Self-pay

## 2017-05-25 LAB — GLUCOSE, CAPILLARY: Glucose-Capillary: 150 mg/dL — ABNORMAL HIGH (ref 65–99)

## 2017-05-27 MED ORDER — TRANEXAMIC ACID 1000 MG/10ML IV SOLN
1000.0000 mg | INTRAVENOUS | Status: AC
  Administered 2017-05-28: 1000 mg via INTRAVENOUS
  Filled 2017-05-27: qty 1100

## 2017-05-28 ENCOUNTER — Inpatient Hospital Stay (HOSPITAL_COMMUNITY)
Admission: RE | Admit: 2017-05-28 | Discharge: 2017-05-31 | DRG: 470 | Disposition: A | Discharge status: Home / Self Care | Payer: Medicare Other | Source: Ambulatory Visit | Attending: Orthopedic Surgery | Admitting: Orthopedic Surgery

## 2017-05-28 ENCOUNTER — Encounter (HOSPITAL_COMMUNITY): Payer: Self-pay | Admitting: Anesthesiology

## 2017-05-28 ENCOUNTER — Inpatient Hospital Stay (HOSPITAL_COMMUNITY): Payer: Medicare Other

## 2017-05-28 ENCOUNTER — Inpatient Hospital Stay (HOSPITAL_COMMUNITY): Payer: Medicare Other | Admitting: Anesthesiology

## 2017-05-28 ENCOUNTER — Other Ambulatory Visit: Payer: Self-pay

## 2017-05-28 ENCOUNTER — Encounter (HOSPITAL_COMMUNITY)
Admission: RE | Disposition: A | Discharge status: Home / Self Care | Payer: Self-pay | Source: Ambulatory Visit | Attending: Orthopedic Surgery

## 2017-05-28 DIAGNOSIS — Z951 Presence of aortocoronary bypass graft: Secondary | ICD-10-CM

## 2017-05-28 DIAGNOSIS — E785 Hyperlipidemia, unspecified: Secondary | ICD-10-CM | POA: Diagnosis present

## 2017-05-28 DIAGNOSIS — M109 Gout, unspecified: Secondary | ICD-10-CM | POA: Diagnosis present

## 2017-05-28 DIAGNOSIS — E1165 Type 2 diabetes mellitus with hyperglycemia: Secondary | ICD-10-CM | POA: Diagnosis not present

## 2017-05-28 DIAGNOSIS — Z90710 Acquired absence of both cervix and uterus: Secondary | ICD-10-CM | POA: Diagnosis not present

## 2017-05-28 DIAGNOSIS — Z96652 Presence of left artificial knee joint: Secondary | ICD-10-CM

## 2017-05-28 DIAGNOSIS — Z96651 Presence of right artificial knee joint: Secondary | ICD-10-CM | POA: Diagnosis present

## 2017-05-28 DIAGNOSIS — Z95828 Presence of other vascular implants and grafts: Secondary | ICD-10-CM

## 2017-05-28 DIAGNOSIS — Z8249 Family history of ischemic heart disease and other diseases of the circulatory system: Secondary | ICD-10-CM | POA: Diagnosis not present

## 2017-05-28 DIAGNOSIS — Z7984 Long term (current) use of oral hypoglycemic drugs: Secondary | ICD-10-CM

## 2017-05-28 DIAGNOSIS — Z881 Allergy status to other antibiotic agents status: Secondary | ICD-10-CM

## 2017-05-28 DIAGNOSIS — I959 Hypotension, unspecified: Secondary | ICD-10-CM | POA: Diagnosis not present

## 2017-05-28 DIAGNOSIS — M25562 Pain in left knee: Secondary | ICD-10-CM | POA: Diagnosis present

## 2017-05-28 DIAGNOSIS — Z888 Allergy status to other drugs, medicaments and biological substances status: Secondary | ICD-10-CM | POA: Diagnosis not present

## 2017-05-28 DIAGNOSIS — Z79899 Other long term (current) drug therapy: Secondary | ICD-10-CM | POA: Diagnosis not present

## 2017-05-28 DIAGNOSIS — Z7982 Long term (current) use of aspirin: Secondary | ICD-10-CM | POA: Diagnosis not present

## 2017-05-28 DIAGNOSIS — Z880 Allergy status to penicillin: Secondary | ICD-10-CM

## 2017-05-28 DIAGNOSIS — Z953 Presence of xenogenic heart valve: Secondary | ICD-10-CM

## 2017-05-28 DIAGNOSIS — I2510 Atherosclerotic heart disease of native coronary artery without angina pectoris: Secondary | ICD-10-CM | POA: Diagnosis present

## 2017-05-28 DIAGNOSIS — I110 Hypertensive heart disease with heart failure: Secondary | ICD-10-CM | POA: Diagnosis present

## 2017-05-28 DIAGNOSIS — I5032 Chronic diastolic (congestive) heart failure: Secondary | ICD-10-CM | POA: Diagnosis present

## 2017-05-28 DIAGNOSIS — K219 Gastro-esophageal reflux disease without esophagitis: Secondary | ICD-10-CM | POA: Diagnosis present

## 2017-05-28 DIAGNOSIS — M1712 Unilateral primary osteoarthritis, left knee: Secondary | ICD-10-CM | POA: Diagnosis present

## 2017-05-28 DIAGNOSIS — E7800 Pure hypercholesterolemia, unspecified: Secondary | ICD-10-CM | POA: Diagnosis present

## 2017-05-28 HISTORY — PX: KNEE ARTHROPLASTY: SHX992

## 2017-05-28 LAB — GLUCOSE, CAPILLARY
Glucose-Capillary: 178 mg/dL — ABNORMAL HIGH (ref 65–99)
Glucose-Capillary: 310 mg/dL — ABNORMAL HIGH (ref 65–99)
Glucose-Capillary: 389 mg/dL — ABNORMAL HIGH (ref 65–99)

## 2017-05-28 LAB — TYPE AND SCREEN
ABO/RH(D): O NEG
Antibody Screen: POSITIVE

## 2017-05-28 SURGERY — ARTHROPLASTY, KNEE, TOTAL, USING IMAGELESS COMPUTER-ASSISTED NAVIGATION
Anesthesia: Spinal | Site: Knee | Laterality: Left

## 2017-05-28 MED ORDER — STERILE WATER FOR IRRIGATION IR SOLN
Status: DC | PRN
  Administered 2017-05-28: 2000 mL

## 2017-05-28 MED ORDER — COLCHICINE 0.6 MG PO TABS
0.6000 mg | ORAL_TABLET | Freq: Every day | ORAL | Status: DC
  Administered 2017-05-29 – 2017-05-31 (×3): 0.6 mg via ORAL
  Filled 2017-05-28 (×3): qty 1

## 2017-05-28 MED ORDER — SODIUM CHLORIDE 0.9 % IR SOLN
Status: DC | PRN
  Administered 2017-05-28: 3000 mL

## 2017-05-28 MED ORDER — SODIUM CHLORIDE 0.9 % IV SOLN
INTRAVENOUS | Status: DC
  Administered 2017-05-28: 16:00:00 via INTRAVENOUS

## 2017-05-28 MED ORDER — METOCLOPRAMIDE HCL 5 MG/ML IJ SOLN: 5.0000 mg | Freq: Three times a day (TID) | INTRAMUSCULAR | Status: DC | PRN

## 2017-05-28 MED ORDER — INSULIN ASPART 100 UNIT/ML ~~LOC~~ SOLN
5.0000 [IU] | Freq: Once | SUBCUTANEOUS | Status: AC
  Administered 2017-05-28: 23:00:00 5 [IU] via SUBCUTANEOUS

## 2017-05-28 MED ORDER — MIDAZOLAM HCL 2 MG/2ML IJ SOLN
2.0000 mg | Freq: Once | INTRAMUSCULAR | Status: AC
  Administered 2017-05-28: 1 mg via INTRAVENOUS
  Filled 2017-05-28: qty 2

## 2017-05-28 MED ORDER — ACETAMINOPHEN 325 MG PO TABS: 650.0000 mg | ORAL_TABLET | ORAL | Status: DC | PRN

## 2017-05-28 MED ORDER — ASPIRIN 81 MG PO CHEW
81.0000 mg | CHEWABLE_TABLET | Freq: Two times a day (BID) | ORAL | Status: DC
  Administered 2017-05-28 – 2017-05-31 (×6): 81 mg via ORAL
  Filled 2017-05-28 (×6): qty 1

## 2017-05-28 MED ORDER — SENNA 8.6 MG PO TABS
2.0000 | ORAL_TABLET | Freq: Every day | ORAL | Status: DC
  Administered 2017-05-28 – 2017-05-30 (×3): 17.2 mg via ORAL
  Filled 2017-05-28 (×3): qty 2

## 2017-05-28 MED ORDER — LACTATED RINGERS IV SOLN
INTRAVENOUS | Status: DC
  Administered 2017-05-28 (×3): via INTRAVENOUS

## 2017-05-28 MED ORDER — METFORMIN HCL ER 500 MG PO TB24
1000.0000 mg | ORAL_TABLET | Freq: Every day | ORAL | Status: DC
  Administered 2017-05-28 – 2017-05-30 (×3): 1000 mg via ORAL
  Filled 2017-05-28 (×3): qty 2

## 2017-05-28 MED ORDER — HYDROMORPHONE HCL 1 MG/ML IJ SOLN
0.5000 mg | INTRAMUSCULAR | Status: DC | PRN
  Administered 2017-05-28: 1 mg via INTRAVENOUS
  Filled 2017-05-28: qty 1

## 2017-05-28 MED ORDER — PROPOFOL 500 MG/50ML IV EMUL
INTRAVENOUS | Status: DC | PRN
  Administered 2017-05-28: 50 ug/kg/min via INTRAVENOUS

## 2017-05-28 MED ORDER — POLYETHYLENE GLYCOL 3350 17 G PO PACK
17.0000 g | PACK | Freq: Every day | ORAL | Status: DC | PRN
Start: 2017-05-28 — End: 2017-05-31

## 2017-05-28 MED ORDER — ALUM & MAG HYDROXIDE-SIMETH 200-200-20 MG/5ML PO SUSP: 30.0000 mL | ORAL | Status: DC | PRN

## 2017-05-28 MED ORDER — METHOCARBAMOL 500 MG PO TABS
500.0000 mg | ORAL_TABLET | Freq: Four times a day (QID) | ORAL | Status: DC | PRN
  Administered 2017-05-28 – 2017-05-31 (×3): 500 mg via ORAL
  Filled 2017-05-28 (×4): qty 1

## 2017-05-28 MED ORDER — METOCLOPRAMIDE HCL 5 MG PO TABS: 5.0000 mg | ORAL_TABLET | Freq: Three times a day (TID) | ORAL | Status: DC | PRN

## 2017-05-28 MED ORDER — DOCUSATE SODIUM 100 MG PO CAPS
100.0000 mg | ORAL_CAPSULE | Freq: Two times a day (BID) | ORAL | Status: DC
  Administered 2017-05-28 – 2017-05-31 (×6): 100 mg via ORAL
  Filled 2017-05-28 (×6): qty 1

## 2017-05-28 MED ORDER — FLUTICASONE PROPIONATE 50 MCG/ACT NA SUSP
1.0000 | Freq: Two times a day (BID) | NASAL | Status: DC
  Administered 2017-05-28 – 2017-05-31 (×6): 1 via NASAL
  Filled 2017-05-28: qty 16

## 2017-05-28 MED ORDER — PROPOFOL 10 MG/ML IV BOLUS
INTRAVENOUS | Status: AC
  Filled 2017-05-28: qty 20

## 2017-05-28 MED ORDER — DEXAMETHASONE SODIUM PHOSPHATE 10 MG/ML IJ SOLN
INTRAMUSCULAR | Status: DC | PRN
  Administered 2017-05-28: 10 mg via INTRAVENOUS

## 2017-05-28 MED ORDER — PRAMIPEXOLE DIHYDROCHLORIDE 0.25 MG PO TABS
0.1250 mg | ORAL_TABLET | Freq: Every day | ORAL | Status: DC
  Administered 2017-05-29: 23:00:00 0.125 mg via ORAL
  Filled 2017-05-28 (×2): qty 0.5

## 2017-05-28 MED ORDER — ACETAMINOPHEN 650 MG RE SUPP: 650.0000 mg | RECTAL | Status: DC | PRN

## 2017-05-28 MED ORDER — KETOROLAC TROMETHAMINE 30 MG/ML IJ SOLN
INTRAMUSCULAR | Status: DC | PRN
  Administered 2017-05-28: 30 mg

## 2017-05-28 MED ORDER — HYDROMORPHONE HCL 1 MG/ML IJ SOLN
INTRAMUSCULAR | Status: AC
  Administered 2017-05-28: 0.5 mg via INTRAVENOUS
  Filled 2017-05-28: qty 1

## 2017-05-28 MED ORDER — ONDANSETRON HCL 4 MG PO TABS: 4.0000 mg | ORAL_TABLET | Freq: Four times a day (QID) | ORAL | Status: DC | PRN

## 2017-05-28 MED ORDER — ATORVASTATIN CALCIUM 10 MG PO TABS
10.0000 mg | ORAL_TABLET | Freq: Every day | ORAL | Status: DC
  Administered 2017-05-29 – 2017-05-30 (×2): 10 mg via ORAL
  Filled 2017-05-28 (×2): qty 1

## 2017-05-28 MED ORDER — PHENOL 1.4 % MT LIQD: 1.0000 | OROMUCOSAL | Status: DC | PRN

## 2017-05-28 MED ORDER — HYDROCODONE-ACETAMINOPHEN 5-325 MG PO TABS
2.0000 | ORAL_TABLET | ORAL | Status: DC | PRN
  Administered 2017-05-30 – 2017-05-31 (×4): 2 via ORAL
  Filled 2017-05-28 (×3): qty 2

## 2017-05-28 MED ORDER — SODIUM CHLORIDE 0.9 % IJ SOLN
INTRAMUSCULAR | Status: DC | PRN
  Administered 2017-05-28: 30 mL

## 2017-05-28 MED ORDER — CHLORHEXIDINE GLUCONATE 4 % EX LIQD: 60.0000 mL | Freq: Once | CUTANEOUS | Status: DC

## 2017-05-28 MED ORDER — SODIUM CHLORIDE 0.9 % IJ SOLN
INTRAMUSCULAR | Status: AC
  Filled 2017-05-28: qty 50

## 2017-05-28 MED ORDER — SODIUM CHLORIDE 0.9 % IR SOLN
Status: DC | PRN
  Administered 2017-05-28: 1000 mL

## 2017-05-28 MED ORDER — LOSARTAN POTASSIUM 25 MG PO TABS
25.0000 mg | ORAL_TABLET | Freq: Every day | ORAL | Status: DC
  Filled 2017-05-28 (×3): qty 1

## 2017-05-28 MED ORDER — CARVEDILOL 6.25 MG PO TABS
6.2500 mg | ORAL_TABLET | Freq: Two times a day (BID) | ORAL | Status: DC
  Administered 2017-05-28 – 2017-05-29 (×3): 6.25 mg via ORAL
  Filled 2017-05-28 (×6): qty 1

## 2017-05-28 MED ORDER — ACETAMINOPHEN 10 MG/ML IV SOLN
1000.0000 mg | INTRAVENOUS | Status: AC
  Administered 2017-05-28: 1000 mg via INTRAVENOUS
  Filled 2017-05-28: qty 100

## 2017-05-28 MED ORDER — SODIUM CHLORIDE 0.9 % IV SOLN
INTRAVENOUS | Status: DC | PRN
  Administered 2017-05-28: 20 ug/min via INTRAVENOUS

## 2017-05-28 MED ORDER — CLINDAMYCIN PHOSPHATE 600 MG/50ML IV SOLN
600.0000 mg | Freq: Four times a day (QID) | INTRAVENOUS | Status: AC
Start: 2017-05-28 — End: 2017-05-29
  Administered 2017-05-28 (×2): 600 mg via INTRAVENOUS
  Filled 2017-05-28 (×2): qty 50

## 2017-05-28 MED ORDER — FAMOTIDINE 20 MG PO TABS
20.0000 mg | ORAL_TABLET | Freq: Every day | ORAL | Status: DC
  Administered 2017-05-29 – 2017-05-31 (×3): 20 mg via ORAL
  Filled 2017-05-28 (×3): qty 1

## 2017-05-28 MED ORDER — METHOCARBAMOL 1000 MG/10ML IJ SOLN
500.0000 mg | Freq: Four times a day (QID) | INTRAVENOUS | Status: DC | PRN
  Administered 2017-05-28: 500 mg via INTRAVENOUS
  Filled 2017-05-28: qty 550

## 2017-05-28 MED ORDER — SODIUM CHLORIDE 0.9 % IV SOLN: INTRAVENOUS | Status: DC

## 2017-05-28 MED ORDER — HYDROMORPHONE HCL 1 MG/ML IJ SOLN
0.2500 mg | INTRAMUSCULAR | Status: DC | PRN
  Administered 2017-05-28 (×2): 0.5 mg via INTRAVENOUS

## 2017-05-28 MED ORDER — INSULIN ASPART 100 UNIT/ML ~~LOC~~ SOLN
0.0000 [IU] | Freq: Three times a day (TID) | SUBCUTANEOUS | Status: DC
  Administered 2017-05-29: 5 [IU] via SUBCUTANEOUS
  Administered 2017-05-29 – 2017-05-30 (×2): 3 [IU] via SUBCUTANEOUS
  Administered 2017-05-30: 09:00:00 2 [IU] via SUBCUTANEOUS
  Administered 2017-05-30 – 2017-05-31 (×2): 3 [IU] via SUBCUTANEOUS

## 2017-05-28 MED ORDER — FENTANYL CITRATE (PF) 100 MCG/2ML IJ SOLN
100.0000 ug | Freq: Once | INTRAMUSCULAR | Status: AC
  Administered 2017-05-28: 50 ug via INTRAVENOUS
  Filled 2017-05-28: qty 2

## 2017-05-28 MED ORDER — BUPIVACAINE-EPINEPHRINE 0.25% -1:200000 IJ SOLN
INTRAMUSCULAR | Status: AC
  Filled 2017-05-28: qty 1

## 2017-05-28 MED ORDER — CLINDAMYCIN PHOSPHATE 900 MG/50ML IV SOLN
900.0000 mg | INTRAVENOUS | Status: AC
  Administered 2017-05-28: 900 mg via INTRAVENOUS
  Filled 2017-05-28: qty 50

## 2017-05-28 MED ORDER — KETOROLAC TROMETHAMINE 30 MG/ML IJ SOLN
INTRAMUSCULAR | Status: AC
  Filled 2017-05-28: qty 1

## 2017-05-28 MED ORDER — ONDANSETRON HCL 4 MG/2ML IJ SOLN
INTRAMUSCULAR | Status: DC | PRN
  Administered 2017-05-28: 4 mg via INTRAVENOUS

## 2017-05-28 MED ORDER — ROPIVACAINE HCL 7.5 MG/ML IJ SOLN
INTRAMUSCULAR | Status: DC | PRN
  Administered 2017-05-28: 20 mL via PERINEURAL

## 2017-05-28 MED ORDER — HYDROCODONE-ACETAMINOPHEN 5-325 MG PO TABS
1.0000 | ORAL_TABLET | ORAL | Status: DC | PRN
  Administered 2017-05-28 – 2017-05-30 (×4): 1 via ORAL
  Filled 2017-05-28 (×6): qty 1

## 2017-05-28 MED ORDER — ONDANSETRON HCL 4 MG/2ML IJ SOLN: 4.0000 mg | Freq: Four times a day (QID) | INTRAMUSCULAR | Status: DC | PRN

## 2017-05-28 MED ORDER — ISOPROPYL ALCOHOL 70 % SOLN
Status: DC | PRN
  Administered 2017-05-28: 1 via TOPICAL

## 2017-05-28 MED ORDER — DIPHENHYDRAMINE HCL 12.5 MG/5ML PO ELIX: 12.5000 mg | ORAL_SOLUTION | ORAL | Status: DC | PRN

## 2017-05-28 MED ORDER — POVIDONE-IODINE 10 % EX SWAB
2.0000 "application " | Freq: Once | CUTANEOUS | Status: AC
  Administered 2017-05-28: 2 via TOPICAL

## 2017-05-28 MED ORDER — BUPIVACAINE-EPINEPHRINE 0.25% -1:200000 IJ SOLN
INTRAMUSCULAR | Status: DC | PRN
  Administered 2017-05-28: 30 mL

## 2017-05-28 MED ORDER — PROMETHAZINE HCL 25 MG/ML IJ SOLN: 6.2500 mg | INTRAMUSCULAR | Status: DC | PRN

## 2017-05-28 MED ORDER — MENTHOL 3 MG MT LOZG: 1.0000 | LOZENGE | OROMUCOSAL | Status: DC | PRN

## 2017-05-28 MED ORDER — KETOROLAC TROMETHAMINE 15 MG/ML IJ SOLN
7.5000 mg | Freq: Four times a day (QID) | INTRAMUSCULAR | Status: AC
  Administered 2017-05-28 – 2017-05-29 (×3): 7.5 mg via INTRAVENOUS
  Filled 2017-05-28 (×3): qty 1

## 2017-05-28 MED ORDER — DEXAMETHASONE SODIUM PHOSPHATE 10 MG/ML IJ SOLN
10.0000 mg | Freq: Once | INTRAMUSCULAR | Status: AC
  Administered 2017-05-29: 10 mg via INTRAVENOUS
  Filled 2017-05-28: qty 1

## 2017-05-28 MED ORDER — BUPIVACAINE IN DEXTROSE 0.75-8.25 % IT SOLN
INTRATHECAL | Status: DC | PRN
  Administered 2017-05-28: 1.6 mL via INTRATHECAL

## 2017-05-28 SURGICAL SUPPLY — 64 items
BAG ZIPLOCK 12X15 (MISCELLANEOUS) ×2 IMPLANT
BANDAGE ACE 4X5 VEL STRL LF (GAUZE/BANDAGES/DRESSINGS) ×2 IMPLANT
BANDAGE ACE 6X5 VEL STRL LF (GAUZE/BANDAGES/DRESSINGS) ×2 IMPLANT
BLADE SAW RECIPROCATING 77.5 (BLADE) ×2 IMPLANT
BONE CEMENT SIMPLEX TOBRAMYCIN (Cement) ×1 IMPLANT
BOWL SMART MIX CTS (DISPOSABLE) ×2 IMPLANT
CAPT KNEE TRIATH TK-4 ×2 IMPLANT
CEMENT BONE SIMPLEX TOBRAMYCIN (Cement) ×1 IMPLANT
CHLORAPREP W/TINT 26ML (MISCELLANEOUS) ×4 IMPLANT
COVER SURGICAL LIGHT HANDLE (MISCELLANEOUS) ×2 IMPLANT
CUFF TOURN SGL QUICK 34 (TOURNIQUET CUFF) ×1
CUFF TRNQT CYL 34X4X40X1 (TOURNIQUET CUFF) ×1 IMPLANT
DECANTER SPIKE VIAL GLASS SM (MISCELLANEOUS) ×4 IMPLANT
DERMABOND ADVANCED (GAUZE/BANDAGES/DRESSINGS) ×2
DERMABOND ADVANCED .7 DNX12 (GAUZE/BANDAGES/DRESSINGS) ×2 IMPLANT
DRAPE SHEET LG 3/4 BI-LAMINATE (DRAPES) ×4 IMPLANT
DRAPE U-SHAPE 47X51 STRL (DRAPES) ×2 IMPLANT
DRSG AQUACEL AG ADV 3.5X10 (GAUZE/BANDAGES/DRESSINGS) ×2 IMPLANT
DRSG TEGADERM 4X4.75 (GAUZE/BANDAGES/DRESSINGS) IMPLANT
ELECT BLADE TIP CTD 4 INCH (ELECTRODE) ×2 IMPLANT
ELECT REM PT RETURN 15FT ADLT (MISCELLANEOUS) ×2 IMPLANT
EVACUATOR 1/8 PVC DRAIN (DRAIN) IMPLANT
GAUZE SPONGE 4X4 12PLY STRL (GAUZE/BANDAGES/DRESSINGS) ×2 IMPLANT
GLOVE BIO SURGEON STRL SZ8.5 (GLOVE) ×4 IMPLANT
GLOVE BIOGEL M 7.0 STRL (GLOVE) ×4 IMPLANT
GLOVE BIOGEL PI IND STRL 7.5 (GLOVE) ×1 IMPLANT
GLOVE BIOGEL PI IND STRL 8.5 (GLOVE) ×1 IMPLANT
GLOVE BIOGEL PI INDICATOR 7.5 (GLOVE) ×1
GLOVE BIOGEL PI INDICATOR 8.5 (GLOVE) ×1
GOWN SPEC L3 XXLG W/TWL (GOWN DISPOSABLE) ×2 IMPLANT
HANDPIECE INTERPULSE COAX TIP (DISPOSABLE) ×1
HOOD PEEL AWAY FLYTE STAYCOOL (MISCELLANEOUS) ×6 IMPLANT
KNEE PATELLA ASYMMETRIC 9X29 (Knees) ×2 IMPLANT
MARKER SKIN DUAL TIP RULER LAB (MISCELLANEOUS) ×2 IMPLANT
NEEDLE SPNL 18GX3.5 QUINCKE PK (NEEDLE) ×2 IMPLANT
NS IRRIG 1000ML POUR BTL (IV SOLUTION) ×2 IMPLANT
PACK TOTAL KNEE CUSTOM (KITS) ×2 IMPLANT
PADDING CAST COTTON 6X4 STRL (CAST SUPPLIES) ×2 IMPLANT
POSITIONER SURGICAL ARM (MISCELLANEOUS) ×2 IMPLANT
SAW OSC TIP CART 19.5X105X1.3 (SAW) ×2 IMPLANT
SEALER BIPOLAR AQUA 6.0 (INSTRUMENTS) ×2 IMPLANT
SET HNDPC FAN SPRY TIP SCT (DISPOSABLE) ×1 IMPLANT
SET PAD KNEE POSITIONER (MISCELLANEOUS) ×2 IMPLANT
SPONGE DRAIN TRACH 4X4 STRL 2S (GAUZE/BANDAGES/DRESSINGS) IMPLANT
SPONGE LAP 18X18 X RAY DECT (DISPOSABLE) IMPLANT
SUCTION FRAZIER HANDLE 12FR (TUBING)
SUCTION TUBE FRAZIER 12FR DISP (TUBING) IMPLANT
SUT MNCRL AB 3-0 PS2 18 (SUTURE) ×2 IMPLANT
SUT MON AB 2-0 CT1 36 (SUTURE) ×4 IMPLANT
SUT MON AB 2-0 SH 27 (SUTURE) ×1
SUT MON AB 2-0 SH27 (SUTURE) ×1 IMPLANT
SUT STRATAFIX PDO 1 14 VIOLET (SUTURE) ×1
SUT STRATFX PDO 1 14 VIOLET (SUTURE) ×1
SUT VIC AB 1 CT1 36 (SUTURE) ×6 IMPLANT
SUT VIC AB 2-0 CT1 27 (SUTURE) ×1
SUT VIC AB 2-0 CT1 TAPERPNT 27 (SUTURE) ×1 IMPLANT
SUTURE STRATFX PDO 1 14 VIOLET (SUTURE) ×1 IMPLANT
SYR 50ML LL SCALE MARK (SYRINGE) IMPLANT
TOWER CARTRIDGE SMART MIX (DISPOSABLE) IMPLANT
TRAY FOLEY CATH 14FRSI W/METER (CATHETERS) ×2 IMPLANT
TRAY FOLEY W/METER SILVER 16FR (SET/KITS/TRAYS/PACK) IMPLANT
WATER STERILE IRR 1000ML POUR (IV SOLUTION) ×4 IMPLANT
WRAP KNEE MAXI GEL POST OP (GAUZE/BANDAGES/DRESSINGS) ×2 IMPLANT
YANKAUER SUCT BULB TIP 10FT TU (MISCELLANEOUS) ×2 IMPLANT

## 2017-05-28 NOTE — Anesthesia Preprocedure Evaluation (Signed)
Anesthesia Evaluation  Patient identified by MRN, date of birth, ID band Patient awake    Reviewed: Allergy & Precautions, NPO status , Patient's Chart, lab work & pertinent test results  Airway Mallampati: II  TM Distance: >3 FB Neck ROM: Full    Dental no notable dental hx.    Pulmonary neg pulmonary ROS,    Pulmonary exam normal breath sounds clear to auscultation       Cardiovascular hypertension, + CAD and + CABG  Normal cardiovascular exam Rhythm:Regular Rate:Normal  /P Bio-Bentall aortic root replacement with bioprosthetic valve conduit 10/27/2013 23 mm Edwards Magna Ease bovine pericardial tissue valve and 26 mm Vascutek gelweave Val-Salva aortic graft    Neuro/Psych negative neurological ROS  negative psych ROS   GI/Hepatic negative GI ROS, Neg liver ROS,   Endo/Other  diabetes  Renal/GU negative Renal ROS  negative genitourinary   Musculoskeletal negative musculoskeletal ROS (+)   Abdominal   Peds negative pediatric ROS (+)  Hematology negative hematology ROS (+)   Anesthesia Other Findings   Reproductive/Obstetrics negative OB ROS                             Anesthesia Physical Anesthesia Plan  ASA: III  Anesthesia Plan: Spinal   Post-op Pain Management:    Induction: Intravenous  PONV Risk Score and Plan: 2 and Ondansetron and Dexamethasone  Airway Management Planned: Simple Face Mask  Additional Equipment:   Intra-op Plan:   Post-operative Plan:   Informed Consent: I have reviewed the patients History and Physical, chart, labs and discussed the procedure including the risks, benefits and alternatives for the proposed anesthesia with the patient or authorized representative who has indicated his/her understanding and acceptance.   Dental advisory given  Plan Discussed with: CRNA and Surgeon  Anesthesia Plan Comments:         Anesthesia Quick  Evaluation

## 2017-05-28 NOTE — Anesthesia Procedure Notes (Signed)
Anesthesia Procedure Image    

## 2017-05-28 NOTE — Discharge Instructions (Signed)
° °Dr. Ed Mandich °Total Joint Specialist °[facility] °[address] °[place], [place] [address] °[phone] ° °TOTAL KNEE REPLACEMENT POSTOPERATIVE DIRECTIONS ° ° ° °Knee Rehabilitation, Guidelines Following Surgery  °Results after knee surgery are often greatly improved when you follow the exercise, range of motion and muscle strengthening exercises prescribed by your doctor. Safety measures are also important to protect the knee from further injury. Any time any of these exercises cause you to have increased pain or swelling in your knee joint, decrease the amount until you are comfortable again and slowly increase them. If you have problems or questions, call your caregiver or physical therapist for advice.  ° °WEIGHT BEARING °Weight bearing as tolerated with assist device (walker, cane, etc) as directed, use it as long as suggested by your surgeon or therapist, typically at least 4-6 weeks. ° °HOME CARE INSTRUCTIONS  °Remove items at home which could result in a fall. This includes throw rugs or furniture in walking pathways.  °Continue medications as instructed at time of discharge. °You may have some home medications which will be placed on hold until you complete the course of blood thinner medication.  °You may start showering once you are discharged home but do not submerge the incision under water. Just pat the incision dry and apply a dry gauze dressing on daily. °Walk with walker as instructed.  °You may resume a sexual relationship in one month or when given the OK by your doctor.  °· Use walker as long as suggested by your caregivers. °· Avoid periods of inactivity such as sitting longer than an hour when not asleep. This helps prevent blood clots.  °You may put full weight on your legs and walk as much as is comfortable.  °You may return to work once you are cleared by your doctor.  °Do not drive a car for 6 weeks or until released by you surgeon.  °· Do not drive  while taking narcotics.  °Wear the elastic stockings for three weeks following surgery during the day but you may remove then at night. °Make sure you keep all of your appointments after your operation with all of your doctors and caregivers. You should call the office at the above phone number and make an appointment for approximately two weeks after the date of your surgery. °Do not remove your surgical dressing. The dressing is waterproof; you may take showers in 3 days, but do not take tub baths or submerge the dressing. °Please pick up a stool softener and laxative for home use as long as you are requiring pain medications. °· ICE to the affected knee every three hours for 30 minutes at a time and then as needed for pain and swelling.  Continue to use ice on the knee for pain and swelling from surgery. You may notice swelling that will progress down to the foot and ankle.  This is normal after surgery.  Elevate the leg when you are not up walking on it.   °It is important for you to complete the blood thinner medication as prescribed by your doctor. °· Continue to use the breathing machine which will help keep your temperature down.  It is common for your temperature to cycle up and down following surgery, especially at night when you are not up moving around and exerting yourself.  The breathing machine keeps your lungs expanded and your temperature down. ° °RANGE OF MOTION AND STRENGTHENING EXERCISES  °Rehabilitation of the knee is important following   a knee injury or an operation. After just a few days of immobilization, the muscles of the thigh which control the knee become weakened and shrink (atrophy). Knee exercises are designed to build up the tone and strength of the thigh muscles and to improve knee motion. Often times heat used for twenty to thirty minutes before working out will loosen up your tissues and help with improving the range of motion but do not use heat for the first two weeks following  surgery. These exercises can be done on a training (exercise) mat, on the floor, on a table or on a bed. Use what ever works the best and is most comfortable for you Knee exercises include:  °Leg Lifts - While your knee is still immobilized in a splint or cast, you can do straight leg raises. Lift the leg to 60 degrees, hold for 3 sec, and slowly lower the leg. Repeat 10-20 times 2-3 times daily. Perform this exercise against resistance later as your knee gets better.  °Quad and Hamstring Sets - Tighten up the muscle on the front of the thigh (Quad) and hold for 5-10 sec. Repeat this 10-20 times hourly. Hamstring sets are done by pushing the foot backward against an object and holding for 5-10 sec. Repeat as with quad sets.  °A rehabilitation program following serious knee injuries can speed recovery and prevent re-injury in the future due to weakened muscles. Contact your doctor or a physical therapist for more information on knee rehabilitation.  ° °SKILLED REHAB INSTRUCTIONS: °If the patient is transferred to a skilled rehab facility following release from the hospital, a list of the current medications will be sent to the facility for the patient to continue.  When discharged from the skilled rehab facility, please have the facility set up the patient's Home Health Physical Therapy prior to being released. Also, the skilled facility will be responsible for providing the patient with their medications at time of release from the facility to include their pain medication, the muscle relaxants, and their blood thinner medication. If the patient is still at the rehab facility at time of the two week follow up appointment, the skilled rehab facility will also need to assist the patient in arranging follow up appointment in our office and any transportation needs. ° °MAKE SURE YOU:  °Understand these instructions.  °Will watch your condition.  °Will get help right away if you are not doing well or get worse.   ° ° °Pick up stool softner and laxative for home use following surgery while on pain medications. °Do NOT remove your dressing. You may shower.  °Do not take tub baths or submerge incision under water. °May shower starting three days after surgery. °Please use a clean towel to pat the incision dry following showers. °Continue to use ice for pain and swelling after surgery. °Do not use any lotions or creams on the incision until instructed by your surgeon. ° °

## 2017-05-28 NOTE — Progress Notes (Signed)
Assisted Dr. Rose with left, ultrasound guided, adductor canal block. Side rails up, monitors on throughout procedure. See vital signs in flow sheet. Tolerated Procedure well.  

## 2017-05-28 NOTE — Transfer of Care (Signed)
Immediate Anesthesia Transfer of Care Note  Patient: [name] [name]  Procedure(s) Performed: LEFT TOTAL KNEE ARTHROPLASTY WITH COMPUTER NAVIGATION (Left Knee)  Patient Location: PACU  Anesthesia Type:Spinal  Level of Consciousness: awake, alert  and oriented  Airway & Oxygen Therapy: Patient Spontanous Breathing and Patient connected to face mask oxygen  Post-op Assessment: Report given to RN  Post vital signs: Reviewed and stable  Last Vitals:  Vitals:   05/28/17 1009 05/28/17 1010  BP:    Pulse: 60 61  Resp: 14 14  Temp:    SpO2: 98% 98%    Last Pain:  Vitals:   05/28/17 0837  TempSrc: Oral      Patients Stated Pain Goal: 4 (43/60/67 7034)  Complications: No apparent anesthesia complications

## 2017-05-28 NOTE — Anesthesia Procedure Notes (Addendum)
Anesthesia Regional Block: Adductor canal block   Pre-Anesthetic Checklist: ,, timeout performed, Correct Patient, Correct Site, Correct Laterality, Correct Procedure, Correct Position, site marked, Risks and benefits discussed,  Surgical consent,  Pre-op evaluation,  At surgeon's request and post-op pain management  Laterality: Left  Prep: chloraprep       Needles:  Injection technique: Single-shot  Needle Type: Echogenic Needle     Needle Length: 9cm      Additional Needles:   Procedures:,,,, ultrasound used (permanent image in chart),,,,  Narrative:  Start time: 05/28/2017 9:48 AM End time: 05/28/2017 9:58 AM Injection made incrementally with aspirations every 5 mL.  Performed by: Personally  Anesthesiologist: Myrtie Soman, MD  Additional Notes: Patient tolerated the procedure well without complications

## 2017-05-28 NOTE — Anesthesia Procedure Notes (Addendum)
Spinal  Patient location during procedure: OR Start time: 05/28/2017 11:22 AM End time: 05/28/2017 11:31 AM Staffing Anesthesiologist: Myrtie Soman, MD Performed: anesthesiologist  Preanesthetic Checklist Completed: patient identified, site marked, surgical consent, pre-op evaluation, timeout performed, IV checked, risks and benefits discussed and monitors and equipment checked Spinal Block Patient position: sitting Prep: Betadine Patient monitoring: heart rate, continuous pulse ox and blood pressure Injection technique: single-shot Needle Needle type: Sprotte  Needle gauge: 24 G Needle length: 9 cm Additional Notes Expiration date of kit checked and confirmed. Patient tolerated procedure well, without complications.

## 2017-05-28 NOTE — Interval H&P Note (Signed)
History and Physical Interval Note:  05/28/2017 9:52 AM  [name] [name]  has presented today for surgery, with the diagnosis of left knee degenerative joint disease  The various methods of treatment have been discussed with the patient and family. After consideration of risks, benefits and other options for treatment, the patient has consented to  Procedure(s) with comments: LEFT TOTAL KNEE ARTHROPLASTY WITH COMPUTER NAVIGATION (Left) - Needs RNFA as a surgical intervention .  The patient's history has been reviewed, patient examined, no change in status, stable for surgery.  I have reviewed the patient's chart and labs.  Questions were answered to the patient's satisfaction.     Hilton Cork Lonna Rabold

## 2017-05-28 NOTE — Evaluation (Signed)
Physical Therapy Evaluation Patient Details Name: [name] [name] MRN: [id number] DOB: 06-17-1945 Today's Date: 05/28/2017   History of Present Illness  72 y.o. female admitted on 05/28/17 for elective L TKA.  Pt with significant PMH of R TKA (10/18), CABG, HTN, gout, DM, CAD, chronic diastolic CHF, cardiomyopathy, aortic regurgitation s/p valve replacement.     Clinical Impression  Pt is POD #0 and is moving well.  She is a bit lightheaded, but just got a shot of IV pain meds as PT was going into the room.  She was able to walk a short distance down the hallway with RW.  HEP program initiated.   PT to follow acutely for deficits listed below.       Follow Up Recommendations Follow surgeon's recommendation for DC plan and follow-up therapies;Supervision for mobility/OOB    Equipment Recommendations  None recommended by PT    Recommendations for Other Services    NA   Precautions / Restrictions Precautions Precautions: Knee Precaution Booklet Issued: Yes (comment) Precaution Comments: knee exercise handout given Restrictions Weight Bearing Restrictions: Yes LLE Weight Bearing: Weight bearing as tolerated      Mobility  Bed Mobility Overal bed mobility: Needs Assistance Bed Mobility: Supine to Sit     Supine to sit: Min guard;HOB elevated     General bed mobility comments: Min guard assist for safety during transitions, light assit of right leg.   Transfers Overall transfer level: Needs assistance Equipment used: Rolling walker (2 wheeled) Transfers: Sit to/from Stand Sit to Stand: Min assist         General transfer comment: Min assist to support trunk during transitions, verbal cues for safe hand placement during transitions.   Ambulation/Gait Ambulation/Gait assistance: Min assist;+2 safety/equipment Ambulation Distance (Feet): 65 Feet Assistive device: Rolling walker (2 wheeled) Gait Pattern/deviations: Step-to pattern;Antalgic Gait velocity: decreased Gait  velocity interpretation: Below normal speed for age/gender General Gait Details: Pt with moderately antalgic gait pattern, verbal cues for safe LE sequencing and RW use.  Chair to follow for safety      Balance Overall balance assessment: Needs assistance Sitting-balance support: Feet supported;Bilateral upper extremity supported Sitting balance-Leahy Scale: Good     Standing balance support: Bilateral upper extremity supported Standing balance-Leahy Scale: Poor                               Pertinent Vitals/Pain Pain Assessment: Faces Faces Pain Scale: Hurts even more Pain Location: left knee with gait Pain Descriptors / Indicators: Aching;Burning Pain Intervention(s): Limited activity within patient's tolerance;Monitored during session;Repositioned;Patient requesting pain meds-RN notified;Ice applied    Home Living Family/patient expects to be discharged to:: Private residence Living Arrangements: Spouse/significant other Available Help at Discharge: Available 24 hours/day;Family Type of Home: House Home Access: Stairs to enter   Technical brewer of Steps: 1 Home Layout: One level Home Equipment: Bedside commode;Walker - 2 wheels      Prior Function Level of Independence: Independent               Hand Dominance   Dominant Hand: Right    Extremity/Trunk Assessment   Upper Extremity Assessment Upper Extremity Assessment: Defer to OT evaluation    Lower Extremity Assessment Lower Extremity Assessment: LLE deficits/detail LLE Deficits / Details: left leg with normal post op pain and weakness.  Ankle 3/5, knee 3-/5 extension, hip flexion 3-/5    Cervical / Trunk Assessment Cervical / Trunk Assessment: Normal  Communication  Communication: No difficulties  Cognition Arousal/Alertness: Awake/alert Behavior During Therapy: WFL for tasks assessed/performed Overall Cognitive Status: Within Functional Limits for tasks assessed                                            Exercises Total Joint Exercises Ankle Circles/Pumps: AROM;Both;20 reps Quad Sets: AROM;Both;10 reps Heel Slides: AAROM;Left;10 reps   Assessment/Plan    PT Assessment Patient needs continued PT services  PT Problem List Decreased strength;Decreased range of motion;Decreased activity tolerance;Decreased balance;Decreased mobility;Decreased knowledge of use of DME;Decreased knowledge of precautions;Pain       PT Treatment Interventions DME instruction;Stair training;Gait training;Functional mobility training;Therapeutic exercise;Therapeutic activities;Balance training;Patient/family education;Manual techniques;Modalities    PT Goals (Current goals can be found in the Care Plan section)  Acute Rehab PT Goals Patient Stated Goal: to get back to pain free walking and traveling PT Goal Formulation: With patient/family Time For Goal Achievement: 06/11/17 Potential to Achieve Goals: Good    Frequency 7X/week           AM-PAC PT "6 Clicks" Daily Activity  Outcome Measure Difficulty turning over in bed (including adjusting bedclothes, sheets and blankets)?: A Little Difficulty moving from lying on back to sitting on the side of the bed? : A Little Difficulty sitting down on and standing up from a chair with arms (e.g., wheelchair, bedside commode, etc,.)?: Unable Help needed moving to and from a bed to chair (including a wheelchair)?: A Little Help needed walking in hospital room?: A Little Help needed climbing 3-5 steps with a railing? : A Little 6 Click Score: 16    End of Session Equipment Utilized During Treatment: Gait belt Activity Tolerance: Patient limited by pain Patient left: in chair;with call bell/phone within reach;with family/visitor present Nurse Communication: Mobility status PT Visit Diagnosis: Muscle weakness (generalized) (M62.81);Difficulty in walking, not elsewhere classified (R26.2);Pain Pain - Right/Left:  Left Pain - part of body: Knee    Time: 8250-0370 PT Time Calculation (min) (ACUTE ONLY): 26 min   Charges:        Wells Guiles B. Bristol Soy, PT, DPT [phone]   PT Evaluation $PT Eval Moderate Complexity: 1 Mod PT Treatments $Gait Training: 8-22 mins   05/28/2017, 5:30 PM

## 2017-05-28 NOTE — Anesthesia Postprocedure Evaluation (Signed)
Anesthesia Post Note  Patient: [name] [name]  Procedure(s) Performed: LEFT TOTAL KNEE ARTHROPLASTY WITH COMPUTER NAVIGATION (Left Knee)     Patient location during evaluation: PACU Anesthesia Type: Spinal Level of consciousness: oriented and awake and alert Pain management: pain level controlled Vital Signs Assessment: post-procedure vital signs reviewed and stable Respiratory status: spontaneous breathing, respiratory function stable and patient connected to nasal cannula oxygen Cardiovascular status: blood pressure returned to baseline and stable Postop Assessment: no headache, no backache and no apparent nausea or vomiting Anesthetic complications: no    Last Vitals:  Vitals:   05/28/17 1445 05/28/17 1500  BP: 131/64 128/61  Pulse: 64 65  Resp: 14 (!) 22  Temp:  36.6 C  SpO2: 100% 100%    Last Pain:  Vitals:   05/28/17 1500  TempSrc:   PainSc: 5     LLE Motor Response: Purposeful movement;Responds to commands (05/28/17 1500) LLE Sensation: Pain;Full sensation (05/28/17 1500) RLE Motor Response: Purposeful movement;Responds to commands (05/28/17 1500) RLE Sensation: No pain;Full sensation (05/28/17 1500) L Sensory Level: S1-Sole of foot, small toes (05/28/17 1500) R Sensory Level: S1-Sole of foot, small toes (05/28/17 1500)  Jayquon Theiler S

## 2017-05-28 NOTE — Op Note (Signed)
OPERATIVE REPORT  SURGEON: Rod Can, MD   ASSISTANT: Nehemiah Massed, PA-C.  PREOPERATIVE DIAGNOSIS: Left knee arthritis.   POSTOPERATIVE DIAGNOSIS: Left knee arthritis.   PROCEDURE: Left total knee arthroplasty.   IMPLANTS: Stryker Triathlon CR femur, size 4. Stryker Tritanium tibia, size 4. X3 polyethelyene insert, size 11 mm, CS. 3 button asymmetric patella, size 29 mm. Simplex P bone cement, 1 pack.  ANESTHESIA:  Regional and Spinal  TOURNIQUET TIME: Not utilized.   ESTIMATED BLOOD LOSS:-300 mL    ANTIBIOTICS: 900 mg clindamycin.  DRAINS: None.  COMPLICATIONS: None   CONDITION: PACU - hemodynamically stable.   BRIEF CLINICAL NOTE: [name] [name] is a 72 y.o. female with a long-standing history of Left knee arthritis. After failing conservative management, the patient was indicated for total knee arthroplasty. The risks, benefits, and alternatives to the procedure were explained, and the patient elected to proceed.  PROCEDURE IN DETAIL: Adductor canal block was obtained in the pre-op holding area. Once inside the operative room, spinal anesthesia was obtained, and a foley catheter was inserted. The patient was then positioned, a nonsterile tourniquet was placed, and the lower extremity was prepped and draped in the normal sterile surgical fashion. A time-out was called verifying side and site of surgery. The patient received IV antibiotics within 60 minutes of beginning the procedure. The tourniquet was not utilized.  An anterior approach to the knee was performed utilizing a midvastus arthrotomy. A medial release was performed and the patellar fat pad was excised. Stryker navigation was used to cut the distal femur perpendicular to the mechanical axis. A freehand patellar resection was performed, and the patella was sized an prepared with 3 lug holes.  Patient had  somewhat poor bone quality regarding the patella, so I elected to proceed with cementing of the patellar component.  Nagivation was used to make a neutral proximal tibia resection, taking 9 mm of bone from the less affected lateral side with 3 degrees of slope. The menisci were excised. A spacer block was placed, and the alignment and balance in extension were confirmed.   The distal femur was sized using the 3-degree external rotation guide referencing the posterior femoral cortex. The appropriate 4-in-1 cutting block was pinned into place. Rotation was checked using Whiteside's line, the epicondylar axis, and then confirmed with a spacer block in flexion. The remaining femoral cuts were performed, taking care to protect the MCL.  The tibia was sized and the trial tray was pinned into place. The remaining trail components were inserted. The knee was stable to varus and valgus stress through a full range of motion. The patella tracked centrally, and the PCL was well balanced.  Tibial and femoral bone stock were more than adequate for press-fit fixation. The trial components were removed, and the proximal tibial surface was prepared. Final tibial and femoral components were impacted into place.  The patellar component was cemented in place, and the clamp was held until the cement polymerized.  The knee was tested for a final time and found to be well balanced.  The wound was copiously irrigated with normal saline with pulse lavage. Marcaine solution was injected into the periarticular soft tissue. The wound was closed in layers using #1 Vicryl and Stratafix for the fascia, 2-0 Vicryl for the subcutaneous fat, 2-0 Monocryl for the deep dermal layer, 3-0 running Monocryl subcuticular Stitch, and Dermabond for the skin. Once the glue was fully dried, an Aquacell Ag and compressive dressing were applied. Tthe patient was transported to  the recovery room in stable condition. Sponge, needle, and instrument  counts were correct at the end of the case x2. The patient tolerated the procedure well and there were no known complications.  Please note that a surgical assistant was a medical necessity for this procedure in order to perform it in a safe and expeditious manner. Surgical assistant was necessary to retract the ligaments and vital neurovascular structures to prevent injury to them and also necessary for proper positioning of the limb to allow for anatomic placement of the prosthesis.

## 2017-05-29 LAB — GLUCOSE, CAPILLARY
Glucose-Capillary: 163 mg/dL — ABNORMAL HIGH (ref 65–99)
Glucose-Capillary: 244 mg/dL — ABNORMAL HIGH (ref 65–99)
Glucose-Capillary: 246 mg/dL — ABNORMAL HIGH (ref 65–99)

## 2017-05-29 LAB — BASIC METABOLIC PANEL
Anion gap: 12 (ref 5–15)
BUN: 18 mg/dL (ref 6–20)
CO2: 18 mmol/L — ABNORMAL LOW (ref 22–32)
Calcium: 8.4 mg/dL — ABNORMAL LOW (ref 8.9–10.3)
Chloride: 103 mmol/L (ref 101–111)
Creatinine, Ser: 0.77 mg/dL (ref 0.44–1.00)
GFR calc Af Amer: >60 mL/min (ref >60)
GFR calc non Af Amer: >60 mL/min (ref >60)
Glucose, Bld: 177 mg/dL — ABNORMAL HIGH (ref 65–99)
Potassium: 4.6 mmol/L (ref 3.5–5.1)
Sodium: 133 mmol/L — ABNORMAL LOW (ref 135–145)

## 2017-05-29 LAB — CBC
HCT: 31.8 % — ABNORMAL LOW (ref 36.0–46.0)
Hemoglobin: 10.9 g/dL — ABNORMAL LOW (ref 12.0–15.0)
MCH: 32.6 pg (ref 26.0–34.0)
MCHC: 34.3 g/dL (ref 30.0–36.0)
MCV: 95.2 fL (ref 78.0–100.0)
Platelets: 157 10*3/uL (ref 150–400)
RBC: 3.34 MIL/uL — ABNORMAL LOW (ref 3.87–5.11)
RDW: 13.1 % (ref 11.5–15.5)
WBC: 14.6 10*3/uL — ABNORMAL HIGH (ref 4.0–10.5)

## 2017-05-29 MED ORDER — SODIUM CHLORIDE 0.9 % IV BOLUS (SEPSIS)
1000.0000 mL | Freq: Once | INTRAVENOUS | Status: AC
  Administered 2017-05-29: 11:00:00 1000 mL via INTRAVENOUS

## 2017-05-29 MED ORDER — DOCUSATE SODIUM 100 MG PO CAPS: 100.0000 mg | ORAL_CAPSULE | Freq: Two times a day (BID) | ORAL | 1 refills | Status: DC

## 2017-05-29 MED ORDER — HYDROCODONE-ACETAMINOPHEN 5-325 MG PO TABS: 1.0000 | ORAL_TABLET | Freq: Four times a day (QID) | ORAL | 0 refills | Status: DC | PRN

## 2017-05-29 MED ORDER — ONDANSETRON HCL 4 MG PO TABS: 4.0000 mg | ORAL_TABLET | Freq: Four times a day (QID) | ORAL | 0 refills | Status: DC | PRN

## 2017-05-29 MED ORDER — SENNA 8.6 MG PO TABS: 2.0000 | ORAL_TABLET | Freq: Every day | ORAL | 0 refills | Status: DC

## 2017-05-29 MED ORDER — ASPIRIN 81 MG PO CHEW: 81.0000 mg | CHEWABLE_TABLET | Freq: Two times a day (BID) | ORAL | 1 refills | Status: AC

## 2017-05-29 NOTE — Progress Notes (Signed)
Discharge planning, no HH needs identified. Plan for OP PT, has DME. [phone] 

## 2017-05-29 NOTE — Discharge Summary (Signed)
Physician Discharge Summary  Patient ID: [name] [name] MRN: [id number] DOB/AGE: 05/25/1945 72 y.o.  Admit date: 05/28/2017 Discharge date: 05/31/2017  Admission Diagnoses:  Osteoarthritis of left knee  Discharge Diagnoses:  Principal Problem:   Osteoarthritis of left knee   Past Medical History:  Diagnosis Date  . Adenomatous colon polyp   . Aortic regurgitation    severe by TEE s/p pericardia tissue AVR  . Arthritis   . Cardiomyopathy ([place])    EF initally 25% but now normalized  . Chronic diastolic CHF (congestive heart failure), NYHA class 2 ([place])   . Coronary artery disease 10/05/2013   Severe mid LAD stenosis s/p 1 vessel CABG with LIMA to LAD  . Diabetes mellitus without complication ([place])    DX 20 YRS AGO.   TYPE 2  . Dilated aortic root ([place])   . GERD (gastroesophageal reflux disease)    Tums prn  . Gout   . Gout   . Heart murmur   . Hypercholesteremia    LDL goal < 70  . Hypertension   . S/P Bio-Bentall aortic root replacement with bioprosthetic valve conduit 10/27/2013   23 mm [facility] Ease bovine pericardial tissue valve and 26 mm Vascutek gelweave Val-Salva aortic graft  . S/P CABG x 1 10/27/2013   LIMA to LAD  . Thyroiditis 10/2009   lab and u/s-thyroid function normalized in 9/11    Surgeries: Procedure(s): LEFT TOTAL KNEE ARTHROPLASTY WITH COMPUTER NAVIGATION on 05/28/2017   Consultants (if any):   Discharged Condition: Improved  Hospital Course: [name] [name] is an 72 y.o. female who was admitted 05/28/2017 with a diagnosis of Osteoarthritis of left knee and went to the operating room on 05/28/2017 and underwent the above named procedures.    She was given perioperative antibiotics:  Anti-infectives (From admission, onward)   Start     Dose/Rate Route Frequency Ordered Stop   05/28/17 1700  clindamycin (CLEOCIN) IVPB 600 mg     600 mg 100 mL/hr over 30 Minutes Intravenous Every 6 hours 05/28/17 1541 05/29/17 0014   05/28/17 0828   clindamycin (CLEOCIN) IVPB 900 mg     900 mg 100 mL/hr over 30 Minutes Intravenous On call to O.R. 05/28/17 8563 05/28/17 1111    .  She was given sequential compression devices, early ambulation, and ASA for DVT prophylaxis.  She benefited maximally from the hospital stay and there were no complications.    Recent vital signs:  Vitals:   05/31/17 0606 05/31/17 0835  BP: (!) 113/58 (!) 110/57  Pulse: 75 88  Resp: 18   Temp: 97.6 F (36.4 C)   SpO2: 97%     Recent laboratory studies:  Lab Results  Component Value Date   HGB 9.8 (L) 05/31/2017   HGB 9.8 (L) 05/30/2017   HGB 10.9 (L) 05/29/2017   Lab Results  Component Value Date   WBC 10.1 05/31/2017   PLT 147 (L) 05/31/2017   Lab Results  Component Value Date   INR 1.49 10/27/2013   Lab Results  Component Value Date   NA 133 (L) 05/29/2017   K 4.6 05/29/2017   CL 103 05/29/2017   CO2 18 (L) 05/29/2017   BUN 18 05/29/2017   CREATININE 0.77 05/29/2017   GLUCOSE 177 (H) 05/29/2017    Discharge Medications:   Allergies as of 05/31/2017      Reactions   Atenolol Shortness Of Breath, Other (See Comments)   Edema and Headache   Lisinopril Cough  Penicillins Rash, Other (See Comments)   Has patient had a PCN reaction causing immediate rash, facial/tongue/throat swelling, SOB or lightheadedness with hypotension: No Has patient had a PCN reaction causing severe rash involving mucus membranes or skin necrosis: No Has patient had a PCN reaction that required hospitalization No Has patient had a PCN reaction occurring within the last 10 years: No If all of the above answers are "NO", then may proceed with Cephalosporin use.G Benzathine: Local injection rash   Sulfa Antibiotics Hives, Rash      Medication List    STOP taking these medications   acetaminophen 650 MG CR tablet Commonly known as:  TYLENOL   aspirin EC 81 MG tablet Replaced by:  aspirin 81 MG chewable tablet   carvedilol 6.25 MG tablet Commonly  known as:  COREG     TAKE these medications   aspirin 81 MG chewable tablet Chew 1 tablet (81 mg total) by mouth 2 (two) times daily. Replaces:  aspirin EC 81 MG tablet   atorvastatin 10 MG tablet Commonly known as:  LIPITOR TAKE 1 TABLET BY MOUTH EVERY DAY AT 6PM. Please make yearly appt with Dr. Radford Pax for March before anymore refills. 1st attempt   CALCIUM 600-D PO Take 1 tablet by mouth daily.   clindamycin 150 MG capsule Commonly known as:  CLEOCIN Take 4 capsules by mouth as needed. (Take one (1) hour prior to dental procedures)   COLCRYS 0.6 MG tablet Generic drug:  colchicine Take 0.6 mg by mouth daily.   docusate sodium 100 MG capsule Commonly known as:  COLACE Take 1 capsule (100 mg total) by mouth 2 (two) times daily.   fluticasone 50 MCG/ACT nasal spray Commonly known as:  FLONASE Place 1 spray into both nostrils 2 (two) times daily.   GLUCOSAMINE-CHONDROITIN PO Take 1 tablet by mouth daily. Glucosamine 1200 mg, chondroitin 1500 mg   HYDROcodone-acetaminophen 5-325 MG tablet Commonly known as:  NORCO/VICODIN Take 1-2 tablets by mouth every 6 (six) hours as needed.   losartan 25 MG tablet Commonly known as:  COZAAR Take 1 tablet (25 mg total) by mouth daily.   metFORMIN 500 MG 24 hr tablet Commonly known as:  GLUCOPHAGE-XR Take 1,000 mg by mouth at bedtime.   multivitamin with minerals Tabs tablet Take 1 tablet by mouth daily. Centrum Silver   naproxen sodium 220 MG tablet Commonly known as:  ALEVE Take 440 mg by mouth daily as needed (headaches).   ondansetron 4 MG tablet Commonly known as:  ZOFRAN Take 1 tablet (4 mg total) by mouth every 6 (six) hours as needed for nausea.   ONE TOUCH ULTRA TEST test strip Generic drug:  glucose blood   pramipexole 0.125 MG tablet Commonly known as:  MIRAPEX Take 0.125 mg by mouth at bedtime.   ranitidine 150 MG tablet Commonly known as:  ZANTAC Take 150 mg by mouth daily as needed for heartburn.    senna 8.6 MG Tabs tablet Commonly known as:  SENOKOT Take 2 tablets (17.2 mg total) by mouth at bedtime.            Discharge Care Instructions  (From admission, onward)        Start     Ordered   05/31/17 0000  Weight bearing as tolerated    Question Answer Comment  Laterality left   Extremity Lower      05/31/17 0835      Diagnostic Studies: Dg Knee Left Port  Result Date: 05/28/2017 CLINICAL DATA:  Postop left total knee replacement today. EXAM: PORTABLE LEFT KNEE - 1-2 VIEW COMPARISON:  None FINDINGS: Sequelae of recent total knee arthroplasty are identified. Gas is present in the knee joint and adjacent soft tissues. There is no dislocation or periprosthetic fracture. An 8 mm enthesophyte or osseous fragment is noted at the superior patella. There is overlying scratched of there is associated soft tissue swelling about the knee. IMPRESSION: Sequelae of recent total knee arthroplasty as above. Electronically Signed   By: Logan Bores M.D.   On: 05/28/2017 16:21    Disposition: 01-Home or Self Care  Discharge Instructions    Call MD / Call 911   Complete by:  As directed    If you experience chest pain or shortness of breath, CALL 911 and be transported to the hospital emergency room.  If you develope a fever above 101 F, pus (white drainage) or increased drainage or redness at the wound, or calf pain, call your surgeon's office.   Constipation Prevention   Complete by:  As directed    Drink plenty of fluids.  Prune juice may be helpful.  You may use a stool softener, such as Colace (over the counter) 100 mg twice a day.  Use MiraLax (over the counter) for constipation as needed.   Diet - low sodium heart healthy   Complete by:  As directed    Increase activity slowly as tolerated   Complete by:  As directed    Weight bearing as tolerated   Complete by:  As directed    Laterality:  left   Extremity:  Lower      Follow-up Information    Fidela Cieslak, Aaron Edelman, MD.  Schedule an appointment as soon as possible for a visit in 2 week(s).   Specialty:  Orthopedic Surgery Why:  For wound re-check Contact information: [address] [place] [address] [phone]           Signed: Hilton Cork Roslind Michaux 06/01/2017, 7:39 AM

## 2017-05-29 NOTE — Progress Notes (Signed)
Physical Therapy Treatment Patient Details Name: [name] [name] MRN: [id number] DOB: 20-Jan-1946 Today's Date: 05/29/2017    History of Present Illness 72 y.o. female admitted on 05/28/17 for elective L TKA.  Pt with significant PMH of R TKA (10/18), CABG, HTN, gout, DM, CAD, chronic diastolic CHF, cardiomyopathy, aortic regurgitation s/p valve replacement.       PT Comments    Pt progressing toward goals; will continue to follow    Follow Up Recommendations  Follow surgeon's recommendation for DC plan and follow-up therapies;Supervision for mobility/OOB     Equipment Recommendations  None recommended by PT    Recommendations for Other Services       Precautions / Restrictions Precautions Precautions: Knee Restrictions LLE Weight Bearing: Weight bearing as tolerated    Mobility  Bed Mobility Overal bed mobility: Needs Assistance Bed Mobility: Supine to Sit     Supine to sit: Supervision     General bed mobility comments: for safety  Transfers Overall transfer level: Needs assistance Equipment used: Rolling walker (2 wheeled) Transfers: Sit to/from Stand Sit to Stand: Min guard         General transfer comment: for safety  Ambulation/Gait Ambulation/Gait assistance: Min guard Ambulation Distance (Feet): 120 Feet Assistive device: Rolling walker (2 wheeled) Gait Pattern/deviations: Step-to pattern;Step-through pattern;Decreased stride length     General Gait Details: cues for gait progression; chair follow for safety d/t decr BP--pt asymptomatic   Stairs            Wheelchair Mobility    Modified Rankin (Stroke Patients Only)       Balance     Sitting balance-Leahy Scale: Good       Standing balance-Leahy Scale: Fair                              Cognition Arousal/Alertness: Awake/alert Behavior During Therapy: WFL for tasks assessed/performed Overall Cognitive Status: Within Functional Limits for tasks assessed                                        Exercises Total Joint Exercises Ankle Circles/Pumps: AROM;Both;20 reps Quad Sets: AROM;Both;10 reps Heel Slides: AAROM;Left;10 reps Hip ABduction/ADduction: AAROM;AROM;Left;10 reps Straight Leg Raises: AAROM;Left;10 reps    General Comments        Pertinent Vitals/Pain Pain Assessment: Faces Faces Pain Scale: Hurts little more Pain Location: left knee with gait Pain Descriptors / Indicators: Sore Pain Intervention(s): Monitored during session;Premedicated before session    Home Living                      Prior Function            PT Goals (current goals can now be found in the care plan section) Acute Rehab PT Goals Patient Stated Goal: to get back to pain free walking and traveling PT Goal Formulation: With patient/family Time For Goal Achievement: 06/11/17 Potential to Achieve Goals: Good Progress towards PT goals: Progressing toward goals    Frequency    7X/week      PT Plan Current plan remains appropriate    Co-evaluation              AM-PAC PT "6 Clicks" Daily Activity  Outcome Measure  Difficulty turning over in bed (including adjusting bedclothes, sheets and blankets)?: A Little Difficulty moving from lying on  back to sitting on the side of the bed? : A Little Difficulty sitting down on and standing up from a chair with arms (e.g., wheelchair, bedside commode, etc,.)?: A Little Help needed moving to and from a bed to chair (including a wheelchair)?: A Little Help needed walking in hospital room?: A Little Help needed climbing 3-5 steps with a railing? : A Little 6 Click Score: 18    End of Session Equipment Utilized During Treatment: Gait belt Activity Tolerance: Patient tolerated treatment well Patient left: in chair;with call bell/phone within reach;with chair alarm set   PT Visit Diagnosis: Muscle weakness (generalized) (M62.81);Difficulty in walking, not elsewhere classified  (R26.2);Pain Pain - Right/Left: Left Pain - part of body: Knee     Time: 1610-9604 PT Time Calculation (min) (ACUTE ONLY): 28 min  Charges:  $Gait Training: 8-22 mins $Therapeutic Exercise: 8-22 mins                    G CodesKenyon Ana, PT Pager: [phone] 05/29/2017    Kenyon Ana 05/29/2017, 11:22 AM

## 2017-05-29 NOTE — Progress Notes (Signed)
    Subjective:  Patient reports pain as mild to moderate.  Denies N/V/CP/SOB.   Objective:   VITALS:   Vitals:   05/28/17 2135 05/29/17 0052 05/29/17 0528 05/29/17 0803  BP: (!) 116/43 (!) 108/52 (!) 107/50 (!) 102/55  Pulse: 65 66 71 66  Resp: 16 17 18    Temp: 98.2 F (36.8 C) 97.8 F (36.6 C) 98.2 F (36.8 C)   TempSrc: Oral Oral Oral   SpO2: 100% 99% 98%   Weight:      Height:        NAD ABD soft Sensation intact distally Intact pulses distally Dorsiflexion/Plantar flexion intact Incision: dressing C/D/I Compartment soft   Lab Results  Component Value Date   WBC 14.6 (H) 05/29/2017   HGB 10.9 (L) 05/29/2017   HCT 31.8 (L) 05/29/2017   MCV 95.2 05/29/2017   PLT 157 05/29/2017   BMET    Component Value Date/Time   NA 133 (L) 05/29/2017 0533   K 4.6 05/29/2017 0533   CL 103 05/29/2017 0533   CO2 18 (L) 05/29/2017 0533   GLUCOSE 177 (H) 05/29/2017 0533   BUN 18 05/29/2017 0533   CREATININE 0.77 05/29/2017 0533   CALCIUM 8.4 (L) 05/29/2017 0533   GFRNONAA >60 05/29/2017 0533   GFRAA >60 05/29/2017 0533     Assessment/Plan: 1 Day Post-Op   Principal Problem:   Osteoarthritis of left knee   WBAT with walker DVT ppx: ASA, SCDs, TEDS PO pain control PT/OT DM2: strict glucose control Dispo: D/C home tomorrow, already set up for outpatient PT   Hilton Cork Ettore Trebilcock 05/29/2017, 9:16 AM   Rod Can, MD Cell [phone]

## 2017-05-29 NOTE — Progress Notes (Signed)
   05/29/17 1400  PT Visit Information  Last PT Received On 05/29/17  Assistance Needed +1  History of Present Illness 72 y.o. female admitted on 05/28/17 for elective L TKA.  Pt with significant PMH of R TKA (10/18), CABG, HTN, gout, DM, CAD, chronic diastolic CHF, cardiomyopathy, aortic regurgitation s/p valve replacement.     Subjective Data  Subjective doing well BP, remains low despite bolus, pt  with only mild c/o dizziness  Patient Stated Goal to get back to pain free walking and traveling  Precautions  Precautions Knee  Restrictions  LLE Weight Bearing WBAT  Pain Assessment  Pain Assessment 0-10  Pain Score 2  Pain Location left knee with gait and lower back in bed  Pain Descriptors / Indicators Sore  Pain Intervention(s) Limited activity within patient's tolerance;Monitored during session;Repositioned  Cognition  Arousal/Alertness Awake/alert  Behavior During Therapy WFL for tasks assessed/performed  Overall Cognitive Status Within Functional Limits for tasks assessed  Bed Mobility  Overal bed mobility Needs Assistance  Bed Mobility Supine to Sit  Supine to sit Supervision;HOB elevated  General bed mobility comments for safety  Transfers  Overall transfer level Needs assistance  Equipment used Rolling walker (2 wheeled)  Transfers Sit to/from Stand  Sit to Stand Min guard  General transfer comment for safety  Ambulation/Gait  Ambulation/Gait assistance Min guard  Ambulation Distance (Feet) 80 Feet (15' more)  Assistive device Rolling walker (2 wheeled)  Gait Pattern/deviations Step-to pattern;Step-through pattern;Decreased stride length  General Gait Details cues for gait progression; chair follow for safety d/t decr BP--pt asymptomatic  Gait velocity decreased  Balance  Sitting balance-Leahy Scale Good  Standing balance-Leahy Scale Fair  Total Joint Exercises  Ankle Circles/Pumps AROM;Both;20 reps  PT - End of Session  Equipment Utilized During Treatment Gait  belt  Activity Tolerance Patient tolerated treatment well  Patient left in chair;with call bell/phone within reach;with chair alarm set  Nurse Communication Mobility status  PT - Assessment/Plan  PT Plan Current plan remains appropriate  PT Visit Diagnosis Muscle weakness (generalized) (M62.81);Difficulty in walking, not elsewhere classified (R26.2);Pain  Pain - Right/Left Left  Pain - part of body Knee  PT Frequency (ACUTE ONLY) 7X/week  Follow Up Recommendations Follow surgeon's recommendation for DC plan and follow-up therapies;Supervision for mobility/OOB  PT equipment None recommended by PT  AM-PAC PT "6 Clicks" Daily Activity Outcome Measure  Difficulty turning over in bed (including adjusting bedclothes, sheets and blankets)? 3  Difficulty moving from lying on back to sitting on the side of the bed?  3  Difficulty sitting down on and standing up from a chair with arms (e.g., wheelchair, bedside commode, etc,.)? 3  Help needed moving to and from a bed to chair (including a wheelchair)? 3  Help needed walking in hospital room? 3  Help needed climbing 3-5 steps with a railing?  3  6 Click Score 18  Mobility G Code  CK  PT Goal Progression  Progress towards PT goals Progressing toward goals  Acute Rehab PT Goals  PT Goal Formulation With patient/family  Time For Goal Achievement 06/11/17  Potential to Achieve Goals Good  PT Time Calculation  PT Start Time (ACUTE ONLY) 4235  PT Stop Time (ACUTE ONLY) 0951  PT Time Calculation (min) (ACUTE ONLY) 28 min  PT General Charges  $$ ACUTE PT VISIT 1 Visit  PT Treatments  $Gait Training 23-37 mins

## 2017-05-29 NOTE — Progress Notes (Signed)
OT Cancellation Note  Patient Details Name: [name] [name] MRN: [id number] DOB: 1945-12-02   Cancelled Treatment:    Reason Eval/Treat Not Completed: OT screened, no needs identified, will sign off    [place], Mickel Baas, [place]     05/29/2017, 10:26 AM

## 2017-05-30 LAB — GLUCOSE, CAPILLARY
Glucose-Capillary: 128 mg/dL — ABNORMAL HIGH (ref 65–99)
Glucose-Capillary: 117 mg/dL — ABNORMAL HIGH (ref 65–99)
Glucose-Capillary: 198 mg/dL — ABNORMAL HIGH (ref 65–99)
Glucose-Capillary: 192 mg/dL — ABNORMAL HIGH (ref 65–99)

## 2017-05-30 LAB — CBC
HCT: 28.8 % — ABNORMAL LOW (ref 36.0–46.0)
Hemoglobin: 9.8 g/dL — ABNORMAL LOW (ref 12.0–15.0)
MCH: 32.6 pg (ref 26.0–34.0)
MCHC: 34 g/dL (ref 30.0–36.0)
MCV: 95.7 fL (ref 78.0–100.0)
Platelets: 139 10*3/uL — ABNORMAL LOW (ref 150–400)
RBC: 3.01 MIL/uL — ABNORMAL LOW (ref 3.87–5.11)
RDW: 13.7 % (ref 11.5–15.5)
WBC: 12.9 10*3/uL — ABNORMAL HIGH (ref 4.0–10.5)

## 2017-05-30 NOTE — Progress Notes (Addendum)
Physical Therapy Treatment Patient Details Name: [name] [name] MRN: [id number] DOB: 1945/06/25 Today's Date: 05/30/2017    History of Present Illness 72 y.o. female admitted on 05/28/17 for elective L TKA.  Pt with significant PMH of R TKA (10/18), CABG, HTN, gout, DM, CAD, chronic diastolic CHF, cardiomyopathy, aortic regurgitation s/p valve replacement.       PT Comments    Pt doing well, asymptomatic during gait even though BP remains low; will continue to follow  Follow Up Recommendations  Follow surgeon's recommendation for DC plan and follow-up therapies;Supervision for mobility/OOB     Equipment Recommendations  None recommended by PT    Recommendations for Other Services       Precautions / Restrictions Precautions Precautions: Knee Restrictions LLE Weight Bearing: Weight bearing as tolerated    Mobility  Bed Mobility Overal bed mobility: Needs Assistance Bed Mobility: Supine to Sit     Supine to sit: Modified independent (Device/Increase time)        Transfers Overall transfer level: Needs assistance Equipment used: Rolling walker (2 wheeled) Transfers: Sit to/from Stand Sit to Stand: Supervision         General transfer comment: for safety  Ambulation/Gait Ambulation/Gait assistance: Supervision Ambulation Distance (Feet): 80 Feet(15' more) Assistive device: Rolling walker (2 wheeled) Gait Pattern/deviations: Step-to pattern;Step-through pattern;Decreased stride length Gait velocity: decreased   General Gait Details: cues for gait progression; chair follow for safety d/t decr BP--pt asymptomatic   Stairs  up/down one step with RW and min/guard assist          Wheelchair Mobility    Modified Rankin (Stroke Patients Only)       Balance             Standing balance-Leahy Scale: Fair                              Cognition Arousal/Alertness: Awake/alert Behavior During Therapy: WFL for tasks  assessed/performed Overall Cognitive Status: Within Functional Limits for tasks assessed                                        Exercises Total Joint Exercises Ankle Circles/Pumps: AROM;Both;20 reps Quad Sets: AROM;Both;10 reps Heel Slides: AAROM;Left;10 reps    General Comments        Pertinent Vitals/Pain Pain Assessment: 0-10 Pain Score: 3  Pain Location: left knee with gait and lower back in bed Pain Descriptors / Indicators: Sore Pain Intervention(s): Limited activity within patient's tolerance;Monitored during session;Repositioned;Ice applied    Home Living                      Prior Function            PT Goals (current goals can now be found in the care plan section) Acute Rehab PT Goals Patient Stated Goal: to get back to pain free walking and traveling PT Goal Formulation: With patient/family Time For Goal Achievement: 06/11/17 Potential to Achieve Goals: Good Progress towards PT goals: Progressing toward goals    Frequency    7X/week      PT Plan Current plan remains appropriate    Co-evaluation              AM-PAC PT "6 Clicks" Daily Activity  Outcome Measure  Difficulty turning over in bed (including adjusting bedclothes, sheets and blankets)?:  A Little Difficulty moving from lying on back to sitting on the side of the bed? : A Little Difficulty sitting down on and standing up from a chair with arms (e.g., wheelchair, bedside commode, etc,.)?: A Little Help needed moving to and from a bed to chair (including a wheelchair)?: A Little Help needed walking in hospital room?: A Little Help needed climbing 3-5 steps with a railing? : A Little 6 Click Score: 18    End of Session Equipment Utilized During Treatment: Gait belt Activity Tolerance: Patient tolerated treatment well Patient left: in chair;with call bell/phone within reach;with chair alarm set Nurse Communication: Mobility status PT Visit Diagnosis: Muscle  weakness (generalized) (M62.81);Difficulty in walking, not elsewhere classified (R26.2);Pain Pain - Right/Left: Left Pain - part of body: Knee     Time: 3744-5146 PT Time Calculation (min) (ACUTE ONLY): 23 min  Charges:  $Gait Training: 8-22 mins $Therapeutic Exercise: 8-22 mins                    G CodesKenyon Ana, PT Pager: [phone] 05/30/2017    Kenyon Ana 05/30/2017, 1:09 PM

## 2017-05-30 NOTE — Progress Notes (Signed)
Subjective: 2 Days Post-Op Procedure(s) (LRB): LEFT TOTAL KNEE ARTHROPLASTY WITH COMPUTER NAVIGATION (Left) Patient reports pain as mild.  Reports was dizzy when ambulating yesterday, was hypotensive and hyperglycemic. Has been OOB to bathroom only this AM and felt better, no dizziness, but has not ambulated in the hallway. Still hypotensive this AM, improved from yesterday, but has not received her antihypertensive yet. Concerned about D/C. Seen by myself and Dr. Gladstone Lighter in rounds.  Objective: Vital signs in last 24 hours: Temp:  [97.6 F (36.4 C)-98.7 F (37.1 C)] 97.6 F (36.4 C) (02/23 0540) Pulse Rate:  [61-72] 70 (02/23 0830) Resp:  [16] 16 (02/23 0540) BP: (98-132)/(40-62) 110/43 (02/23 0830) SpO2:  [98 %-100 %] 99 % (02/23 0540)  Intake/Output from previous day: 02/22 0701 - 02/23 0700 In: 880 [P.O.:880] Out: 150 [Urine:150] Intake/Output this shift: No intake/output data recorded.  Recent Labs    05/29/17 0533 05/30/17 0613  HGB 10.9* 9.8*   Recent Labs    05/29/17 0533 05/30/17 0613  WBC 14.6* 12.9*  RBC 3.34* 3.01*  HCT 31.8* 28.8*  PLT 157 139*   Recent Labs    05/29/17 0533  NA 133*  K 4.6  CL 103  CO2 18*  BUN 18  CREATININE 0.77  GLUCOSE 177*  CALCIUM 8.4*   No results for input(s): LABPT, INR in the last 72 hours.  Neurologically intact ABD soft Neurovascular intact Sensation intact distally Intact pulses distally Dorsiflexion/Plantar flexion intact Incision: dressing C/D/I and no drainage No cellulitis present Compartment soft no sign of DVT  Assessment/Plan: 2 Days Post-Op Procedure(s) (LRB): LEFT TOTAL KNEE ARTHROPLASTY WITH COMPUTER NAVIGATION (Left) Advance diet Up with therapy D/C IV fluids  Hold D/C today, likely dry, hypotensive from pain meds as well. Hgb 9.8 Will continue Vicodin for pain today which seems to be effective, monitor for hypotension, may need to change frequency/dosage.  Continue ASA for DVT ppx WBAT Plan  D/C home tomorrow  Cecilie Kicks. 05/30/2017, 8:51 AM

## 2017-05-30 NOTE — Progress Notes (Signed)
   05/30/17 1600  PT Visit Information  Last PT Received On 05/30/17  Assistance Needed +1  History of Present Illness 72 y.o. female admitted on 05/28/17 for elective L TKA.  Pt with significant PMH of R TKA (10/18), CABG, HTN, gout, DM, CAD, chronic diastolic CHF, cardiomyopathy, aortic regurgitation s/p valve replacement.     Subjective Data  Patient Stated Goal to get back to pain free walking and traveling  Precautions  Precautions Knee  Restrictions  LLE Weight Bearing WBAT  Pain Assessment  Pain Assessment 0-10  Pain Score 8  Pain Location left knee with gait and lower back in bed  Pain Descriptors / Indicators Sore  Pain Intervention(s) Premedicated before session;Monitored during session;Ice applied;Limited activity within patient's tolerance  Cognition  Arousal/Alertness Awake/alert  Behavior During Therapy WFL for tasks assessed/performed  Overall Cognitive Status Within Functional Limits for tasks assessed  Bed Mobility  Bed Mobility Supine to Sit  Supine to sit Modified independent (Device/Increase time)  Transfers  Equipment used Rolling walker (2 wheeled)  Transfers Sit to/from Stand  Sit to Stand Supervision;Modified independent (Device/Increase time)  Ambulation/Gait  Ambulation/Gait assistance Supervision  Ambulation Distance (Feet) 90 Feet  Assistive device Rolling walker (2 wheeled)  Gait Pattern/deviations Step-to pattern  General Gait Details step to advised d/t pain; no dizziness with amb this pm  Gait velocity decreased  Total Joint Exercises  Hip ABduction/ADduction AAROM;AROM;Left;10 reps  Straight Leg Raises AAROM;Left;10 reps;AROM  Goniometric ROM 5* to 75*  PT - End of Session  Activity Tolerance Patient tolerated treatment well  Patient left in chair;with call bell/phone within reach;with chair alarm set  Nurse Communication Mobility status  PT - Assessment/Plan  PT Plan Current plan remains appropriate  PT Visit Diagnosis Muscle weakness  (generalized) (M62.81);Difficulty in walking, not elsewhere classified (R26.2);Pain  Pain - Right/Left Left  Pain - part of body Knee  PT Frequency (ACUTE ONLY) 7X/week  Follow Up Recommendations Follow surgeon's recommendation for DC plan and follow-up therapies;Supervision for mobility/OOB  PT equipment None recommended by PT  AM-PAC PT "6 Clicks" Daily Activity Outcome Measure  Difficulty turning over in bed (including adjusting bedclothes, sheets and blankets)? 3  Difficulty moving from lying on back to sitting on the side of the bed?  3  Difficulty sitting down on and standing up from a chair with arms (e.g., wheelchair, bedside commode, etc,.)? 3  Help needed moving to and from a bed to chair (including a wheelchair)? 3  Help needed walking in hospital room? 3  Help needed climbing 3-5 steps with a railing?  3  6 Click Score 18  Mobility G Code  CK  PT Goal Progression  Progress towards PT goals Progressing toward goals  Acute Rehab PT Goals  PT Goal Formulation With patient/family  Time For Goal Achievement 06/11/17  Potential to Achieve Goals Good  PT Time Calculation  PT Start Time (ACUTE ONLY) 1449  PT Stop Time (ACUTE ONLY) 1503  PT Time Calculation (min) (ACUTE ONLY) 14 min  PT General Charges  $$ ACUTE PT VISIT 1 Visit  PT Treatments  $Gait Training 8-22 mins

## 2017-05-31 LAB — CBC
HCT: 29 % — ABNORMAL LOW (ref 36.0–46.0)
Hemoglobin: 9.8 g/dL — ABNORMAL LOW (ref 12.0–15.0)
MCH: 31.7 pg (ref 26.0–34.0)
MCHC: 33.8 g/dL (ref 30.0–36.0)
MCV: 93.9 fL (ref 78.0–100.0)
Platelets: 147 10*3/uL — ABNORMAL LOW (ref 150–400)
RBC: 3.09 MIL/uL — ABNORMAL LOW (ref 3.87–5.11)
RDW: 13.5 % (ref 11.5–15.5)
WBC: 10.1 10*3/uL (ref 4.0–10.5)

## 2017-05-31 LAB — GLUCOSE, CAPILLARY
Glucose-Capillary: 156 mg/dL — ABNORMAL HIGH (ref 65–99)
Glucose-Capillary: 183 mg/dL — ABNORMAL HIGH (ref 65–99)

## 2017-05-31 NOTE — Progress Notes (Signed)
Subjective: 3 Days Post-Op Procedure(s) (LRB): LEFT TOTAL KNEE ARTHROPLASTY WITH COMPUTER NAVIGATION (Left)  Patient reports pain as mild to moderate.  Tolerating POs well.  Admits to flatus.  Denies fever, chills, N/V, SOB, CP.  Has struggled with hypotension.  Carvedilol held yesterday.  Patient reports that she is feeling a little better this morning and thinks she is ready go home.  Daughter at bedside.  Objective:   VITALS:  Temp:  [97.6 F (36.4 C)-98.8 F (37.1 C)] 97.6 F (36.4 C) (02/24 0606) Pulse Rate:  [70-85] 75 (02/24 0606) Resp:  [18] 18 (02/24 0606) BP: (110-118)/(43-58) 113/58 (02/24 0606) SpO2:  [97 %-100 %] 97 % (02/24 0606)  General: WDWN patient in NAD. Psych:  Appropriate mood and affect. Neuro:  A&O x 3, Moving all extremities, sensation intact to light touch HEENT:  EOMs intact Chest:  Even non-labored respirations Skin:  Dressing C/D/I, no rashes or lesions Extremities: warm/dry, mild edema, no erythema or echymosis.  No lymphadenopathy. Pulses: Dorsalis pedis and post tibialis 2+ MSK:  ROM: lacks 5 degrees of TKE, MMT: able to perform quad set, (-) Homan's    LABS Recent Labs    05/29/17 0533 05/30/17 0613 05/31/17 0549  HGB 10.9* 9.8* 9.8*  WBC 14.6* 12.9* 10.1  PLT 157 139* 147*   Recent Labs    05/29/17 0533  NA 133*  K 4.6  CL 103  CO2 18*  BUN 18  CREATININE 0.77  GLUCOSE 177*   No results for input(s): LABPT, INR in the last 72 hours.   Assessment/Plan: 3 Days Post-Op Procedure(s) (LRB): LEFT TOTAL KNEE ARTHROPLASTY WITH COMPUTER NAVIGATION (Left)  -Patient seen in rounds for Dr. Lyla Glassing -WBAT LLE -BP this am 113/58.  Patient denies any dizziness.  Reports typical BP runs around 120/70 with BP medication.  Patient to hold Carvedilol and follow up with PCP next week.   -D/C home today -Scripts on chart -Plan for outpatient post-op visit with Dr. Riley Lam, [facility] Office:   [phone]

## 2017-05-31 NOTE — Progress Notes (Signed)
Physical Therapy Treatment Patient Details Name: [name] [name] MRN: [id number] DOB: [birth year] Today's Date: 05/31/2017    History of Present Illness 72 y.o. female admitted on 05/28/17 for elective L TKA.  Pt with significant PMH of R TKA (10/18), CABG, HTN, gout, DM, CAD, chronic diastolic CHF, cardiomyopathy, aortic regurgitation s/p valve replacement.       PT Comments    Pt doing well today but with more soreness in thigh, upper ACE wrap removed and pt reported incr comfort; pt has OPPT appointment tomorrow; pt feels ready to d/c home; pt did stairs yesterday and we verbally reviewed technique today; has HEP  Follow Up Recommendations  Follow surgeon's recommendation for DC plan and follow-up therapies;Supervision for mobility/OOB     Equipment Recommendations  None recommended by PT    Recommendations for Other Services       Precautions / Restrictions Precautions Precautions: Knee Restrictions Weight Bearing Restrictions: No LLE Weight Bearing: Weight bearing as tolerated    Mobility  Bed Mobility   Bed Mobility: Supine to Sit     Supine to sit: Modified independent (Device/Increase time)        Transfers Overall transfer level: Needs assistance Equipment used: Rolling walker (2 wheeled) Transfers: Sit to/from Stand Sit to Stand: Supervision;Modified independent (Device/Increase time)         General transfer comment: for safety  Ambulation/Gait Ambulation/Gait assistance: Supervision Ambulation Distance (Feet): 80 Feet Assistive device: Rolling walker (2 wheeled) Gait Pattern/deviations: Step-to pattern;Step-through pattern;Decreased stride length Gait velocity: decreased   General Gait Details: cues for step length, Rw position withturns; steady gait, no LOB   Stairs            Wheelchair Mobility    Modified Rankin (Stroke Patients Only)       Balance                                            Cognition  Arousal/Alertness: Awake/alert Behavior During Therapy: WFL for tasks assessed/performed Overall Cognitive Status: Within Functional Limits for tasks assessed                                        Exercises Total Joint Exercises Ankle Circles/Pumps: AROM;Both;20 reps Quad Sets: AROM;Both;10 reps Heel Slides: AAROM;Left;10 reps Hip ABduction/ADduction: AAROM;AROM;Left;10 reps Straight Leg Raises: AAROM;Left;10 reps;AROM Goniometric ROM: 5* to 60*, flexion limited by incr pain today    General Comments        Pertinent Vitals/Pain Pain Assessment: 0-10 Pain Score: 6  Pain Location: left knee with gait and lower back in bed Pain Descriptors / Indicators: Sore Pain Intervention(s): Monitored during session    Home Living                      Prior Function            PT Goals (current goals can now be found in the care plan section) Acute Rehab PT Goals Patient Stated Goal: to get back to pain free walking and traveling PT Goal Formulation: With patient/family Time For Goal Achievement: 06/11/17 Potential to Achieve Goals: Good Progress towards PT goals: Progressing toward goals    Frequency    7X/week      PT Plan Current plan remains appropriate  Co-evaluation              AM-PAC PT "6 Clicks" Daily Activity  Outcome Measure  Difficulty turning over in bed (including adjusting bedclothes, sheets and blankets)?: A Little Difficulty moving from lying on back to sitting on the side of the bed? : A Little Difficulty sitting down on and standing up from a chair with arms (e.g., wheelchair, bedside commode, etc,.)?: A Little Help needed moving to and from a bed to chair (including a wheelchair)?: A Little Help needed walking in hospital room?: A Little Help needed climbing 3-5 steps with a railing? : A Little 6 Click Score: 18    End of Session Equipment Utilized During Treatment: Gait belt Activity Tolerance: Patient  tolerated treatment well Patient left: in chair;with call bell/phone within reach;with family/visitor present Nurse Communication: Mobility status PT Visit Diagnosis: Muscle weakness (generalized) (M62.81);Difficulty in walking, not elsewhere classified (R26.2);Pain Pain - Right/Left: Left Pain - part of body: Knee     Time: 4462-8638 PT Time Calculation (min) (ACUTE ONLY): 34 min  Charges:  $Gait Training: 8-22 mins $Therapeutic Exercise: 8-22 mins                    G CodesKenyon Ana, PT Pager: [phone] 05/31/2017    [facility] 05/31/2017, 10:43 AM

## 2017-06-01 LAB — TYPE AND SCREEN
ABO/RH(D): O NEG
Antibody Screen: POSITIVE
Donor AG Type: NEGATIVE
Unit division: 0

## 2017-06-01 LAB — GLUCOSE, CAPILLARY: Glucose-Capillary: 339 mg/dL — ABNORMAL HIGH (ref 65–99)

## 2017-06-01 LAB — BPAM RBC
Blood Product Expiration Date: 201903122359
ISSUE DATE / TIME: 201902031727
Unit Type and Rh: 9500

## 2017-06-11 ENCOUNTER — Ambulatory Visit
Admission: RE | Admit: 2017-06-11 | Discharge: 2017-06-11 | Disposition: A | Discharge status: Home / Self Care | Payer: Medicare Other | Source: Ambulatory Visit | Attending: Geriatric Medicine | Admitting: Geriatric Medicine

## 2017-06-11 DIAGNOSIS — Z1231 Encounter for screening mammogram for malignant neoplasm of breast: Secondary | ICD-10-CM

## 2017-07-30 ENCOUNTER — Encounter: Payer: Self-pay | Admitting: Cardiology

## 2017-08-16 NOTE — Progress Notes (Signed)
Cardiology Office Note:    Date:  08/17/2017   ID:  ALEIGHNA WOJTAS, DOB Feb 02, 1946, MRN [id number]  PCP:  Lajean Manes, MD  Cardiologist:  Fransico Him, MD    Referring MD: Lajean Manes, MD   Chief Complaint  Patient presents with  . Aortic Insuffiency  . Hypertension  . Cardiomyopathy  . Coronary Artery Disease  . Hyperlipidemia    History of Present Illness:    [name] [name] is a 72 y.o. female with a hx of PVC's, dilated aortic root, severe AR, HTN, severe LV dysfunction with EF 25% (now normalized), severe mid LAD stenosis s/p 1 vessel CABG (LIMA to LAD) and Bentall procedure with aortic root replacement with pericardial tissue AVR .    She is here today for followup and is doing well.  She denies any chest pain or pressure, SOB, DOE, PND, orthopnea, LE edema, dizziness, palpitations or syncope. She is compliant with her meds and is tolerating meds with no SE.    Past Medical History:  Diagnosis Date  . Adenomatous colon polyp   . Aortic regurgitation    severe by TEE s/p pericardia tissue AVR  . Arthritis   . Cardiomyopathy ([place])    EF initally 25% but now normalized  . Carotid stenosis 08/17/2017   1-39% bilateral by dopplers 2015  . Chronic diastolic CHF (congestive heart failure), NYHA class 2 ([place])   . Coronary artery disease 10/05/2013   Severe mid LAD stenosis s/p 1 vessel CABG with LIMA to LAD  . Diabetes mellitus without complication ([place])    DX 20 YRS AGO.   TYPE 2  . Dilated aortic root ([place])   . GERD (gastroesophageal reflux disease)    Tums prn  . Gout   . Gout   . Heart murmur   . Hypercholesteremia    LDL goal < 70  . Hypertension   . S/P Bio-Bentall aortic root replacement with bioprosthetic valve conduit 10/27/2013   23 mm [facility] Ease bovine pericardial tissue valve and 26 mm Vascutek gelweave Val-Salva aortic graft  . S/P CABG x 1 10/27/2013   LIMA to LAD  . Thyroiditis 10/2009   lab and u/s-thyroid function normalized in 9/11     Past Surgical History:  Procedure Laterality Date  . ABDOMINAL HYSTERECTOMY    . AORTIC VALVE REPLACEMENT N/A 10/27/2013   Procedure: AORTIC VALVE REPLACEMENT (AVR);  Surgeon: Rexene Alberts, MD;  Location: [place];  Service: Open Heart Surgery;  Laterality: N/A;  . ASCENDING AORTIC ROOT REPLACEMENT N/A 10/27/2013   Procedure: ASCENDING AORTIC ROOT REPLACEMENT;  Surgeon: Rexene Alberts, MD;  Location: [place];  Service: Open Heart Surgery;  Laterality: N/A;  . BUNIONECTOMY     bilateral  . CARDIAC CATHETERIZATION    . CARDIAC VALVE REPLACEMENT     AORTIC VALVE  . CARPAL TUNNEL RELEASE Right   . CATARACT EXTRACTION Bilateral   . COLONOSCOPY    . COLONOSCOPY WITH PROPOFOL N/A 05/08/2015   Procedure: COLONOSCOPY WITH PROPOFOL;  Surgeon: Garlan Fair, MD;  Location: [facility];  Service: Endoscopy;  Laterality: N/A;  . CORONARY ARTERY BYPASS GRAFT N/A 10/27/2013   Procedure: CORONARY ARTERY BYPASS GRAFTING (CABG) x 1 - LIMA to LAD;  Surgeon: Rexene Alberts, MD;  Location: [place];  Service: Open Heart Surgery;  Laterality: N/A;  . EYE SURGERY     BILATERAL CATARACTS  . INTRAOPERATIVE TRANSESOPHAGEAL ECHOCARDIOGRAM N/A 10/27/2013   Procedure: INTRAOPERATIVE TRANSESOPHAGEAL ECHOCARDIOGRAM;  Surgeon: Braulio Conte  Keturah Barre, MD;  Location: [place];  Service: Open Heart Surgery;  Laterality: N/A;  . JOINT REPLACEMENT     left total knee  . KNEE ARTHROPLASTY Right 01/26/2017   Procedure: RIGHT TOTAL KNEE ARTHROPLASTY WITH COMPUTER NAVIGATION;  Surgeon: Rod Can, MD;  Location: [place];  Service: Orthopedics;  Laterality: Right;  Needs RNFA  . KNEE ARTHROPLASTY Left 05/28/2017   Procedure: LEFT TOTAL KNEE ARTHROPLASTY WITH COMPUTER NAVIGATION;  Surgeon: Rod Can, MD;  Location: [facility];  Service: Orthopedics;  Laterality: Left;  Needs RNFA  . LEFT AND RIGHT HEART CATHETERIZATION WITH CORONARY ANGIOGRAM N/A 10/05/2013   Procedure: LEFT AND RIGHT HEART CATHETERIZATION WITH CORONARY ANGIOGRAM;   Surgeon: Blane Ohara, MD;  Location[facility] C[facility] LAB;  Service: Cardiovascular;  Laterality: N/A;  . TEE WITHOUT CARDIOVERSION N/A 10/04/2013   Procedure: TRANSESOPHAGEAL ECHOCARDIOGRAM (TEE);  Surgeon: Sueanne Margarita, MD;  Location[facility] ENDOSCOPY;  Service: Cardiovascular;  Laterality: N/A;  . tubular adenomatous polyp colonoscopy  12/31/2009    Current Medications: Current Meds  Medication Sig  . aspirin 81 MG chewable tablet Chew 1 tablet (81 mg total) by mouth 2 (two) times daily.  Marland Kitchen atorvastatin (LIPITOR) 10 MG tablet TAKE 1 TABLET BY MOUTH EVERY DAY AT 6PM. Please make yearly appt with Dr. Radford Pax for March before anymore refills. 1st attempt  . Calcium Carb-Cholecalciferol (CALCIUM 600-D PO) Take 1 tablet by mouth daily.  . clindamycin (CLEOCIN) 150 MG capsule Take 4 capsules by mouth as needed. (Take one (1) hour prior to dental procedures)  . colchicine (COLCRYS) 0.6 MG tablet Take 0.6 mg by mouth daily.  Marland Kitchen docusate sodium (COLACE) 100 MG capsule Take 1 capsule (100 mg total) by mouth 2 (two) times daily.  . fluticasone (FLONASE) 50 MCG/ACT nasal spray Place 1 spray into both nostrils 2 (two) times daily.  Marland Kitchen GLUCOSAMINE-CHONDROITIN PO Take 1 tablet by mouth daily. Glucosamine 1200 mg, chondroitin 1500 mg  . HYDROcodone-acetaminophen (NORCO/VICODIN) 5-325 MG tablet Take 1-2 tablets by mouth every 6 (six) hours as needed.  Marland Kitchen losartan (COZAAR) 25 MG tablet Take 1 tablet (25 mg total) by mouth daily.  . metFORMIN (GLUCOPHAGE-XR) 500 MG 24 hr tablet Take 1,000 mg by mouth at bedtime.   . Multiple Vitamin (MULTIVITAMIN WITH MINERALS) TABS tablet Take 1 tablet by mouth daily. Centrum Silver  . naproxen sodium (ALEVE) 220 MG tablet Take 440 mg by mouth daily as needed (headaches).  . ondansetron (ZOFRAN) 4 MG tablet Take 1 tablet (4 mg total) by mouth every 6 (six) hours as needed for nausea.  . ONE TOUCH ULTRA TEST test strip   . pramipexole (MIRAPEX) 0.125 MG tablet Take 0.125 mg by mouth at  bedtime.  . ranitidine (ZANTAC) 150 MG tablet Take 150 mg by mouth daily as needed for heartburn.  . senna (SENOKOT) 8.6 MG TABS tablet Take 2 tablets (17.2 mg total) by mouth at bedtime.     Allergies:   Atenolol; Lisinopril; Penicillins; and Sulfa antibiotics   Social History   Socioeconomic History  . Marital status: Married    Spouse name: Not on file  . Number of children: Not on file  . Years of education: Not on file  . Highest education level: Not on file  Occupational History  . Not on file  Social Needs  . Financial resource strain: Not on file  . Food insecurity:    Worry: Not on file    Inability: Not on file  . Transportation needs:  Medical: Not on file    Non-medical: Not on file  Tobacco Use  . Smoking status: Never Smoker  . Smokeless tobacco: Never Used  Substance and Sexual Activity  . Alcohol use: No  . Drug use: No  . Sexual activity: Not on file  Lifestyle  . Physical activity:    Days per week: Not on file    Minutes per session: Not on file  . Stress: Not on file  Relationships  . Social connections:    Talks on phone: Not on file    Gets together: Not on file    Attends religious service: Not on file    Active member of club or organization: Not on file    Attends meetings of clubs or organizations: Not on file    Relationship status: Not on file  Other Topics Concern  . Not on file  Social History Narrative  . Not on file     Family History: The patient's family history includes Arthritis/Rheumatoid in her mother; Cancer in her brother; Diabetes in her mother; Heart attack in her father; Hypertension in her mother; Stroke in her sister.  ROS:   Please see the history of present illness.    ROS  All other systems reviewed and negative.   EKGs/Labs/Other Studies Reviewed:    The following studies were reviewed today: none  EKG:  EKG is not ordered today.    Recent Labs: 05/29/2017: BUN 18; Creatinine, Ser 0.77; Potassium 4.6;  Sodium 133 05/31/2017: Hemoglobin 9.8; Platelets 147   Recent Lipid Panel No results found for: CHOL, TRIG, HDL, CHOLHDL, VLDL, LDLCALC, LDLDIRECT  Physical Exam:    VS:  BP 118/60   Pulse 76   Ht 5\' 1"  (1.549 m)   Wt 165 lb 6.4 oz (75 kg)   SpO2 98%   BMI 31.25 kg/m     Wt Readings from Last 3 Encounters:  08/17/17 165 lb 6.4 oz (75 kg)  05/28/17 166 lb (75.3 kg)  05/25/17 166 lb 12.8 oz (75.7 kg)     GEN:  Well nourished, well developed in no acute distress HEENT: Normal NECK: No JVD; No carotid bruits LYMPHATICS: No lymphadenopathy CARDIAC: RRR, no rubs, gallops.  1/6 SM at RUSB to carotids RESPIRATORY:  Clear to auscultation without rales, wheezing or rhonchi  ABDOMEN: Soft, non-tender, non-distended MUSCULOSKELETAL:  No edema; No deformity  SKIN: Warm and dry NEUROLOGIC:  Alert and oriented x 3 PSYCHIATRIC:  Normal affect   ASSESSMENT:    1. Coronary artery disease involving native coronary artery of native heart without angina pectoris   2. Essential hypertension, benign   3. Nonrheumatic aortic valve insufficiency   4. Chronic diastolic CHF (congestive heart failure), NYHA class 2 ([place])   5. Dyslipidemia, goal LDL below 70   6. Bilateral carotid artery stenosis    PLAN:    In order of problems listed above:  1.  ASCAD  - severe mid LAD stenosis s/p 1 vessel CABG (LIMA to LAD) 2015.  She will continue on ASA 81mg  daily and statin  2.  HTN -blood pressure is well controlled on exam today. She will continue on losartan 25 mg daily.  Creatinine was stable at 0.77 on labs 05/29/2017.  3.  Severe aortic insufficiency -  S/p Bentall procedure with aortic root replacement with pericardial tissue AVR. Echo 2017 showed stable AVR.  4.  Chronic diastolic heart failure -she appears euvolemic on exam today and weight is stable. She has not required diuretic  therapy.  5.  Hyperlipidemia with LDL goal less than 70. -She will continue on she will continue on atorvastatin  10 mg daily. LDL was at goal 12/26/2017 at 37.  6.  Bilateral carotid artery stenosis - dopplers 2015 showed 1-39% stenosis - I will repeat dopplers since I hear a bruit on exam.  Continue ASA and statin.    Medication Adjustments/Labs and Tests Ordered: Current medicines are reviewed at length with the patient today.  Concerns regarding medicines are outlined above.  No orders of the defined types were placed in this encounter.  No orders of the defined types were placed in this encounter.   Signed, Fransico Him, MD  08/17/2017 9:38 AM    [place]

## 2017-08-17 ENCOUNTER — Encounter: Payer: Self-pay | Admitting: Cardiology

## 2017-08-17 ENCOUNTER — Ambulatory Visit: Payer: Medicare Other | Admitting: Cardiology

## 2017-08-17 VITALS — BP 118/60 | HR 76 | Ht 61.0 in | Wt 165.4 lb

## 2017-08-17 DIAGNOSIS — E785 Hyperlipidemia, unspecified: Secondary | ICD-10-CM

## 2017-08-17 DIAGNOSIS — I351 Nonrheumatic aortic (valve) insufficiency: Secondary | ICD-10-CM | POA: Diagnosis not present

## 2017-08-17 DIAGNOSIS — I6523 Occlusion and stenosis of bilateral carotid arteries: Secondary | ICD-10-CM

## 2017-08-17 DIAGNOSIS — I2510 Atherosclerotic heart disease of native coronary artery without angina pectoris: Secondary | ICD-10-CM

## 2017-08-17 DIAGNOSIS — I6529 Occlusion and stenosis of unspecified carotid artery: Secondary | ICD-10-CM

## 2017-08-17 DIAGNOSIS — I5032 Chronic diastolic (congestive) heart failure: Secondary | ICD-10-CM

## 2017-08-17 DIAGNOSIS — I10 Essential (primary) hypertension: Secondary | ICD-10-CM

## 2017-08-17 HISTORY — DX: Occlusion and stenosis of unspecified carotid artery: I65.29

## 2017-08-17 NOTE — Patient Instructions (Signed)
Medication Instructions:  Your physician recommends that you continue on your current medications as directed. Please refer to the Current Medication list given to you today.  If you need a refill on your cardiac medications, please contact your pharmacy first.  Labwork: None ordered   Testing/Procedures: Your physician has requested that you have a carotid duplex. This test is an ultrasound of the carotid arteries in your neck. It looks at blood flow through these arteries that supply the brain with blood. Allow one hour for this exam. There are no restrictions or special instructions.  Follow-Up: Your physician wants you to follow-up in: 1 year with Dr. Radford Pax. You will receive a reminder letter in the mail two months in advance. If you don't receive a letter, please call our office to schedule the follow-up appointment.  Any Other Special Instructions Will Be Listed Below (If Applicable).   Thank you for choosing [facility], RN  [phone]  If you need a refill on your cardiac medications before your next appointment, please call your pharmacy.

## 2017-08-19 ENCOUNTER — Ambulatory Visit (HOSPITAL_COMMUNITY)
Admission: RE | Admit: 2017-08-19 | Discharge: 2017-08-19 | Disposition: A | Discharge status: Home / Self Care | Payer: Medicare Other | Source: Ambulatory Visit | Attending: Internal Medicine | Admitting: Internal Medicine

## 2017-08-19 ENCOUNTER — Encounter: Payer: Self-pay | Admitting: Cardiology

## 2017-08-19 DIAGNOSIS — I6523 Occlusion and stenosis of bilateral carotid arteries: Secondary | ICD-10-CM | POA: Diagnosis not present

## 2017-08-19 DIAGNOSIS — I2510 Atherosclerotic heart disease of native coronary artery without angina pectoris: Secondary | ICD-10-CM | POA: Insufficient documentation

## 2017-08-19 DIAGNOSIS — I10 Essential (primary) hypertension: Secondary | ICD-10-CM | POA: Insufficient documentation

## 2017-08-21 ENCOUNTER — Telehealth: Payer: Self-pay

## 2017-08-21 DIAGNOSIS — I6523 Occlusion and stenosis of bilateral carotid arteries: Secondary | ICD-10-CM

## 2017-08-21 NOTE — Telephone Encounter (Signed)
Notes recorded by Teressa Senter, RN on 08/21/2017 at 9:44 AM EDT Left detailed message of carotid US results on home phone. Carotid US ordered to be scheduled in 2 years.   Notes recorded by Sueanne Margarita, MD on 08/19/2017 at 3:00 PM EDT 1-39% bilateral carotid dopplers - repeat study in 2 years

## 2017-08-22 ENCOUNTER — Other Ambulatory Visit: Payer: Self-pay | Admitting: Cardiology

## 2017-12-17 ENCOUNTER — Other Ambulatory Visit: Payer: Self-pay | Admitting: Cardiology

## 2018-01-27 ENCOUNTER — Telehealth: Payer: Self-pay | Admitting: Cardiology

## 2018-01-27 NOTE — Telephone Encounter (Signed)
Her d/c note from February instructed her to stop carvedilol but if she has continued to take it since then would continue on it since her BP was good at last OV in May

## 2018-01-27 NOTE — Telephone Encounter (Signed)
  Pt c/o medication issue:  1. Name of Medication: carvedilol  2. How are you currently taking this medication (dosage and times per day)? 2 times daily, 6.25 mg  3. Are you having a reaction (difficulty breathing--STAT)?  No  4. What is your medication issue?  Patient had requested refill at pharmacy and pharmacist told her that she could not get a refill from [place]. Medication is not on med list. Pt started taking medicine after her knee surgery back in February and was never told to stop taking it. Wants to know if she is no longer supposed to be taking this medication.

## 2018-01-27 NOTE — Telephone Encounter (Signed)
Spoke with the patient about her Carvedilol, in February she had a surgery and on the discharge Dr. Lyla Glassing discontinued Carvedilol 6. 25 mg. The patient saw Dr. Radford Pax and her recommendations were to continue Losartan. Since the hospital discharge the patient continued to take carvedilol, because she was unaware of the medication being stopped. She states her blood pressure has been great. She is requesting a new prescription.   Sending to Dr. Radford Pax.

## 2018-01-28 MED ORDER — CARVEDILOL 6.25 MG PO TABS: 6.2500 mg | ORAL_TABLET | Freq: Two times a day (BID) | ORAL | 3 refills | Status: DC

## 2018-01-28 NOTE — Telephone Encounter (Signed)
Left message for patient to call back, sent prescription of carvedilol to her pharmacy.

## 2018-01-29 NOTE — Telephone Encounter (Signed)
Spoke with the patient, she picked up the medication last night and started, she expressed understanding and had no further questions.

## 2018-05-03 ENCOUNTER — Other Ambulatory Visit: Payer: Self-pay | Admitting: Geriatric Medicine

## 2018-05-03 DIAGNOSIS — Z1231 Encounter for screening mammogram for malignant neoplasm of breast: Secondary | ICD-10-CM

## 2018-07-09 ENCOUNTER — Other Ambulatory Visit: Payer: Self-pay | Admitting: Cardiology

## 2018-08-10 ENCOUNTER — Other Ambulatory Visit: Payer: Self-pay | Admitting: Geriatric Medicine

## 2018-08-10 ENCOUNTER — Telehealth: Payer: Self-pay | Admitting: Cardiology

## 2018-08-10 DIAGNOSIS — N649 Disorder of breast, unspecified: Secondary | ICD-10-CM

## 2018-08-10 NOTE — Telephone Encounter (Signed)
Virtual Visit Pre-Appointment Phone Call  "(Name), I am calling you today to discuss your upcoming appointment. We are currently trying to limit exposure to the virus that causes COVID-19 by seeing patients at home rather than in the office."  1. "What is the BEST phone number to call the day of the visit?" - include this in appointment notes  2. Do you have or have access to (through a family member/friend) a smartphone with video capability that we can use for your visit?" a. If yes - list this number in appt notes as cell (if different from BEST phone #) and list the appointment type as a VIDEO visit in appointment notes b. If no - list the appointment type as a PHONE visit in appointment notes  Confirm consent - "In the setting of the current Covid19 crisis, you are scheduled for a (phone or video) visit with your provider on (date) at (time).  Just as we do with many in-office visits, in order for you to participate in this visit, we must obtain consent.  If you'd like, I can send this to your mychart (if signed up) or email for you to review.  Otherwise, I can obtain your verbal consent now.  All virtual visits are billed to your insurance company just like a normal visit would be.  By agreeing to a virtual visit, we'd like you to understand that the technology does not allow for your provider to perform an examination, and thus may limit your provider's ability to fully assess your condition. If your provider identifies any concerns that need to be evaluated in person, we will make arrangements to do so.  Finally, though the technology is pretty good, we cannot assure that it will always work on either your or our end, and in the setting of a video visit, we may have to convert it to a phone-only visit.  In either situation, we cannot ensure that we have a secure connection.  Are you willing to proceed?"   Yes/Virtal/doxy.me/smartphone [phone]/verbal consent 08/10/18  3. Advise  patient to be prepared - "Two hours prior to your appointment, go ahead and check your blood pressure, pulse, oxygen saturation, and your weight (if you have the equipment to check those) and write them all down. When your visit starts, your provider will ask you for this information. If you have an Apple Watch or Kardia device, please plan to have heart rate information ready on the day of your appointment. Please have a pen and paper handy nearby the day of the visit as well."  4. Give patient instructions for MyChart download to smartphone OR Doximity/Doxy.me as below if video visit (depending on what platform provider is using)  5. Inform patient they will receive a phone call 15 minutes prior to their appointment time (may be from unknown caller ID) so they should be prepared to answer    TELEPHONE CALL NOTE  [name] [name] has been deemed a candidate for a follow-up tele-health visit to limit community exposure during the Covid-19 pandemic. I spoke with the patient via phone to ensure availability of phone/video source, confirm preferred email & phone number, and discuss instructions and expectations.  I reminded [name] [name] to be prepared with any vital sign and/or heart rhythm information that could potentially be obtained via home monitoring, at the time of her visit. I reminded [name] [name] to expect a phone call prior to her visit.  Armando Gang 08/10/2018  4:38 PM   INSTRUCTIONS FOR DOWNLOADING THE MYCHART APP TO SMARTPHONE  - The patient must first make sure to have activated MyChart and know their login information - If Apple, go to [employer] and type in MyChart in the search bar and download the app. If Android, ask patient to go to [employer] and type in [place] in the search bar and download the app. The app is free but as with any other app downloads, their phone may require them to verify saved payment information or Apple/Android password.  - The patient  will need to then log into the app with their MyChart username and password, and select [facility] as their healthcare provider to link the account. When it is time for your visit, go to the MyChart app, find appointments, and click Begin Video Visit. Be sure to Select Allow for your device to access the Microphone and Camera for your visit. You will then be connected, and your provider will be with you shortly.  **If they have any issues connecting, or need assistance please contact MyChart service desk [phone])**  **If using a computer, in order to ensure the best quality for their visit they will need to use either of the following Internet Browsers: [employer], or Google Chrome**  IF USING DOXIMITY or DOXY.ME - The patient will receive a link just prior to their visit by text.     FULL LENGTH CONSENT FOR TELE-HEALTH VISIT   I hereby voluntarily request, consent and authorize [facility] and its employed or contracted physicians, physician assistants, nurse practitioners or other licensed health care professionals (the Practitioner), to provide me with telemedicine health care services (the Services") as deemed necessary by the treating Practitioner. I acknowledge and consent to receive the Services by the Practitioner via telemedicine. I understand that the telemedicine visit will involve communicating with the Practitioner through live audiovisual communication technology and the disclosure of certain medical information by electronic transmission. I acknowledge that I have been given the opportunity to request an in-person assessment or other available alternative prior to the telemedicine visit and am voluntarily participating in the telemedicine visit.  I understand that I have the right to withhold or withdraw my consent to the use of telemedicine in the course of my care at any time, without affecting my right to future care or treatment, and that the Practitioner  or I may terminate the telemedicine visit at any time. I understand that I have the right to inspect all information obtained and/or recorded in the course of the telemedicine visit and may receive copies of available information for a reasonable fee.  I understand that some of the potential risks of receiving the Services via telemedicine include:   Delay or interruption in medical evaluation due to technological equipment failure or disruption;  Information transmitted may not be sufficient (e.g. poor resolution of images) to allow for appropriate medical decision making by the Practitioner; and/or   In rare instances, security protocols could fail, causing a breach of personal health information.  Furthermore, I acknowledge that it is my responsibility to provide information about my medical history, conditions and care that is complete and accurate to the best of my ability. I acknowledge that Practitioner's advice, recommendations, and/or decision may be based on factors not within their control, such as incomplete or inaccurate data provided by me or distortions of diagnostic images or specimens that may result from electronic transmissions. I understand that the practice of medicine is  not an Chief Strategy Officer and that Practitioner makes no warranties or guarantees regarding treatment outcomes. I acknowledge that I will receive a copy of this consent concurrently upon execution via email to the email address I last provided but may also request a printed copy by calling the office of [facility].    I understand that my insurance will be billed for this visit.   I have read or had this consent read to me.  I understand the contents of this consent, which adequately explains the benefits and risks of the Services being provided via telemedicine.   I have been provided ample opportunity to ask questions regarding this consent and the Services and have had my questions answered to my  satisfaction.  I give my informed consent for the services to be provided through the use of telemedicine in my medical care  By participating in this telemedicine visit I agree to the above.

## 2018-08-10 NOTE — Progress Notes (Deleted)
Virtual Visit via Video Note   This visit type was conducted due to national recommendations for restrictions regarding the COVID-19 Pandemic (e.g. social distancing) in an effort to limit this patient's exposure and mitigate transmission in our community.  Due to her co-morbid illnesses, this patient is at least at moderate risk for complications without adequate follow up.  This format is felt to be most appropriate for this patient at this time.  All issues noted in this document were discussed and addressed.  A limited physical exam was performed with this format.  Please refer to the patient's chart for her consent to telehealth for [facility].  Evaluation Performed:  Follow-up visit  This visit type was conducted due to national recommendations for restrictions regarding the COVID-19 Pandemic (e.g. social distancing).  This format is felt to be most appropriate for this patient at this time.  All issues noted in this document were discussed and addressed.  No physical exam was performed (except for noted visual exam findings with Video Visits).  Please refer to the patient's chart (MyChart message for video visits and phone note for telephone visits) for the patient's consent to telehealth for [facility].  Date:  08/10/2018   ID:  [name] [name], [name] [name] 06/03/1945, MRN [id number]  Patient Location:  Home  Provider location:   [place]  PCP:  Lajean Manes, MD  Cardiologist:  Fransico Him, MD  Electrophysiologist:  None   Chief Complaint:  PVCs, AR, HTN and CAD  History of Present Illness:    [name] [name] is a 73 y.o. female who presents via audio/video conferencing for a telehealth visit today.    [name] [name] is a 73 y.o. female with a hx of PVC's, dilated aortic root,severeAR, HTN, severe LV dysfunction with EF 25% (now normalized), severe mid LAD stenosis s/p 1 vessel CABG (LIMA to LAD) and Bentall procedure with aortic root replacement with pericardial tissue  AVR .    She is here today for followup and is doing well.  She denies any chest pain or pressure, SOB, DOE, PND, orthopnea, LE edema, dizziness, palpitations or syncope. She is compliant with her meds and is tolerating meds with no SE.    The patient does not have symptoms concerning for COVID-19 infection (fever, chills, cough, or new shortness of breath).    Prior CV studies:   The following studies were reviewed today:  none  Past Medical History:  Diagnosis Date  . Adenomatous colon polyp   . Aortic regurgitation    severe by TEE s/p pericardia tissue AVR  . Arthritis   . Cardiomyopathy ([place])    EF initally 25% but now normalized  . Carotid stenosis 08/17/2017   1-39% bilateral by dopplers 2019  . Chronic diastolic CHF (congestive heart failure), NYHA class 2 ([place])   . Coronary artery disease 10/05/2013   Severe mid LAD stenosis s/p 1 vessel CABG with LIMA to LAD  . Diabetes mellitus without complication ([place])    DX 20 YRS AGO.   TYPE 2  . Dilated aortic root ([place])   . GERD (gastroesophageal reflux disease)    Tums prn  . Gout   . Gout   . Heart murmur   . Hypercholesteremia    LDL goal < 70  . Hypertension   . S/P Bio-Bentall aortic root replacement with bioprosthetic valve conduit 10/27/2013   23 mm [facility] Ease bovine pericardial tissue valve and 26 mm [place] gelweave Val-Salva aortic graft  .  S/P CABG x 1 10/27/2013   LIMA to LAD  . Thyroiditis 10/2009   lab and u/s-thyroid function normalized in 9/11   Past Surgical History:  Procedure Laterality Date  . ABDOMINAL HYSTERECTOMY    . AORTIC VALVE REPLACEMENT N/A 10/27/2013   Procedure: AORTIC VALVE REPLACEMENT (AVR);  Surgeon: Rexene Alberts, MD;  Location: [place];  Service: Open Heart Surgery;  Laterality: N/A;  . ASCENDING AORTIC ROOT REPLACEMENT N/A 10/27/2013   Procedure: ASCENDING AORTIC ROOT REPLACEMENT;  Surgeon: Rexene Alberts, MD;  Location: [place];  Service: Open Heart Surgery;  Laterality: N/A;   . BUNIONECTOMY     bilateral  . CARDIAC CATHETERIZATION    . CARDIAC VALVE REPLACEMENT     AORTIC VALVE  . CARPAL TUNNEL RELEASE Right   . CATARACT EXTRACTION Bilateral   . COLONOSCOPY    . COLONOSCOPY WITH PROPOFOL N/A 05/08/2015   Procedure: COLONOSCOPY WITH PROPOFOL;  Surgeon: Garlan Fair, MD;  Location: [facility];  Service: Endoscopy;  Laterality: N/A;  . CORONARY ARTERY BYPASS GRAFT N/A 10/27/2013   Procedure: CORONARY ARTERY BYPASS GRAFTING (CABG) x 1 - LIMA to LAD;  Surgeon: Rexene Alberts, MD;  Location: [place];  Service: Open Heart Surgery;  Laterality: N/A;  . EYE SURGERY     BILATERAL CATARACTS  . INTRAOPERATIVE TRANSESOPHAGEAL ECHOCARDIOGRAM N/A 10/27/2013   Procedure: INTRAOPERATIVE TRANSESOPHAGEAL ECHOCARDIOGRAM;  Surgeon: Rexene Alberts, MD;  Location: [place];  Service: Open Heart Surgery;  Laterality: N/A;  . JOINT REPLACEMENT     left total knee  . KNEE ARTHROPLASTY Right 01/26/2017   Procedure: RIGHT TOTAL KNEE ARTHROPLASTY WITH COMPUTER NAVIGATION;  Surgeon: Rod Can, MD;  Location: [facility];  Service: Orthopedics;  Laterality: Right;  Needs RNFA  . KNEE ARTHROPLASTY Left 05/28/2017   Procedure: LEFT TOTAL KNEE ARTHROPLASTY WITH COMPUTER NAVIGATION;  Surgeon: Rod Can, MD;  Location: [facility];  Service: Orthopedics;  Laterality: Left;  Needs RNFA  . LEFT AND RIGHT HEART CATHETERIZATION WITH CORONARY ANGIOGRAM N/A 10/05/2013   Procedure: LEFT AND RIGHT HEART CATHETERIZATION WITH CORONARY ANGIOGRAM;  Surgeon: Blane Ohara, MD;  Location: [facility] CATH LAB;  Service: Cardiovascular;  Laterality: N/A;  . TEE WITHOUT CARDIOVERSION N/A 10/04/2013   Procedure: TRANSESOPHAGEAL ECHOCARDIOGRAM (TEE);  Surgeon: Sueanne Margarita, MD;  Location: [facility] ENDOSCOPY;  Service: Cardiovascular;  Laterality: N/A;  . tubular adenomatous polyp colonoscopy  12/31/2009     No outpatient medications have been marked as taking for the 08/11/18 encounter (Appointment) with Sueanne Margarita, MD.      Allergies:   Atenolol; Lisinopril; Penicillins; and Sulfa antibiotics   Social History   Tobacco Use  . Smoking status: Never Smoker  . Smokeless tobacco: Never Used  Substance Use Topics  . Alcohol use: No  . Drug use: No     Family Hx: The patient's family history includes Arthritis/Rheumatoid in her mother; Cancer in her brother; Diabetes in her mother; Heart attack in her father; Hypertension in her mother; Stroke in her sister.  ROS:   Please see the history of present illness.     All other systems reviewed and are negative.   Labs/Other Tests and Data Reviewed:    Recent Labs: No results found for requested labs within last 8760 hours.   Recent Lipid Panel No results found for: CHOL, TRIG, HDL, CHOLHDL, LDLCALC, LDLDIRECT  Wt Readings from Last 3 Encounters:  08/17/17 165 lb 6.4 oz (75 kg)  05/28/17 166 lb (75.3 kg)  05/25/17 166 lb 12.8 oz (75.7 kg)     Objective:    Vital Signs:  There were no vitals taken for this visit.   CONSTITUTIONAL:  Well nourished, well developed female in no acute distress.  EYES: anicteric MOUTH: oral mucosa is pink RESPIRATORY: Normal respiratory effort, symmetric expansion CARDIOVASCULAR: No peripheral edema SKIN: No rash, lesions or ulcers MUSCULOSKELETAL: no digital cyanosis NEURO: Cranial Nerves II-XII grossly intact, moves all extremities PSYCH: Intact judgement and insight.  A&O x 3, Mood/affect appropriate   ASSESSMENT & PLAN:    1.  ASCAD  2.  HTN  3.  Severe aortic insufficiency  4.  Chronic diastolic CHF  5.  Hyperlipidemia  6.  Bilateral carotid artery stenosis  7.  COVID-19 Education:The signs and symptoms of COVID-19 were discussed with the patient and how to seek care for testing (follow up with PCP or arrange E-visit).  The importance of social distancing was discussed today.  Patient Risk:   After full review of this patient's clinical status, I feel that they are at least moderate risk at  this time.  Time:   Today, I have spent *** minutes directly with the patient on Video discussing medical problems including carotid stenosis, lipid, CHF, severe AR and CAD.  We also reviewed the symptoms of COVID 19 and the ways to protect against contracting the virus with telehealth technology.  I spent an additional 5 minutes reviewing patient's chart including prior notes.  Medication Adjustments/Labs and Tests Ordered: Current medicines are reviewed at length with the patient today.  Concerns regarding medicines are outlined above.  Tests Ordered: No orders of the defined types were placed in this encounter.  Medication Changes: No orders of the defined types were placed in this encounter.   Disposition:  Follow up in 1 year(s)  Signed, Fransico Him, MD  08/10/2018 9:29 PM    [place]

## 2018-08-11 ENCOUNTER — Telehealth: Payer: Medicare Other | Admitting: Cardiology

## 2018-08-11 ENCOUNTER — Other Ambulatory Visit: Payer: Self-pay

## 2018-08-16 ENCOUNTER — Other Ambulatory Visit: Payer: Self-pay | Admitting: Cardiology

## 2018-08-16 ENCOUNTER — Encounter: Payer: Self-pay | Admitting: Cardiology

## 2018-08-16 ENCOUNTER — Other Ambulatory Visit: Payer: Self-pay

## 2018-08-16 ENCOUNTER — Telehealth (INDEPENDENT_AMBULATORY_CARE_PROVIDER_SITE_OTHER): Payer: Medicare Other | Admitting: Cardiology

## 2018-08-16 VITALS — BP 122/82 | HR 70 | Ht 61.0 in | Wt 172.0 lb

## 2018-08-16 DIAGNOSIS — I6523 Occlusion and stenosis of bilateral carotid arteries: Secondary | ICD-10-CM | POA: Diagnosis not present

## 2018-08-16 DIAGNOSIS — I351 Nonrheumatic aortic (valve) insufficiency: Secondary | ICD-10-CM

## 2018-08-16 DIAGNOSIS — Z953 Presence of xenogenic heart valve: Secondary | ICD-10-CM

## 2018-08-16 DIAGNOSIS — I10 Essential (primary) hypertension: Secondary | ICD-10-CM

## 2018-08-16 DIAGNOSIS — I5032 Chronic diastolic (congestive) heart failure: Secondary | ICD-10-CM

## 2018-08-16 DIAGNOSIS — I2510 Atherosclerotic heart disease of native coronary artery without angina pectoris: Secondary | ICD-10-CM | POA: Diagnosis not present

## 2018-08-16 DIAGNOSIS — E785 Hyperlipidemia, unspecified: Secondary | ICD-10-CM

## 2018-08-16 NOTE — Progress Notes (Signed)
Virtual Visit via Video Note   This visit type was conducted due to national recommendations for restrictions regarding the COVID-19 Pandemic (e.g. social distancing) in an effort to limit this patient's exposure and mitigate transmission in our community.  Due to her co-morbid illnesses, this patient is at least at moderate risk for complications without adequate follow up.  This format is felt to be most appropriate for this patient at this time.  All issues noted in this document were discussed and addressed.  A limited physical exam was performed with this format.  Please refer to the patient's chart for her consent to telehealth for [facility].  Evaluation Performed:  Follow-up visit  This visit type was conducted due to national recommendations for restrictions regarding the COVID-19 Pandemic (e.g. social distancing).  This format is felt to be most appropriate for this patient at this time.  All issues noted in this document were discussed and addressed.  No physical exam was performed (except for noted visual exam findings with Video Visits).  Please refer to the patient's chart (MyChart message for video visits and phone note for telephone visits) for the patient's consent to telehealth for [facility].  Date:  08/16/2018   ID:  [name], [name] 01-22-1946, MRN [id number]  Patient Location:  Home  Provider location:   [place]  PCP:  Lajean Manes, MD  Cardiologist:  Fransico Him, MD  Electrophysiologist:  None   Chief Complaint:  AI, HTN, DCM, CAD, lipids  History of Present Illness:    [name] [name] is a 73 y.o. female who presents via audio/video conferencing for a telehealth visit today.    [name] [name] is a 73 y.o. female with a hx of PVC's, dilated aortic root,severeAR, HTN, severe LV dysfunction with EF 25% (now normalized), severe mid LAD stenosis s/p 1 vessel CABG (LIMA to LAD) and Bentall procedure with aortic root replacement with pericardial  tissue AVR .  She is here today for followup and is doing well.  She denies any chest pain or pressure, SOB, DOE, PND, orthopnea, LE edema, dizziness, palpitations or syncope. She is compliant with her meds and is tolerating meds with no SE.    The patient does not have symptoms concerning for COVID-19 infection (fever, chills, cough, or new shortness of breath).    Prior CV studies:   The following studies were reviewed today:  2D echo 2017  Past Medical History:  Diagnosis Date  . Adenomatous colon polyp   . Aortic regurgitation    severe by TEE s/p pericardia tissue AVR  . Arthritis   . Cardiomyopathy ([place])    EF initally 25% but now normalized  . Carotid stenosis 08/17/2017   1-39% bilateral by dopplers 2019  . Chronic diastolic CHF (congestive heart failure), NYHA class 2 ([place])   . Coronary artery disease 10/05/2013   Severe mid LAD stenosis s/p 1 vessel CABG with LIMA to LAD  . Diabetes mellitus without complication ([place])    DX 20 YRS AGO.   TYPE 2  . Dilated aortic root ([place])   . GERD (gastroesophageal reflux disease)    Tums prn  . Gout   . Gout   . Heart murmur   . Hypercholesteremia    LDL goal < 70  . Hypertension   . S/P Bio-Bentall aortic root replacement with bioprosthetic valve conduit 10/27/2013   23 mm [facility] Ease bovine pericardial tissue valve and 26 mm Vascutek gelweave Val-Salva aortic graft  .  S/P CABG x 1 10/27/2013   LIMA to LAD  . Thyroiditis 10/2009   lab and u/s-thyroid function normalized in 9/11   Past Surgical History:  Procedure Laterality Date  . ABDOMINAL HYSTERECTOMY    . AORTIC VALVE REPLACEMENT N/A 10/27/2013   Procedure: AORTIC VALVE REPLACEMENT (AVR);  Surgeon: Rexene Alberts, MD;  Location: [place];  Service: Open Heart Surgery;  Laterality: N/A;  . ASCENDING AORTIC ROOT REPLACEMENT N/A 10/27/2013   Procedure: ASCENDING AORTIC ROOT REPLACEMENT;  Surgeon: Rexene Alberts, MD;  Location: [place];  Service: Open Heart Surgery;   Laterality: N/A;  . BUNIONECTOMY     bilateral  . CARDIAC CATHETERIZATION    . CARDIAC VALVE REPLACEMENT     AORTIC VALVE  . CARPAL TUNNEL RELEASE Right   . CATARACT EXTRACTION Bilateral   . COLONOSCOPY    . COLONOSCOPY WITH PROPOFOL N/A 05/08/2015   Procedure: COLONOSCOPY WITH PROPOFOL;  Surgeon: Garlan Fair, MD;  [facility];  Service: Endoscopy;  Laterality: N/A;  . CORONARY ARTERY BYPASS GRAFT N/A 10/27/2013   Procedure: CORONARY ARTERY BYPASS GRAFTING (CABG) x 1 - LIMA to LAD;  Surgeon: Rexene Alberts, MD;  Location: [place];  Service: Open Heart Surgery;  Laterality: N/A;  . EYE SURGERY     BILATERAL CATARACTS  . INTRAOPERATIVE TRANSESOPHAGEAL ECHOCARDIOGRAM N/A 10/27/2013   Procedure: INTRAOPERATIVE TRANSESOPHAGEAL ECHOCARDIOGRAM;  Surgeon: Rexene Alberts, MD;  Location: [place];  Service: Open Heart Surgery;  Laterality: N/A;  . JOINT REPLACEMENT     left total knee  . KNEE ARTHROPLASTY Right 01/26/2017   Procedure: RIGHT TOTAL KNEE ARTHROPLASTY WITH COMPUTER NAVIGATION;  Surgeon: Rod Can, MD;  Location: [place];  Service: Orthopedics;  Laterality: Right;  Needs RNFA  . KNEE ARTHROPLASTY Left 05/28/2017   Procedure: LEFT TOTAL KNEE ARTHROPLASTY WITH COMPUTER NAVIGATION;  Surgeon: Rod Can, MD;  [facility];  Service: Orthopedics;  Laterality: Left;  Needs RNFA  . LEFT AND RIGHT HEART CATHETERIZATION WITH CORONARY ANGIOGRAM N/A 10/05/2013   Procedure: LEFT AND RIGHT HEART CATHETERIZATION WITH CORONARY ANGIOGRAM;  Surgeon: Blane Ohara, MD;  [facility] C[facility] LAB;  Service: Cardiovascular;  Laterality: N/A;  . TEE WITHOUT CARDIOVERSION N/A 10/04/2013   Procedure: TRANSESOPHAGEAL ECHOCARDIOGRAM (TEE);  Surgeon: Sueanne Margarita, MD;  Location: [facility] ENDOSCOPY;  Service: Cardiovascular;  Laterality: N/A;  . tubular adenomatous polyp colonoscopy  12/31/2009     Current Meds  Medication Sig  . aspirin 81 MG chewable tablet Chew 1 tablet (81 mg total) by mouth  2 (two) times daily.  Marland Kitchen atorvastatin (LIPITOR) 10 MG tablet TAKE 1 TABLET BY MOUTH EVERY DAY AT 6PM  . Calcium Carb-Cholecalciferol (CALCIUM 600-D PO) Take 1 tablet by mouth daily.  . carvedilol (COREG) 6.25 MG tablet Take 1 tablet (6.25 mg total) by mouth 2 (two) times daily.  . clindamycin (CLEOCIN) 150 MG capsule Take 4 capsules by mouth as needed. (Take one (1) hour prior to dental procedures)  . colchicine (COLCRYS) 0.6 MG tablet Take 0.6 mg by mouth daily.  Marland Kitchen docusate sodium (COLACE) 100 MG capsule Take 1 capsule (100 mg total) by mouth 2 (two) times daily. (Patient taking differently: Take 100 mg by mouth as needed for mild constipation. )  . fluticasone (FLONASE) 50 MCG/ACT nasal spray Place 1 spray into both nostrils 2 (two) times daily.  Marland Kitchen GLUCOSAMINE-CHONDROITIN PO Take 1 tablet by mouth daily. Glucosamine 1200 mg, chondroitin 1500 mg  . losartan (COZAAR) 25 MG tablet  TAKE 1 TABLET BY MOUTH EVERY DAY  . metFORMIN (GLUCOPHAGE-XR) 500 MG 24 hr tablet Take 1,000 mg by mouth at bedtime.   . Multiple Vitamin (MULTIVITAMIN WITH MINERALS) TABS tablet Take 1 tablet by mouth daily. Centrum Silver  . naproxen sodium (ALEVE) 220 MG tablet Take 440 mg by mouth daily as needed (headaches).  . ONE TOUCH ULTRA TEST test strip   . pramipexole (MIRAPEX) 0.125 MG tablet Take 0.125 mg by mouth at bedtime.  . ranitidine (ZANTAC) 150 MG tablet Take 150 mg by mouth daily as needed for heartburn.     Allergies:   Atenolol; Lisinopril; Penicillins; and Sulfa antibiotics   Social History   Tobacco Use  . Smoking status: Never Smoker  . Smokeless tobacco: Never Used  Substance Use Topics  . Alcohol use: No  . Drug use: No     Family Hx: The patient's family history includes Arthritis/Rheumatoid in her mother; Cancer in her brother; Diabetes in her mother; Heart attack in her father; Hypertension in her mother; Stroke in her sister.  ROS:   Please see the history of present illness.     All other  systems reviewed and are negative.   Labs/Other Tests and Data Reviewed:    Recent Labs: No results found for requested labs within last 8760 hours.   Recent Lipid Panel No results found for: CHOL, TRIG, HDL, CHOLHDL, LDLCALC, LDLDIRECT  Wt Readings from Last 3 Encounters:  08/16/18 172 lb (78 kg)  08/17/17 165 lb 6.4 oz (75 kg)  05/28/17 166 lb (75.3 kg)     Objective:    Vital Signs:  BP 122/82   Pulse 70   Ht 5\' 1"  (1.549 m)   Wt 172 lb (78 kg)   BMI 32.50 kg/m    CONSTITUTIONAL:  Well nourished, well developed female in no acute distress.  EYES: anicteric MOUTH: oral mucosa is pink RESPIRATORY: Normal respiratory effort, symmetric expansion CARDIOVASCULAR: No peripheral edema SKIN: No rash, lesions or ulcers MUSCULOSKELETAL: no digital cyanosis NEURO: Cranial Nerves II-XII grossly intact, moves all extremities PSYCH: Intact judgement and insight.  A&O x 3, Mood/affect appropriate   ASSESSMENT & PLAN:    1.  ASCAD -  severe mid LAD stenosis s/p 1 vessel CABG (LIMA to LAD) at the time of her Bentall procedure in 2015.  She has not had any anginal symptoms.  She will continue on aspirin 81 mg daily, beta-blocker and statin.  2.  Hypertension -her blood pressure is well controlled today on exam.  She will continue on carvedilol 6.25 mg twice daily and losartan 25 mg daily.  Her creatinine is stable at 0.79 by lab work done last week by her PCP.  3.  Severe AI - she is status post Bentall procedure with aortic root replacement in 2015.  Her last 2D echo in 2017 showed stable bioprosthetic AVR.  4.  Chronic diastolic CHF -she has not had any problems with shortness of breath or lower extremity edema. Her weight is very stable compared to last year.  She does not have any evidence of volume overload per her description.  She has not required any diuretics.  5.  Hyperlipidemia -her LDL goal is less than 70.  Her lipids were checked last week by her PCP and her LDL was 32.   She will continue on atorvastatin 10 mg daily.   6.  Bilateral carotid artery stenosis - her Dopplers were last done in 2017 showing 1 to 39% bilateral stenosis.  She will continue on aspirin and statin.  I will repeat Dopplers to make sure this is stable.  7.  COVID-19 Education:The signs and symptoms of COVID-19 were discussed with the patient and how to seek care for testing (follow up with PCP or arrange E-visit).  The importance of social distancing was discussed today.  Patient Risk:   After full review of this patient's clinical status, I feel that they are at least moderate risk at this time.  Time:   Today, I have spent 20 minutes directly with the patient on video discussing medical problems including CAD, HTN, AI, CHF, lipids.  We also reviewed the symptoms of COVID 19 and the ways to protect against contracting the virus with telehealth technology.  I spent an additional 5 minutes reviewing patient's chart including 2D echo 2017, labs.  Medication Adjustments/Labs and Tests Ordered: Current medicines are reviewed at length with the patient today.  Concerns regarding medicines are outlined above.  Tests Ordered: No orders of the defined types were placed in this encounter.  Medication Changes: No orders of the defined types were placed in this encounter.   Disposition:  Follow up in 1 year(s)  Signed, Fransico Him, MD  08/16/2018 10:40 AM    [facility]

## 2018-08-16 NOTE — Patient Instructions (Addendum)
Medication Instructions:   If you need a refill on your cardiac medications before your next appointment, please call your pharmacy.   Lab work:  If you have labs (blood work) drawn today and your tests are completely normal, you will receive your results only by: Marland Kitchen MyChart Message (if you have MyChart) OR . A paper copy in the mail If you have any lab test that is abnormal or we need to change your treatment, we will call you to review the results.  Testing/Procedures: Your physician has requested that you have a carotid duplex. This test is an ultrasound of the carotid arteries in your neck. It looks at blood flow through these arteries that supply the brain with blood. Allow one hour for this exam. There are no restrictions or special instructions.  Follow-Up: At [facility], you and your health needs are our priority.  As part of our continuing mission to provide you with exceptional heart care, we have created designated Provider Care Teams.  These Care Teams include your primary Cardiologist (physician) and Advanced Practice Providers (APPs -  Physician Assistants and Nurse Practitioners) who all work together to provide you with the care you need, when you need it. You will need a follow up appointment in 12 months.  Please call our office 2 months in advance to schedule this appointment.  You may see Fransico Him, MD or one of the following Advanced Practice Providers on your designated Care Team:   [place], PA-C Melina Copa, PA-C . Ermalinda Barrios, PA-C   Someone will call you with an appointment for your carotid ultrasound.

## 2018-08-16 NOTE — Addendum Note (Signed)
Addended by: Aris Georgia, Kenslei Hearty L on: 08/16/2018 11:05 AM   Modules accepted: Orders

## 2018-08-17 ENCOUNTER — Ambulatory Visit
Admission: RE | Admit: 2018-08-17 | Discharge: 2018-08-17 | Disposition: A | Discharge status: Home / Self Care | Payer: Medicare Other | Source: Ambulatory Visit | Attending: Geriatric Medicine | Admitting: Geriatric Medicine

## 2018-08-17 ENCOUNTER — Other Ambulatory Visit: Payer: Self-pay

## 2018-08-17 DIAGNOSIS — N649 Disorder of breast, unspecified: Secondary | ICD-10-CM

## 2018-08-23 ENCOUNTER — Ambulatory Visit: Payer: Medicare Other | Admitting: Cardiology

## 2018-08-23 ENCOUNTER — Other Ambulatory Visit: Payer: Self-pay

## 2018-08-23 ENCOUNTER — Ambulatory Visit (HOSPITAL_COMMUNITY)
Admission: RE | Admit: 2018-08-23 | Discharge: 2018-08-23 | Disposition: A | Discharge status: Home / Self Care | Payer: Medicare Other | Source: Ambulatory Visit | Attending: Cardiology | Admitting: Cardiology

## 2018-08-23 DIAGNOSIS — I6523 Occlusion and stenosis of bilateral carotid arteries: Secondary | ICD-10-CM | POA: Diagnosis present

## 2018-08-24 ENCOUNTER — Other Ambulatory Visit: Payer: Self-pay | Admitting: Cardiology

## 2018-08-26 ENCOUNTER — Ambulatory Visit: Payer: Medicare Other | Admitting: Cardiology

## 2018-08-27 ENCOUNTER — Telehealth: Payer: Medicare Other | Admitting: Cardiology

## 2019-01-14 ENCOUNTER — Other Ambulatory Visit: Payer: Self-pay | Admitting: Cardiology

## 2019-01-17 ENCOUNTER — Other Ambulatory Visit: Payer: Self-pay | Admitting: Cardiology

## 2019-06-18 ENCOUNTER — Other Ambulatory Visit: Payer: Self-pay | Admitting: Cardiology

## 2019-07-07 ENCOUNTER — Other Ambulatory Visit: Payer: Self-pay

## 2019-07-07 ENCOUNTER — Other Ambulatory Visit: Payer: Self-pay | Admitting: Cardiology

## 2019-07-07 MED ORDER — CARVEDILOL 6.25 MG PO TABS: 6.2500 mg | ORAL_TABLET | Freq: Two times a day (BID) | ORAL | 0 refills | Status: DC

## 2019-07-18 ENCOUNTER — Other Ambulatory Visit: Payer: Self-pay | Admitting: Cardiology

## 2019-08-12 ENCOUNTER — Other Ambulatory Visit: Payer: Self-pay | Admitting: Geriatric Medicine

## 2019-08-12 DIAGNOSIS — Z1231 Encounter for screening mammogram for malignant neoplasm of breast: Secondary | ICD-10-CM

## 2019-08-25 ENCOUNTER — Other Ambulatory Visit: Payer: Self-pay

## 2019-08-25 ENCOUNTER — Encounter: Payer: Self-pay | Admitting: Cardiology

## 2019-08-25 ENCOUNTER — Ambulatory Visit: Payer: Medicare PPO | Admitting: Cardiology

## 2019-08-25 VITALS — BP 120/72 | HR 74 | Ht 61.0 in | Wt 169.0 lb

## 2019-08-25 DIAGNOSIS — I2510 Atherosclerotic heart disease of native coronary artery without angina pectoris: Secondary | ICD-10-CM | POA: Diagnosis not present

## 2019-08-25 DIAGNOSIS — I351 Nonrheumatic aortic (valve) insufficiency: Secondary | ICD-10-CM | POA: Diagnosis not present

## 2019-08-25 DIAGNOSIS — I6523 Occlusion and stenosis of bilateral carotid arteries: Secondary | ICD-10-CM

## 2019-08-25 DIAGNOSIS — I5032 Chronic diastolic (congestive) heart failure: Secondary | ICD-10-CM | POA: Diagnosis not present

## 2019-08-25 DIAGNOSIS — I10 Essential (primary) hypertension: Secondary | ICD-10-CM | POA: Diagnosis not present

## 2019-08-25 DIAGNOSIS — E785 Hyperlipidemia, unspecified: Secondary | ICD-10-CM

## 2019-08-25 NOTE — Addendum Note (Signed)
Addended by: Antonieta Iba on: 08/25/2019 03:43 PM   Modules accepted: Orders

## 2019-08-25 NOTE — Patient Instructions (Signed)
Medication Instructions:  Your physician recommends that you continue on your current medications as directed. Please refer to the Current Medication list given to you today.  *If you need a refill on your cardiac medications before your next appointment, please call your pharmacy*  Testing/Procedures: Your physician has requested that you have an echocardiogram. Echocardiography is a painless test that uses sound waves to create images of your heart. It provides your doctor with information about the size and shape of your heart and how well your heart's chambers and valves are working. This procedure takes approximately one hour. There are no restrictions for this procedure.  Follow-Up: At [facility], you and your health needs are our priority.  As part of our continuing mission to provide you with exceptional heart care, we have created designated Provider Care Teams.  These Care Teams include your primary Cardiologist (physician) and Advanced Practice Providers (APPs -  Physician Assistants and Nurse Practitioners) who all work together to provide you with the care you need, when you need it.  We recommend signing up for the patient portal called "MyChart".  Sign up information is provided on this After Visit Summary.  MyChart is used to connect with patients for Virtual Visits (Telemedicine).  Patients are able to view lab/test results, encounter notes, upcoming appointments, etc.  Non-urgent messages can be sent to your provider as well.   To learn more about what you can do with MyChart, go to [email or web address].    Your next appointment:   1 year(s)  The format for your next appointment:   In Person  Provider:   You may see Traci Turner, MD or one of the following Advanced Practice Providers on your designated Care Team:    Dayna Dunn, PA-C  Michele Lenze, PA-C 

## 2019-08-25 NOTE — Progress Notes (Signed)
9 Date:  08/25/2019   ID:  [name] [name], DOB 11-21-1945, MRN [id number]   PCP:  Lajean Manes, MD  Cardiologist:  Fransico Him, MD  Electrophysiologist:  None   Chief Complaint:  AI, HTN, DCM, CAD, lipids  History of Present Illness:     [name] [name] is a 74 y.o. female with a hx of PVC's, dilated aortic root,severeAR, HTN, severe LV dysfunction with EF 25% (now normalized), severe mid LAD stenosis s/p 1 vessel CABG (LIMA to LAD) and Bentall procedure with aortic root replacement with pericardial tissue AVR .  She is here today for followup and is doing well.  She denies any chest pain or pressure, SOB, DOE, PND, orthopnea, LE edema, dizziness, palpitations or syncope. She is compliant with her meds and is tolerating meds with no SE.     Prior CV studies:   The following studies were reviewed today:  none  Past Medical History:  Diagnosis Date  . Adenomatous colon polyp   . Aortic regurgitation    severe by TEE s/p pericardia tissue AVR  . Arthritis   . Cardiomyopathy ([place])    EF initally 25% but now normalized  . Carotid stenosis 08/17/2017   1-39% bilateral by dopplers 2019  . Chronic diastolic CHF (congestive heart failure), NYHA class 2 ([place])   . Coronary artery disease 10/05/2013   Severe mid LAD stenosis s/p 1 vessel CABG with LIMA to LAD  . Diabetes mellitus without complication ([place])    DX 20 YRS AGO.   TYPE 2  . Dilated aortic root ([place])   . GERD (gastroesophageal reflux disease)    Tums prn  . Gout   . Gout   . Heart murmur   . Hypercholesteremia    LDL goal < 70  . Hypertension   . S/P Bio-Bentall aortic root replacement with bioprosthetic valve conduit 10/27/2013   23 mm [facility] Ease bovine pericardial tissue valve and 26 mm Vascutek gelweave Val-Salva aortic graft  . S/P CABG x 1 10/27/2013   LIMA to LAD  . Thyroiditis 10/2009   lab and u/s-thyroid function normalized in 9/11   Past Surgical History:  Procedure Laterality Date  . ABDOMINAL  HYSTERECTOMY    . AORTIC VALVE REPLACEMENT N/A 10/27/2013   Procedure: AORTIC VALVE REPLACEMENT (AVR);  Surgeon: Rexene Alberts, MD;  Location: [place];  Service: Open Heart Surgery;  Laterality: N/A;  . ASCENDING AORTIC ROOT REPLACEMENT N/A 10/27/2013   Procedure: ASCENDING AORTIC ROOT REPLACEMENT;  Surgeon: Rexene Alberts, MD;  Location: [place];  Service: Open Heart Surgery;  Laterality: N/A;  . BUNIONECTOMY     bilateral  . CARDIAC CATHETERIZATION    . CARDIAC VALVE REPLACEMENT     AORTIC VALVE  . CARPAL TUNNEL RELEASE Right   . CATARACT EXTRACTION Bilateral   . COLONOSCOPY    . COLONOSCOPY WITH PROPOFOL N/A 05/08/2015   Procedure: COLONOSCOPY WITH PROPOFOL;  Surgeon: Garlan Fair, MD;  [facility];  Service: Endoscopy;  Laterality: N/A;  . CORONARY ARTERY BYPASS GRAFT N/A 10/27/2013   Procedure: CORONARY ARTERY BYPASS GRAFTING (CABG) x 1 - LIMA to LAD;  Surgeon: Rexene Alberts, MD;  Location: [place];  Service: Open Heart Surgery;  Laterality: N/A;  . EYE SURGERY     BILATERAL CATARACTS  . INTRAOPERATIVE TRANSESOPHAGEAL ECHOCARDIOGRAM N/A 10/27/2013   Procedure: INTRAOPERATIVE TRANSESOPHAGEAL ECHOCARDIOGRAM;  Surgeon: Rexene Alberts, MD;  Location: [place];  Service: Open Heart Surgery;  Laterality: N/A;  .  JOINT REPLACEMENT     left total knee  . KNEE ARTHROPLASTY Right 01/26/2017   Procedure: RIGHT TOTAL KNEE ARTHROPLASTY WITH COMPUTER NAVIGATION;  Surgeon: Rod Can, MD;  Location: [place];  Service: Orthopedics;  Laterality: Right;  Needs RNFA  . KNEE ARTHROPLASTY Left 05/28/2017   Procedure: LEFT TOTAL KNEE ARTHROPLASTY WITH COMPUTER NAVIGATION;  Surgeon: Rod Can, MD;  [facility];  Service: Orthopedics;  Laterality: Left;  Needs RNFA  . LEFT AND RIGHT HEART CATHETERIZATION WITH CORONARY ANGIOGRAM N/A 10/05/2013   Procedure: LEFT AND RIGHT HEART CATHETERIZATION WITH CORONARY ANGIOGRAM;  Surgeon: Blane Ohara, MD;  Location[facility] CATH LAB;  Service:  Cardiovascular;  Laterality: N/A;  . TEE WITHOUT CARDIOVERSION N/A 10/04/2013   Procedure: TRANSESOPHAGEAL ECHOCARDIOGRAM (TEE);  Surgeon: Sueanne Margarita, MD;  Location: [facility] ENDOSCOPY;  Service: Cardiovascular;  Laterality: N/A;  . tubular adenomatous polyp colonoscopy  12/31/2009     Current Meds  Medication Sig  . aspirin 81 MG chewable tablet Chew 1 tablet (81 mg total) by mouth 2 (two) times daily.  Marland Kitchen atorvastatin (LIPITOR) 10 MG tablet TAKE 1 TABLET BY MOUTH EVERY DAY AT 6PM. Please make yearly appt with Dr. Radford Pax for May before anymore refills. 1st attempt  . Calcium Carb-Cholecalciferol (CALCIUM 600-D PO) Take 1 tablet by mouth daily.  . carvedilol (COREG) 6.25 MG tablet Take 1 tablet (6.25 mg total) by mouth 2 (two) times daily. Please make yearly appt with Dr. Radford Pax for May before anymore refills. 1st attempt  . clindamycin (CLEOCIN) 150 MG capsule Take 4 capsules by mouth as needed. (Take one (1) hour prior to dental procedures)  . colchicine (COLCRYS) 0.6 MG tablet Take 0.6 mg by mouth daily.  Marland Kitchen docusate sodium (COLACE) 100 MG capsule Take 1 capsule (100 mg total) by mouth 2 (two) times daily.  . fluticasone (FLONASE) 50 MCG/ACT nasal spray Place 1 spray into both nostrils 2 (two) times daily.  Marland Kitchen GLUCOSAMINE-CHONDROITIN PO Take 1 tablet by mouth daily. Glucosamine 1200 mg, chondroitin 1500 mg  . losartan (COZAAR) 25 MG tablet Take 1 tablet (25 mg total) by mouth daily. Please make yearly appt with Dr. Radford Pax for May before anymore refills. 1st attempt  . metFORMIN (GLUCOPHAGE-XR) 500 MG 24 hr tablet Take 500 mg by mouth in the morning and at bedtime.   . Multiple Vitamin (MULTIVITAMIN WITH MINERALS) TABS tablet Take 1 tablet by mouth daily. Centrum Silver  . naproxen sodium (ALEVE) 220 MG tablet Take 440 mg by mouth daily as needed (headaches).  . ONE TOUCH ULTRA TEST test strip   . pramipexole (MIRAPEX) 0.125 MG tablet Take 0.125 mg by mouth at bedtime.  . ranitidine (ZANTAC) 150  MG tablet Take 150 mg by mouth daily as needed for heartburn.     Allergies:   Atenolol, Lisinopril, Penicillins, and Sulfa antibiotics   Social History   Tobacco Use  . Smoking status: Never Smoker  . Smokeless tobacco: Never Used  Substance Use Topics  . Alcohol use: No  . Drug use: No     Family Hx: The patient's family history includes Arthritis/Rheumatoid in her mother; Cancer in her brother; Diabetes in her mother; Heart attack in her father; Hypertension in her mother; Stroke in her sister.  ROS:   Please see the history of present illness.     All other systems reviewed and are negative.   Labs/Other Tests and Data Reviewed:    Recent Labs: No results found for requested labs within last 8760  hours.   Recent Lipid Panel No results found for: CHOL, TRIG, HDL, CHOLHDL, LDLCALC, LDLDIRECT  Wt Readings from Last 3 Encounters:  08/25/19 169 lb (76.7 kg)  08/16/18 172 lb (78 kg)  08/17/17 165 lb 6.4 oz (75 kg)     Objective:    Vital Signs:  BP 120/72   Pulse 74   Ht 5\' 1"  (1.549 m)   Wt 169 lb (76.7 kg)   SpO2 98%   BMI 31.93 kg/m    GEN: Well nourished, well developed in no acute distress HEENT: Normal NECK: No JVD; No carotid bruits LYMPHATICS: No lymphadenopathy CARDIAC:RRR, no murmurs, rubs, gallops RESPIRATORY:  Clear to auscultation without rales, wheezing or rhonchi  ABDOMEN: Soft, non-tender, non-distended MUSCULOSKELETAL:  No edema; No deformity  SKIN: Warm and dry NEUROLOGIC:  Alert and oriented x 3 PSYCHIATRIC:  Normal affect    ASSESSMENT & PLAN:    1.  ASCAD  - remote cath showed severe mid LAD stenosis s/p 1 vessel CABG (LIMA to LAD) at the time of her Bentall procedure in 2015.   -she denies any anginal sx -continue ASA, BB and statin  2.  Hypertension  -Bp well controlled on exam today -continue Losartan 25mg  daily and Carvedilol 65m25mg  BID -creatinine was 0.72 in April 2021  3.  Severe AI  - she is status post Bentall  procedure with aortic root replacement in 2015.   -Her last 2D echo in 2017 showed stable bioprosthetic AVR. -repeat 2D echo  as it has been 5 years since surgery  4.  Chronic diastolic CHF  -she appears euvolemic on exam with no SOB or LE edema and weight is stable -she has not needed any diuretics  5.  Hyperlipidemia  -her LDL goal is less than 70.  -LDL was 22 last fall -continue Atorvastatin 10mg  daily   6.  Bilateral carotid artery stenosis  - her Dopplers were last done in 08/2018 showing 1 to 39% bilateral stenosis.   -continue ASA and statin   Medication Adjustments/Labs and Tests Ordered: Current medicines are reviewed at length with the patient today.  Concerns regarding medicines are outlined above.  Tests Ordered: Orders Placed This Encounter  Procedures  . EKG 12-Lead   Medication Changes: No orders of the defined types were placed in this encounter.   Disposition:  Follow up in 1 year(s)  Signed, Fransico Him, MD  08/25/2019 3:34 PM    [place]

## 2019-08-29 ENCOUNTER — Ambulatory Visit
Admission: RE | Admit: 2019-08-29 | Discharge: 2019-08-29 | Disposition: A | Discharge status: Home / Self Care | Payer: Medicare Other | Source: Ambulatory Visit | Attending: Geriatric Medicine | Admitting: Geriatric Medicine

## 2019-08-29 ENCOUNTER — Other Ambulatory Visit: Payer: Self-pay

## 2019-08-29 DIAGNOSIS — Z1231 Encounter for screening mammogram for malignant neoplasm of breast: Secondary | ICD-10-CM

## 2019-09-15 ENCOUNTER — Other Ambulatory Visit: Payer: Self-pay

## 2019-09-15 ENCOUNTER — Ambulatory Visit (HOSPITAL_COMMUNITY): Payer: Medicare PPO | Attending: Cardiovascular Disease

## 2019-09-15 DIAGNOSIS — I351 Nonrheumatic aortic (valve) insufficiency: Secondary | ICD-10-CM | POA: Diagnosis not present

## 2019-09-20 ENCOUNTER — Telehealth: Payer: Self-pay | Admitting: Cardiology

## 2019-09-20 NOTE — Telephone Encounter (Signed)
Patient states nurse had left her message yesterday on her home phone about her Echo.  If she can call her back on her cell as she is out of town.

## 2019-09-20 NOTE — Telephone Encounter (Signed)
Called patient with echo results. Per Dr. Radford Pax, Low normal heart function with mildly enlarged atrial, increased stiffness of heart muscle. Stable AVR - no change from prior echo. Patient verbalized understanding.

## 2019-11-09 ENCOUNTER — Other Ambulatory Visit: Payer: Self-pay | Admitting: Cardiology

## 2019-12-12 ENCOUNTER — Other Ambulatory Visit: Payer: Self-pay | Admitting: Cardiology

## 2019-12-15 DIAGNOSIS — L820 Inflamed seborrheic keratosis: Secondary | ICD-10-CM | POA: Diagnosis not present

## 2019-12-15 DIAGNOSIS — L309 Dermatitis, unspecified: Secondary | ICD-10-CM | POA: Diagnosis not present

## 2019-12-15 DIAGNOSIS — Z85828 Personal history of other malignant neoplasm of skin: Secondary | ICD-10-CM | POA: Diagnosis not present

## 2020-01-03 DIAGNOSIS — E1169 Type 2 diabetes mellitus with other specified complication: Secondary | ICD-10-CM | POA: Diagnosis not present

## 2020-01-03 DIAGNOSIS — Z7984 Long term (current) use of oral hypoglycemic drugs: Secondary | ICD-10-CM | POA: Diagnosis not present

## 2020-01-03 DIAGNOSIS — R490 Dysphonia: Secondary | ICD-10-CM | POA: Diagnosis not present

## 2020-01-03 DIAGNOSIS — Z23 Encounter for immunization: Secondary | ICD-10-CM | POA: Diagnosis not present

## 2020-02-06 DIAGNOSIS — K219 Gastro-esophageal reflux disease without esophagitis: Secondary | ICD-10-CM | POA: Diagnosis not present

## 2020-02-06 DIAGNOSIS — R490 Dysphonia: Secondary | ICD-10-CM | POA: Diagnosis not present

## 2020-02-08 DIAGNOSIS — Z03818 Encounter for observation for suspected exposure to other biological agents ruled out: Secondary | ICD-10-CM | POA: Diagnosis not present

## 2020-02-08 DIAGNOSIS — R059 Cough, unspecified: Secondary | ICD-10-CM | POA: Diagnosis not present

## 2020-02-08 DIAGNOSIS — R509 Fever, unspecified: Secondary | ICD-10-CM | POA: Diagnosis not present

## 2020-02-14 DIAGNOSIS — Z961 Presence of intraocular lens: Secondary | ICD-10-CM | POA: Diagnosis not present

## 2020-02-14 DIAGNOSIS — E119 Type 2 diabetes mellitus without complications: Secondary | ICD-10-CM | POA: Diagnosis not present

## 2020-02-14 DIAGNOSIS — H04123 Dry eye syndrome of bilateral lacrimal glands: Secondary | ICD-10-CM | POA: Diagnosis not present

## 2020-02-14 DIAGNOSIS — D3132 Benign neoplasm of left choroid: Secondary | ICD-10-CM | POA: Diagnosis not present

## 2020-02-15 DIAGNOSIS — E1169 Type 2 diabetes mellitus with other specified complication: Secondary | ICD-10-CM | POA: Diagnosis not present

## 2020-02-15 DIAGNOSIS — D7589 Other specified diseases of blood and blood-forming organs: Secondary | ICD-10-CM | POA: Diagnosis not present

## 2020-02-15 DIAGNOSIS — I10 Essential (primary) hypertension: Secondary | ICD-10-CM | POA: Diagnosis not present

## 2020-02-15 DIAGNOSIS — I77810 Thoracic aortic ectasia: Secondary | ICD-10-CM | POA: Diagnosis not present

## 2020-02-15 DIAGNOSIS — Z79899 Other long term (current) drug therapy: Secondary | ICD-10-CM | POA: Diagnosis not present

## 2020-02-15 DIAGNOSIS — Z1389 Encounter for screening for other disorder: Secondary | ICD-10-CM | POA: Diagnosis not present

## 2020-02-15 DIAGNOSIS — G2581 Restless legs syndrome: Secondary | ICD-10-CM | POA: Diagnosis not present

## 2020-02-15 DIAGNOSIS — Z0000 Encounter for general adult medical examination without abnormal findings: Secondary | ICD-10-CM | POA: Diagnosis not present

## 2020-02-15 DIAGNOSIS — Z7984 Long term (current) use of oral hypoglycemic drugs: Secondary | ICD-10-CM | POA: Diagnosis not present

## 2020-02-15 DIAGNOSIS — E7800 Pure hypercholesterolemia, unspecified: Secondary | ICD-10-CM | POA: Diagnosis not present

## 2020-03-06 ENCOUNTER — Other Ambulatory Visit: Payer: Self-pay | Admitting: Cardiology

## 2020-03-07 ENCOUNTER — Other Ambulatory Visit: Payer: Self-pay | Admitting: Cardiology

## 2020-03-19 DIAGNOSIS — K219 Gastro-esophageal reflux disease without esophagitis: Secondary | ICD-10-CM | POA: Diagnosis not present

## 2020-03-19 DIAGNOSIS — R490 Dysphonia: Secondary | ICD-10-CM | POA: Diagnosis not present

## 2020-05-01 DIAGNOSIS — K219 Gastro-esophageal reflux disease without esophagitis: Secondary | ICD-10-CM | POA: Diagnosis not present

## 2020-05-01 DIAGNOSIS — R490 Dysphonia: Secondary | ICD-10-CM | POA: Diagnosis not present

## 2020-05-02 ENCOUNTER — Other Ambulatory Visit: Payer: Self-pay | Admitting: Geriatric Medicine

## 2020-05-02 ENCOUNTER — Ambulatory Visit
Admission: RE | Admit: 2020-05-02 | Discharge: 2020-05-02 | Disposition: A | Discharge status: Home / Self Care | Payer: Medicare PPO | Source: Ambulatory Visit | Attending: Geriatric Medicine | Admitting: Geriatric Medicine

## 2020-05-02 DIAGNOSIS — I10 Essential (primary) hypertension: Secondary | ICD-10-CM | POA: Diagnosis not present

## 2020-05-02 DIAGNOSIS — Z7984 Long term (current) use of oral hypoglycemic drugs: Secondary | ICD-10-CM | POA: Diagnosis not present

## 2020-05-02 DIAGNOSIS — R059 Cough, unspecified: Secondary | ICD-10-CM

## 2020-05-02 DIAGNOSIS — E1169 Type 2 diabetes mellitus with other specified complication: Secondary | ICD-10-CM | POA: Diagnosis not present

## 2020-05-02 DIAGNOSIS — Z79899 Other long term (current) drug therapy: Secondary | ICD-10-CM | POA: Diagnosis not present

## 2020-05-02 DIAGNOSIS — R634 Abnormal weight loss: Secondary | ICD-10-CM | POA: Diagnosis not present

## 2020-05-02 DIAGNOSIS — D7589 Other specified diseases of blood and blood-forming organs: Secondary | ICD-10-CM | POA: Diagnosis not present

## 2020-05-17 DIAGNOSIS — Z7984 Long term (current) use of oral hypoglycemic drugs: Secondary | ICD-10-CM | POA: Diagnosis not present

## 2020-05-17 DIAGNOSIS — E1169 Type 2 diabetes mellitus with other specified complication: Secondary | ICD-10-CM | POA: Diagnosis not present

## 2020-05-17 DIAGNOSIS — I10 Essential (primary) hypertension: Secondary | ICD-10-CM | POA: Diagnosis not present

## 2020-06-22 DIAGNOSIS — H10411 Chronic giant papillary conjunctivitis, right eye: Secondary | ICD-10-CM | POA: Diagnosis not present

## 2020-06-22 DIAGNOSIS — H02831 Dermatochalasis of right upper eyelid: Secondary | ICD-10-CM | POA: Diagnosis not present

## 2020-06-22 DIAGNOSIS — Z961 Presence of intraocular lens: Secondary | ICD-10-CM | POA: Diagnosis not present

## 2020-07-09 DIAGNOSIS — R490 Dysphonia: Secondary | ICD-10-CM | POA: Diagnosis not present

## 2020-07-12 DIAGNOSIS — H57813 Brow ptosis, bilateral: Secondary | ICD-10-CM | POA: Diagnosis not present

## 2020-07-12 DIAGNOSIS — H02423 Myogenic ptosis of bilateral eyelids: Secondary | ICD-10-CM | POA: Diagnosis not present

## 2020-07-12 DIAGNOSIS — H02834 Dermatochalasis of left upper eyelid: Secondary | ICD-10-CM | POA: Diagnosis not present

## 2020-07-12 DIAGNOSIS — H53483 Generalized contraction of visual field, bilateral: Secondary | ICD-10-CM | POA: Diagnosis not present

## 2020-07-12 DIAGNOSIS — H0279 Other degenerative disorders of eyelid and periocular area: Secondary | ICD-10-CM | POA: Diagnosis not present

## 2020-07-12 DIAGNOSIS — H02831 Dermatochalasis of right upper eyelid: Secondary | ICD-10-CM | POA: Diagnosis not present

## 2020-07-12 DIAGNOSIS — H02413 Mechanical ptosis of bilateral eyelids: Secondary | ICD-10-CM | POA: Diagnosis not present

## 2020-07-25 ENCOUNTER — Other Ambulatory Visit: Payer: Self-pay | Admitting: Geriatric Medicine

## 2020-07-25 DIAGNOSIS — Z1231 Encounter for screening mammogram for malignant neoplasm of breast: Secondary | ICD-10-CM

## 2020-08-14 DIAGNOSIS — E1169 Type 2 diabetes mellitus with other specified complication: Secondary | ICD-10-CM | POA: Diagnosis not present

## 2020-08-14 DIAGNOSIS — Z79899 Other long term (current) drug therapy: Secondary | ICD-10-CM | POA: Diagnosis not present

## 2020-08-14 DIAGNOSIS — E1129 Type 2 diabetes mellitus with other diabetic kidney complication: Secondary | ICD-10-CM | POA: Diagnosis not present

## 2020-08-14 DIAGNOSIS — E7800 Pure hypercholesterolemia, unspecified: Secondary | ICD-10-CM | POA: Diagnosis not present

## 2020-08-14 DIAGNOSIS — R809 Proteinuria, unspecified: Secondary | ICD-10-CM | POA: Diagnosis not present

## 2020-08-14 DIAGNOSIS — I77810 Thoracic aortic ectasia: Secondary | ICD-10-CM | POA: Diagnosis not present

## 2020-08-14 DIAGNOSIS — I10 Essential (primary) hypertension: Secondary | ICD-10-CM | POA: Diagnosis not present

## 2020-08-15 DIAGNOSIS — Z79899 Other long term (current) drug therapy: Secondary | ICD-10-CM | POA: Diagnosis not present

## 2020-08-15 DIAGNOSIS — E1169 Type 2 diabetes mellitus with other specified complication: Secondary | ICD-10-CM | POA: Diagnosis not present

## 2020-08-20 ENCOUNTER — Ambulatory Visit: Payer: Medicare PPO | Admitting: Cardiology

## 2020-08-20 ENCOUNTER — Encounter: Payer: Self-pay | Admitting: Cardiology

## 2020-08-20 ENCOUNTER — Other Ambulatory Visit: Payer: Self-pay

## 2020-08-20 VITALS — BP 136/74 | HR 71 | Ht 61.0 in | Wt 165.0 lb

## 2020-08-20 DIAGNOSIS — I351 Nonrheumatic aortic (valve) insufficiency: Secondary | ICD-10-CM | POA: Diagnosis not present

## 2020-08-20 DIAGNOSIS — I10 Essential (primary) hypertension: Secondary | ICD-10-CM

## 2020-08-20 DIAGNOSIS — E785 Hyperlipidemia, unspecified: Secondary | ICD-10-CM | POA: Diagnosis not present

## 2020-08-20 DIAGNOSIS — I6523 Occlusion and stenosis of bilateral carotid arteries: Secondary | ICD-10-CM

## 2020-08-20 DIAGNOSIS — I2510 Atherosclerotic heart disease of native coronary artery without angina pectoris: Secondary | ICD-10-CM | POA: Diagnosis not present

## 2020-08-20 DIAGNOSIS — I5032 Chronic diastolic (congestive) heart failure: Secondary | ICD-10-CM

## 2020-08-20 MED ORDER — CARVEDILOL 6.25 MG PO TABS: 6.2500 mg | ORAL_TABLET | Freq: Two times a day (BID) | ORAL | 3 refills | Status: DC

## 2020-08-20 MED ORDER — LOSARTAN POTASSIUM 25 MG PO TABS: ORAL_TABLET | ORAL | 3 refills | Status: DC

## 2020-08-20 MED ORDER — ATORVASTATIN CALCIUM 10 MG PO TABS: 10.0000 mg | ORAL_TABLET | Freq: Every day | ORAL | 3 refills | Status: DC

## 2020-08-20 NOTE — Progress Notes (Signed)
9 Date:  08/20/2020   ID:  [name] [name], DOB 11/20/1945, MRN [id number]   PCP:  Lajean Manes, MD  Cardiologist:  Fransico Him, MD  Electrophysiologist:  None   Chief Complaint:  AI, HTN, DCM, CAD, lipids  History of Present Illness:     [name] [name] is a 75 y.o. female with a hx of PVC's, dilated aortic root,severeAR, HTN, severe LV dysfunction with EF 25% (now normalized), severe mid LAD stenosis s/p 1 vessel CABG (LIMA to LAD) and Bentall procedure with aortic root replacement with pericardial tissue AVR .    She is here today for followup and is doing well.  She denies any chest pain or pressure, SOB, DOE, PND, orthopnea, LE edema, dizziness, palpitations or syncope. She is compliant with his meds and is tolerating meds with no SE.    Prior CV studies:   The following studies were reviewed today:  EKG  2D echo 09/2019 IMPRESSIONS   1. Left ventricular ejection fraction, by estimation, is 50 to 55%. The  left ventricle has low normal function. The left ventricle has no regional  wall motion abnormalities. Left ventricular diastolic parameters are  consistent with Grade II diastolic  dysfunction (pseudonormalization).  2. Right ventricular systolic function is normal. The right ventricular  size is normal. There is normal pulmonary artery systolic pressure.  3. Left atrial size was mildly dilated.  4. Right atrial size was mildly dilated.  5. The mitral valve is normal in structure. Trivial mitral valve  regurgitation. No evidence of mitral stenosis.  6. The aortic valve has been repaired/replaced. Aortic valve  regurgitation is not visualized. No aortic stenosis is present.   Past Medical History:  Diagnosis Date  . Adenomatous colon polyp   . Aortic regurgitation    severe by TEE s/p pericardia tissue AVR  . Arthritis   . Cardiomyopathy ([place])    EF initally 25% but now normalized  . Carotid stenosis 08/17/2017   1-39% bilateral by dopplers 2019  .  Chronic diastolic CHF (congestive heart failure), NYHA class 2 ([place])   . Coronary artery disease 10/05/2013   Severe mid LAD stenosis s/p 1 vessel CABG with LIMA to LAD  . Diabetes mellitus without complication ([place])    DX 20 YRS AGO.   TYPE 2  . Dilated aortic root ([place])   . GERD (gastroesophageal reflux disease)    Tums prn  . Gout   . Hypercholesteremia    LDL goal < 70  . Hypertension   . S/P Bio-Bentall aortic root replacement with bioprosthetic valve conduit 10/27/2013   23 mm [facility] Ease bovine pericardial tissue valve and 26 mm Vascutek gelweave Val-Salva aortic graft  . S/P CABG x 1 10/27/2013   LIMA to LAD  . Thyroiditis 10/2009   lab and u/s-thyroid function normalized in 9/11   Past Surgical History:  Procedure Laterality Date  . ABDOMINAL HYSTERECTOMY    . AORTIC VALVE REPLACEMENT N/A 10/27/2013   Procedure: AORTIC VALVE REPLACEMENT (AVR);  Surgeon: Rexene Alberts, MD;  Location: [place];  Service: Open Heart Surgery;  Laterality: N/A;  . ASCENDING AORTIC ROOT REPLACEMENT N/A 10/27/2013   Procedure: ASCENDING AORTIC ROOT REPLACEMENT;  Surgeon: Rexene Alberts, MD;  Location: [place];  Service: Open Heart Surgery;  Laterality: N/A;  . BUNIONECTOMY     bilateral  . CARDIAC CATHETERIZATION    . CARDIAC VALVE REPLACEMENT     AORTIC VALVE  . CARPAL TUNNEL RELEASE Right   .  CATARACT EXTRACTION Bilateral   . COLONOSCOPY    . COLONOSCOPY WITH PROPOFOL N/A 05/08/2015   Procedure: COLONOSCOPY WITH PROPOFOL;  Surgeon: Garlan Fair, MD;  Location: [facility];  Service: Endoscopy;  Laterality: N/A;  . CORONARY ARTERY BYPASS GRAFT N/A 10/27/2013   Procedure: CORONARY ARTERY BYPASS GRAFTING (CABG) x 1 - LIMA to LAD;  Surgeon: Rexene Alberts, MD;  Location: [place];  Service: Open Heart Surgery;  Laterality: N/A;  . EYE SURGERY     BILATERAL CATARACTS  . INTRAOPERATIVE TRANSESOPHAGEAL ECHOCARDIOGRAM N/A 10/27/2013   Procedure: INTRAOPERATIVE TRANSESOPHAGEAL ECHOCARDIOGRAM;   Surgeon: Rexene Alberts, MD;  Location: [place];  Service: Open Heart Surgery;  Laterality: N/A;  . JOINT REPLACEMENT     left total knee  . KNEE ARTHROPLASTY Right 01/26/2017   Procedure: RIGHT TOTAL KNEE ARTHROPLASTY WITH COMPUTER NAVIGATION;  Surgeon: Rod Can, MD;  Location: [place];  Service: Orthopedics;  Laterality: Right;  Needs RNFA  . KNEE ARTHROPLASTY Left 05/28/2017   Procedure: LEFT TOTAL KNEE ARTHROPLASTY WITH COMPUTER NAVIGATION;  Surgeon: Rod Can, MD;  Location: [facility];  Service: Orthopedics;  Laterality: Left;  Needs RNFA  . LEFT AND RIGHT HEART CATHETERIZATION WITH CORONARY ANGIOGRAM N/A 10/05/2013   Procedure: LEFT AND RIGHT HEART CATHETERIZATION WITH CORONARY ANGIOGRAM;  Surgeon: Blane Ohara, MD;  Location: [facility] C[facility];  Service: Cardiovascular;  Laterality: N/A;  . TEE WITHOUT CARDIOVERSION N/A 10/04/2013   Procedure: TRANSESOPHAGEAL ECHOCARDIOGRAM (TEE);  Surgeon: Sueanne Margarita, MD;  Location: [facility];  Service: Cardiovascular;  Laterality: N/A;  . tubular adenomatous polyp colonoscopy  12/31/2009     Current Meds  Medication Sig  . aspirin 81 MG chewable tablet Chew 1 tablet (81 mg total) by mouth 2 (two) times daily.  Marland Kitchen atorvastatin (LIPITOR) 10 MG tablet TAKE 1 TABLET BY MOUTH EVERY DAY AT 6PM  . Calcium Carb-Cholecalciferol (CALCIUM 600-D PO) Take 1 tablet by mouth daily.  . carvedilol (COREG) 6.25 MG tablet TAKE 1 TABLET (6.25 MG TOTAL) BY MOUTH 2 (TWO) TIMES DAILY WITH A MEAL.  . clindamycin (CLEOCIN) 150 MG capsule Take 4 capsules by mouth as needed. (Take one (1) hour prior to dental procedures)  . colchicine 0.6 MG tablet Take 0.6 mg by mouth daily.  Marland Kitchen docusate sodium (COLACE) 100 MG capsule Take 1 capsule (100 mg total) by mouth 2 (two) times daily.  . fluticasone (FLONASE) 50 MCG/ACT nasal spray Place 1 spray into both nostrils 2 (two) times daily.  Marland Kitchen glimepiride (AMARYL) 2 MG tablet Take 2 mg by mouth daily with breakfast.  .  GLUCOSAMINE-CHONDROITIN PO Take 1 tablet by mouth daily. Glucosamine 1200 mg, chondroitin 1500 mg  . losartan (COZAAR) 25 MG tablet TAKE 1 TABLET BY MOUTH DAILY. PLEASE MAKE YEARLY APPT FOR MAY BEFORE ANYMORE REFILLS  . metFORMIN (GLUCOPHAGE-XR) 500 MG 24 hr tablet Take 500 mg by mouth in the morning and at bedtime.   . Multiple Vitamin (MULTIVITAMIN WITH MINERALS) TABS tablet Take 1 tablet by mouth daily. Centrum Silver  . naproxen sodium (ALEVE) 220 MG tablet Take 440 mg by mouth daily as needed (headaches).  . ONE TOUCH ULTRA TEST test strip   . pramipexole (MIRAPEX) 0.125 MG tablet Take 0.125 mg by mouth at bedtime.  . ranitidine (ZANTAC) 150 MG tablet Take 150 mg by mouth daily as needed for heartburn.     Allergies:   Atenolol, Lisinopril, Penicillins, and Sulfa antibiotics   Social History   Tobacco Use  . Smoking  status: Never Smoker  . Smokeless tobacco: Never Used  Vaping Use  . Vaping Use: Never used  Substance Use Topics  . Alcohol use: No  . Drug use: No     Family Hx: The patient's family history includes Arthritis/Rheumatoid in her mother; Cancer in her brother; Diabetes in her mother; Heart attack in her father; Hypertension in her mother; Stroke in her sister.  ROS:   Please see the history of present illness.     All other systems reviewed and are negative.   Labs/Other Tests and Data Reviewed:    Recent Labs: No results found for requested labs within last 8760 hours.   Recent Lipid Panel No results found for: CHOL, TRIG, HDL, CHOLHDL, LDLCALC, LDLDIRECT  Wt Readings from Last 3 Encounters:  08/20/20 165 lb (74.8 kg)  08/25/19 169 lb (76.7 kg)  08/16/18 172 lb (78 kg)     Objective:    Vital Signs:  BP 136/74   Pulse 71   Ht 5\' 1"  (1.549 m)   Wt 165 lb (74.8 kg)   BMI 31.18 kg/m    GEN: Well nourished, well developed in no acute distress HEENT: Normal NECK: No JVD; left carotid bruit LYMPHATICS: No lymphadenopathy CARDIAC:RRR, no  rubs,  gallops, 1/6 SM at RUSB RESPIRATORY:  Clear to auscultation without rales, wheezing or rhonchi  ABDOMEN: Soft, non-tender, non-distended MUSCULOSKELETAL:  No edema; No deformity  SKIN: Warm and dry NEUROLOGIC:  Alert and oriented x 3 PSYCHIATRIC:  Normal affect  EKG was performed today and showed NSR with anterior infarct, LVY by voltage, LAFB  ASSESSMENT & PLAN:    1.  ASCAD  - remote cath showed severe mid LAD stenosis s/p 1 vessel CABG (LIMA to LAD) at the time of her Bentall procedure in 2015.   -she has not had any anginal symptoms since I was her last -continue ASA, BB and statin  2.  Hypertension  -her BP is well controlled on exam today -continue prescription drug management with Losartan 25mg  daily and carvedilol 6/.25mg  BID >refills submitted for 1 year today  -I have personally reviewed and interpreted the labs done by her PCP on 08/15/20 and showed a stable SCR of 0.77 and K+ 5.2  3.  Severe AI  - she is status post Bentall procedure with aortic root replacement in 2015.   -Her l last 2D echo was done 09/2019 and showed stable AVR with mean AVG 90mmHg  4.  Chronic diastolic CHF  -she does not appear volume overloaded on exam today -she has not needed any diuretics  5.  Hyperlipidemia  -her LDL goal is less than 70.  -LDL was 21 in Nov 2021 -continue prescription drug management with Atorvastatin 10mg  daily>>refilled for 1 year today   6.  Bilateral carotid artery stenosis  - her Dopplers were last done in 08/2018 showing 1 to 39% bilateral stenosis.   -she has a left carotid bruit on exam -repeat dopplers since it has been 2 years -continue ASA and statin  followup with me in 1 year   Medication Adjustments/Labs and Tests Ordered: Current medicines are reviewed at length with the patient today.  Concerns regarding medicines are outlined above.  Tests Ordered: Orders Placed This Encounter  Procedures  . EKG 12-Lead   Medication Changes: No orders of the  defined types were placed in this encounter.   Disposition:  Follow up in 1 year(s)  Signed, Fransico Him, MD  08/20/2020 11:01 AM    [facility]

## 2020-08-20 NOTE — Addendum Note (Signed)
Addended by: Antonieta Iba on: 08/20/2020 11:12 AM   Modules accepted: Orders

## 2020-08-20 NOTE — Patient Instructions (Signed)
Medication Instructions:  Your physician recommends that you continue on your current medications as directed. Please refer to the Current Medication list given to you today.  *If you need a refill on your cardiac medications before your next appointment, please call your pharmacy*   Testing/Procedures: Your physician has requested that you have a carotid duplex. This test is an ultrasound of the carotid arteries in your neck. It looks at blood flow through these arteries that supply the brain with blood. Allow one hour for this exam. There are no restrictions or special instructions.  Follow-Up: At [facility], you and your health needs are our priority.  As part of our continuing mission to provide you with exceptional heart care, we have created designated Provider Care Teams.  These Care Teams include your primary Cardiologist (physician) and Advanced Practice Providers (APPs -  Physician Assistants and Nurse Practitioners) who all work together to provide you with the care you need, when you need it.   Your next appointment:   1 year(s)  The format for your next appointment:   In Person  Provider:   You may see Traci Turner, MD or one of the following Advanced Practice Providers on your designated Care Team:   Dayna Dunn, PA-C Michele Lenze, PA-C   

## 2020-08-27 DIAGNOSIS — R490 Dysphonia: Secondary | ICD-10-CM | POA: Diagnosis not present

## 2020-08-28 ENCOUNTER — Other Ambulatory Visit: Payer: Self-pay

## 2020-08-28 ENCOUNTER — Ambulatory Visit (HOSPITAL_COMMUNITY)
Admission: RE | Admit: 2020-08-28 | Discharge: 2020-08-28 | Disposition: A | Discharge status: Home / Self Care | Payer: Medicare PPO | Source: Ambulatory Visit | Attending: Cardiovascular Disease | Admitting: Cardiovascular Disease

## 2020-08-28 DIAGNOSIS — I6523 Occlusion and stenosis of bilateral carotid arteries: Secondary | ICD-10-CM | POA: Diagnosis not present

## 2020-08-28 DIAGNOSIS — I2510 Atherosclerotic heart disease of native coronary artery without angina pectoris: Secondary | ICD-10-CM | POA: Insufficient documentation

## 2020-08-29 ENCOUNTER — Encounter: Payer: Self-pay | Admitting: Cardiology

## 2020-09-05 DIAGNOSIS — R059 Cough, unspecified: Secondary | ICD-10-CM | POA: Diagnosis not present

## 2020-09-09 DIAGNOSIS — R059 Cough, unspecified: Secondary | ICD-10-CM | POA: Diagnosis not present

## 2020-09-09 DIAGNOSIS — R058 Other specified cough: Secondary | ICD-10-CM | POA: Diagnosis not present

## 2020-09-12 ENCOUNTER — Ambulatory Visit
Admission: RE | Admit: 2020-09-12 | Discharge: 2020-09-12 | Disposition: A | Discharge status: Home / Self Care | Payer: Medicare PPO | Source: Ambulatory Visit | Attending: Geriatric Medicine | Admitting: Geriatric Medicine

## 2020-09-12 ENCOUNTER — Other Ambulatory Visit: Payer: Self-pay

## 2020-09-12 DIAGNOSIS — Z1231 Encounter for screening mammogram for malignant neoplasm of breast: Secondary | ICD-10-CM

## 2020-09-19 DIAGNOSIS — R490 Dysphonia: Secondary | ICD-10-CM | POA: Diagnosis not present

## 2020-10-04 DIAGNOSIS — R490 Dysphonia: Secondary | ICD-10-CM | POA: Diagnosis not present

## 2020-10-09 DIAGNOSIS — I10 Essential (primary) hypertension: Secondary | ICD-10-CM | POA: Diagnosis not present

## 2021-01-04 DIAGNOSIS — I2510 Atherosclerotic heart disease of native coronary artery without angina pectoris: Secondary | ICD-10-CM | POA: Diagnosis not present

## 2021-01-04 DIAGNOSIS — G8929 Other chronic pain: Secondary | ICD-10-CM | POA: Diagnosis not present

## 2021-01-04 DIAGNOSIS — I110 Hypertensive heart disease with heart failure: Secondary | ICD-10-CM | POA: Diagnosis not present

## 2021-01-04 DIAGNOSIS — E669 Obesity, unspecified: Secondary | ICD-10-CM | POA: Diagnosis not present

## 2021-01-04 DIAGNOSIS — J309 Allergic rhinitis, unspecified: Secondary | ICD-10-CM | POA: Diagnosis not present

## 2021-01-04 DIAGNOSIS — G2581 Restless legs syndrome: Secondary | ICD-10-CM | POA: Diagnosis not present

## 2021-01-04 DIAGNOSIS — I509 Heart failure, unspecified: Secondary | ICD-10-CM | POA: Diagnosis not present

## 2021-01-04 DIAGNOSIS — E785 Hyperlipidemia, unspecified: Secondary | ICD-10-CM | POA: Diagnosis not present

## 2021-01-04 DIAGNOSIS — E1165 Type 2 diabetes mellitus with hyperglycemia: Secondary | ICD-10-CM | POA: Diagnosis not present

## 2021-02-19 DIAGNOSIS — E119 Type 2 diabetes mellitus without complications: Secondary | ICD-10-CM | POA: Diagnosis not present

## 2021-02-19 DIAGNOSIS — D3132 Benign neoplasm of left choroid: Secondary | ICD-10-CM | POA: Diagnosis not present

## 2021-02-19 DIAGNOSIS — H04123 Dry eye syndrome of bilateral lacrimal glands: Secondary | ICD-10-CM | POA: Diagnosis not present

## 2021-02-19 DIAGNOSIS — H02423 Myogenic ptosis of bilateral eyelids: Secondary | ICD-10-CM | POA: Diagnosis not present

## 2021-03-19 DIAGNOSIS — Z23 Encounter for immunization: Secondary | ICD-10-CM | POA: Diagnosis not present

## 2021-05-06 DIAGNOSIS — G2581 Restless legs syndrome: Secondary | ICD-10-CM | POA: Diagnosis not present

## 2021-05-06 DIAGNOSIS — I10 Essential (primary) hypertension: Secondary | ICD-10-CM | POA: Diagnosis not present

## 2021-05-06 DIAGNOSIS — E1169 Type 2 diabetes mellitus with other specified complication: Secondary | ICD-10-CM | POA: Diagnosis not present

## 2021-05-06 DIAGNOSIS — Z7984 Long term (current) use of oral hypoglycemic drugs: Secondary | ICD-10-CM | POA: Diagnosis not present

## 2021-05-06 DIAGNOSIS — Z79899 Other long term (current) drug therapy: Secondary | ICD-10-CM | POA: Diagnosis not present

## 2021-05-06 DIAGNOSIS — E7800 Pure hypercholesterolemia, unspecified: Secondary | ICD-10-CM | POA: Diagnosis not present

## 2021-05-06 DIAGNOSIS — Z1331 Encounter for screening for depression: Secondary | ICD-10-CM | POA: Diagnosis not present

## 2021-05-06 DIAGNOSIS — I77810 Thoracic aortic ectasia: Secondary | ICD-10-CM | POA: Diagnosis not present

## 2021-05-06 DIAGNOSIS — E1129 Type 2 diabetes mellitus with other diabetic kidney complication: Secondary | ICD-10-CM | POA: Diagnosis not present

## 2021-05-06 DIAGNOSIS — D7589 Other specified diseases of blood and blood-forming organs: Secondary | ICD-10-CM | POA: Diagnosis not present

## 2021-05-06 DIAGNOSIS — Z0000 Encounter for general adult medical examination without abnormal findings: Secondary | ICD-10-CM | POA: Diagnosis not present

## 2021-08-08 ENCOUNTER — Other Ambulatory Visit: Payer: Self-pay | Admitting: Geriatric Medicine

## 2021-08-08 DIAGNOSIS — Z1231 Encounter for screening mammogram for malignant neoplasm of breast: Secondary | ICD-10-CM

## 2021-08-14 ENCOUNTER — Other Ambulatory Visit: Payer: Self-pay | Admitting: Cardiology

## 2021-09-06 ENCOUNTER — Other Ambulatory Visit: Payer: Self-pay | Admitting: Cardiology

## 2021-09-13 ENCOUNTER — Ambulatory Visit: Payer: Medicare PPO

## 2021-09-16 ENCOUNTER — Ambulatory Visit
Admission: RE | Admit: 2021-09-16 | Discharge: 2021-09-16 | Disposition: A | Discharge status: Home / Self Care | Payer: Medicare PPO | Source: Ambulatory Visit | Attending: Geriatric Medicine | Admitting: Geriatric Medicine

## 2021-09-16 DIAGNOSIS — Z1231 Encounter for screening mammogram for malignant neoplasm of breast: Secondary | ICD-10-CM | POA: Diagnosis not present

## 2021-09-27 ENCOUNTER — Other Ambulatory Visit: Payer: Self-pay | Admitting: Cardiology

## 2021-10-05 ENCOUNTER — Other Ambulatory Visit: Payer: Self-pay | Admitting: Cardiology

## 2021-10-11 ENCOUNTER — Other Ambulatory Visit: Payer: Self-pay | Admitting: Cardiology

## 2021-10-21 ENCOUNTER — Other Ambulatory Visit: Payer: Self-pay | Admitting: Cardiology

## 2021-10-25 ENCOUNTER — Other Ambulatory Visit: Payer: Self-pay | Admitting: Cardiology

## 2021-10-27 ENCOUNTER — Other Ambulatory Visit: Payer: Self-pay | Admitting: Cardiology

## 2021-10-29 ENCOUNTER — Other Ambulatory Visit: Payer: Self-pay | Admitting: Cardiology

## 2021-11-07 ENCOUNTER — Other Ambulatory Visit: Payer: Self-pay | Admitting: Cardiology

## 2021-11-25 DIAGNOSIS — E1169 Type 2 diabetes mellitus with other specified complication: Secondary | ICD-10-CM | POA: Diagnosis not present

## 2021-11-25 DIAGNOSIS — I10 Essential (primary) hypertension: Secondary | ICD-10-CM | POA: Diagnosis not present

## 2021-11-29 ENCOUNTER — Other Ambulatory Visit: Payer: Self-pay | Admitting: Cardiology

## 2021-12-17 DIAGNOSIS — L821 Other seborrheic keratosis: Secondary | ICD-10-CM | POA: Diagnosis not present

## 2021-12-17 DIAGNOSIS — L570 Actinic keratosis: Secondary | ICD-10-CM | POA: Diagnosis not present

## 2021-12-17 DIAGNOSIS — Z85828 Personal history of other malignant neoplasm of skin: Secondary | ICD-10-CM | POA: Diagnosis not present

## 2021-12-17 DIAGNOSIS — D692 Other nonthrombocytopenic purpura: Secondary | ICD-10-CM | POA: Diagnosis not present

## 2021-12-26 ENCOUNTER — Other Ambulatory Visit: Payer: Self-pay | Admitting: Cardiology

## 2021-12-26 ENCOUNTER — Other Ambulatory Visit: Payer: Self-pay

## 2021-12-26 MED ORDER — ATORVASTATIN CALCIUM 10 MG PO TABS: 10.0000 mg | ORAL_TABLET | Freq: Every day | ORAL | 1 refills | Status: DC

## 2021-12-26 NOTE — Telephone Encounter (Signed)
Pt's medication was sent to pt's pharmacy as requested. Confirmation received.  °

## 2022-04-02 DIAGNOSIS — E1169 Type 2 diabetes mellitus with other specified complication: Secondary | ICD-10-CM | POA: Diagnosis not present

## 2022-04-02 DIAGNOSIS — R051 Acute cough: Secondary | ICD-10-CM | POA: Diagnosis not present

## 2022-04-02 DIAGNOSIS — I10 Essential (primary) hypertension: Secondary | ICD-10-CM | POA: Diagnosis not present

## 2022-04-17 ENCOUNTER — Ambulatory Visit: Payer: Medicare PPO | Attending: Cardiology | Admitting: Cardiology

## 2022-04-17 ENCOUNTER — Encounter: Payer: Self-pay | Admitting: Cardiology

## 2022-04-17 VITALS — BP 124/68 | HR 73 | Wt 165.0 lb

## 2022-04-17 DIAGNOSIS — I351 Nonrheumatic aortic (valve) insufficiency: Secondary | ICD-10-CM

## 2022-04-17 DIAGNOSIS — R0602 Shortness of breath: Secondary | ICD-10-CM

## 2022-04-17 DIAGNOSIS — I10 Essential (primary) hypertension: Secondary | ICD-10-CM | POA: Diagnosis not present

## 2022-04-17 DIAGNOSIS — I5032 Chronic diastolic (congestive) heart failure: Secondary | ICD-10-CM

## 2022-04-17 DIAGNOSIS — I2510 Atherosclerotic heart disease of native coronary artery without angina pectoris: Secondary | ICD-10-CM

## 2022-04-17 DIAGNOSIS — Z01812 Encounter for preprocedural laboratory examination: Secondary | ICD-10-CM

## 2022-04-17 DIAGNOSIS — I6523 Occlusion and stenosis of bilateral carotid arteries: Secondary | ICD-10-CM

## 2022-04-17 DIAGNOSIS — E785 Hyperlipidemia, unspecified: Secondary | ICD-10-CM

## 2022-04-17 LAB — BASIC METABOLIC PANEL
BUN/Creatinine Ratio: 15 (ref 12–28)
BUN: 11 mg/dL (ref 8–27)
CO2: 22 mmol/L (ref 20–29)
Calcium: 9.1 mg/dL (ref 8.7–10.3)
Chloride: 100 mmol/L (ref 96–106)
Creatinine, Ser: 0.74 mg/dL (ref 0.57–1.00)
Glucose: 179 mg/dL — ABNORMAL HIGH (ref 70–99)
Potassium: 4.5 mmol/L (ref 3.5–5.2)
Sodium: 138 mmol/L (ref 134–144)
eGFR: 83 mL/min/{1.73_m2} (ref >59)

## 2022-04-17 NOTE — Patient Instructions (Addendum)
Medication Instructions:  Your physician recommends that you continue on your current medications as directed. Please refer to the Current Medication list given to you today.  *If you need a refill on your cardiac medications before your next appointment, please call your pharmacy*   Lab Work: Please complete a Basic Metabolic Panel before you leave in our lab today.  If you have labs (blood work) drawn today and your tests are completely normal, you will receive your results only by: [place] (if you have MyChart) OR A paper copy in the mail If you have any lab test that is abnormal or we need to change your treatment, we will call you to review the results.   Testing/Procedures: Your physician has requested that you have an echocardiogram. Echocardiography is a painless test that uses sound waves to create images of your heart. It provides your doctor with information about the size and shape of your heart and how well your heart's chambers and valves are working. This procedure takes approximately one hour. There are no restrictions for this procedure. Please do NOT wear cologne, perfume, aftershave, or lotions (deodorant is allowed). Please arrive 15 minutes prior to your appointment time.    Your doctor also wants you to complete a cardiac Stress/PET/CT test.   How to Prepare for Your Cardiac PET/CT Stress Test:  1. Please do not take these medications before your test:   Medications that may interfere with the cardiac pharmacological stress agent (ex. nitrates - including erectile dysfunction medications, isosorbide mononitrate or beta-blockers) the day of the exam. (Erectile dysfunction medication should be held for at least 72 hrs prior to test) Theophylline containing medications for 12 hours. Dipyridamole 48 hours prior to the test. Your remaining medications may be taken with water.  2. Nothing to eat or drink, except water, 3 hours prior to arrival time.   NO  caffeine/decaffeinated products, or chocolate 12 hours prior to arrival.  3. NO perfume, cologne or lotion  4. Total time is 1 to 2 hours; you may want to bring reading material for the waiting time.  5. Please report to Admitting at the [facility] Main Entrance 30 minutes early for your test.  [place], [place] [id number]  Diabetic Preparation:  Hold oral medications. You may take NPH and Lantus insulin. Do not take Humalog or Humulin R (Regular Insulin) the day of your test. Check blood sugars prior to leaving the house. If able to eat breakfast prior to 3 hour fasting, you may take all medications, including your insulin, Do not worry if you miss your breakfast dose of insulin - start at your next meal.  IF YOU THINK YOU MAY BE PREGNANT, OR ARE NURSING PLEASE INFORM THE TECHNOLOGIST.  In preparation for your appointment, medication and supplies will be purchased.  Appointment availability is limited, so if you need to cancel or reschedule, please call the Radiology Department at [phone]  24 hours in advance to avoid a cancellation fee of $100.00  What to Expect After you Arrive:  Once you arrive and check in for your appointment, you will be taken to a preparation room within the Radiology Department.  A technologist or Nurse will obtain your medical history, verify that you are correctly prepped for the exam, and explain the procedure.  Afterwards,  an IV will be started in your arm and electrodes will be placed on your skin for EKG monitoring during the stress portion of the exam. Then you  will be escorted to the PET/CT scanner.  There, staff will get you positioned on the scanner and obtain a blood pressure and EKG.  During the exam, you will continue to be connected to the EKG and blood pressure machines.  A small, safe amount of a radioactive tracer will be injected in your IV to obtain a series of pictures of your heart along with an injection of a  stress agent.    After your Exam:  It is recommended that you eat a meal and drink a caffeinated beverage to counter act any effects of the stress agent.  Drink plenty of fluids for the remainder of the day and urinate frequently for the first couple of hours after the exam.  Your doctor will inform you of your test results within 7-10 business days.  For questions about your test or how to prepare for your test, please call: Marchia Bond, Cardiac Imaging Nurse Navigator  Gordy Clement, Cardiac Imaging Nurse Navigator Office: [phone]    Follow-Up: At [facility], you and your health needs are our priority.  As part of our continuing mission to provide you with exceptional heart care, we have created designated Provider Care Teams.  These Care Teams include your primary Cardiologist (physician) and Advanced Practice Providers (APPs -  Physician Assistants and Nurse Practitioners) who all work together to provide you with the care you need, when you need it.  We recommend signing up for the patient portal called "MyChart".  Sign up information is provided on this After Visit Summary.  MyChart is used to connect with patients for Virtual Visits (Telemedicine).  Patients are able to view lab/test results, encounter notes, upcoming appointments, etc.  Non-urgent messages can be sent to your provider as well.   To learn more about what you can do with MyChart, go to [email or web address].    Your next appointment:   1 year(s)  Provider:   Fransico Him, MD

## 2022-04-17 NOTE — Addendum Note (Signed)
Addended by: Joni Reining on: 04/17/2022 09:40 AM   Modules accepted: Orders

## 2022-04-17 NOTE — Progress Notes (Signed)
9 Date:  04/17/2022   ID:  [name] [name], DOB 1946/01/27, MRN [id number]   PCP:  Lajean Manes, MD  Cardiologist:  Fransico Him, MD  Electrophysiologist:  None   Chief Complaint:  AI, HTN, DCM, CAD, lipids  History of Present Illness:     [name] [name] is a 77 y.o. female with a hx of PVC's, dilated aortic root, severe AR, HTN, severe LV dysfunction with EF 25% (now normalized), severe mid LAD stenosis s/p 1 vessel CABG (LIMA to LAD) and Bentall procedure with aortic root replacement with pericardial tissue AVR .    She is here today for followup and is doing well.  She says that over the past year she has noticed DOE that she mainly notices when she walks.  She also feels more fatigued.  She denies any chest pain or pressure, PND, orthopnea, LE edema, dizziness, palpitations or syncope. She is compliant with her meds and is tolerating meds with no SE.    Prior CV studies:   The following studies were reviewed today:  EKG  2D echo 09/2019 IMPRESSIONS    1. Left ventricular ejection fraction, by estimation, is 50 to 55%. The  left ventricle has low normal function. The left ventricle has no regional  wall motion abnormalities. Left ventricular diastolic parameters are  consistent with Grade II diastolic  dysfunction (pseudonormalization).   2. Right ventricular systolic function is normal. The right ventricular  size is normal. There is normal pulmonary artery systolic pressure.   3. Left atrial size was mildly dilated.   4. Right atrial size was mildly dilated.   5. The mitral valve is normal in structure. Trivial mitral valve  regurgitation. No evidence of mitral stenosis.   6. The aortic valve has been repaired/replaced. Aortic valve  regurgitation is not visualized. No aortic stenosis is present.   Past Medical History:  Diagnosis Date   Adenomatous colon polyp    Aortic regurgitation    severe by TEE s/p pericardia tissue AVR   Arthritis    Cardiomyopathy ([place])     EF initally 25% but now normalized   Carotid stenosis 08/17/2017   1-39% bilateral by dopplers 2022   Chronic diastolic CHF (congestive heart failure), NYHA class 2 ([place])    Coronary artery disease 10/05/2013   Severe mid LAD stenosis s/p 1 vessel CABG with LIMA to LAD   Diabetes mellitus without complication ([place])    DX 20 YRS AGO.   TYPE 2   Dilated aortic root ([facility])    GERD (gastroesophageal reflux disease)    Tums prn   Gout    Hypercholesteremia    LDL goal < 70   Hypertension    S/P Bio-Bentall aortic root replacement with bioprosthetic valve conduit 10/27/2013   23 mm [facility] Ease bovine pericardial tissue valve and 26 mm Vascutek gelweave Val-Salva aortic graft   S/P CABG x 1 10/27/2013   LIMA to LAD   Thyroiditis 10/2009   lab and u/s-thyroid function normalized in 9/11   Past Surgical History:  Procedure Laterality Date   ABDOMINAL HYSTERECTOMY     AORTIC VALVE REPLACEMENT N/A 10/27/2013   Procedure: AORTIC VALVE REPLACEMENT (AVR);  Surgeon: Rexene Alberts, MD;  Location: [place];  Service: Open Heart Surgery;  Laterality: N/A;   ASCENDING AORTIC ROOT REPLACEMENT N/A 10/27/2013   Procedure: ASCENDING AORTIC ROOT REPLACEMENT;  Surgeon: Rexene Alberts, MD;  Location: [place];  Service: Open Heart Surgery;  Laterality: N/A;  BUNIONECTOMY     bilateral   CARDIAC CATHETERIZATION     CARDIAC VALVE REPLACEMENT     AORTIC VALVE   CARPAL TUNNEL RELEASE Right    CATARACT EXTRACTION Bilateral    COLONOSCOPY     COLONOSCOPY WITH PROPOFOL N/A 05/08/2015   Procedure: COLONOSCOPY WITH PROPOFOL;  Surgeon: Garlan Fair, MD;  Location: [facility];  Service: Endoscopy;  Laterality: N/A;   CORONARY ARTERY BYPASS GRAFT N/A 10/27/2013   Procedure: CORONARY ARTERY BYPASS GRAFTING (CABG) x 1 - LIMA to LAD;  Surgeon: Rexene Alberts, MD;  Location: [place];  Service: Open Heart Surgery;  Laterality: N/A;   EYE SURGERY     BILATERAL CATARACTS   INTRAOPERATIVE TRANSESOPHAGEAL  ECHOCARDIOGRAM N/A 10/27/2013   Procedure: INTRAOPERATIVE TRANSESOPHAGEAL ECHOCARDIOGRAM;  Surgeon: Rexene Alberts, MD;  Location: [place];  Service: Open Heart Surgery;  Laterality: N/A;   JOINT REPLACEMENT     left total knee   KNEE ARTHROPLASTY Right 01/26/2017   Procedure: RIGHT TOTAL KNEE ARTHROPLASTY WITH COMPUTER NAVIGATION;  Surgeon: Rod Can, MD;  Location: [place];  Service: Orthopedics;  Laterality: Right;  Needs RNFA   KNEE ARTHROPLASTY Left 05/28/2017   Procedure: LEFT TOTAL KNEE ARTHROPLASTY WITH COMPUTER NAVIGATION;  Surgeon: Rod Can, MD;  Location: [facility];  Service: Orthopedics;  Laterality: Left;  Needs RNFA   LEFT AND RIGHT HEART CATHETERIZATION WITH CORONARY ANGIOGRAM N/A 10/05/2013   Procedure: LEFT AND RIGHT HEART CATHETERIZATION WITH CORONARY ANGIOGRAM;  Surgeon: Blane Ohara, MD;  [facility] C[facility] LAB;  Service: Cardiovascular;  Laterality: N/A;   TEE WITHOUT CARDIOVERSION N/A 10/04/2013   Procedure: TRANSESOPHAGEAL ECHOCARDIOGRAM (TEE);  Surgeon: Sueanne Margarita, MD;  Location: [facility] ENDOSCOPY;  Service: Cardiovascular;  Laterality: N/A;   tubular adenomatous polyp colonoscopy  12/31/2009     Current Meds  Medication Sig   aspirin 81 MG chewable tablet Chew 1 tablet (81 mg total) by mouth 2 (two) times daily.   atorvastatin (LIPITOR) 10 MG tablet Take 1 tablet (10 mg total) by mouth daily.   Calcium Carb-Cholecalciferol (CALCIUM 600-D PO) Take 1 tablet by mouth daily.   carvedilol (COREG) 6.25 MG tablet Take 1 tablet (6.25 mg total) by mouth 2 (two) times daily with a meal.   clindamycin (CLEOCIN) 150 MG capsule Take 4 capsules by mouth as needed. (Take one (1) hour prior to dental procedures)   colchicine 0.6 MG tablet Take 0.6 mg by mouth daily.   fluticasone (FLONASE) 50 MCG/ACT nasal spray Place 1 spray into both nostrils 2 (two) times daily.   glimepiride (AMARYL) 2 MG tablet Take 2 mg by mouth daily with breakfast.   GLUCOSAMINE-CHONDROITIN PO Take 1  tablet by mouth daily. Glucosamine 1200 mg, chondroitin 1500 mg   losartan (COZAAR) 25 MG tablet Take 1 tablet (25 mg total) by mouth daily.   metFORMIN (GLUCOPHAGE-XR) 500 MG 24 hr tablet Take 500 mg by mouth in the morning and at bedtime.    Multiple Vitamin (MULTIVITAMIN WITH MINERALS) TABS tablet Take 1 tablet by mouth daily. Centrum Silver   naproxen sodium (ALEVE) 220 MG tablet Take 440 mg by mouth daily as needed (headaches).   ONE TOUCH ULTRA TEST test strip    pramipexole (MIRAPEX) 0.125 MG tablet Take 0.125 mg by mouth at bedtime.   ranitidine (ZANTAC) 150 MG tablet Take 150 mg by mouth daily as needed for heartburn.   [DISCONTINUED] docusate sodium (COLACE) 100 MG capsule Take 1 capsule (100 mg total) by mouth 2 (  two) times daily.     Allergies:   Atenolol, Lisinopril, Penicillins, and Sulfa antibiotics   Social History   Tobacco Use   Smoking status: Never   Smokeless tobacco: Never  Vaping Use   Vaping Use: Never used  Substance Use Topics   Alcohol use: No   Drug use: No     Family Hx: The patient's family history includes Arthritis/Rheumatoid in her mother; Cancer in her brother; Diabetes in her mother; Heart attack in her father; Hypertension in her mother; Stroke in her sister.  ROS:   Please see the history of present illness.     All other systems reviewed and are negative.   Labs/Other Tests and Data Reviewed:    Recent Labs: No results found for requested labs within last 365 days.   Recent Lipid Panel No results found for: "CHOL", "TRIG", "HDL", "CHOLHDL", "LDLCALC", "LDLDIRECT"  Wt Readings from Last 3 Encounters:  04/17/22 165 lb (74.8 kg)  08/20/20 165 lb (74.8 kg)  08/25/19 169 lb (76.7 kg)     Objective:    Vital Signs:  BP 124/68   Pulse 73   Wt 165 lb (74.8 kg)   SpO2 97%   BMI 31.18 kg/m    GEN: Well nourished, well developed in no acute distress HEENT: Normal NECK: No JVD; No carotid bruits LYMPHATICS: No  lymphadenopathy CARDIAC:RRR, no rubs, gallops.  2/6 SM at RUSB RESPIRATORY:  Clear to auscultation without rales, wheezing or rhonchi  ABDOMEN: Soft, non-tender, non-distended MUSCULOSKELETAL:  No edema; No deformity  SKIN: Warm and dry NEUROLOGIC:  Alert and oriented x 3 PSYCHIATRIC:  Normal affect   EKG was performed today and showed NSR with PVCs, nonspecific IVCD  ASSESSMENT & PLAN:    1.  ASCAD  - remote cath showed severe mid LAD stenosis s/p 1 vessel CABG (LIMA to LAD) at the time of her Bentall procedure in 2015.   -she denies any anginal chest pain but has been having DOE that may be an anginal equivalent -I will get a Lexiscan PET CT to rule out ischemia -Shared Decision Making/Informed Consent{    : The risks [chest pain, shortness of breath, cardiac arrhythmias, dizziness, blood pressure fluctuations, myocardial infarction, stroke/transient ischemic attack, nausea, vomiting, allergic reaction, radiation exposure, metallic taste sensation and life-threatening complications (estimated to be 1 in 10,000)], benefits (risk stratification, diagnosing coronary artery disease, treatment guidance) and alternatives of a cardiac PET stress test were discussed in detail with Ms. Pizana and she agrees to proceed. -check 2D echo to assess LVF given SOB -Continue prescription drug with aspirin 81 mg daily, atorvastatin 10 mg daily, carvedilol 6.25 mg twice daily with as needed refills  2.  Hypertension  -BP is adequately controlled on exam today -Continue prescription drug management with carvedilol 6.25 mg twice daily, losartan 25 mg daily with as needed refills -Check bmet today  3.  Severe AI  - she is status post Bentall procedure with aortic root replacement in 2015.   -Her l last 2D echo was done 09/2019 and showed stable AVR with mean AVG 57mHg  4.  Chronic diastolic CHF  -She has not had to use any diuretics and appears euvolemic on exam today  5.  Hyperlipidemia  -her LDL goal  is less than 70.  -Check FLP and ALT -Continue prescription drug management with atorvastatin 10 mg daily with as needed refills   6.  Bilateral carotid artery stenosis  -Dopplers done 08/24/2020 showed 1 to 39% bilateral carotid  stenosis -Continue aspirin and statin therapy -Repeat Dopplers 08/2022  followup with me in 1 year   Medication Adjustments/Labs and Tests Ordered: Current medicines are reviewed at length with the patient today.  Concerns regarding medicines are outlined above.  Tests Ordered: No orders of the defined types were placed in this encounter.  Medication Changes: No orders of the defined types were placed in this encounter.   Disposition:  Follow up in 1 year(s)  Signed, Fransico Him, MD  04/17/2022 9:05 AM    [place]

## 2022-04-17 NOTE — Addendum Note (Signed)
Addended by: Sueanne Margarita on: 04/17/2022 09:47 AM   Modules accepted: Orders

## 2022-04-21 ENCOUNTER — Telehealth: Payer: Self-pay

## 2022-04-22 ENCOUNTER — Other Ambulatory Visit: Payer: Self-pay | Admitting: Cardiology

## 2022-04-23 NOTE — Telephone Encounter (Signed)
The patient has been notified of the result and verbalized understanding.  All questions (if any) were answered.     

## 2022-04-25 ENCOUNTER — Telehealth (HOSPITAL_COMMUNITY): Payer: Self-pay | Admitting: Emergency Medicine

## 2022-04-25 NOTE — Telephone Encounter (Signed)
Reaching out to patient to offer assistance regarding upcoming cardiac imaging study; pt verbalizes understanding of appt date/time, parking situation and where to check in, pre-test NPO status and medications ordered, and verified current allergies; name and call back number provided for further questions should they arise Marchia Bond RN Navigator Cardiac Imaging [facility] [phone] office [phone] cell  Arrival [address] [facility] [address] Daily meds no carvedilol  Difficult IV  No food  No caffeine

## 2022-04-28 ENCOUNTER — Ambulatory Visit (HOSPITAL_COMMUNITY): Payer: Medicare PPO | Attending: Cardiovascular Disease

## 2022-04-28 DIAGNOSIS — R0602 Shortness of breath: Secondary | ICD-10-CM | POA: Insufficient documentation

## 2022-04-28 DIAGNOSIS — I2510 Atherosclerotic heart disease of native coronary artery without angina pectoris: Secondary | ICD-10-CM | POA: Diagnosis not present

## 2022-04-28 LAB — ECHOCARDIOGRAM COMPLETE
AR max vel: 1.37 cm2
AV Area VTI: 1.34 cm2
AV Area mean vel: 1.32 cm2
AV Mean grad: 11.3 mmHg
AV Peak grad: 20.2 mmHg
Ao pk vel: 2.25 m/s
Area-P 1/2: 2.13 cm2
P 1/2 time: 468 msec
S' Lateral: 3.2 cm

## 2022-04-29 ENCOUNTER — Encounter (HOSPITAL_COMMUNITY)
Admission: RE | Admit: 2022-04-29 | Discharge: 2022-04-29 | Disposition: A | Discharge status: Home / Self Care | Payer: Medicare PPO | Source: Ambulatory Visit | Attending: Cardiology | Admitting: Cardiology

## 2022-04-29 ENCOUNTER — Telehealth: Payer: Self-pay | Admitting: Cardiology

## 2022-04-29 DIAGNOSIS — I2510 Atherosclerotic heart disease of native coronary artery without angina pectoris: Secondary | ICD-10-CM | POA: Diagnosis present

## 2022-04-29 DIAGNOSIS — R0602 Shortness of breath: Secondary | ICD-10-CM

## 2022-04-29 LAB — NM PET CT CARDIAC PERFUSION MULTI W/ABSOLUTE BLOODFLOW
LV dias vol: 102 mL (ref 46–106)
LV sys vol: 51 mL
MBFR: 2.19
Nuc Rest EF: 50 %
Nuc Stress EF: 55 %
Rest MBF: 0.9 ml/g/min
Rest Nuclear Isotope Dose: 19.6 mCi
ST Depression (mm): 0 mm
Stress MBF: 1.97 ml/g/min
Stress Nuclear Isotope Dose: 19.3 mCi

## 2022-04-29 MED ORDER — REGADENOSON 0.4 MG/5ML IV SOLN
INTRAVENOUS | Status: AC
  Filled 2022-04-29: qty 5

## 2022-04-29 MED ORDER — RUBIDIUM RB82 GENERATOR (RUBYFILL)
19.6000 | PACK | Freq: Once | INTRAVENOUS | Status: AC
  Administered 2022-04-29: 19.6 via INTRAVENOUS

## 2022-04-29 MED ORDER — REGADENOSON 0.4 MG/5ML IV SOLN
0.4000 mg | Freq: Once | INTRAVENOUS | Status: AC
  Administered 2022-04-29: 0.4 mg via INTRAVENOUS

## 2022-04-29 MED ORDER — RUBIDIUM RB82 GENERATOR (RUBYFILL)
19.3000 | PACK | Freq: Once | INTRAVENOUS | Status: AC
  Administered 2022-04-29: 19.3 via INTRAVENOUS

## 2022-04-29 NOTE — Telephone Encounter (Signed)
The patient has been notified of the result and verbalized understanding.  All questions (if any) were answered. Bernestine Amass, RN 04/29/2022 8:29 AM

## 2022-04-29 NOTE — Telephone Encounter (Signed)
-----  Message from Nuala Alpha, LPN sent at 2/64/1583 11:28 AM EST -----  ----- Message ----- From: Sueanne Margarita, MD Sent: 04/28/2022  11:19 AM EST To: [facility] Triage  2D echo showed normal heart function with enlarged left atrium, mildly leaky mitral valve, stable aortic root/ascending aortic repair and is stable aortic valve repair with mildly leaky aortic valve and mildly leaky mitral valve -repeat 2D echo in 1 year for AI

## 2022-04-29 NOTE — Telephone Encounter (Signed)
  Pt is returning call to get echo result. Pt said, she has procedure at [facility] hospital at 11 am

## 2022-05-06 ENCOUNTER — Telehealth: Payer: Self-pay | Admitting: Cardiology

## 2022-05-06 DIAGNOSIS — R0602 Shortness of breath: Secondary | ICD-10-CM

## 2022-05-06 NOTE — Telephone Encounter (Signed)
Pt returning nurses call regarding results. Please advise 

## 2022-05-06 NOTE — Telephone Encounter (Signed)
-----  Message from Sueanne Margarita, MD sent at 04/29/2022  9:18 PM EST ----- Please let patient know that stress test was fine>>refer to Pulmonary for SOB workup

## 2022-05-06 NOTE — Telephone Encounter (Signed)
The patient has been notified of the result and verbalized understanding.  All questions (if any) were answered. Bernestine Amass, RN 05/06/2022 3:05 PM  Patient declines referral to pulmonary. She will call us back if she changes her mind.

## 2022-05-23 ENCOUNTER — Other Ambulatory Visit: Payer: Self-pay | Admitting: Internal Medicine

## 2022-05-23 DIAGNOSIS — Z780 Asymptomatic menopausal state: Secondary | ICD-10-CM

## 2022-07-07 DIAGNOSIS — R051 Acute cough: Secondary | ICD-10-CM | POA: Diagnosis not present

## 2022-07-07 DIAGNOSIS — J029 Acute pharyngitis, unspecified: Secondary | ICD-10-CM | POA: Diagnosis not present

## 2022-07-07 DIAGNOSIS — R0981 Nasal congestion: Secondary | ICD-10-CM | POA: Diagnosis not present

## 2022-07-16 DIAGNOSIS — E119 Type 2 diabetes mellitus without complications: Secondary | ICD-10-CM | POA: Diagnosis not present

## 2022-07-16 DIAGNOSIS — J101 Influenza due to other identified influenza virus with other respiratory manifestations: Secondary | ICD-10-CM | POA: Diagnosis not present

## 2022-07-16 DIAGNOSIS — J40 Bronchitis, not specified as acute or chronic: Secondary | ICD-10-CM | POA: Diagnosis not present

## 2022-07-16 DIAGNOSIS — Z03818 Encounter for observation for suspected exposure to other biological agents ruled out: Secondary | ICD-10-CM | POA: Diagnosis not present

## 2022-07-20 ENCOUNTER — Other Ambulatory Visit: Payer: Self-pay | Admitting: Cardiology

## 2022-07-21 ENCOUNTER — Ambulatory Visit
Admission: RE | Admit: 2022-07-21 | Discharge: 2022-07-21 | Disposition: A | Discharge status: Home / Self Care | Payer: Medicare PPO | Source: Ambulatory Visit | Attending: Internal Medicine | Admitting: Internal Medicine

## 2022-07-21 ENCOUNTER — Other Ambulatory Visit: Payer: Self-pay | Admitting: Internal Medicine

## 2022-07-21 DIAGNOSIS — M545 Low back pain, unspecified: Secondary | ICD-10-CM | POA: Diagnosis not present

## 2022-07-21 DIAGNOSIS — M5136 Other intervertebral disc degeneration, lumbar region: Secondary | ICD-10-CM | POA: Diagnosis not present

## 2022-07-21 DIAGNOSIS — R059 Cough, unspecified: Secondary | ICD-10-CM | POA: Diagnosis not present

## 2022-08-01 DIAGNOSIS — M25561 Pain in right knee: Secondary | ICD-10-CM | POA: Diagnosis not present

## 2022-08-11 DIAGNOSIS — M7051 Other bursitis of knee, right knee: Secondary | ICD-10-CM | POA: Diagnosis not present

## 2022-08-11 DIAGNOSIS — Z96651 Presence of right artificial knee joint: Secondary | ICD-10-CM | POA: Diagnosis not present

## 2022-08-11 DIAGNOSIS — M7631 Iliotibial band syndrome, right leg: Secondary | ICD-10-CM | POA: Diagnosis not present

## 2022-08-11 DIAGNOSIS — M7061 Trochanteric bursitis, right hip: Secondary | ICD-10-CM | POA: Diagnosis not present

## 2022-08-28 DIAGNOSIS — M25561 Pain in right knee: Secondary | ICD-10-CM | POA: Diagnosis not present

## 2022-09-03 DIAGNOSIS — M25561 Pain in right knee: Secondary | ICD-10-CM | POA: Diagnosis not present

## 2022-09-09 DIAGNOSIS — M25561 Pain in right knee: Secondary | ICD-10-CM | POA: Diagnosis not present

## 2022-09-16 ENCOUNTER — Other Ambulatory Visit: Payer: Self-pay | Admitting: Cardiology

## 2022-09-16 DIAGNOSIS — M25561 Pain in right knee: Secondary | ICD-10-CM | POA: Diagnosis not present

## 2022-09-18 DIAGNOSIS — M25561 Pain in right knee: Secondary | ICD-10-CM | POA: Diagnosis not present

## 2022-09-26 DIAGNOSIS — R300 Dysuria: Secondary | ICD-10-CM | POA: Diagnosis not present

## 2022-09-30 DIAGNOSIS — M25561 Pain in right knee: Secondary | ICD-10-CM | POA: Diagnosis not present

## 2022-10-02 DIAGNOSIS — M25561 Pain in right knee: Secondary | ICD-10-CM | POA: Diagnosis not present

## 2022-10-06 DIAGNOSIS — M25561 Pain in right knee: Secondary | ICD-10-CM | POA: Diagnosis not present

## 2022-10-22 DIAGNOSIS — M25561 Pain in right knee: Secondary | ICD-10-CM | POA: Diagnosis not present

## 2022-11-05 ENCOUNTER — Ambulatory Visit
Admission: RE | Admit: 2022-11-05 | Discharge: 2022-11-05 | Disposition: A | Discharge status: Home / Self Care | Payer: Medicare PPO | Source: Ambulatory Visit | Attending: Internal Medicine | Admitting: Internal Medicine

## 2022-11-05 DIAGNOSIS — E349 Endocrine disorder, unspecified: Secondary | ICD-10-CM | POA: Diagnosis not present

## 2022-11-05 DIAGNOSIS — Z780 Asymptomatic menopausal state: Secondary | ICD-10-CM

## 2022-11-05 DIAGNOSIS — N958 Other specified menopausal and perimenopausal disorders: Secondary | ICD-10-CM | POA: Diagnosis not present

## 2022-11-07 ENCOUNTER — Other Ambulatory Visit: Payer: Self-pay | Admitting: Internal Medicine

## 2022-11-07 DIAGNOSIS — Z1231 Encounter for screening mammogram for malignant neoplasm of breast: Secondary | ICD-10-CM

## 2022-11-17 DIAGNOSIS — M25561 Pain in right knee: Secondary | ICD-10-CM | POA: Diagnosis not present

## 2022-11-17 DIAGNOSIS — M25551 Pain in right hip: Secondary | ICD-10-CM | POA: Diagnosis not present

## 2022-11-17 DIAGNOSIS — I5032 Chronic diastolic (congestive) heart failure: Secondary | ICD-10-CM | POA: Diagnosis not present

## 2022-11-25 DIAGNOSIS — H02832 Dermatochalasis of right lower eyelid: Secondary | ICD-10-CM | POA: Diagnosis not present

## 2022-11-25 DIAGNOSIS — Z01818 Encounter for other preprocedural examination: Secondary | ICD-10-CM | POA: Diagnosis not present

## 2022-11-25 DIAGNOSIS — H02831 Dermatochalasis of right upper eyelid: Secondary | ICD-10-CM | POA: Diagnosis not present

## 2022-11-25 DIAGNOSIS — H0279 Other degenerative disorders of eyelid and periocular area: Secondary | ICD-10-CM | POA: Diagnosis not present

## 2022-11-25 DIAGNOSIS — H02834 Dermatochalasis of left upper eyelid: Secondary | ICD-10-CM | POA: Diagnosis not present

## 2022-11-25 DIAGNOSIS — H53483 Generalized contraction of visual field, bilateral: Secondary | ICD-10-CM | POA: Diagnosis not present

## 2022-11-25 DIAGNOSIS — H02835 Dermatochalasis of left lower eyelid: Secondary | ICD-10-CM | POA: Diagnosis not present

## 2022-11-25 DIAGNOSIS — H57813 Brow ptosis, bilateral: Secondary | ICD-10-CM | POA: Diagnosis not present

## 2022-12-04 ENCOUNTER — Ambulatory Visit
Admission: RE | Admit: 2022-12-04 | Discharge: 2022-12-04 | Disposition: A | Discharge status: Home / Self Care | Payer: Medicare PPO | Source: Ambulatory Visit | Attending: Internal Medicine | Admitting: Internal Medicine

## 2022-12-04 DIAGNOSIS — Z1231 Encounter for screening mammogram for malignant neoplasm of breast: Secondary | ICD-10-CM

## 2022-12-05 ENCOUNTER — Telehealth: Payer: Self-pay | Admitting: Cardiology

## 2022-12-05 DIAGNOSIS — H53483 Generalized contraction of visual field, bilateral: Secondary | ICD-10-CM | POA: Diagnosis not present

## 2022-12-05 NOTE — Telephone Encounter (Signed)
Please find a medical clearance form placed in Dr. Norris Cross mail box.

## 2022-12-09 ENCOUNTER — Telehealth: Payer: Self-pay | Admitting: Cardiology

## 2022-12-09 ENCOUNTER — Telehealth: Payer: Self-pay | Admitting: *Deleted

## 2022-12-09 ENCOUNTER — Telehealth: Payer: Self-pay

## 2022-12-09 NOTE — Telephone Encounter (Signed)
Pt is scheduled for 09/11 at 9am. Med rec and consent done

## 2022-12-09 NOTE — Telephone Encounter (Signed)
Medical clearance form given to [name], pre-op CMA.

## 2022-12-09 NOTE — Telephone Encounter (Signed)
   Name: [name] [name]  DOB: January 24, 1946  MRN: [id number]  Primary Cardiologist: Armanda Magic, MD   Preoperative team, please contact this patient and set up a phone call appointment for further preoperative risk assessment. Please obtain consent and complete medication review. Thank you for your help.  I confirm that guidance regarding antiplatelet and oral anticoagulation therapy has been completed and, if necessary, noted below.  Per office protocol, if patient is without any new symptoms or concerns at the time of their virtual visit, she may hold Aspirin for 5-7 days prior to procedure. Please resume Aspirin as soon as possible postprocedure, at the discretion of the surgeon.     Joylene Grapes, NP 12/09/2022, 11:33 AM [facility]

## 2022-12-09 NOTE — Telephone Encounter (Signed)
1st attempt at scheduling tele preop. [facility]trc.

## 2022-12-09 NOTE — Telephone Encounter (Signed)
Pt is scheduled for 09/11 at 9am. Med rec and consent done    Patient Consent for Virtual Visit        [name] [name] has provided verbal consent on 12/09/2022 for a virtual visit (video or telephone).   CONSENT FOR VIRTUAL VISIT [name]  By participating in this virtual visit I agree to the following:  I hereby voluntarily request, consent and authorize [facility] and its employed or contracted physicians, physician assistants, nurse practitioners or other licensed health care professionals (the Practitioner), to provide me with telemedicine health care services (the "Services") as deemed necessary by the treating Practitioner. I acknowledge and consent to receive the Services by the Practitioner via telemedicine. I understand that the telemedicine visit will involve communicating with the Practitioner through live audiovisual communication technology and the disclosure of certain medical information by electronic transmission. I acknowledge that I have been given the opportunity to request an in-person assessment or other available alternative prior to the telemedicine visit and am voluntarily participating in the telemedicine visit.  I understand that I have the right to withhold or withdraw my consent to the use of telemedicine in the course of my care at any time, without affecting my right to future care or treatment, and that the Practitioner or I may terminate the telemedicine visit at any time. I understand that I have the right to inspect all information obtained and/or recorded in the course of the telemedicine visit and may receive copies of available information for a reasonable fee.  I understand that some of the potential risks of receiving the Services via telemedicine include:  Delay or interruption in medical evaluation due to technological equipment failure or disruption; Information transmitted may not be sufficient (e.g. poor resolution of images) to  allow for appropriate medical decision making by the Practitioner; and/or  In rare instances, security protocols could fail, causing a breach of personal health information.  Furthermore, I acknowledge that it is my responsibility to provide information about my medical history, conditions and care that is complete and accurate to the best of my ability. I acknowledge that Practitioner's advice, recommendations, and/or decision may be based on factors not within their control, such as incomplete or inaccurate data provided by me or distortions of diagnostic images or specimens that may result from electronic transmissions. I understand that the practice of medicine is not an exact science and that Practitioner makes no warranties or guarantees regarding treatment outcomes. I acknowledge that a copy of this consent can be made available to me via my patient portal [facility] MyChart), or I can request a printed copy by calling the office of  [facility].    I understand that my insurance will be billed for this visit.   I have read or had this consent read to me. I understand the contents of this consent, which adequately explains the benefits and risks of the Services being provided via telemedicine.  I have been provided ample opportunity to ask questions regarding this consent and the Services and have had my questions answered to my satisfaction. I give my informed consent for the services to be provided through the use of telemedicine in my medical care

## 2022-12-09 NOTE — Telephone Encounter (Signed)
   Pre-operative Risk Assessment    Patient Name: [name] [name]  DOB: 1946/04/06 MRN: [id number]    DATE OF LAST VISIT: 04/17/22 DR. TURNER DATE OF NEX VISIT: NONE  Request for Surgical Clearance    Procedure:   B/ UPPER EYELID BLEPHAROPLASTY  Date of Surgery:  Clearance TBD                                 Surgeon:  DR. Dimas Millin Surgeon's Group or Practice Name:  [facility]  Phone number:  [phone] Fax number:  [phone]   Type of Clearance Requested:   - Medical ; ASA    Type of Anesthesia:  MAC   Additional requests/questions:    Elpidio Anis   12/09/2022, 11:11 AM

## 2022-12-09 NOTE — Telephone Encounter (Signed)
Pt returning call regarding appt. Please advise 

## 2022-12-15 DIAGNOSIS — Z96651 Presence of right artificial knee joint: Secondary | ICD-10-CM | POA: Diagnosis not present

## 2022-12-15 DIAGNOSIS — M7631 Iliotibial band syndrome, right leg: Secondary | ICD-10-CM | POA: Diagnosis not present

## 2022-12-15 DIAGNOSIS — M7051 Other bursitis of knee, right knee: Secondary | ICD-10-CM | POA: Diagnosis not present

## 2022-12-15 DIAGNOSIS — M7061 Trochanteric bursitis, right hip: Secondary | ICD-10-CM | POA: Diagnosis not present

## 2022-12-17 ENCOUNTER — Ambulatory Visit: Payer: Medicare PPO | Attending: Internal Medicine

## 2022-12-17 DIAGNOSIS — Z01810 Encounter for preprocedural cardiovascular examination: Secondary | ICD-10-CM | POA: Diagnosis not present

## 2022-12-17 NOTE — Progress Notes (Signed)
Virtual Visit via Telephone Note   Because of [name] [name]'s co-morbid illnesses, she is at least at moderate risk for complications without adequate follow up.  This format is felt to be most appropriate for this patient at this time.  The patient did not have access to video technology/had technical difficulties with video requiring transitioning to audio format only (telephone).  All issues noted in this document were discussed and addressed.  No physical exam could be performed with this format.  Please refer to the patient's chart for her consent to telehealth for [facility].  Evaluation Performed:  Preoperative cardiovascular risk assessment _____________   Date:  12/17/2022   Patient ID:  [name] [name], [name] [name] [birth year], MRN [id number] Patient Location:  Home Provider location:   Office  Primary Care Provider:  Thana Ates, MD Primary Cardiologist:  Armanda Magic, MD  Chief Complaint / Patient Profile   77 y.o. y/o female with a h/o coronary artery disease, hypertension, aortic valve insufficiency, chronic diastolic CHF, bilateral carotid artery stenosis, and shortness of breath who is pending bilateral upper eyelid blepharoplasty and presents today for telephonic preoperative cardiovascular risk assessment.  History of Present Illness    [name] [name] is a 77 y.o. female who presents via audio/video conferencing for a telehealth visit today.  Pt was last seen in cardiology clinic on 04/17/2022 by Dr. Mayford Knife.  At that time [name] [name] was doing well .  The patient is now pending procedure as outlined above. Since her last visit, she continues to be stable from a cardiac standpoint.  Today she denies chest pain, shortness of breath, lower extremity edema, fatigue, palpitations, melena, hematuria, hemoptysis, diaphoresis, weakness, presyncope, syncope, orthopnea, and PND.   Past Medical History    Past Medical History:  Diagnosis Date   Adenomatous colon  polyp    Aortic regurgitation    severe by TEE s/p pericardia tissue AVR   Arthritis    Cardiomyopathy (HCC)    EF initally 25% but now normalized   Carotid stenosis 08/17/2017   1-39% bilateral by dopplers 2022   Chronic diastolic CHF (congestive heart failure), NYHA class 2 (HCC)    Coronary artery disease 10/05/2013   Severe mid LAD stenosis s/p 1 vessel CABG with LIMA to LAD   Diabetes mellitus without complication (HCC)    DX 20 YRS AGO.   TYPE 2   Dilated aortic root (HCC)    GERD (gastroesophageal reflux disease)    Tums prn   Gout    Hypercholesteremia    LDL goal < 70   Hypertension    S/P Bio-Bentall aortic root replacement with bioprosthetic valve conduit 10/27/2013   23 mm [facility] Ease bovine pericardial tissue valve and 26 mm Vascutek gelweave Val-Salva aortic graft   S/P CABG x 1 10/27/2013   LIMA to LAD   Thyroiditis 10/2009   lab and u/s-thyroid function normalized in 9/11   Past Surgical History:  Procedure Laterality Date   ABDOMINAL HYSTERECTOMY     AORTIC VALVE REPLACEMENT N/A 10/27/2013   Procedure: AORTIC VALVE REPLACEMENT (AVR);  Surgeon: Purcell Nails, MD;  Location: [facility] OR;  Service: Open Heart Surgery;  Laterality: N/A;   ASCENDING AORTIC ROOT REPLACEMENT N/A 10/27/2013   Procedure: ASCENDING AORTIC ROOT REPLACEMENT;  Surgeon: Purcell Nails, MD;  Location: [facility] OR;  Service: Open Heart Surgery;  Laterality: N/A;   BUNIONECTOMY     bilateral   CARDIAC CATHETERIZATION  CARDIAC VALVE REPLACEMENT     AORTIC VALVE   CARPAL TUNNEL RELEASE Right    CATARACT EXTRACTION Bilateral    COLONOSCOPY     COLONOSCOPY WITH PROPOFOL N/A 05/08/2015   Procedure: COLONOSCOPY WITH PROPOFOL;  Surgeon: Charolett Bumpers, MD;  Location: [facility] ENDOSCOPY;  Service: Endoscopy;  Laterality: N/A;   CORONARY ARTERY BYPASS GRAFT N/A 10/27/2013   Procedure: CORONARY ARTERY BYPASS GRAFTING (CABG) x 1 - LIMA to LAD;  Surgeon: Purcell Nails, MD;  Location: [facility] OR;  Service: Open  Heart Surgery;  Laterality: N/A;   EYE SURGERY     BILATERAL CATARACTS   INTRAOPERATIVE TRANSESOPHAGEAL ECHOCARDIOGRAM N/A 10/27/2013   Procedure: INTRAOPERATIVE TRANSESOPHAGEAL ECHOCARDIOGRAM;  Surgeon: Purcell Nails, MD;  Location: [facility] OR;  Service: Open Heart Surgery;  Laterality: N/A;   JOINT REPLACEMENT     left total knee   KNEE ARTHROPLASTY Right 01/26/2017   Procedure: RIGHT TOTAL KNEE ARTHROPLASTY WITH COMPUTER NAVIGATION;  Surgeon: Samson Frederic, MD;  Location: [facility] OR;  Service: Orthopedics;  Laterality: Right;  Needs RNFA   KNEE ARTHROPLASTY Left 05/28/2017   Procedure: LEFT TOTAL KNEE ARTHROPLASTY WITH COMPUTER NAVIGATION;  Surgeon: Samson Frederic, MD;  Location: [facility] ORS;  Service: Orthopedics;  Laterality: Left;  Needs RNFA   LEFT AND RIGHT HEART CATHETERIZATION WITH CORONARY ANGIOGRAM N/A 10/05/2013   Procedure: LEFT AND RIGHT HEART CATHETERIZATION WITH CORONARY ANGIOGRAM;  Surgeon: Micheline Chapman, MD;  Location: [facility] CATH LAB;  Service: Cardiovascular;  Laterality: N/A;   TEE WITHOUT CARDIOVERSION N/A 10/04/2013   Procedure: TRANSESOPHAGEAL ECHOCARDIOGRAM (TEE);  Surgeon: Quintella Reichert, MD;  Location: [facility] ENDOSCOPY;  Service: Cardiovascular;  Laterality: N/A;   tubular adenomatous polyp colonoscopy  12/31/2009    Allergies  Allergies  Allergen Reactions   Atenolol Shortness Of Breath and Other (See Comments)    Edema and Headache   Lisinopril Cough   Penicillins Rash and Other (See Comments)    Has patient had a PCN reaction causing immediate rash, facial/tongue/throat swelling, SOB or lightheadedness with hypotension: No Has patient had a PCN reaction causing severe rash involving mucus membranes or skin necrosis: No Has patient had a PCN reaction that required hospitalization No Has patient had a PCN reaction occurring within the last 10 years: No If all of the above answers are "NO", then may proceed with Cephalosporin use.G Benzathine: Local injection rash   Sulfa  Antibiotics Hives and Rash    Home Medications    Prior to Admission medications   Medication Sig Start Date End Date Taking? Authorizing Provider  aspirin 81 MG chewable tablet Chew 1 tablet (81 mg total) by mouth 2 (two) times daily. 05/29/17   Swinteck, Arlys John, MD  atorvastatin (LIPITOR) 10 MG tablet TAKE 1 TABLET BY MOUTH EVERY DAY 04/22/22   Quintella Reichert, MD  Calcium Carb-Cholecalciferol (CALCIUM 600-D PO) Take 1 tablet by mouth daily.    [provider]  carvedilol (COREG) 6.25 MG tablet TAKE 1 TABLET BY MOUTH 2 TIMES DAILY WITH A MEAL. 07/22/22   Quintella Reichert, MD  clindamycin (CLEOCIN) 150 MG capsule Take 4 capsules by mouth as needed. (Take one (1) hour prior to dental procedures) 03/26/15   [provider]  colchicine 0.6 MG tablet Take 0.6 mg by mouth daily.    [provider]  fluticasone (FLONASE) 50 MCG/ACT nasal spray Place 1 spray into both nostrils 2 (two) times daily.    [provider]  glimepiride (AMARYL) 2 MG tablet Take 2  mg by mouth daily with breakfast.    [provider]  GLUCOSAMINE-CHONDROITIN PO Take 1 tablet by mouth daily. Glucosamine 1200 mg, chondroitin 1500 mg    [provider]  losartan (COZAAR) 25 MG tablet TAKE 1 TABLET (25 MG TOTAL) BY MOUTH DAILY. 09/16/22   Quintella Reichert, MD  metFORMIN (GLUCOPHAGE-XR) 500 MG 24 hr tablet Take 500 mg by mouth in the morning and at bedtime.     [provider]  Multiple Vitamin (MULTIVITAMIN WITH MINERALS) TABS tablet Take 1 tablet by mouth daily. Centrum Silver    [provider]  naproxen sodium (ALEVE) 220 MG tablet Take 440 mg by mouth daily as needed (headaches).    [provider]  ONE TOUCH ULTRA TEST test strip  05/11/17   [provider]  pramipexole (MIRAPEX) 0.125 MG tablet Take 0.125 mg by mouth at bedtime. 12/26/16   [provider]  ranitidine (ZANTAC) 150 MG tablet Take 150 mg by mouth daily as needed for  heartburn.    [provider]    Physical Exam    Vital Signs:  [name] [name] does not have vital signs available for review today.  Given telephonic nature of communication, physical exam is limited. AAOx3. NAD. Normal affect.  Speech and respirations are unlabored.  Accessory Clinical Findings    None  Assessment & Plan    1.  Preoperative Cardiovascular Risk Assessment: Bilateral upper eyelid blepharoplasty, TBD, Dr. Thomas Hoff, [facility], fax #[phone]      Primary Cardiologist: Armanda Magic, MD  Chart reviewed as part of pre-operative protocol coverage. Given past medical history and time since last visit, based on ACC/AHA guidelines, [name] [name] would be at acceptable risk for the planned procedure without further cardiovascular testing.   Patient was advised that if she develops new symptoms prior to surgery to contact our office to arrange a follow-up appointment.  He verbalized understanding.  Her aspirin may be held for 5 to 7 days prior to her procedure.  Please resume as soon as hemostasis is achieved.  I will route this recommendation to the requesting party via Epic fax function and remove from pre-op pool.       Time:   Today, I have spent 5 minutes with the patient with telehealth technology discussing medical history, symptoms, and management plan.  Prior to patient's phone evaluation I spent greater than 10 minutes reviewing their past medical history and cardiac medications.    Ronney Asters, NP  12/17/2022, 7:09 AM

## 2023-01-19 DIAGNOSIS — M7051 Other bursitis of knee, right knee: Secondary | ICD-10-CM | POA: Diagnosis not present

## 2023-01-19 DIAGNOSIS — Z96651 Presence of right artificial knee joint: Secondary | ICD-10-CM | POA: Diagnosis not present

## 2023-01-19 DIAGNOSIS — M7631 Iliotibial band syndrome, right leg: Secondary | ICD-10-CM | POA: Diagnosis not present

## 2023-01-19 DIAGNOSIS — M545 Low back pain, unspecified: Secondary | ICD-10-CM | POA: Diagnosis not present

## 2023-01-26 DIAGNOSIS — H57813 Brow ptosis, bilateral: Secondary | ICD-10-CM | POA: Diagnosis not present

## 2023-01-26 DIAGNOSIS — H0279 Other degenerative disorders of eyelid and periocular area: Secondary | ICD-10-CM | POA: Diagnosis not present

## 2023-01-26 DIAGNOSIS — H02834 Dermatochalasis of left upper eyelid: Secondary | ICD-10-CM | POA: Diagnosis not present

## 2023-01-26 DIAGNOSIS — H02835 Dermatochalasis of left lower eyelid: Secondary | ICD-10-CM | POA: Diagnosis not present

## 2023-01-26 DIAGNOSIS — H02832 Dermatochalasis of right lower eyelid: Secondary | ICD-10-CM | POA: Diagnosis not present

## 2023-01-26 DIAGNOSIS — H53483 Generalized contraction of visual field, bilateral: Secondary | ICD-10-CM | POA: Diagnosis not present

## 2023-01-26 DIAGNOSIS — Z01818 Encounter for other preprocedural examination: Secondary | ICD-10-CM | POA: Diagnosis not present

## 2023-01-26 DIAGNOSIS — H02831 Dermatochalasis of right upper eyelid: Secondary | ICD-10-CM | POA: Diagnosis not present

## 2023-01-26 DIAGNOSIS — L987 Excessive and redundant skin and subcutaneous tissue: Secondary | ICD-10-CM | POA: Diagnosis not present

## 2023-01-26 DIAGNOSIS — H538 Other visual disturbances: Secondary | ICD-10-CM | POA: Diagnosis not present

## 2023-02-12 DIAGNOSIS — M5459 Other low back pain: Secondary | ICD-10-CM | POA: Diagnosis not present

## 2023-02-12 DIAGNOSIS — I700 Atherosclerosis of aorta: Secondary | ICD-10-CM | POA: Diagnosis not present

## 2023-02-12 DIAGNOSIS — E785 Hyperlipidemia, unspecified: Secondary | ICD-10-CM | POA: Diagnosis not present

## 2023-02-12 DIAGNOSIS — M25561 Pain in right knee: Secondary | ICD-10-CM | POA: Diagnosis not present

## 2023-02-24 DIAGNOSIS — M25561 Pain in right knee: Secondary | ICD-10-CM | POA: Diagnosis not present

## 2023-02-26 DIAGNOSIS — M25561 Pain in right knee: Secondary | ICD-10-CM | POA: Diagnosis not present

## 2023-03-03 DIAGNOSIS — M25561 Pain in right knee: Secondary | ICD-10-CM | POA: Diagnosis not present

## 2023-03-08 DIAGNOSIS — M5459 Other low back pain: Secondary | ICD-10-CM | POA: Diagnosis not present

## 2023-03-12 DIAGNOSIS — M25561 Pain in right knee: Secondary | ICD-10-CM | POA: Diagnosis not present

## 2023-03-15 ENCOUNTER — Emergency Department (HOSPITAL_COMMUNITY): Payer: Medicare PPO

## 2023-03-15 ENCOUNTER — Emergency Department (HOSPITAL_BASED_OUTPATIENT_CLINIC_OR_DEPARTMENT_OTHER): Payer: Medicare PPO

## 2023-03-15 ENCOUNTER — Observation Stay (HOSPITAL_COMMUNITY): Payer: Medicare PPO

## 2023-03-15 ENCOUNTER — Other Ambulatory Visit: Payer: Self-pay

## 2023-03-15 ENCOUNTER — Encounter (HOSPITAL_BASED_OUTPATIENT_CLINIC_OR_DEPARTMENT_OTHER): Payer: Self-pay | Admitting: Emergency Medicine

## 2023-03-15 ENCOUNTER — Inpatient Hospital Stay (HOSPITAL_BASED_OUTPATIENT_CLINIC_OR_DEPARTMENT_OTHER)
Admission: EM | Admit: 2023-03-15 | Discharge: 2023-03-20 | DRG: 100 | Disposition: A | Discharge status: Skilled Nursing Facility | Payer: Medicare PPO | Source: Ambulatory Visit | Attending: Internal Medicine | Admitting: Internal Medicine

## 2023-03-15 DIAGNOSIS — E1165 Type 2 diabetes mellitus with hyperglycemia: Secondary | ICD-10-CM | POA: Diagnosis present

## 2023-03-15 DIAGNOSIS — R4182 Altered mental status, unspecified: Secondary | ICD-10-CM | POA: Diagnosis not present

## 2023-03-15 DIAGNOSIS — R29818 Other symptoms and signs involving the nervous system: Secondary | ICD-10-CM | POA: Diagnosis not present

## 2023-03-15 DIAGNOSIS — R0989 Other specified symptoms and signs involving the circulatory and respiratory systems: Secondary | ICD-10-CM | POA: Diagnosis not present

## 2023-03-15 DIAGNOSIS — T426X5A Adverse effect of other antiepileptic and sedative-hypnotic drugs, initial encounter: Secondary | ICD-10-CM | POA: Diagnosis not present

## 2023-03-15 DIAGNOSIS — I700 Atherosclerosis of aorta: Secondary | ICD-10-CM | POA: Diagnosis not present

## 2023-03-15 DIAGNOSIS — T3695XA Adverse effect of unspecified systemic antibiotic, initial encounter: Secondary | ICD-10-CM | POA: Diagnosis not present

## 2023-03-15 DIAGNOSIS — G459 Transient cerebral ischemic attack, unspecified: Secondary | ICD-10-CM | POA: Diagnosis present

## 2023-03-15 DIAGNOSIS — M1A9XX0 Chronic gout, unspecified, without tophus (tophi): Secondary | ICD-10-CM | POA: Diagnosis not present

## 2023-03-15 DIAGNOSIS — G928 Other toxic encephalopathy: Secondary | ICD-10-CM | POA: Diagnosis present

## 2023-03-15 DIAGNOSIS — E538 Deficiency of other specified B group vitamins: Secondary | ICD-10-CM | POA: Diagnosis present

## 2023-03-15 DIAGNOSIS — Z79899 Other long term (current) drug therapy: Secondary | ICD-10-CM | POA: Diagnosis not present

## 2023-03-15 DIAGNOSIS — I10 Essential (primary) hypertension: Secondary | ICD-10-CM | POA: Diagnosis not present

## 2023-03-15 DIAGNOSIS — R569 Unspecified convulsions: Secondary | ICD-10-CM

## 2023-03-15 DIAGNOSIS — Z951 Presence of aortocoronary bypass graft: Secondary | ICD-10-CM

## 2023-03-15 DIAGNOSIS — I771 Stricture of artery: Secondary | ICD-10-CM | POA: Diagnosis not present

## 2023-03-15 DIAGNOSIS — E871 Hypo-osmolality and hyponatremia: Secondary | ICD-10-CM | POA: Diagnosis not present

## 2023-03-15 DIAGNOSIS — E785 Hyperlipidemia, unspecified: Secondary | ICD-10-CM | POA: Diagnosis not present

## 2023-03-15 DIAGNOSIS — R509 Fever, unspecified: Secondary | ICD-10-CM | POA: Diagnosis present

## 2023-03-15 DIAGNOSIS — Z952 Presence of prosthetic heart valve: Secondary | ICD-10-CM | POA: Diagnosis not present

## 2023-03-15 DIAGNOSIS — Z880 Allergy status to penicillin: Secondary | ICD-10-CM

## 2023-03-15 DIAGNOSIS — E119 Type 2 diabetes mellitus without complications: Secondary | ICD-10-CM

## 2023-03-15 DIAGNOSIS — Z860101 Personal history of adenomatous and serrated colon polyps: Secondary | ICD-10-CM

## 2023-03-15 DIAGNOSIS — I5032 Chronic diastolic (congestive) heart failure: Secondary | ICD-10-CM | POA: Diagnosis present

## 2023-03-15 DIAGNOSIS — Z7982 Long term (current) use of aspirin: Secondary | ICD-10-CM

## 2023-03-15 DIAGNOSIS — R1312 Dysphagia, oropharyngeal phase: Secondary | ICD-10-CM | POA: Diagnosis not present

## 2023-03-15 DIAGNOSIS — Z882 Allergy status to sulfonamides status: Secondary | ICD-10-CM

## 2023-03-15 DIAGNOSIS — Z1152 Encounter for screening for COVID-19: Secondary | ICD-10-CM

## 2023-03-15 DIAGNOSIS — Z96651 Presence of right artificial knee joint: Secondary | ICD-10-CM | POA: Diagnosis present

## 2023-03-15 DIAGNOSIS — R413 Other amnesia: Secondary | ICD-10-CM | POA: Diagnosis present

## 2023-03-15 DIAGNOSIS — Z87440 Personal history of urinary (tract) infections: Secondary | ICD-10-CM

## 2023-03-15 DIAGNOSIS — R41841 Cognitive communication deficit: Secondary | ICD-10-CM | POA: Diagnosis not present

## 2023-03-15 DIAGNOSIS — M4800 Spinal stenosis, site unspecified: Secondary | ICD-10-CM | POA: Diagnosis not present

## 2023-03-15 DIAGNOSIS — R4781 Slurred speech: Secondary | ICD-10-CM | POA: Diagnosis not present

## 2023-03-15 DIAGNOSIS — I6523 Occlusion and stenosis of bilateral carotid arteries: Secondary | ICD-10-CM | POA: Diagnosis not present

## 2023-03-15 DIAGNOSIS — Z7984 Long term (current) use of oral hypoglycemic drugs: Secondary | ICD-10-CM | POA: Diagnosis not present

## 2023-03-15 DIAGNOSIS — G40909 Epilepsy, unspecified, not intractable, without status epilepticus: Secondary | ICD-10-CM | POA: Diagnosis present

## 2023-03-15 DIAGNOSIS — Z741 Need for assistance with personal care: Secondary | ICD-10-CM | POA: Diagnosis not present

## 2023-03-15 DIAGNOSIS — E7800 Pure hypercholesterolemia, unspecified: Secondary | ICD-10-CM | POA: Diagnosis present

## 2023-03-15 DIAGNOSIS — Z8249 Family history of ischemic heart disease and other diseases of the circulatory system: Secondary | ICD-10-CM

## 2023-03-15 DIAGNOSIS — I2510 Atherosclerotic heart disease of native coronary artery without angina pectoris: Secondary | ICD-10-CM | POA: Diagnosis present

## 2023-03-15 DIAGNOSIS — K219 Gastro-esophageal reflux disease without esophagitis: Secondary | ICD-10-CM | POA: Diagnosis present

## 2023-03-15 DIAGNOSIS — R29810 Facial weakness: Secondary | ICD-10-CM | POA: Diagnosis not present

## 2023-03-15 DIAGNOSIS — Z833 Family history of diabetes mellitus: Secondary | ICD-10-CM

## 2023-03-15 DIAGNOSIS — Z90710 Acquired absence of both cervix and uterus: Secondary | ICD-10-CM

## 2023-03-15 DIAGNOSIS — Z823 Family history of stroke: Secondary | ICD-10-CM

## 2023-03-15 DIAGNOSIS — Z888 Allergy status to other drugs, medicaments and biological substances status: Secondary | ICD-10-CM

## 2023-03-15 DIAGNOSIS — E876 Hypokalemia: Secondary | ICD-10-CM | POA: Diagnosis present

## 2023-03-15 DIAGNOSIS — I110 Hypertensive heart disease with heart failure: Secondary | ICD-10-CM | POA: Diagnosis present

## 2023-03-15 DIAGNOSIS — I429 Cardiomyopathy, unspecified: Secondary | ICD-10-CM | POA: Diagnosis present

## 2023-03-15 DIAGNOSIS — R4701 Aphasia: Secondary | ICD-10-CM | POA: Diagnosis not present

## 2023-03-15 DIAGNOSIS — I672 Cerebral atherosclerosis: Secondary | ICD-10-CM | POA: Diagnosis not present

## 2023-03-15 DIAGNOSIS — M6259 Muscle wasting and atrophy, not elsewhere classified, multiple sites: Secondary | ICD-10-CM | POA: Diagnosis not present

## 2023-03-15 DIAGNOSIS — R9082 White matter disease, unspecified: Secondary | ICD-10-CM | POA: Diagnosis not present

## 2023-03-15 DIAGNOSIS — L271 Localized skin eruption due to drugs and medicaments taken internally: Secondary | ICD-10-CM | POA: Diagnosis not present

## 2023-03-15 DIAGNOSIS — R9431 Abnormal electrocardiogram [ECG] [EKG]: Secondary | ICD-10-CM | POA: Diagnosis not present

## 2023-03-15 LAB — I-STAT CHEM 8, ED
BUN: 8 mg/dL (ref 8–23)
Calcium, Ion: 1.01 mmol/L — ABNORMAL LOW (ref 1.15–1.40)
Chloride: 101 mmol/L (ref 98–111)
Creatinine, Ser: 0.7 mg/dL (ref 0.44–1.00)
Glucose, Bld: 101 mg/dL — ABNORMAL HIGH (ref 70–99)
HCT: 36 % (ref 36.0–46.0)
Hemoglobin: 12.2 g/dL (ref 12.0–15.0)
Potassium: 3.8 mmol/L (ref 3.5–5.1)
Sodium: 134 mmol/L — ABNORMAL LOW (ref 135–145)
TCO2: 21 mmol/L — ABNORMAL LOW (ref 22–32)

## 2023-03-15 LAB — URINALYSIS, W/ REFLEX TO CULTURE (INFECTION SUSPECTED)
Bilirubin Urine: NEGATIVE
Glucose, UA: NEGATIVE mg/dL
Ketones, ur: NEGATIVE mg/dL
Leukocytes,Ua: NEGATIVE
Nitrite: NEGATIVE
Protein, ur: NEGATIVE mg/dL
Specific Gravity, Urine: 1.026 (ref 1.005–1.030)
pH: 9 — ABNORMAL HIGH (ref 5.0–8.0)

## 2023-03-15 LAB — DIFFERENTIAL
Abs Immature Granulocytes: 0.02 10*3/uL (ref 0.00–0.07)
Basophils Absolute: 0.1 10*3/uL (ref 0.0–0.1)
Basophils Relative: 1 %
Eosinophils Absolute: 0.2 10*3/uL (ref 0.0–0.5)
Eosinophils Relative: 2 %
Immature Granulocytes: 0 %
Lymphocytes Relative: 27 %
Lymphs Abs: 2.7 10*3/uL (ref 0.7–4.0)
Monocytes Absolute: 0.6 10*3/uL (ref 0.1–1.0)
Monocytes Relative: 6 %
Neutro Abs: 6.4 10*3/uL (ref 1.7–7.7)
Neutrophils Relative %: 64 %

## 2023-03-15 LAB — CBC
HCT: 37.5 % (ref 36.0–46.0)
Hemoglobin: 12.8 g/dL (ref 12.0–15.0)
MCH: 32.5 pg (ref 26.0–34.0)
MCHC: 34.1 g/dL (ref 30.0–36.0)
MCV: 95.2 fL (ref 80.0–100.0)
Platelets: 199 10*3/uL (ref 150–400)
RBC: 3.94 MIL/uL (ref 3.87–5.11)
RDW: 13.2 % (ref 11.5–15.5)
WBC: 9.9 10*3/uL (ref 4.0–10.5)
nRBC: 0 % (ref 0.0–0.2)

## 2023-03-15 LAB — COMPREHENSIVE METABOLIC PANEL
ALT: 13 U/L (ref 0–44)
AST: 21 U/L (ref 15–41)
Albumin: 4.5 g/dL (ref 3.5–5.0)
Alkaline Phosphatase: 46 U/L (ref 38–126)
Anion gap: 10 (ref 5–15)
BUN: 13 mg/dL (ref 8–23)
CO2: 27 mmol/L (ref 22–32)
Calcium: 10 mg/dL (ref 8.9–10.3)
Chloride: 98 mmol/L (ref 98–111)
Creatinine, Ser: 0.74 mg/dL (ref 0.44–1.00)
GFR, Estimated: >60 mL/min (ref >60)
Glucose, Bld: 180 mg/dL — ABNORMAL HIGH (ref 70–99)
Potassium: 4.2 mmol/L (ref 3.5–5.1)
Sodium: 135 mmol/L (ref 135–145)
Total Bilirubin: 0.8 mg/dL (ref <1.2)
Total Protein: 7.9 g/dL (ref 6.5–8.1)

## 2023-03-15 LAB — RAPID URINE DRUG SCREEN, HOSP PERFORMED
Amphetamines: NOT DETECTED
Barbiturates: NOT DETECTED
Benzodiazepines: NOT DETECTED
Cocaine: NOT DETECTED
Opiates: NOT DETECTED
Tetrahydrocannabinol: NOT DETECTED

## 2023-03-15 LAB — RESPIRATORY PANEL BY PCR

## 2023-03-15 LAB — CREATININE, SERUM
Creatinine, Ser: 0.78 mg/dL (ref 0.44–1.00)
GFR, Estimated: >60 mL/min (ref >60)

## 2023-03-15 LAB — PROTIME-INR
INR: 1.1 (ref 0.8–1.2)
Prothrombin Time: 14 s (ref 11.4–15.2)

## 2023-03-15 LAB — RESP PANEL BY RT-PCR (RSV, FLU A&B, COVID)  RVPGX2
Influenza A by PCR: NEGATIVE
Influenza B by PCR: NEGATIVE
Resp Syncytial Virus by PCR: NEGATIVE
SARS Coronavirus 2 by RT PCR: NEGATIVE

## 2023-03-15 LAB — VITAMIN B12: Vitamin B-12: 117 pg/mL — ABNORMAL LOW (ref 180–914)

## 2023-03-15 LAB — CBG MONITORING, ED: Glucose-Capillary: 196 mg/dL — ABNORMAL HIGH (ref 70–99)

## 2023-03-15 LAB — GLUCOSE, CAPILLARY: Glucose-Capillary: 111 mg/dL — ABNORMAL HIGH (ref 70–99)

## 2023-03-15 LAB — TSH: TSH: 1.086 u[IU]/mL (ref 0.350–4.500)

## 2023-03-15 LAB — ETHANOL: Alcohol, Ethyl (B): <10 mg/dL (ref <10)

## 2023-03-15 LAB — APTT: aPTT: 31 s (ref 24–36)

## 2023-03-15 LAB — PROCALCITONIN: Procalcitonin: <0.1 ng/mL

## 2023-03-15 MED ORDER — ACETAMINOPHEN 650 MG RE SUPP
650.0000 mg | Freq: Once | RECTAL | Status: AC
  Administered 2023-03-15: 650 mg via RECTAL
  Filled 2023-03-15: qty 1

## 2023-03-15 MED ORDER — STROKE: EARLY STAGES OF RECOVERY BOOK
Freq: Once | Status: AC
  Filled 2023-03-15: qty 1

## 2023-03-15 MED ORDER — SODIUM CHLORIDE 0.9 % IV SOLN: INTRAVENOUS | Status: DC

## 2023-03-15 MED ORDER — LEVETIRACETAM 250 MG PO TABS: 250.0000 mg | ORAL_TABLET | Freq: Two times a day (BID) | ORAL | Status: DC

## 2023-03-15 MED ORDER — ASPIRIN 325 MG PO TABS
325.0000 mg | ORAL_TABLET | Freq: Every day | ORAL | Status: DC
  Administered 2023-03-17: 325 mg via ORAL
  Filled 2023-03-15: qty 1

## 2023-03-15 MED ORDER — LORAZEPAM 2 MG/ML IJ SOLN
2.0000 mg | Freq: Once | INTRAMUSCULAR | Status: AC
  Administered 2023-03-15: 2 mg via INTRAVENOUS

## 2023-03-15 MED ORDER — ENOXAPARIN SODIUM 40 MG/0.4ML IJ SOSY
40.0000 mg | PREFILLED_SYRINGE | INTRAMUSCULAR | Status: DC
  Administered 2023-03-15 – 2023-03-19 (×5): 40 mg via SUBCUTANEOUS
  Filled 2023-03-15 (×5): qty 0.4

## 2023-03-15 MED ORDER — VANCOMYCIN HCL 750 MG/150ML IV SOLN
750.0000 mg | Freq: Two times a day (BID) | INTRAVENOUS | Status: DC
  Administered 2023-03-16 – 2023-03-17 (×4): 750 mg via INTRAVENOUS
  Filled 2023-03-15 (×4): qty 150

## 2023-03-15 MED ORDER — ACETAMINOPHEN 160 MG/5ML PO SOLN: 650.0000 mg | ORAL | Status: DC | PRN

## 2023-03-15 MED ORDER — LORAZEPAM 2 MG/ML IJ SOLN
INTRAMUSCULAR | Status: AC
  Filled 2023-03-15: qty 1

## 2023-03-15 MED ORDER — VANCOMYCIN HCL 1250 MG/250ML IV SOLN
1250.0000 mg | Freq: Once | INTRAVENOUS | Status: AC
  Administered 2023-03-15: 1250 mg via INTRAVENOUS
  Filled 2023-03-15: qty 250

## 2023-03-15 MED ORDER — DEXTROSE 5 % IV SOLN
10.0000 mg/kg | Freq: Three times a day (TID) | INTRAVENOUS | Status: DC
  Administered 2023-03-15 – 2023-03-17 (×5): 575 mg via INTRAVENOUS
  Filled 2023-03-15 (×7): qty 11.5

## 2023-03-15 MED ORDER — SODIUM CHLORIDE 0.9 % IV SOLN
2.0000 g | Freq: Three times a day (TID) | INTRAVENOUS | Status: DC
  Administered 2023-03-15 – 2023-03-18 (×8): 2 g via INTRAVENOUS
  Filled 2023-03-15 (×10): qty 40

## 2023-03-15 MED ORDER — IOHEXOL 350 MG/ML SOLN
75.0000 mL | Freq: Once | INTRAVENOUS | Status: AC | PRN
  Administered 2023-03-15: 75 mL via INTRAVENOUS

## 2023-03-15 MED ORDER — INSULIN ASPART 100 UNIT/ML IJ SOLN
0.0000 [IU] | INTRAMUSCULAR | Status: DC
  Administered 2023-03-17 – 2023-03-18 (×4): 2 [IU] via SUBCUTANEOUS
  Administered 2023-03-18: 1 [IU] via SUBCUTANEOUS
  Administered 2023-03-18: 5 [IU] via SUBCUTANEOUS
  Administered 2023-03-18 – 2023-03-19 (×3): 1 [IU] via SUBCUTANEOUS
  Administered 2023-03-19 (×2): 2 [IU] via SUBCUTANEOUS
  Administered 2023-03-19: 3 [IU] via SUBCUTANEOUS
  Administered 2023-03-20: 1 [IU] via SUBCUTANEOUS
  Administered 2023-03-20: 3 [IU] via SUBCUTANEOUS

## 2023-03-15 MED ORDER — ACETAMINOPHEN 650 MG RE SUPP
650.0000 mg | RECTAL | Status: DC | PRN
  Filled 2023-03-15: qty 1

## 2023-03-15 MED ORDER — ASPIRIN 300 MG RE SUPP
300.0000 mg | Freq: Every day | RECTAL | Status: DC
  Administered 2023-03-15 – 2023-03-16 (×2): 300 mg via RECTAL
  Filled 2023-03-15 (×3): qty 1

## 2023-03-15 MED ORDER — SODIUM CHLORIDE 0.9 % IV SOLN
250.0000 mg | Freq: Two times a day (BID) | INTRAVENOUS | Status: DC
Start: 2023-03-15 — End: 2023-03-16
  Administered 2023-03-15 – 2023-03-16 (×2): 250 mg via INTRAVENOUS
  Filled 2023-03-15 (×4): qty 2.5

## 2023-03-15 MED ORDER — ACETAMINOPHEN 325 MG PO TABS
650.0000 mg | ORAL_TABLET | ORAL | Status: DC | PRN
  Administered 2023-03-15: 650 mg via ORAL

## 2023-03-15 NOTE — ED Provider Notes (Signed)
[place] EMERGENCY DEPARTMENT AT [facility] Provider Note   CSN: [id number] Arrival date & time: 03/15/23  0945     History  Chief Complaint  Patient presents with   Aphasia    [name] [name] is a 77 y.o. female, history of coronary artery disease, CHF, who presents to the ED secondary to difficulties with speech starting around 8:10 AM this morning.  Husband states that he got up, and she was stating that something was wrong with the coffee pot.  He fixed it, went to go take a shower, and he came back, and she was standing in the kitchen, and slurring her words.  This started around 810 today.  Last known normal was around 8:00 when the patient's husband got up.  Patient has been feeling well as of usual.  Patient denies any kind of weakness on one side of the body, no recent illnesses, or changes in vision.  No difficulty swallowing.  No dizziness.  Home Medications Prior to Admission medications   Medication Sig Start Date End Date Taking? Authorizing Provider  aspirin 81 MG chewable tablet Chew 1 tablet (81 mg total) by mouth 2 (two) times daily. 05/29/17   Swinteck, Arlys John, MD  atorvastatin (LIPITOR) 10 MG tablet TAKE 1 TABLET BY MOUTH EVERY DAY 04/22/22   Quintella Reichert, MD  Calcium Carb-Cholecalciferol (CALCIUM 600-D PO) Take 1 tablet by mouth daily.    [provider]  carvedilol (COREG) 6.25 MG tablet TAKE 1 TABLET BY MOUTH 2 TIMES DAILY WITH A MEAL. 07/22/22   Quintella Reichert, MD  clindamycin (CLEOCIN) 150 MG capsule Take 4 capsules by mouth as needed. (Take one (1) hour prior to dental procedures) 03/26/15   [provider]  colchicine 0.6 MG tablet Take 0.6 mg by mouth daily.    [provider]  fluticasone (FLONASE) 50 MCG/ACT nasal spray Place 1 spray into both nostrils 2 (two) times daily.    [provider]  glimepiride (AMARYL) 2 MG tablet Take 2 mg by mouth daily with breakfast.    [provider]   GLUCOSAMINE-CHONDROITIN PO Take 1 tablet by mouth daily. Glucosamine 1200 mg, chondroitin 1500 mg    [provider]  losartan (COZAAR) 25 MG tablet TAKE 1 TABLET (25 MG TOTAL) BY MOUTH DAILY. 09/16/22   Quintella Reichert, MD  metFORMIN (GLUCOPHAGE-XR) 500 MG 24 hr tablet Take 500 mg by mouth in the morning and at bedtime.     [provider]  Multiple Vitamin (MULTIVITAMIN WITH MINERALS) TABS tablet Take 1 tablet by mouth daily. Centrum Silver    [provider]  naproxen sodium (ALEVE) 220 MG tablet Take 440 mg by mouth daily as needed (headaches).    [provider]  ONE TOUCH ULTRA TEST test strip  05/11/17   [provider]  pramipexole (MIRAPEX) 0.125 MG tablet Take 0.125 mg by mouth at bedtime. 12/26/16   [provider]  ranitidine (ZANTAC) 150 MG tablet Take 150 mg by mouth daily as needed for heartburn.    [provider]      Allergies    Atenolol, Lisinopril, Penicillins, and Sulfa antibiotics    Review of Systems   Review of Systems  Neurological:  Positive for speech difficulty. Negative for dizziness, facial asymmetry and headaches.    Physical Exam Updated Vital Signs BP (!) 150/115 (BP Location: Right Arm)   Pulse 80   Temp 98.1 F (36.7 C)   Resp 16   Wt  74 kg   SpO2 100%   BMI 30.83 kg/m  Physical Exam Vitals and nursing note reviewed.  Constitutional:      General: She is not in acute distress.    Appearance: She is well-developed.  HENT:     Head: Normocephalic and atraumatic.  Eyes:     Conjunctiva/sclera: Conjunctivae normal.  Cardiovascular:     Rate and Rhythm: Normal rate and regular rhythm.     Heart sounds: No murmur heard. Pulmonary:     Effort: Pulmonary effort is normal. No respiratory distress.     Breath sounds: Normal breath sounds.  Abdominal:     Palpations: Abdomen is soft.     Tenderness: There is no abdominal tenderness.  Musculoskeletal:        General: No swelling.      Cervical back: Neck supple.  Skin:    General: Skin is warm and dry.     Capillary Refill: Capillary refill takes less than 2 seconds.  Neurological:     Mental Status: She is alert.     Comments: Patient with garbled speech.  No facial droop appreciated, difficulty following commands.  5 out of 5 strength of bilateral upper and lower extremities.  Good sensation.  Psychiatric:        Mood and Affect: Mood normal.     ED Results / Procedures / Treatments   Labs (all labs ordered are listed, but only abnormal results are displayed) Labs Reviewed  COMPREHENSIVE METABOLIC PANEL - Abnormal; Notable for the following components:      Result Value   Glucose, Bld 180 (*)    All other components within normal limits  CBG MONITORING, ED - Abnormal; Notable for the following components:   Glucose-Capillary 196 (*)    All other components within normal limits  ETHANOL  PROTIME-INR  APTT  CBC  DIFFERENTIAL  RAPID URINE DRUG SCREEN, HOSP PERFORMED  URINALYSIS, ROUTINE W REFLEX MICROSCOPIC  I-STAT CHEM 8, ED    EKG None  Radiology CT ANGIO HEAD NECK W WO CM (CODE STROKE)  Result Date: 03/15/2023 CLINICAL DATA:  77 year old female code stroke presentation. EXAM: CT ANGIOGRAPHY HEAD AND NECK WITH AND WITHOUT CONTRAST TECHNIQUE: Multidetector CT imaging of the head and neck was performed using the standard protocol during bolus administration of intravenous contrast. Multiplanar CT image reconstructions and MIPs were obtained to evaluate the vascular anatomy. Carotid stenosis measurements (when applicable) are obtained utilizing NASCET criteria, using the distal internal carotid diameter as the denominator. RADIATION DOSE REDUCTION: This exam was performed according to the departmental dose-optimization program which includes automated exposure control, adjustment of the mA and/or kV according to patient size and/or use of iterative reconstruction technique. CONTRAST:  75mL OMNIPAQUE IOHEXOL  350 MG/ML SOLN COMPARISON:  Head CT 1035 hours today. FINDINGS: CTA NECK Skeleton: Sternotomy. Cervical spine degeneration, mostly right side facet arthropathy. No acute osseous abnormality identified. Upper chest: Septal thickening, ground-glass opacity, dependent lung opacity. No apical pleural effusions. Negative visible mediastinum. Other neck: Negative. Aortic arch: Calcified aortic atherosclerosis.  3 vessel arch. Right carotid system: Brachiocephalic artery and proximal right CCA are tortuous with mild plaque, no stenosis. Mild calcified plaque at the right ICA origin and bulb without stenosis. Highly tortuous right ICA distal to the bulb. Left carotid system: Similar tortuosity. Low-density soft plaque at the ventral left CCA at the level of the larynx without stenosis on series 4, image 111. Otherwise mild proximal ICA plaque without stenosis. Tortuous left ICA distal to the  bulb. Vertebral arteries: Tortuous proximal right subclavian artery with soft and calcified plaque but no significant stenosis. Right vertebral artery origin remains normal. Tortuous right V1 segment and mildly late entry of the right vertebral into the transverse foramen. Right vertebral is patent to the skull base without stenosis. Tortuous and atherosclerotic proximal left subclavian artery with a kinked appearance at the thoracic inlet but no atherosclerotic stenosis. Left vertebral artery origin is tortuous without stenosis. Left vertebral artery appears mildly dominant and is patent to the skull base with no significant plaque or stenosis. CTA HEAD Posterior circulation: Distal vertebral arteries and vertebrobasilar junction are patent without stenosis. Normal PICA origins. Patent basilar artery without stenosis. Patent SCA and PCA origins. Posterior communicating arteries are diminutive or absent. Bilateral PCA branches are within normal limits, there is mild right PCA P2 segment irregularity. Anterior circulation: Both ICA  siphons are patent. Left siphon is heavily calcified with mild to moderate stenosis at the anterior genu. Right siphon is heavily calcified in the supraclinoid segment with mild-to-moderate stenosis. Patent carotid termini, MCA and ACA origins. Dominant left and diminutive right ACA A1 segments. Normal anterior communicating artery. Bilateral ACA branches are within normal limits. Left MCA M1 segment is patent without stenosis. Left MCA trifurcation is mildly ectatic, patent without stenosis. Left MCA branches are within normal limits. Right MCA M1 segment and bifurcation are patent without stenosis. Right MCA branches are within normal limits. Venous sinuses: Not evaluated due to early contrast timing. Anatomic variants: Dominant left ACA A1. Mildly dominant left vertebral artery. Review of the MIP images confirms the above findings IMPRESSION: 1. Negative for large vessel occlusion. 2. ICA siphon calcified atherosclerosis with up to Moderate bilateral siphon stenosis. 3. Extracranial generalized arterial tortuosity but generally mild extracranial atherosclerosis. No other hemodynamically significant arterial stenosis. Aortic Atherosclerosis (ICD10-I70.0). 4. Evidence of Pulmonary Edema in the visible upper chest. Electronically Signed   By: Odessa Fleming M.D.   On: 03/15/2023 11:54   CT HEAD CODE STROKE WO CONTRAST  Addendum Date: 03/15/2023   ADDENDUM REPORT: 03/15/2023 11:17 ADDENDUM: Study discussed by telephone with PA Katha Kuehne on 03/15/2023 at 1106 hours. Electronically Signed   By: Odessa Fleming M.D.   On: 03/15/2023 11:17   Result Date: 03/15/2023 CLINICAL DATA:  Code stroke. 77 year old female last known well 2200 hours. Slurred speech, aphasia. EXAM: CT HEAD WITHOUT CONTRAST TECHNIQUE: Contiguous axial images were obtained from the base of the skull through the vertex without intravenous contrast. RADIATION DOSE REDUCTION: This exam was performed according to the departmental dose-optimization program which  includes automated exposure control, adjustment of the mA and/or kV according to patient size and/or use of iterative reconstruction technique. COMPARISON:  None Available. FINDINGS: Brain: No midline shift, mass effect, or evidence of intracranial mass lesion. No ventriculomegaly. No acute intracranial hemorrhage identified. Cerebral volume is within normal limits for age. Patchy and confluent bilateral white matter hypodensity with some deep white matter involvement in both hemispheres. Deep gray nuclei relatively spared. No cortically based acute infarct identified. No cortical encephalomalacia identified. Vascular: Calcified atherosclerosis at the skull base. No suspicious intracranial vascular hyperdensity. Skull: No acute osseous abnormality identified. Sinuses/Orbits: Visualized paranasal sinuses and mastoids are clear. Other: No gaze deviation. Postoperative changes to the globes. Visualized scalp soft tissues are within normal limits. ASPECTS [facility] Stroke Program Early CT Score) Total score (0-10 with 10 being normal): 10 IMPRESSION: 1. No acute cortically based infarct or acute intracranial hemorrhage identified. ASPECTS 10. 2. Moderately advanced cerebral white  matter changes most commonly due to Shakila Mak vessel disease. Electronically Signed: By: Odessa Fleming M.D. On: 03/15/2023 11:04    Procedures Procedures    Medications Ordered in ED Medications  iohexol (OMNIPAQUE) 350 MG/ML injection 75 mL (75 mLs Intravenous Contrast Given 03/15/23 1137)    ED Course/ Medical Decision Making/ A&P                                 Medical Decision Making Patient is a 78 year old female, history of CAD, CHF, here for difficulty with speech starting around 810 today.  Husband states around 8 AM she was within normal limits, but around 810, she started having difficulty with speech, and dropped a piece of glass.  When asked to repeats phrases, patient has garbled speech, unintelligible responses.  Code stroke  initiated, informed nursing  Amount and/or Complexity of Data Reviewed Labs: ordered. Radiology: ordered.    Details: CT head shows no acute abnormality Discussion of management or test interpretation with external provider(s): Patient had code stroke called on them as last known normal was 810 or/symptom started around 810.  Spoke with Dr. Selina Cooley, she would like MRI, transfer to [facility] for this, no lytics at this time.  She states that symptoms have improved now.  Possibly TIA?,  Transferred to [facility], for MRI, and further neuro eval.  Excepted by Dr. Dalene Seltzer for transport  Risk Prescription drug management.   Final Clinical Impression(s) / ED Diagnoses Final diagnoses:  None    Rx / DC Orders ED Discharge Orders     None         Irvan Tiedt, [place], PA 03/15/23 1204    Anders Simmonds T, DO 03/15/23 1820

## 2023-03-15 NOTE — ED Notes (Signed)
Called EEG to notify that patient is moving upstairs, no answer at number provided.

## 2023-03-15 NOTE — Code Documentation (Signed)
Stroke Response Nurse Documentation Code Documentation  [name] [name] is a 77 y.o. female arriving to [facility] from [facility] ED for transient aphasia and confusion. Initial LKW 03-14-23 2200, though symptoms resolved, making LKW 1100. See prior notes for full details. Patient transferred to [facility] for stroke/TIA workup.   Upon arrival, word salad noted and taken immediately to MRI for possible treatment decision; CT/CTA already completed at [facility]. NIH 8, see flowsheet for details. Ativan 2mg  given 1357 due to agitation. Care Plan: Cancel as imaging negative. EEG ordered. Bedside handoff with ED RN Steward Drone.    Scarlette Slice K  Rapid Response RN

## 2023-03-15 NOTE — Progress Notes (Signed)
Pharmacy Antibiotic Note  [name] [name] is a 77 y.o. female admitted on 03/15/2023 with meningitis.  Pharmacy has been consulted for vanc/merrem/acyclovir dosing.  Pt admitted with aphasia. CT/MRI neg for CVA. Neuro ordered vanc/ceftriaxone/amp/acyclovir to r/o meningitis/encephalitis. Due to her PCN allergy listed as rash, we will use vanc/merrem/acyclovir instead.  Scr 0.7  Plan: Vanc 1.25g IV x1 then 750mg  IV BID (VT 15-20) Merrem 2g IV q8 Acyclovir 575mg  (10mg /kg) IV q8 NS @50  ml/hr  Height: 5' 0.5" (153.7 cm) (historical) Weight: 74 kg (163 lb 2.2 oz) IBW/kg (Calculated) : 46.65  Temp (24hrs), Avg:100.1 F (37.8 C), Min:98.1 F (36.7 C), Max:102.2 F (39 C)  Recent Labs  Lab 03/15/23 1027 03/15/23 1842  WBC 9.9  --   CREATININE 0.74 0.70    Estimated Creatinine Clearance: 51.9 mL/min (by C-G formula based on SCr of 0.7 mg/dL).    Allergies  Allergen Reactions   Atenolol Shortness Of Breath and Other (See Comments)    Edema and Headache   Lisinopril Cough   Penicillins Rash and Other (See Comments)    Has patient had a PCN reaction causing immediate rash, facial/tongue/throat swelling, SOB or lightheadedness with hypotension: No Has patient had a PCN reaction causing severe rash involving mucus membranes or skin necrosis: No Has patient had a PCN reaction that required hospitalization No Has patient had a PCN reaction occurring within the last 10 years: No If all of the above answers are "NO", then may proceed with Cephalosporin use.G Benzathine: Local injection rash   Sulfa Antibiotics Hives and Rash    Antimicrobials this admission: 12/8 vanc>> 12/8 Merrem>> 12/8 acyclovir>>  Dose adjustments this admission:   Microbiology results: 12/8 blood>>   Ulyses Southward, PharmD, BCIDP, AAHIVP, CPP Infectious Disease Pharmacist 03/15/2023 8:11 PM

## 2023-03-15 NOTE — ED Notes (Signed)
Cleaned and changed pt and bedding

## 2023-03-15 NOTE — ED Notes (Addendum)
Pt to MRI with primary RN and neurologist after receiving report from [facility] and pt presented with expressive and receptive aphasia.
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
## 2023-03-15 NOTE — Consult Note (Incomplete)
NEUROLOGY CONSULTATION NOTE   Date of service: March 15, 2023 Patient Name: [name] [name] MRN:  [id number] DOB:  [birth year] Reason for consult: "***" Requesting physician: "***" _ _ _   _ __   _ __ _ _  __ __   _ __   __ _  History of Present Illness   [name] [name] is a 77 y.o. female with PMH significant for  has a past medical history of Adenomatous colon polyp, Aortic regurgitation, Arthritis, Cardiomyopathy (HCC), Carotid stenosis (08/17/2017), Chronic diastolic CHF (congestive heart failure), NYHA class 2 (HCC), Coronary artery disease (10/05/2013), Diabetes mellitus without complication (HCC), Dilated aortic root (HCC), GERD (gastroesophageal reflux disease), Gout, Hypercholesteremia, Hypertension, S/P Bio-Bentall aortic root replacement with bioprosthetic valve conduit (10/27/2013), S/P CABG x 1 (10/27/2013), and Thyroiditis (10/2009). who presents with  ***   ROS   Per HPI: all other systems reviewed and are negative  Past History   I have reviewed the following:  Past Medical History:  Diagnosis Date   Adenomatous colon polyp    Aortic regurgitation    severe by TEE s/p pericardia tissue AVR   Arthritis    Cardiomyopathy (HCC)    EF initally 25% but now normalized   Carotid stenosis 08/17/2017   1-39% bilateral by dopplers 2022   Chronic diastolic CHF (congestive heart failure), NYHA class 2 (HCC)    Coronary artery disease 10/05/2013   Severe mid LAD stenosis s/p 1 vessel CABG with LIMA to LAD   Diabetes mellitus without complication (HCC)    DX 20 YRS AGO.   TYPE 2   Dilated aortic root (HCC)    GERD (gastroesophageal reflux disease)    Tums prn   Gout    Hypercholesteremia    LDL goal < 70   Hypertension    S/P Bio-Bentall aortic root replacement with bioprosthetic valve conduit 10/27/2013   23 mm [facility] Ease bovine pericardial tissue valve and 26 mm Vascutek gelweave Val-Salva aortic graft   S/P CABG x 1 10/27/2013   LIMA to LAD   Thyroiditis  10/2009   lab and u/s-thyroid function normalized in 9/11   Past Surgical History:  Procedure Laterality Date   ABDOMINAL HYSTERECTOMY     AORTIC VALVE REPLACEMENT N/A 10/27/2013   Procedure: AORTIC VALVE REPLACEMENT (AVR);  Surgeon: Purcell Nails, MD;  Location: [facility] OR;  Service: Open Heart Surgery;  Laterality: N/A;   ASCENDING AORTIC ROOT REPLACEMENT N/A 10/27/2013   Procedure: ASCENDING AORTIC ROOT REPLACEMENT;  Surgeon: Purcell Nails, MD;  Location: [facility] OR;  Service: Open Heart Surgery;  Laterality: N/A;   BUNIONECTOMY     bilateral   CARDIAC CATHETERIZATION     CARDIAC VALVE REPLACEMENT     AORTIC VALVE   CARPAL TUNNEL RELEASE Right    CATARACT EXTRACTION Bilateral    COLONOSCOPY     COLONOSCOPY WITH PROPOFOL N/A 05/08/2015   Procedure: COLONOSCOPY WITH PROPOFOL;  Surgeon: Charolett Bumpers, MD;  Location: [facility] ENDOSCOPY;  Service: Endoscopy;  Laterality: N/A;   CORONARY ARTERY BYPASS GRAFT N/A 10/27/2013   Procedure: CORONARY ARTERY BYPASS GRAFTING (CABG) x 1 - LIMA to LAD;  Surgeon: Purcell Nails, MD;  Location: [facility] OR;  Service: Open Heart Surgery;  Laterality: N/A;   EYE SURGERY     BILATERAL CATARACTS   INTRAOPERATIVE TRANSESOPHAGEAL ECHOCARDIOGRAM N/A 10/27/2013   Procedure: INTRAOPERATIVE TRANSESOPHAGEAL ECHOCARDIOGRAM;  Surgeon: Purcell Nails, MD;  Location: [facility] OR;  Service: Open Heart Surgery;  Laterality: N/A;  JOINT REPLACEMENT     left total knee   KNEE ARTHROPLASTY Right 01/26/2017   Procedure: RIGHT TOTAL KNEE ARTHROPLASTY WITH COMPUTER NAVIGATION;  Surgeon: Samson Frederic, MD;  Location: [facility] OR;  Service: Orthopedics;  Laterality: Right;  Needs RNFA   KNEE ARTHROPLASTY Left 05/28/2017   Procedure: LEFT TOTAL KNEE ARTHROPLASTY WITH COMPUTER NAVIGATION;  Surgeon: Samson Frederic, MD;  Location: [facility] ORS;  Service: Orthopedics;  Laterality: Left;  Needs RNFA   LEFT AND RIGHT HEART CATHETERIZATION WITH CORONARY ANGIOGRAM N/A 10/05/2013   Procedure: LEFT AND RIGHT HEART  CATHETERIZATION WITH CORONARY ANGIOGRAM;  Surgeon: Micheline Chapman, MD;  Location: [facility] CATH LAB;  Service: Cardiovascular;  Laterality: N/A;   TEE WITHOUT CARDIOVERSION N/A 10/04/2013   Procedure: TRANSESOPHAGEAL ECHOCARDIOGRAM (TEE);  Surgeon: Quintella Reichert, MD;  Location: [facility] ENDOSCOPY;  Service: Cardiovascular;  Laterality: N/A;   tubular adenomatous polyp colonoscopy  12/31/2009   Family History  Problem Relation Age of Onset   Hypertension Mother    Diabetes Mother    Arthritis/Rheumatoid Mother    Heart attack Father    Stroke Sister    Cancer Brother    Social History   Socioeconomic History   Marital status: Married    Spouse name: Not on file   Number of children: Not on file   Years of education: Not on file   Highest education level: Not on file  Occupational History   Not on file  Tobacco Use   Smoking status: Never   Smokeless tobacco: Never  Vaping Use   Vaping status: Never Used  Substance and Sexual Activity   Alcohol use: No   Drug use: No   Sexual activity: Not on file  Other Topics Concern   Not on file  Social History Narrative   Not on file   Social Determinants of Health   Financial Resource Strain: Not on file  Food Insecurity: Not on file  Transportation Needs: Not on file  Physical Activity: Not on file  Stress: Not on file  Social Connections: Not on file   Allergies  Allergen Reactions   Atenolol Shortness Of Breath and Other (See Comments)    Edema and Headache   Lisinopril Cough   Penicillins Rash and Other (See Comments)    Has patient had a PCN reaction causing immediate rash, facial/tongue/throat swelling, SOB or lightheadedness with hypotension: No Has patient had a PCN reaction causing severe rash involving mucus membranes or skin necrosis: No Has patient had a PCN reaction that required hospitalization No Has patient had a PCN reaction occurring within the last 10 years: No If all of the above answers are "NO", then may  proceed with Cephalosporin use.G Benzathine: Local injection rash   Sulfa Antibiotics Hives and Rash    Medications   (Not in a hospital admission)     Current Facility-Administered Medications:    iohexol (OMNIPAQUE) 350 MG/ML injection 75 mL, 75 mL, Intravenous, Once PRN, Arletha Pili, DO  Current Outpatient Medications:    aspirin 81 MG chewable tablet, Chew 1 tablet (81 mg total) by mouth 2 (two) times daily., Disp: 60 tablet, Rfl: 1   atorvastatin (LIPITOR) 10 MG tablet, TAKE 1 TABLET BY MOUTH EVERY DAY, Disp: 90 tablet, Rfl: 3   Calcium Carb-Cholecalciferol (CALCIUM 600-D PO), Take 1 tablet by mouth daily., Disp: , Rfl:    carvedilol (COREG) 6.25 MG tablet, TAKE 1 TABLET BY MOUTH 2 TIMES DAILY WITH A MEAL., Disp: 180 tablet, Rfl: 3  clindamycin (CLEOCIN) 150 MG capsule, Take 4 capsules by mouth as needed. (Take one (1) hour prior to dental procedures), Disp: , Rfl: 2   colchicine 0.6 MG tablet, Take 0.6 mg by mouth daily., Disp: , Rfl:    fluticasone (FLONASE) 50 MCG/ACT nasal spray, Place 1 spray into both nostrils 2 (two) times daily., Disp: , Rfl:    glimepiride (AMARYL) 2 MG tablet, Take 2 mg by mouth daily with breakfast., Disp: , Rfl:    GLUCOSAMINE-CHONDROITIN PO, Take 1 tablet by mouth daily. Glucosamine 1200 mg, chondroitin 1500 mg, Disp: , Rfl:    losartan (COZAAR) 25 MG tablet, TAKE 1 TABLET (25 MG TOTAL) BY MOUTH DAILY., Disp: 90 tablet, Rfl: 2   metFORMIN (GLUCOPHAGE-XR) 500 MG 24 hr tablet, Take 500 mg by mouth in the morning and at bedtime. , Disp: , Rfl:    Multiple Vitamin (MULTIVITAMIN WITH MINERALS) TABS tablet, Take 1 tablet by mouth daily. Centrum Silver, Disp: , Rfl:    naproxen sodium (ALEVE) 220 MG tablet, Take 440 mg by mouth daily as needed (headaches)., Disp: , Rfl:    ONE TOUCH ULTRA TEST test strip, , Disp: , Rfl:    pramipexole (MIRAPEX) 0.125 MG tablet, Take 0.125 mg by mouth at bedtime., Disp: , Rfl: 6   ranitidine (ZANTAC) 150 MG tablet, Take  150 mg by mouth daily as needed for heartburn., Disp: , Rfl:   Vitals   Vitals:   03/15/23 0957 03/15/23 1003  BP: (!) 150/115   Pulse: 80   Resp: 16   Temp: 98.1 F (36.7 C)   SpO2: 100%   Weight:  74 kg     Body mass index is 30.83 kg/m.  Physical Exam   Physical Exam Gen: A&O x4, NAD HEENT: Atraumatic, normocephalic;mucous membranes moist; oropharynx clear, tongue without atrophy or fasciculations. Neck: Supple, trachea midline. Resp: CTAB, no w/r/r CV: RRR, no m/g/r; nml S1 and S2. 2+ symmetric peripheral pulses. Abd: soft/NT/ND; nabs x 4 quad Extrem: Nml bulk; no cyanosis, clubbing, or edema.  Neuro: *MS: A&O x4. Follows multi-step commands.  *Speech: fluid, nondysarthric, able to name and repeat *CN:    I: Deferred   II,III: PERRLA, VFF by confrontation, optic discs unable to be visualized 2/2 pupillary constriction   III,IV,VI: EOMI w/o nystagmus, no ptosis   V: Sensation intact from V1 to V3 to LT   VII: Eyelid closure was full.  Smile symmetric.   VIII: Hearing intact to voice   IX,X: Voice normal, palate elevates symmetrically    XI: SCM/trap 5/5 bilat   XII: Tongue protrudes midline, no atrophy or fasciculations   *Motor:   Normal bulk.  No tremor, rigidity or bradykinesia. No pronator drift.    Strength: Dlt Bic Tri WrE WrF FgS Gr HF KnF KnE PlF DoF    Left 5 5 5 5 5 5 5 5 5 5 5 5     Right 5 5 5 5 5 5 5 5 5 5 5 5     *Sensory: Intact to light touch, pinprick, temperature vibration throughout. Symmetric. Propioception intact bilat.  No double-simultaneous extinction.  *Coordination:  Finger-to-nose, heel-to-shin, rapid alternating motions were intact. *Reflexes:  2+ and symmetric throughout without clonus; toes down-going bilat *Gait: normal base, normal stride, normal turn. Negative Romberg.  NIHSS  1a Level of Conscious.: *** 1b LOC Questions: *** 1c LOC Commands: *** 2 Best Gaze: *** 3 Visual: *** 4 Facial Palsy: *** 5a Motor Arm - left:  *** 5b Motor Arm -  Right: *** 6a Motor Leg - Left: *** 6b Motor Leg - Right: *** 7 Limb Ataxia: *** 8 Sensory: *** 9 Best Language: *** 10 Dysarthria: *** 11 Extinct. and Inatten.: ***  TOTAL: ***   Premorbid mRS = ***   Labs   CBC: No results for input(s): "WBC", "NEUTROABS", "HGB", "HCT", "MCV", "PLT" in the last 168 hours.  Basic Metabolic Panel:  Lab Results  Component Value Date   NA 138 04/17/2022   K 4.5 04/17/2022   CO2 22 04/17/2022   GLUCOSE 179 (H) 04/17/2022   BUN 11 04/17/2022   CREATININE 0.74 04/17/2022   CALCIUM 9.1 04/17/2022   GFRNONAA >60 05/29/2017   GFRAA >60 05/29/2017   Lipid Panel: No results found for: "LDLCALC" HgbA1c:  Lab Results  Component Value Date   HGBA1C 6.8 (H) 10/24/2013   Urine Drug Screen: No results found for: "LABOPIA", "COCAINSCRNUR", "LABBENZ", "AMPHETMU", "THCU", "LABBARB"  Alcohol Level No results found for: "ETH"  CT Head without contrast: ***  CT angio Head and Neck with contrast: ***  MRI Brain ***  rEEG: ***  Impression   ***  Recommendations  *** ______________________________________________________________________   Thank you for the opportunity to take part in the care of this patient. If you have any further questions, please contact the neurology consultation attending.  Signed,  Bing Neighbors, MD [facility] [phone]  If 7pm- 7am, please page neurology on call as listed in AMION.  **Any copied and pasted documentation in this note was written by me in another application not billed for and pasted by me into this document.

## 2023-03-15 NOTE — ED Provider Notes (Addendum)
  Physical Exam  BP 106/84   Pulse 75   Temp 99.8 F (37.7 C) (Axillary)   Resp 20   Ht 5' 0.5" (1.537 m) Comment: historical  Wt 74 kg   SpO2 98%   BMI 31.34 kg/m   Physical Exam  Procedures  Procedures  ED Course / MDM    Medical Decision Making Risk OTC drugs. Decision regarding hospitalization.   Received in sign out.  TIA versus seizure activity.  Will need admission for EEG.  More confusion since Ativan for MRI.  MRI negative for stroke.       Benjiman Core, MD 03/15/23 [phone]   Patient has been admitted.  Developed fever.  Hospitalist aware.  Neurology later contacted me.  Worried for potential meningitis.  However patient is not calm enough to be able to do an LP right now.  Discussed with family.  Will discuss with hospitalist.   Benjiman Core, MD 03/15/23 [phone]

## 2023-03-15 NOTE — Progress Notes (Signed)
Telestroke cart was activated at 2025. Per treatment team, pt's LKW was at 0810 with c/o aphasia. Brooke, [place], EDP at bedside assessing patient prior to cart activation. Pt transported to CT at 1029 and returned to room at 1053. [facility] was paged for code stroke at 60. Dr. Dr. Selina Cooley, [facility] appeared on telestroke cart at 1031 to assess the patient. Based on [facility]'s assessment, pt does not meet criteria for emergent interventions at this time 2/2 acute neurologic deficits resolving. [facility] to f/u with EDP regarding recommendations. No further needs from Telestroke RN at this time. Telestroke cart disconnected at 1108.   mRS-2

## 2023-03-15 NOTE — ED Triage Notes (Signed)
Pt was last known well 1000 pm. Went to bed with husband. This am around 0800 she had gotten up to start coffee, but husband noticed she never put water in the coffee pot. Then she dropped a plate. He looked at her and noticed her speech was slurred.

## 2023-03-15 NOTE — ED Notes (Signed)
PT here from [facility] for stroke.  LSN 0815.  R sided facial droop.  Symptoms are intermittent.  Met by neurology upon arrival.

## 2023-03-15 NOTE — Consult Note (Signed)
NEUROLOGY TELECONSULTATION NOTE   Date of service: March 15, 2023 Patient Name: [name] [name] MRN:  [id number] DOB:  1945/07/24 Reason for consult: telestroke  Requesting Provider: Dr. Andria Meuse Consult Participants: myself, patient, patient's husband, bedside RN, telestroke RN Location of the provider: [facility] Location of the patient: [facility]  This consult was provided via telemedicine with 2-way video and audio communication. The patient/family was informed that care would be provided in this way and agreed to receive care in this manner.   _ _ _   _ __   _ __ _ _  __ __   _ __   __ _  History of Present Illness   This is a 77 year old woman with a past medical history of cardiomyopathy, carotid stenosis, chronic diastolic heart failure, CAD, DM2, hyperlipidemia, hypertension, status post bio Bentall aortic root replacement with bioprosthetic valve conduit, status post CABG x 1 in 2015 who presents after transient episode of aphasia and confusion this morning.  Last known well was initially thought to be 10:00 PM last night when her husband last had a normal interaction with her.  This morning she woke up before him and was making coffee and was confused about how to do it.  She came and told him that the coffee machine was broken but she just had not put water in it.  At this point he noticed that her speech was slurred and that she was mixing up some of her words.  She also dropped a spoon out of her right hand.  Her symptoms resolved and she was completely back to baseline but then they recurred and he called EMS.  In the EDP initial evaluation she was having word substitutions in the moderate aphasia, on my initial exam her aphasia was mild and resolved back to baseline over the course of my exam.  Head CT showed no acute process on personal review.  NIH stroke scale was 1 by the end of the call for chronic left leg weakness and drift on exam.  TNK was not administered due to acute neurologic  deficits resolving.   ROS   Per HPI; all other systems reviewed and are negative  Past History   The following was personally reviewed:  Past Medical History:  Diagnosis Date   Adenomatous colon polyp    Aortic regurgitation    severe by TEE s/p pericardia tissue AVR   Arthritis    Cardiomyopathy (HCC)    EF initally 25% but now normalized   Carotid stenosis 08/17/2017   1-39% bilateral by dopplers 2022   Chronic diastolic CHF (congestive heart failure), NYHA class 2 (HCC)    Coronary artery disease 10/05/2013   Severe mid LAD stenosis s/p 1 vessel CABG with LIMA to LAD   Diabetes mellitus without complication (HCC)    DX 20 YRS AGO.   TYPE 2   Dilated aortic root (HCC)    GERD (gastroesophageal reflux disease)    Tums prn   Gout    Hypercholesteremia    LDL goal < 70   Hypertension    S/P Bio-Bentall aortic root replacement with bioprosthetic valve conduit 10/27/2013   23 mm [facility] Ease bovine pericardial tissue valve and 26 mm Vascutek gelweave Val-Salva aortic graft   S/P CABG x 1 10/27/2013   LIMA to LAD   Thyroiditis 10/2009   lab and u/s-thyroid function normalized in 9/11   Past Surgical History:  Procedure Laterality Date   ABDOMINAL HYSTERECTOMY  AORTIC VALVE REPLACEMENT N/A 10/27/2013   Procedure: AORTIC VALVE REPLACEMENT (AVR);  Surgeon: Purcell Nails, MD;  Location: [facility] OR;  Service: Open Heart Surgery;  Laterality: N/A;   ASCENDING AORTIC ROOT REPLACEMENT N/A 10/27/2013   Procedure: ASCENDING AORTIC ROOT REPLACEMENT;  Surgeon: Purcell Nails, MD;  Location: [facility] OR;  Service: Open Heart Surgery;  Laterality: N/A;   BUNIONECTOMY     bilateral   CARDIAC CATHETERIZATION     CARDIAC VALVE REPLACEMENT     AORTIC VALVE   CARPAL TUNNEL RELEASE Right    CATARACT EXTRACTION Bilateral    COLONOSCOPY     COLONOSCOPY WITH PROPOFOL N/A 05/08/2015   Procedure: COLONOSCOPY WITH PROPOFOL;  Surgeon: Charolett Bumpers, MD;  Location: [facility] ENDOSCOPY;  Service:  Endoscopy;  Laterality: N/A;   CORONARY ARTERY BYPASS GRAFT N/A 10/27/2013   Procedure: CORONARY ARTERY BYPASS GRAFTING (CABG) x 1 - LIMA to LAD;  Surgeon: Purcell Nails, MD;  Location: [facility] OR;  Service: Open Heart Surgery;  Laterality: N/A;   EYE SURGERY     BILATERAL CATARACTS   INTRAOPERATIVE TRANSESOPHAGEAL ECHOCARDIOGRAM N/A 10/27/2013   Procedure: INTRAOPERATIVE TRANSESOPHAGEAL ECHOCARDIOGRAM;  Surgeon: Purcell Nails, MD;  Location: [facility] OR;  Service: Open Heart Surgery;  Laterality: N/A;   JOINT REPLACEMENT     left total knee   KNEE ARTHROPLASTY Right 01/26/2017   Procedure: RIGHT TOTAL KNEE ARTHROPLASTY WITH COMPUTER NAVIGATION;  Surgeon: Samson Frederic, MD;  Location: [facility] OR;  Service: Orthopedics;  Laterality: Right;  Needs RNFA   KNEE ARTHROPLASTY Left 05/28/2017   Procedure: LEFT TOTAL KNEE ARTHROPLASTY WITH COMPUTER NAVIGATION;  Surgeon: Samson Frederic, MD;  Location: [facility] ORS;  Service: Orthopedics;  Laterality: Left;  Needs RNFA   LEFT AND RIGHT HEART CATHETERIZATION WITH CORONARY ANGIOGRAM N/A 10/05/2013   Procedure: LEFT AND RIGHT HEART CATHETERIZATION WITH CORONARY ANGIOGRAM;  Surgeon: Micheline Chapman, MD;  Location: [facility] CATH LAB;  Service: Cardiovascular;  Laterality: N/A;   TEE WITHOUT CARDIOVERSION N/A 10/04/2013   Procedure: TRANSESOPHAGEAL ECHOCARDIOGRAM (TEE);  Surgeon: Quintella Reichert, MD;  Location: [facility] ENDOSCOPY;  Service: Cardiovascular;  Laterality: N/A;   tubular adenomatous polyp colonoscopy  12/31/2009   Family History  Problem Relation Age of Onset   Hypertension Mother    Diabetes Mother    Arthritis/Rheumatoid Mother    Heart attack Father    Stroke Sister    Cancer Brother    Social History   Socioeconomic History   Marital status: Married    Spouse name: Not on file   Number of children: Not on file   Years of education: Not on file   Highest education level: Not on file  Occupational History   Not on file  Tobacco Use   Smoking status: Never    Smokeless tobacco: Never  Vaping Use   Vaping status: Never Used  Substance and Sexual Activity   Alcohol use: No   Drug use: No   Sexual activity: Not on file  Other Topics Concern   Not on file  Social History Narrative   Not on file   Social Determinants of Health   Financial Resource Strain: Not on file  Food Insecurity: Not on file  Transportation Needs: Not on file  Physical Activity: Not on file  Stress: Not on file  Social Connections: Not on file   Allergies  Allergen Reactions   Atenolol Shortness Of Breath and Other (See Comments)    Edema and Headache   Lisinopril Cough  Penicillins Rash and Other (See Comments)    Has patient had a PCN reaction causing immediate rash, facial/tongue/throat swelling, SOB or lightheadedness with hypotension: No Has patient had a PCN reaction causing severe rash involving mucus membranes or skin necrosis: No Has patient had a PCN reaction that required hospitalization No Has patient had a PCN reaction occurring within the last 10 years: No If all of the above answers are "NO", then may proceed with Cephalosporin use.G Benzathine: Local injection rash   Sulfa Antibiotics Hives and Rash    Medications   (Not in a hospital admission)    No current facility-administered medications for this encounter.  Current Outpatient Medications:    aspirin 81 MG chewable tablet, Chew 1 tablet (81 mg total) by mouth 2 (two) times daily., Disp: 60 tablet, Rfl: 1   atorvastatin (LIPITOR) 10 MG tablet, TAKE 1 TABLET BY MOUTH EVERY DAY, Disp: 90 tablet, Rfl: 3   Calcium Carb-Cholecalciferol (CALCIUM 600-D PO), Take 1 tablet by mouth daily., Disp: , Rfl:    carvedilol (COREG) 6.25 MG tablet, TAKE 1 TABLET BY MOUTH 2 TIMES DAILY WITH A MEAL., Disp: 180 tablet, Rfl: 3   clindamycin (CLEOCIN) 150 MG capsule, Take 4 capsules by mouth as needed. (Take one (1) hour prior to dental procedures), Disp: , Rfl: 2   colchicine 0.6 MG tablet, Take 0.6 mg by  mouth daily., Disp: , Rfl:    fluticasone (FLONASE) 50 MCG/ACT nasal spray, Place 1 spray into both nostrils 2 (two) times daily., Disp: , Rfl:    glimepiride (AMARYL) 2 MG tablet, Take 2 mg by mouth daily with breakfast., Disp: , Rfl:    GLUCOSAMINE-CHONDROITIN PO, Take 1 tablet by mouth daily. Glucosamine 1200 mg, chondroitin 1500 mg, Disp: , Rfl:    losartan (COZAAR) 25 MG tablet, TAKE 1 TABLET (25 MG TOTAL) BY MOUTH DAILY., Disp: 90 tablet, Rfl: 2   metFORMIN (GLUCOPHAGE-XR) 500 MG 24 hr tablet, Take 500 mg by mouth in the morning and at bedtime. , Disp: , Rfl:    Multiple Vitamin (MULTIVITAMIN WITH MINERALS) TABS tablet, Take 1 tablet by mouth daily. Centrum Silver, Disp: , Rfl:    naproxen sodium (ALEVE) 220 MG tablet, Take 440 mg by mouth daily as needed (headaches)., Disp: , Rfl:    ONE TOUCH ULTRA TEST test strip, , Disp: , Rfl:    pramipexole (MIRAPEX) 0.125 MG tablet, Take 0.125 mg by mouth at bedtime., Disp: , Rfl: 6   ranitidine (ZANTAC) 150 MG tablet, Take 150 mg by mouth daily as needed for heartburn., Disp: , Rfl:   Vitals   Vitals:   03/15/23 0957 03/15/23 1003 03/15/23 1130  BP: (!) 150/115  (!) 160/75  Pulse: 80  68  Resp: 16  16  Temp: 98.1 F (36.7 C)    SpO2: 100%  100%  Weight:  74 kg      Body mass index is 30.83 kg/m.  Physical Exam   Exam performed over telemedicine with 2-way video and audio communication and with assistance of bedside RN  Physical Exam Gen: A&O x4, NAD Resp: normal WOB CV: extremities appear well-perfused  Neuro: *MS: A&O x4. Follows multi-step commands.  *Speech: nondysarthric, no aphasia, able to name and repeat *CN: PERRL 3mm, EOMI, VFF by confrontation, sensation intact, smile symmetric, hearing intact to voice *Motor:   Normal bulk.  No tremor, rigidity or bradykinesia. No pronator drift. All extremities appear full-strength and symmetric except drift (but not to bed) in LLE which patient  states is chronic. *Sensory: SILT.  Symmetric. No double-simultaneous extinction.  *Coordination:  Finger-to-nose, heel-to-shin, rapid alternating motions were intact. *Reflexes:  UTA 2/2 tele-exam *Gait: deferred  NIHSS = 1 for LLE drift  Premorbid mRS = 2   Labs   CBC:  Recent Labs  Lab 03/15/23 1027  WBC 9.9  NEUTROABS 6.4  HGB 12.8  HCT 37.5  MCV 95.2  PLT 199    Basic Metabolic Panel:  Lab Results  Component Value Date   NA 135 03/15/2023   K 4.2 03/15/2023   CO2 27 03/15/2023   GLUCOSE 180 (H) 03/15/2023   BUN 13 03/15/2023   CREATININE 0.74 03/15/2023   CALCIUM 10.0 03/15/2023   GFRNONAA >60 03/15/2023   GFRAA >60 05/29/2017   Lipid Panel: No results found for: "LDLCALC" HgbA1c:  Lab Results  Component Value Date   HGBA1C 6.8 (H) 10/24/2013   Urine Drug Screen: No results found for: "LABOPIA", "COCAINSCRNUR", "LABBENZ", "AMPHETMU", "THCU", "LABBARB"  Alcohol Level     Component Value Date/Time   ETH <10 03/15/2023 1027    CT Head without contrast: 1. No acute cortically based infarct or acute intracranial hemorrhage identified. ASPECTS 10. 2. Moderately advanced cerebral white matter changes most commonly due to small vessel disease.  CT angio Head and Neck with contrast:  1. Negative for large vessel occlusion.   2. ICA siphon calcified atherosclerosis with up to Moderate bilateral siphon stenosis.   3. Extracranial generalized arterial tortuosity but generally mild extracranial atherosclerosis. No other hemodynamically significant arterial stenosis. Aortic Atherosclerosis (ICD10-I70.0).   4. Evidence of Pulmonary Edema in the visible upper chest.  CNS imaging personally reviewed, agree with above interpretations  Impression   This is a 77 year old woman with a past medical history of cardiomyopathy, carotid stenosis, chronic diastolic heart failure, CAD, DM2, hyperlipidemia, hypertension, status post bio Bentall aortic root replacement with bioprosthetic valve conduit,  status post CABG x 1 in 2015 presents with 2 episodes acute aphasia and dysarthria today, now back to baseline, c/f TIA vs small ischemic infarct.  Recommendations   - Admit for stroke/TIA workup to  - Permissive HTN x48 hrs from sx onset or until stroke ruled out by MRI goal BP <220/120. PRN labetalol or hydralazine if BP above these parameters. Avoid oral antihypertensives. - MRI brain wo contrast - TTE - Check A1c and LDL + add statin per guidelines - ASA 81mg  daily + plavix 75mg  daily x21 days f/b plavix 75mg  daily monotherapy after that - q4 hr neuro checks - STAT head CT for any change in neuro exam - Tele - PT/OT/SLP - Stroke education - Amb referral to neurology upon discharge   Please notify neurology upon patient's arrival to [facility]  ______________________________________________________________________   Thank you for the opportunity to take part in the care of this patient. If you have any further questions, please contact the neurology consultation attending.  Signed,  Bing Neighbors, MD [facility] [phone]  If 7pm- 7am, please page neurology on call as listed in AMION.  **Any copied and pasted documentation in this note was written by me in another application not billed for and pasted by me into this document.

## 2023-03-15 NOTE — ED Notes (Signed)
MD notified of elevated temp

## 2023-03-15 NOTE — ED Notes (Signed)
[name] [name] with cl called for [name] [name] to ed transport

## 2023-03-15 NOTE — ED Notes (Signed)
Pt to MRI with RN

## 2023-03-15 NOTE — Progress Notes (Signed)
LTM EEG hooked up and running - no initial skin breakdown - push button tested - [facility] monitoring.  

## 2023-03-15 NOTE — ED Triage Notes (Incomplete)
Pt with word finding difficulty in triage

## 2023-03-15 NOTE — H&P (Addendum)
History and Physical  Patient: [name] [name] ZOX:[id number] DOB: 1945-11-25 DOA: 03/15/2023 DOS: the patient was seen and examined on 03/15/2023 Patient coming from: Home  Chief Complaint:  Chief Complaint  Patient presents with   Aphasia   HPI: [name] [name] is a 77 y.o. female with PMH significant of coronary artery disease, chronic diastolic CHF, diabetes mellitus 2, HTN, hyperlipidemia AR s/p bioprosthetic valve repair, CABG in 2015 presented to ED with sudden confusion and aphasia this morning.  History was obtained from patient's husband and daughter at the bedside.  Per husband, she was in her normal state of health until last night until 10 PM.  This morning, around 8:15 am, he noted that she was confused, was trying to make coffee however had not put the water in the coffee machine.  He noted that her speech was slurred and then dropped as 4 out of 5 right hand.  She was also noted to have right facial droop. She was seen at [facility] ED, neurology, Dr. Selina Cooley was consulted and recommended stroke workup. At the time of my examination, patient had received Ativan,  was somnolent and unable to participate in the exam or history.  Family noted that she has been having forgetfulness and memory issues for the last 6-9 months.   She also has history of frequent UTIs.  Per family, no recent fevers, cough, chest pain or shortness of breath.  No nausea vomiting or abdominal pain.  ED course:  In ED, temp 98.1 (8, RR 16, pulse 80, BP 150/115, O2 sats 100% on room air  MRI brain negative for acute infarct CT angiogram head and neck negative for large vessel occlusion, ICA siphon calcified atherosclerosis with moderate bilateral siphon stenosis. Patient was evaluated by neurology, Dr Iver Nestle, likely focal seizures as the etiology of patient's symptoms.  Patient was admitted for further workup  Review of Systems: unable to review all systems due to the inability of the patient to answer  questions. Past Medical History:  Diagnosis Date   Adenomatous colon polyp    Aortic regurgitation    severe by TEE s/p pericardia tissue AVR   Arthritis    Cardiomyopathy (HCC)    EF initally 25% but now normalized   Carotid stenosis 08/17/2017   1-39% bilateral by dopplers 2022   Chronic diastolic CHF (congestive heart failure), NYHA class 2 (HCC)    Coronary artery disease 10/05/2013   Severe mid LAD stenosis s/p 1 vessel CABG with LIMA to LAD   Diabetes mellitus without complication (HCC)    DX 20 YRS AGO.   TYPE 2   Dilated aortic root (HCC)    GERD (gastroesophageal reflux disease)    Tums prn   Gout    Hypercholesteremia    LDL goal < 70   Hypertension    S/P Bio-Bentall aortic root replacement with bioprosthetic valve conduit 10/27/2013   23 mm [facility] Ease bovine pericardial tissue valve and 26 mm Vascutek gelweave Val-Salva aortic graft   S/P CABG x 1 10/27/2013   LIMA to LAD   Thyroiditis 10/2009   lab and u/s-thyroid function normalized in 9/11   Past Surgical History:  Procedure Laterality Date   ABDOMINAL HYSTERECTOMY     AORTIC VALVE REPLACEMENT N/A 10/27/2013   Procedure: AORTIC VALVE REPLACEMENT (AVR);  Surgeon: Purcell Nails, MD;  Location: [facility] OR;  Service: Open Heart Surgery;  Laterality: N/A;   ASCENDING AORTIC ROOT REPLACEMENT N/A 10/27/2013   Procedure: ASCENDING AORTIC ROOT REPLACEMENT;  Surgeon: Purcell Nails, MD;  Location: [facility] OR;  Service: Open Heart Surgery;  Laterality: N/A;   BUNIONECTOMY     bilateral   CARDIAC CATHETERIZATION     CARDIAC VALVE REPLACEMENT     AORTIC VALVE   CARPAL TUNNEL RELEASE Right    CATARACT EXTRACTION Bilateral    COLONOSCOPY     COLONOSCOPY WITH PROPOFOL N/A 05/08/2015   Procedure: COLONOSCOPY WITH PROPOFOL;  Surgeon: Charolett Bumpers, MD;  Location: [facility] ENDOSCOPY;  Service: Endoscopy;  Laterality: N/A;   CORONARY ARTERY BYPASS GRAFT N/A 10/27/2013   Procedure: CORONARY ARTERY BYPASS GRAFTING (CABG) x 1 - LIMA to  LAD;  Surgeon: Purcell Nails, MD;  Location: [facility] OR;  Service: Open Heart Surgery;  Laterality: N/A;   EYE SURGERY     BILATERAL CATARACTS   INTRAOPERATIVE TRANSESOPHAGEAL ECHOCARDIOGRAM N/A 10/27/2013   Procedure: INTRAOPERATIVE TRANSESOPHAGEAL ECHOCARDIOGRAM;  Surgeon: Purcell Nails, MD;  Location: [facility] OR;  Service: Open Heart Surgery;  Laterality: N/A;   JOINT REPLACEMENT     left total knee   KNEE ARTHROPLASTY Right 01/26/2017   Procedure: RIGHT TOTAL KNEE ARTHROPLASTY WITH COMPUTER NAVIGATION;  Surgeon: Samson Frederic, MD;  Location: [facility] OR;  Service: Orthopedics;  Laterality: Right;  Needs RNFA   KNEE ARTHROPLASTY Left 05/28/2017   Procedure: LEFT TOTAL KNEE ARTHROPLASTY WITH COMPUTER NAVIGATION;  Surgeon: Samson Frederic, MD;  Location: [facility] ORS;  Service: Orthopedics;  Laterality: Left;  Needs RNFA   LEFT AND RIGHT HEART CATHETERIZATION WITH CORONARY ANGIOGRAM N/A 10/05/2013   Procedure: LEFT AND RIGHT HEART CATHETERIZATION WITH CORONARY ANGIOGRAM;  Surgeon: Micheline Chapman, MD;  Location: [facility] CATH LAB;  Service: Cardiovascular;  Laterality: N/A;   TEE WITHOUT CARDIOVERSION N/A 10/04/2013   Procedure: TRANSESOPHAGEAL ECHOCARDIOGRAM (TEE);  Surgeon: Quintella Reichert, MD;  Location: [facility] ENDOSCOPY;  Service: Cardiovascular;  Laterality: N/A;   tubular adenomatous polyp colonoscopy  12/31/2009   Social History:  reports that she has never smoked. She has never used smokeless tobacco. She reports that she does not drink alcohol and does not use drugs. Allergies  Allergen Reactions   Atenolol Shortness Of Breath and Other (See Comments)    Edema and Headache   Lisinopril Cough   Penicillins Rash and Other (See Comments)    Has patient had a PCN reaction causing immediate rash, facial/tongue/throat swelling, SOB or lightheadedness with hypotension: No Has patient had a PCN reaction causing severe rash involving mucus membranes or skin necrosis: No Has patient had a PCN reaction that required  hospitalization No Has patient had a PCN reaction occurring within the last 10 years: No If all of the above answers are "NO", then may proceed with Cephalosporin use.G Benzathine: Local injection rash   Sulfa Antibiotics Hives and Rash   Family History  Problem Relation Age of Onset   Hypertension Mother    Diabetes Mother    Arthritis/Rheumatoid Mother    Heart attack Father    Stroke Sister    Cancer Brother    Prior to Admission medications   Medication Sig Start Date End Date Taking? Authorizing Provider  aspirin 81 MG chewable tablet Chew 1 tablet (81 mg total) by mouth 2 (two) times daily. 05/29/17  Yes Swinteck, Arlys John, MD  atorvastatin (LIPITOR) 10 MG tablet TAKE 1 TABLET BY MOUTH EVERY DAY 04/22/22  Yes Turner, Cornelious Bryant, MD  Calcium Carb-Cholecalciferol (CALCIUM 600-D PO) Take 1 tablet by mouth daily.   Yes [provider]  carvedilol (COREG) 6.25 MG tablet  TAKE 1 TABLET BY MOUTH 2 TIMES DAILY WITH A MEAL. 07/22/22  Yes Turner, Cornelious Bryant, MD  clindamycin (CLEOCIN) 150 MG capsule Take 4 capsules by mouth as needed. (Take one (1) hour prior to dental procedures) 03/26/15  Yes [provider]  colchicine 0.6 MG tablet Take 0.6 mg by mouth daily.   Yes [provider]  glimepiride (AMARYL) 2 MG tablet Take 2 mg by mouth daily with breakfast.   Yes [provider]  GLUCOSAMINE-CHONDROITIN PO Take 1 tablet by mouth daily. Glucosamine 1200 mg, chondroitin 1500 mg   Yes [provider]  losartan (COZAAR) 25 MG tablet TAKE 1 TABLET (25 MG TOTAL) BY MOUTH DAILY. 09/16/22  Yes Turner, Cornelious Bryant, MD  meloxicam (MOBIC) 7.5 MG tablet Take 7.5 mg by mouth daily as needed for pain. 11/17/22  Yes [provider]  metFORMIN (GLUCOPHAGE-XR) 500 MG 24 hr tablet Take 1,000 mg by mouth in the morning and at bedtime.   Yes [provider]  Multiple Vitamin (MULTIVITAMIN WITH MINERALS) TABS tablet Take 1 tablet by mouth daily. Centrum Silver   Yes  [provider]  naproxen sodium (ALEVE) 220 MG tablet Take 440 mg by mouth daily as needed (headaches).   Yes [provider]  omeprazole (PRILOSEC) 40 MG capsule Take 40 mg by mouth daily.   Yes [provider]  pramipexole (MIRAPEX) 1.5 MG tablet Take 1.5 mg by mouth at bedtime.   Yes [provider]  ranitidine (ZANTAC) 150 MG tablet Take 150 mg by mouth daily as needed for heartburn.   Yes [provider]  ONE TOUCH ULTRA TEST test strip  05/11/17   [provider]   Physical Exam: Vitals:   03/15/23 1003 03/15/23 1130 03/15/23 1440 03/15/23 1456  BP:  (!) 160/75 106/84   Pulse:  68 75   Resp:  16 20   Temp:   99.8 F (37.7 C)   TempSrc:   Axillary   SpO2:  100% 98%   Weight: 74 kg     Height:    5' 0.5" (1.537 m)     General: somnolent, not following verbal commands Eyes: pink conjunctiva, anicteric sclera, PERLA HEENT: normocephalic, atraumatic Neck: supple, no masses or lymphadenopathy, no JVD CVS: Regular rate and rhythm, no murmurs, rubs or gallops. Resp : Clear to auscultation bilaterally, no wheezing, rales or rhonchi. GI : Soft, nontender, nondistended, positive bowel sounds. No hepatomegaly.  Ext: No lower extremity edema  Musculoskeletal: No clubbing or cyanosis, positive pedal pulses. No contracture. ROM intact  Neuro: spontaneously moving extremities with not following verbal commands Psych: somnolent (due to ativan) Today echocardiogram Skin: no rashes or lesions, warm and dry   Data Reviewed: I have reviewed ED notes, Vitals, Lab results and outpatient records.   Recent Labs  Lab 03/15/23 1027  NA 135  K 4.2  CL 98  CO2 27  GLUCOSE 180*  BUN 13  CREATININE 0.74  CALCIUM 10.0   Recent Labs  Lab 03/15/23 1027  WBC 9.9  NEUTROABS 6.4  HGB 12.8  HCT 37.5  MCV 95.2  PLT 199    Assessment and Plan Principal Problem:   Seizure (HCC) versus TIA -Patient underwent stroke workup including  MRI brain, CTA head and neck, negative for acute infarct -Neurology recommendations appreciated, at this point possible etiology is a focal seizure -Placed on seizure precautions, fall precautions, aspiration precautions -Obtain EEG, placed on IV Keppra  -Will complete stroke workup as well, pending  2D echo, lipid panel, HbA1c -N.p.o. until alert and awake, placed on IV fluid hydration -Will r/o infectious process, has history of frequent UTIs, will check UA and culture, Chest x-ray -Spiked fever in the ED, obtain blood cultures, procalcitonin,  Active Problems:   Essential hypertension, benign -Hold antihypertensives, continue gentle hydration  Acute metabolic encephalopathy in the setting of possible dementia -Per patient's family, she has been having memory issues for last 6 to 9 months, ambulates with a cane -Obtain B1, B12, folate, TSH -Avoid Ativan -Spiked temp 102.2 F in ED, rule out infectious process, obtain respiratory virus panel, UA and culture, procalcitonin, blood cultures x2, baseline chest x-ray.     Coronary artery disease,  Chronic diastolic CHF (congestive heart failure), NYHA class 2 (HCC) -2D echo 04/28/2022 had shown EF 55 to 60%, G2 DD, mild aortic valve regurgitation, 23 mm valve present -No complaints of recent chest pain, shortness of breath or any cardiac symptoms     Dyslipidemia, goal LDL below 70 -Currently n.p.o., follow lipid panel   Diabetes Mellitus type II, NIDDM uncontrolled with hyperglycemia -Currently n.p.o., placed on sensitive sliding scale insulin while inpatient  -Obtain  Hemoglobin A1c  GERD -Placed on PPI   Advance Care Planning:   Code Status: Full Code discussed with patient's husband Consults: Neurology Family Communication: Discussed with patient's husband about this at the bedside Severity of Illness:      The appropriate patient status for this patient is OBSERVATION. Observation status is judged to be reasonable and necessary  in order to provide the required intensity of service to ensure the patient's safety. The patient's presenting symptoms, physical exam findings, and initial radiographic and laboratory data in the context of their medical condition is felt to place them at decreased risk for further clinical deterioration. Furthermore, it is anticipated that the patient will be medically stable for discharge from the hospital within 2 midnights of admission.     Author: Thad Ranger, MD 03/15/2023 4:59 PM For on call review [email or web address].

## 2023-03-15 NOTE — Consult Note (Incomplete)
NEUROLOGY CONSULTATION NOTE   Date of service: March 15, 2023 Patient Name: [name] [name] MRN:  [id number] DOB:  [birth year] Reason for consult: "***" Requesting physician: "***" _ _ _   _ __   _ __ _ _  __ __   _ __   __ _  History of Present Illness   This is a 77 year old woman with a past medical history of cardiomyopathy, carotid stenosis, chronic diastolic heart failure, CAD, DM2, hyperlipidemia, hypertension, status post bio Bentall aortic root replacement with bioprosthetic valve conduit, status post CABG x 1 in 2015 who presents after transient episode of aphasia and confusion this morning.  Last known well was initially thought to be 10:00 PM last night when her husband last had a normal interaction with her.  This morning she woke up before him and was making coffee and was confused about how to do it.  She came and told him that the coffee machine was broken but she just had not put water in it.  At this point he noticed that her speech was slurred and that she was mixing up some of her words.  She also dropped a spoon out of her right hand.  Her symptoms resolved and she was completely back to baseline but then they recurred and he called EMS.  In the EDP initial evaluation she was having word substitutions in the moderate aphasia, on my initial exam her aphasia was mild and resolved back to baseline over the course of my exam.  Head CT showed no acute process on personal review.  NIH stroke scale was 1 by the end of the call for chronic left leg weakness and drift on exam.  TNK was not administered due to acute neurologic deficits resolving.   ROS   Per HPI: all other systems reviewed and are negative  Past History   I have reviewed the following:  Past Medical History:  Diagnosis Date   Adenomatous colon polyp    Aortic regurgitation    severe by TEE s/p pericardia tissue AVR   Arthritis    Cardiomyopathy (HCC)    EF initally 25% but now normalized   Carotid stenosis  08/17/2017   1-39% bilateral by dopplers 2022   Chronic diastolic CHF (congestive heart failure), NYHA class 2 (HCC)    Coronary artery disease 10/05/2013   Severe mid LAD stenosis s/p 1 vessel CABG with LIMA to LAD   Diabetes mellitus without complication (HCC)    DX 20 YRS AGO.   TYPE 2   Dilated aortic root (HCC)    GERD (gastroesophageal reflux disease)    Tums prn   Gout    Hypercholesteremia    LDL goal < 70   Hypertension    S/P Bio-Bentall aortic root replacement with bioprosthetic valve conduit 10/27/2013   23 mm [facility] Ease bovine pericardial tissue valve and 26 mm Vascutek gelweave Val-Salva aortic graft   S/P CABG x 1 10/27/2013   LIMA to LAD   Thyroiditis 10/2009   lab and u/s-thyroid function normalized in 9/11   Past Surgical History:  Procedure Laterality Date   ABDOMINAL HYSTERECTOMY     AORTIC VALVE REPLACEMENT N/A 10/27/2013   Procedure: AORTIC VALVE REPLACEMENT (AVR);  Surgeon: Purcell Nails, MD;  Location: [facility] OR;  Service: Open Heart Surgery;  Laterality: N/A;   ASCENDING AORTIC ROOT REPLACEMENT N/A 10/27/2013   Procedure: ASCENDING AORTIC ROOT REPLACEMENT;  Surgeon: Purcell Nails, MD;  Location: [facility] OR;  Service:  Open Heart Surgery;  Laterality: N/A;   BUNIONECTOMY     bilateral   CARDIAC CATHETERIZATION     CARDIAC VALVE REPLACEMENT     AORTIC VALVE   CARPAL TUNNEL RELEASE Right    CATARACT EXTRACTION Bilateral    COLONOSCOPY     COLONOSCOPY WITH PROPOFOL N/A 05/08/2015   Procedure: COLONOSCOPY WITH PROPOFOL;  Surgeon: Charolett Bumpers, MD;  Location: [facility] ENDOSCOPY;  Service: Endoscopy;  Laterality: N/A;   CORONARY ARTERY BYPASS GRAFT N/A 10/27/2013   Procedure: CORONARY ARTERY BYPASS GRAFTING (CABG) x 1 - LIMA to LAD;  Surgeon: Purcell Nails, MD;  Location: [facility] OR;  Service: Open Heart Surgery;  Laterality: N/A;   EYE SURGERY     BILATERAL CATARACTS   INTRAOPERATIVE TRANSESOPHAGEAL ECHOCARDIOGRAM N/A 10/27/2013   Procedure: INTRAOPERATIVE  TRANSESOPHAGEAL ECHOCARDIOGRAM;  Surgeon: Purcell Nails, MD;  Location: [facility] OR;  Service: Open Heart Surgery;  Laterality: N/A;   JOINT REPLACEMENT     left total knee   KNEE ARTHROPLASTY Right 01/26/2017   Procedure: RIGHT TOTAL KNEE ARTHROPLASTY WITH COMPUTER NAVIGATION;  Surgeon: Samson Frederic, MD;  Location: [facility] OR;  Service: Orthopedics;  Laterality: Right;  Needs RNFA   KNEE ARTHROPLASTY Left 05/28/2017   Procedure: LEFT TOTAL KNEE ARTHROPLASTY WITH COMPUTER NAVIGATION;  Surgeon: Samson Frederic, MD;  Location: [facility] ORS;  Service: Orthopedics;  Laterality: Left;  Needs RNFA   LEFT AND RIGHT HEART CATHETERIZATION WITH CORONARY ANGIOGRAM N/A 10/05/2013   Procedure: LEFT AND RIGHT HEART CATHETERIZATION WITH CORONARY ANGIOGRAM;  Surgeon: Micheline Chapman, MD;  Location: [facility] CATH LAB;  Service: Cardiovascular;  Laterality: N/A;   TEE WITHOUT CARDIOVERSION N/A 10/04/2013   Procedure: TRANSESOPHAGEAL ECHOCARDIOGRAM (TEE);  Surgeon: Quintella Reichert, MD;  Location: [facility] ENDOSCOPY;  Service: Cardiovascular;  Laterality: N/A;   tubular adenomatous polyp colonoscopy  12/31/2009   Family History  Problem Relation Age of Onset   Hypertension Mother    Diabetes Mother    Arthritis/Rheumatoid Mother    Heart attack Father    Stroke Sister    Cancer Brother    Social History   Socioeconomic History   Marital status: Married    Spouse name: Not on file   Number of children: Not on file   Years of education: Not on file   Highest education level: Not on file  Occupational History   Not on file  Tobacco Use   Smoking status: Never   Smokeless tobacco: Never  Vaping Use   Vaping status: Never Used  Substance and Sexual Activity   Alcohol use: No   Drug use: No   Sexual activity: Not on file  Other Topics Concern   Not on file  Social History Narrative   Not on file   Social Determinants of Health   Financial Resource Strain: Not on file  Food Insecurity: Not on file  Transportation Needs:  Not on file  Physical Activity: Not on file  Stress: Not on file  Social Connections: Not on file   Allergies  Allergen Reactions   Atenolol Shortness Of Breath and Other (See Comments)    Edema and Headache   Lisinopril Cough   Penicillins Rash and Other (See Comments)    Has patient had a PCN reaction causing immediate rash, facial/tongue/throat swelling, SOB or lightheadedness with hypotension: No Has patient had a PCN reaction causing severe rash involving mucus membranes or skin necrosis: No Has patient had a PCN reaction that required hospitalization No Has patient had a  PCN reaction occurring within the last 10 years: No If all of the above answers are "NO", then may proceed with Cephalosporin use.G Benzathine: Local injection rash   Sulfa Antibiotics Hives and Rash    Medications   (Not in a hospital admission)    No current facility-administered medications for this encounter.  Current Outpatient Medications:    aspirin 81 MG chewable tablet, Chew 1 tablet (81 mg total) by mouth 2 (two) times daily., Disp: 60 tablet, Rfl: 1   atorvastatin (LIPITOR) 10 MG tablet, TAKE 1 TABLET BY MOUTH EVERY DAY, Disp: 90 tablet, Rfl: 3   Calcium Carb-Cholecalciferol (CALCIUM 600-D PO), Take 1 tablet by mouth daily., Disp: , Rfl:    carvedilol (COREG) 6.25 MG tablet, TAKE 1 TABLET BY MOUTH 2 TIMES DAILY WITH A MEAL., Disp: 180 tablet, Rfl: 3   clindamycin (CLEOCIN) 150 MG capsule, Take 4 capsules by mouth as needed. (Take one (1) hour prior to dental procedures), Disp: , Rfl: 2   colchicine 0.6 MG tablet, Take 0.6 mg by mouth daily., Disp: , Rfl:    fluticasone (FLONASE) 50 MCG/ACT nasal spray, Place 1 spray into both nostrils 2 (two) times daily., Disp: , Rfl:    glimepiride (AMARYL) 2 MG tablet, Take 2 mg by mouth daily with breakfast., Disp: , Rfl:    GLUCOSAMINE-CHONDROITIN PO, Take 1 tablet by mouth daily. Glucosamine 1200 mg, chondroitin 1500 mg, Disp: , Rfl:    losartan (COZAAR) 25  MG tablet, TAKE 1 TABLET (25 MG TOTAL) BY MOUTH DAILY., Disp: 90 tablet, Rfl: 2   metFORMIN (GLUCOPHAGE-XR) 500 MG 24 hr tablet, Take 500 mg by mouth in the morning and at bedtime. , Disp: , Rfl:    Multiple Vitamin (MULTIVITAMIN WITH MINERALS) TABS tablet, Take 1 tablet by mouth daily. Centrum Silver, Disp: , Rfl:    naproxen sodium (ALEVE) 220 MG tablet, Take 440 mg by mouth daily as needed (headaches)., Disp: , Rfl:    ONE TOUCH ULTRA TEST test strip, , Disp: , Rfl:    pramipexole (MIRAPEX) 0.125 MG tablet, Take 0.125 mg by mouth at bedtime., Disp: , Rfl: 6   ranitidine (ZANTAC) 150 MG tablet, Take 150 mg by mouth daily as needed for heartburn., Disp: , Rfl:   Vitals   Vitals:   03/15/23 0957 03/15/23 1003 03/15/23 1130  BP: (!) 150/115  (!) 160/75  Pulse: 80  68  Resp: 16  16  Temp: 98.1 F (36.7 C)    SpO2: 100%  100%  Weight:  74 kg      Body mass index is 30.83 kg/m.  Physical Exam   Physical Exam Gen: A&O x4, NAD HEENT: Atraumatic, normocephalic;mucous membranes moist; oropharynx clear, tongue without atrophy or fasciculations. Neck: Supple, trachea midline. Resp: CTAB, no w/r/r CV: RRR, no m/g/r; nml S1 and S2. 2+ symmetric peripheral pulses. Abd: soft/NT/ND; nabs x 4 quad Extrem: Nml bulk; no cyanosis, clubbing, or edema.  Neuro: *MS: A&O x4. Follows multi-step commands.  *Speech: fluid, nondysarthric, able to name and repeat *CN:    I: Deferred   II,III: PERRLA, VFF by confrontation, optic discs unable to be visualized 2/2 pupillary constriction   III,IV,VI: EOMI w/o nystagmus, no ptosis   V: Sensation intact from V1 to V3 to LT   VII: Eyelid closure was full.  Smile symmetric.   VIII: Hearing intact to voice   IX,X: Voice normal, palate elevates symmetrically    XI: SCM/trap 5/5 bilat   XII: Tongue protrudes midline, no  atrophy or fasciculations   *Motor:   Normal bulk.  No tremor, rigidity or bradykinesia. No pronator drift.    Strength: Dlt Bic Tri WrE  WrF FgS Gr HF KnF KnE PlF DoF    Left 5 5 5 5 5 5 5 5 5 5 5 5     Right 5 5 5 5 5 5 5 5 5 5 5 5     *Sensory: Intact to light touch, pinprick, temperature vibration throughout. Symmetric. Propioception intact bilat.  No double-simultaneous extinction.  *Coordination:  Finger-to-nose, heel-to-shin, rapid alternating motions were intact. *Reflexes:  2+ and symmetric throughout without clonus; toes down-going bilat *Gait: normal base, normal stride, normal turn. Negative Romberg.  NIHSS  1a Level of Conscious.: *** 1b LOC Questions: *** 1c LOC Commands: *** 2 Best Gaze: *** 3 Visual: *** 4 Facial Palsy: *** 5a Motor Arm - left: *** 5b Motor Arm - Right: *** 6a Motor Leg - Left: *** 6b Motor Leg - Right: *** 7 Limb Ataxia: *** 8 Sensory: *** 9 Best Language: *** 10 Dysarthria: *** 11 Extinct. and Inatten.: ***  TOTAL: ***   Premorbid mRS = ***   Labs   CBC:  Recent Labs  Lab 03/15/23 1027  WBC 9.9  NEUTROABS 6.4  HGB 12.8  HCT 37.5  MCV 95.2  PLT 199    Basic Metabolic Panel:  Lab Results  Component Value Date   NA 135 03/15/2023   K 4.2 03/15/2023   CO2 27 03/15/2023   GLUCOSE 180 (H) 03/15/2023   BUN 13 03/15/2023   CREATININE 0.74 03/15/2023   CALCIUM 10.0 03/15/2023   GFRNONAA >60 03/15/2023   GFRAA >60 05/29/2017   Lipid Panel: No results found for: "LDLCALC" HgbA1c:  Lab Results  Component Value Date   HGBA1C 6.8 (H) 10/24/2013   Urine Drug Screen: No results found for: "LABOPIA", "COCAINSCRNUR", "LABBENZ", "AMPHETMU", "THCU", "LABBARB"  Alcohol Level     Component Value Date/Time   ETH <10 03/15/2023 1027    CT Head without contrast: ***  CT angio Head and Neck with contrast: ***  MRI Brain ***  rEEG: ***  Impression   ***  Recommendations  *** ______________________________________________________________________   Thank you for the opportunity to take part in the care of this patient. If you have any further questions,  please contact the neurology consultation attending.  Signed,  Bing Neighbors, MD [facility] [phone]  If 7pm- 7am, please page neurology on call as listed in AMION.  **Any copied and pasted documentation in this note was written by me in another application not billed for and pasted by me into this document.

## 2023-03-15 NOTE — Progress Notes (Signed)
Pt in MRI will try back as schedule permits. RN said she will reach back out to EEG team when pt is ready.

## 2023-03-16 ENCOUNTER — Inpatient Hospital Stay (HOSPITAL_COMMUNITY): Payer: Medicare PPO

## 2023-03-16 ENCOUNTER — Observation Stay (HOSPITAL_COMMUNITY): Payer: Medicare PPO

## 2023-03-16 DIAGNOSIS — E1165 Type 2 diabetes mellitus with hyperglycemia: Secondary | ICD-10-CM | POA: Diagnosis present

## 2023-03-16 DIAGNOSIS — R29818 Other symptoms and signs involving the nervous system: Secondary | ICD-10-CM | POA: Diagnosis not present

## 2023-03-16 DIAGNOSIS — Z79899 Other long term (current) drug therapy: Secondary | ICD-10-CM | POA: Diagnosis not present

## 2023-03-16 DIAGNOSIS — I2510 Atherosclerotic heart disease of native coronary artery without angina pectoris: Secondary | ICD-10-CM | POA: Diagnosis present

## 2023-03-16 DIAGNOSIS — Z1152 Encounter for screening for COVID-19: Secondary | ICD-10-CM | POA: Diagnosis not present

## 2023-03-16 DIAGNOSIS — E538 Deficiency of other specified B group vitamins: Secondary | ICD-10-CM | POA: Diagnosis present

## 2023-03-16 DIAGNOSIS — R9082 White matter disease, unspecified: Secondary | ICD-10-CM | POA: Diagnosis not present

## 2023-03-16 DIAGNOSIS — Z952 Presence of prosthetic heart valve: Secondary | ICD-10-CM | POA: Diagnosis not present

## 2023-03-16 DIAGNOSIS — T426X5A Adverse effect of other antiepileptic and sedative-hypnotic drugs, initial encounter: Secondary | ICD-10-CM | POA: Diagnosis not present

## 2023-03-16 DIAGNOSIS — I700 Atherosclerosis of aorta: Secondary | ICD-10-CM | POA: Diagnosis not present

## 2023-03-16 DIAGNOSIS — E876 Hypokalemia: Secondary | ICD-10-CM | POA: Diagnosis present

## 2023-03-16 DIAGNOSIS — Z7984 Long term (current) use of oral hypoglycemic drugs: Secondary | ICD-10-CM | POA: Diagnosis not present

## 2023-03-16 DIAGNOSIS — Z860101 Personal history of adenomatous and serrated colon polyps: Secondary | ICD-10-CM | POA: Diagnosis not present

## 2023-03-16 DIAGNOSIS — G40909 Epilepsy, unspecified, not intractable, without status epilepticus: Principal | ICD-10-CM

## 2023-03-16 DIAGNOSIS — R509 Fever, unspecified: Secondary | ICD-10-CM | POA: Diagnosis present

## 2023-03-16 DIAGNOSIS — I110 Hypertensive heart disease with heart failure: Secondary | ICD-10-CM | POA: Diagnosis present

## 2023-03-16 DIAGNOSIS — I429 Cardiomyopathy, unspecified: Secondary | ICD-10-CM | POA: Diagnosis present

## 2023-03-16 DIAGNOSIS — E871 Hypo-osmolality and hyponatremia: Secondary | ICD-10-CM | POA: Diagnosis not present

## 2023-03-16 DIAGNOSIS — G459 Transient cerebral ischemic attack, unspecified: Secondary | ICD-10-CM | POA: Diagnosis present

## 2023-03-16 DIAGNOSIS — R4182 Altered mental status, unspecified: Secondary | ICD-10-CM | POA: Diagnosis not present

## 2023-03-16 DIAGNOSIS — K219 Gastro-esophageal reflux disease without esophagitis: Secondary | ICD-10-CM | POA: Diagnosis present

## 2023-03-16 DIAGNOSIS — R569 Unspecified convulsions: Secondary | ICD-10-CM | POA: Diagnosis not present

## 2023-03-16 DIAGNOSIS — I5032 Chronic diastolic (congestive) heart failure: Secondary | ICD-10-CM | POA: Diagnosis present

## 2023-03-16 DIAGNOSIS — R413 Other amnesia: Secondary | ICD-10-CM | POA: Diagnosis present

## 2023-03-16 DIAGNOSIS — E7800 Pure hypercholesterolemia, unspecified: Secondary | ICD-10-CM | POA: Diagnosis present

## 2023-03-16 DIAGNOSIS — G928 Other toxic encephalopathy: Secondary | ICD-10-CM | POA: Diagnosis present

## 2023-03-16 DIAGNOSIS — T3695XA Adverse effect of unspecified systemic antibiotic, initial encounter: Secondary | ICD-10-CM | POA: Diagnosis not present

## 2023-03-16 DIAGNOSIS — L271 Localized skin eruption due to drugs and medicaments taken internally: Secondary | ICD-10-CM | POA: Diagnosis not present

## 2023-03-16 DIAGNOSIS — Z7982 Long term (current) use of aspirin: Secondary | ICD-10-CM | POA: Diagnosis not present

## 2023-03-16 LAB — CBC
HCT: 34.9 % — ABNORMAL LOW (ref 36.0–46.0)
Hemoglobin: 11.8 g/dL — ABNORMAL LOW (ref 12.0–15.0)
MCH: 31.6 pg (ref 26.0–34.0)
MCHC: 33.8 g/dL (ref 30.0–36.0)
MCV: 93.6 fL (ref 80.0–100.0)
Platelets: 178 10*3/uL (ref 150–400)
RBC: 3.73 MIL/uL — ABNORMAL LOW (ref 3.87–5.11)
RDW: 12.9 % (ref 11.5–15.5)
WBC: 12.8 10*3/uL — ABNORMAL HIGH (ref 4.0–10.5)
nRBC: 0 % (ref 0.0–0.2)

## 2023-03-16 LAB — PROTEIN AND GLUCOSE, CSF
Glucose, CSF: 54 mg/dL (ref 40–70)
Total  Protein, CSF: 81 mg/dL — ABNORMAL HIGH (ref 15–45)

## 2023-03-16 LAB — MENINGITIS/ENCEPHALITIS PANEL (CSF)

## 2023-03-16 LAB — HEMOGLOBIN A1C
Hgb A1c MFr Bld: 7.1 % — ABNORMAL HIGH (ref 4.8–5.6)
Mean Plasma Glucose: 157.07 mg/dL

## 2023-03-16 LAB — ECHOCARDIOGRAM COMPLETE
AR max vel: 1.43 cm2
AV Area VTI: 1.39 cm2
AV Area mean vel: 1.47 cm2
AV Mean grad: 11 mm[Hg]
AV Peak grad: 21 mm[Hg]
Ao pk vel: 2.29 m/s
Area-P 1/2: 3.12 cm2
Calc EF: 41.3 %
Height: 60.5 in
MV M vel: 3.9 m/s
MV Peak grad: 60.8 mm[Hg]
S' Lateral: 4.1 cm
Single Plane A2C EF: 46.4 %
Single Plane A4C EF: 41.9 %
Weight: 2578.5 [oz_av]

## 2023-03-16 LAB — PROCALCITONIN: Procalcitonin: <0.1 ng/mL

## 2023-03-16 LAB — CSF CELL COUNT WITH DIFFERENTIAL
RBC Count, CSF: 2 /mm3 — ABNORMAL HIGH
Tube #: 3
WBC, CSF: 2 /mm3 (ref 0–5)

## 2023-03-16 LAB — GLUCOSE, CAPILLARY
Glucose-Capillary: 101 mg/dL — ABNORMAL HIGH (ref 70–99)
Glucose-Capillary: 108 mg/dL — ABNORMAL HIGH (ref 70–99)
Glucose-Capillary: 108 mg/dL — ABNORMAL HIGH (ref 70–99)
Glucose-Capillary: 108 mg/dL — ABNORMAL HIGH (ref 70–99)
Glucose-Capillary: 110 mg/dL — ABNORMAL HIGH (ref 70–99)
Glucose-Capillary: 113 mg/dL — ABNORMAL HIGH (ref 70–99)

## 2023-03-16 LAB — LIPID PANEL
Cholesterol: 111 mg/dL (ref 0–200)
HDL: 59 mg/dL (ref >40)
LDL Cholesterol: 31 mg/dL (ref 0–99)
Total CHOL/HDL Ratio: 1.9 {ratio}
Triglycerides: 107 mg/dL (ref <150)
VLDL: 21 mg/dL (ref 0–40)

## 2023-03-16 LAB — BRAIN NATRIURETIC PEPTIDE: B Natriuretic Peptide: 474.9 pg/mL — ABNORMAL HIGH (ref 0.0–100.0)

## 2023-03-16 LAB — C-REACTIVE PROTEIN: CRP: <0.5 mg/dL

## 2023-03-16 LAB — FOLATE: Folate: 38.6 ng/mL

## 2023-03-16 LAB — AMMONIA: Ammonia: 19 umol/L (ref 9–35)

## 2023-03-16 MED ORDER — LIDOCAINE 1 % OPTIME INJ - NO CHARGE
5.0000 mL | Freq: Once | INTRAMUSCULAR | Status: AC
  Administered 2023-03-16: 5 mL via INTRADERMAL
  Filled 2023-03-16: qty 6

## 2023-03-16 MED ORDER — CYANOCOBALAMIN 1000 MCG/ML IJ SOLN
1000.0000 ug | Freq: Every day | INTRAMUSCULAR | Status: AC
  Administered 2023-03-16 – 2023-03-18 (×3): 1000 ug via SUBCUTANEOUS
  Filled 2023-03-16 (×3): qty 1

## 2023-03-16 MED ORDER — LEVETIRACETAM 500 MG PO TABS
500.0000 mg | ORAL_TABLET | Freq: Two times a day (BID) | ORAL | Status: DC
  Administered 2023-03-17: 500 mg via ORAL
  Filled 2023-03-16: qty 1

## 2023-03-16 MED ORDER — ACETAMINOPHEN 10 MG/ML IV SOLN
1000.0000 mg | INTRAVENOUS | Status: AC
  Administered 2023-03-16: 1000 mg via INTRAVENOUS
  Filled 2023-03-16: qty 100

## 2023-03-16 MED ORDER — SODIUM CHLORIDE 0.9 % IV SOLN
INTRAVENOUS | Status: DC
Start: 2023-03-16 — End: 2023-03-17

## 2023-03-16 MED ORDER — HALOPERIDOL LACTATE 5 MG/ML IJ SOLN
2.0000 mg | Freq: Four times a day (QID) | INTRAMUSCULAR | Status: DC | PRN
  Administered 2023-03-16: 2 mg via INTRAMUSCULAR
  Filled 2023-03-16 (×2): qty 1

## 2023-03-16 MED ORDER — LEVETIRACETAM IN NACL 500 MG/100ML IV SOLN
500.0000 mg | Freq: Two times a day (BID) | INTRAVENOUS | Status: DC
  Administered 2023-03-16: 500 mg via INTRAVENOUS
  Filled 2023-03-16: qty 100

## 2023-03-16 MED ORDER — VITAMIN B-12 1000 MCG PO TABS
1000.0000 ug | ORAL_TABLET | Freq: Every day | ORAL | Status: DC
  Administered 2023-03-19 – 2023-03-20 (×2): 1000 ug via ORAL
  Filled 2023-03-16 (×2): qty 1

## 2023-03-16 NOTE — Progress Notes (Signed)
Acute metabolic encephalopathy concern for stroke versus seizure Fever of unknown origin: Patient's nurse reported that patient is spiking fever 101 F.  In the ED patient rectal Tylenol suppository.  Unable to give anything by mouth as patient has altered mentation and high risk of aspiration. Per chart review patient has been admitted for seizure versus TIA. UA rare bacteria but there is no leukocyte esterase.  Normal prolactin level.  Patient does not have any cough.  Respiratory panel negative. Pending blood cultures.  CBC no evidence of leukocytosis.  Chest x-ray showed pulmonary congestion without any overt edema.  CT head and MRI head unremarkable for any evidence of ischemic stroke or lesion. -Unclear etiology of fever at this moment. - Continue Tylenol suppository as needed.  As patient is having persistent high temperature and bedside nurse is worried that he rectal suppository is not working well give 1 dose of Tylenol 1000 mg IV. -Will follow-up with blood cultures. -Currently continuing EEG. - If EEG unremarkable and patient continues to spike fever in the setting of altered mentation need to rule out other source of infection like patient needs LP?  Tereasa Coop, MD [facility] 03/16/2023, 2:36 AM

## 2023-03-16 NOTE — Progress Notes (Signed)
I had extensive discussion with both daughters at the bedside about details of LP at bedside along with risk and benefits. Patient is still significantly encephalopathic and not redirectable. Discussed using small dose of Ativan but patient has had paradoxical agitation and confusion and therefore daughter's declined. I discussed using a small dose of opiod IV and then possibly reversing with narcan afterwards. However, she is having periods of apnea on monitor and concern that opiods will worsen this.  Ultimately, daughters would like to hold off on LP at this time and will continue empiric coverage. Requesting revisiting discussion for LP when patient is more calm and ativan from yesterday has worn off.  Erick Blinks [facility]

## 2023-03-16 NOTE — Procedures (Signed)
LUMBAR PUNCTURE (SPINAL TAP) PROCEDURE NOTE  Indication: AMS    Proceduralists: Gevena Mart Dr. Amada Jupiter    Risks, benefits and alternatives of the procedure were dicussed with the patient including but not limited to post-LP headache, bleeding, infection, weakness/numbness of legs(radiculopathy), death.    Consent obtained from: Husband - Kehaulani Odegaard    Procedure Note The patient was prepped and draped, and using sterile technique a 20 gauge quinke spinal needle was inserted in the L4-5 space, was unsuccessful. Another attempt was made at L3-4 and was unsuccessful. Patient tolerated procedure well. Family updated   Gevena Mart DNP, ACNPC-AG  [facility]

## 2023-03-16 NOTE — Progress Notes (Signed)
Called by neuro to re connect the pt to EEG. PT transferred to 5W and computer off. Study restarted.

## 2023-03-16 NOTE — Plan of Care (Signed)
  Problem: Education: Goal: Knowledge of General Education information will improve Description: Including pain rating scale, medication(s)/side effects and non-pharmacologic comfort measures Outcome: Not Progressing   Problem: Health Behavior/Discharge Planning: Goal: Ability to manage health-related needs will improve Outcome: Not Progressing   Problem: Clinical Measurements: Goal: Ability to maintain clinical measurements within normal limits will improve Outcome: Not Progressing Goal: Will remain free from infection Outcome: Not Progressing Goal: Diagnostic test results will improve Outcome: Not Progressing Goal: Respiratory complications will improve Outcome: Not Progressing Goal: Cardiovascular complication will be avoided Outcome: Not Progressing   Problem: Activity: Goal: Risk for activity intolerance will decrease Outcome: Not Progressing   Problem: Nutrition: Goal: Adequate nutrition will be maintained Outcome: Not Progressing   Problem: Coping: Goal: Level of anxiety will decrease Outcome: Not Progressing   Problem: Elimination: Goal: Will not experience complications related to bowel motility Outcome: Not Progressing Goal: Will not experience complications related to urinary retention Outcome: Not Progressing   Problem: Pain Management: Goal: General experience of comfort will improve Outcome: Not Progressing   Problem: Safety: Goal: Ability to remain free from injury will improve Outcome: Not Progressing   Problem: Skin Integrity: Goal: Risk for impaired skin integrity will decrease Outcome: Not Progressing   Problem: Education: Goal: Ability to describe self-care measures that may prevent or decrease complications (Diabetes Survival Skills Education) will improve Outcome: Not Progressing Goal: Individualized Educational Video(s) Outcome: Not Progressing   Problem: Coping: Goal: Ability to adjust to condition or change in health will  improve Outcome: Not Progressing   Problem: Fluid Volume: Goal: Ability to maintain a balanced intake and output will improve Outcome: Not Progressing   Problem: Health Behavior/Discharge Planning: Goal: Ability to identify and utilize available resources and services will improve Outcome: Not Progressing Goal: Ability to manage health-related needs will improve Outcome: Not Progressing   Problem: Metabolic: Goal: Ability to maintain appropriate glucose levels will improve Outcome: Not Progressing   Problem: Nutritional: Goal: Maintenance of adequate nutrition will improve Outcome: Not Progressing Goal: Progress toward achieving an optimal weight will improve Outcome: Not Progressing   Problem: Skin Integrity: Goal: Risk for impaired skin integrity will decrease Outcome: Not Progressing   Problem: Tissue Perfusion: Goal: Adequacy of tissue perfusion will improve Outcome: Not Progressing   Problem: Education: Goal: Knowledge of disease or condition will improve Outcome: Not Progressing Goal: Knowledge of secondary prevention will improve (MUST DOCUMENT ALL) Outcome: Not Progressing Goal: Knowledge of patient specific risk factors will improve Loraine Leriche N/A or DELETE if not current risk factor) Outcome: Not Progressing   Problem: Ischemic Stroke/TIA Tissue Perfusion: Goal: Complications of ischemic stroke/TIA will be minimized Outcome: Not Progressing   Problem: Coping: Goal: Will verbalize positive feelings about self Outcome: Not Progressing Goal: Will identify appropriate support needs Outcome: Not Progressing   Problem: Health Behavior/Discharge Planning: Goal: Ability to manage health-related needs will improve Outcome: Not Progressing Goal: Goals will be collaboratively established with patient/family Outcome: Not Progressing   Problem: Self-Care: Goal: Ability to participate in self-care as condition permits will improve Outcome: Not Progressing Goal:  Verbalization of feelings and concerns over difficulty with self-care will improve Outcome: Not Progressing Goal: Ability to communicate needs accurately will improve Outcome: Not Progressing   Problem: Nutrition: Goal: Risk of aspiration will decrease Outcome: Not Progressing Goal: Dietary intake will improve Outcome: Not Progressing

## 2023-03-16 NOTE — Progress Notes (Signed)
PROGRESS NOTE                                                                                                                                                                                                             Patient Demographics:    [name] [name], is a 77 y.o. female, DOB - 10-03-1945, WJX:[id number]  Outpatient Primary MD for the patient is Thana Ates, MD    LOS - 0  Admit date - 03/15/2023    Chief Complaint  Patient presents with   Aphasia       Brief Narrative (HPI from H&P)   77 y.o. female with PMH significant of coronary artery disease, chronic diastolic CHF, diabetes mellitus 2, HTN, hyperlipidemia AR s/p bioprosthetic valve repair, CABG in 2015 presented to ED with sudden confusion and aphasia this morning.  History was obtained from patient's husband and daughter at the bedside.  Per husband, she was in her normal state of health until last night until 10 PM.  This morning, around 8:15 am, he noted that she was confused, was trying to make coffee however had not put the water in the coffee machine.  He noted that her speech was slurred and then dropped as 4 out of 5 right hand.  She was also noted to have right facial droop, came to the ER and was seen by neurology and admitted to the hospital for further workup.   Subjective:    [name] [name] today in bed, quite confused, unable to provide reliable review of systems.   Assessment  & Plan :    Severe toxic and metabolic encephalopathy present on admission. -Patient underwent stroke workup including MRI brain, CTA head and neck, negative for acute infarct EEG unremarkable, does have leukocytosis, suspicious for meningitis.  For now continue empiric IV acyclovir and antibiotics, LP will be deferred to neurology Case discussed with neurologist Dr. Amada Jupiter on 03/16/2023.  Continue to monitor with supportive care.  UA was unremarkable, stable ammonia,  stable TSH, low B12.  Follow cultures.   Essential hypertension, benign -H old antihypertensives, continue gentle hydration   Acute metabolic encephalopathy in the setting of possible dementia - Per patient's family, she has been having memory issues for last 6 to 9 months, ambulates with a cane, low B12 replaced, TSH stable, B1 pending.   Coronary artery disease,  Chronic diastolic CHF (congestive heart failure), NYHA class 2 (HCC) - 2D echo 04/28/2022 had shown EF 55 to 60%, G2 DD, mild aortic valve regurgitation, 23 mm valve present, No complaints of recent chest pain, shortness of breath or any cardiac symptoms   Dyslipidemia, goal LDL below 70   -Currently n.p.o., follow lipid panel   GERD  -  PPI   Diabetes Mellitus type II, NIDDM uncontrolled with hyperglycemia - ISS  Lab Results  Component Value Date   HGBA1C 7.1 (H) 03/16/2023   CBG (last 3)  Recent Labs    03/15/23 2143 03/16/23 0126 03/16/23 0429  GLUCAP 111* 110* 101*        Condition - Extremely Guarded  Family Communication  :  Husband bedside 03/16/23  Code Status :  Full  Consults  :  Neurology  PUD Prophylaxis :    Procedures  :     MRI - Motion degraded exam. Within this limitation, negative for an acute infarct   EEG - Non acute  CT Head -   1. No acute cortically based infarct or acute intracranial hemorrhage identified. ASPECTS 10. 2. Moderately advanced cerebral white matter changes most commonly due to small vessel disease.   CTA Head -   1. Negative for large vessel occlusion. 2. ICA siphon calcified atherosclerosis with up to Moderate bilateral siphon stenosis. 3. Extracranial generalized arterial tortuosity but generally mild extracranial atherosclerosis. No other hemodynamically significant arterial stenosis. Aortic Atherosclerosis (ICD10-I70.0). 4. Evidence of Pulmonary Edema in the visible upper chest.       Disposition Plan  :    Status is: Observation  DVT Prophylaxis  :     enoxaparin (LOVENOX) injection 40 mg Start: 03/15/23 2115    Lab Results  Component Value Date   PLT 178 03/16/2023    Diet :  Diet Order             Diet NPO time specified  Diet effective now                    Inpatient Medications  Scheduled Meds:   stroke: early stages of recovery book   Does not apply Once   aspirin  300 mg Rectal Daily   Or   aspirin  325 mg Oral Daily   cyanocobalamin  1,000 mcg Subcutaneous Daily   [START ON 03/19/2023] vitamin B-12  1,000 mcg Oral Daily   enoxaparin (LOVENOX) injection  40 mg Subcutaneous Q24H   insulin aspart  0-9 Units Subcutaneous Q4H   levETIRAcetam  250 mg Oral BID   Continuous Infusions:  sodium chloride     acyclovir 575 mg (03/16/23 0537)   levETIRAcetam 250 mg (03/15/23 2251)   meropenem (MERREM) IV 2 g (03/16/23 0524)   vancomycin     PRN Meds:.acetaminophen **OR** acetaminophen (TYLENOL) oral liquid 160 mg/5 mL **OR** acetaminophen    Objective:   Vitals:   03/16/23 0329 03/16/23 0400 03/16/23 0430 03/16/23 0500  BP: (!) 126/94 130/71  134/62  Pulse: 85 70 82 76  Resp: (!) 24 (!) 22 16 (!) 24  Temp:  98.7 F (37.1 C) 99.9 F (37.7 C)   TempSrc:  Axillary Rectal   SpO2: 95% 94% 93% 94%  Weight:      Height:        Wt Readings from Last 3 Encounters:  03/15/23 73.1 kg  04/17/22 74.8 kg  08/20/20 74.8 kg     Intake/Output Summary (Last 24 hours) at 03/16/2023  9811 Last data filed at 03/16/2023 0537 Gross per 24 hour  Intake --  Output 400 ml  Net -400 ml     Physical Exam  Awake but confused, No new F.N deficits, Normal affect [name],[name] Supple Neck, No JVD,   Symmetrical Chest wall movement, Good air movement bilaterally, CTAB RRR,No Gallops,Rubs or new Murmurs,  +ve B.Sounds, Abd Soft, No tenderness,   No Cyanosis, Clubbing or edema       Data Review:    Recent Labs  Lab 03/15/23 1027 03/15/23 1842 03/16/23 0459  WBC 9.9  --  12.8*  HGB 12.8 12.2 11.8*  HCT 37.5  36.0 34.9*  PLT 199  --  178  MCV 95.2  --  93.6  MCH 32.5  --  31.6  MCHC 34.1  --  33.8  RDW 13.2  --  12.9  LYMPHSABS 2.7  --   --   MONOABS 0.6  --   --   EOSABS 0.2  --   --   BASOSABS 0.1  --   --     Recent Labs  Lab 03/15/23 1027 03/15/23 1840 03/15/23 1842 03/15/23 2105 03/16/23 0459 03/16/23 0555  NA 135  --  134*  --   --   --   K 4.2  --  3.8  --   --   --   CL 98  --  101  --   --   --   CO2 27  --   --   --   --   --   ANIONGAP 10  --   --   --   --   --   GLUCOSE 180*  --  101*  --   --   --   BUN 13  --  8  --   --   --   CREATININE 0.74  --  0.70 0.78  --   --   AST 21  --   --   --   --   --   ALT 13  --   --   --   --   --   ALKPHOS 46  --   --   --   --   --   BILITOT 0.8  --   --   --   --   --   ALBUMIN 4.5  --   --   --   --   --   CRP  --   --   --   --   --  <0.5  PROCALCITON  --  <0.10  --   --   --  <0.10  INR 1.1  --   --   --   --   --   TSH  --   --   --  1.086  --   --   HGBA1C  --   --   --   --  7.1*  --   AMMONIA  --   --   --   --   --  19  BNP  --   --   --   --   --  474.9*  CALCIUM 10.0  --   --   --   --   --       Recent Labs  Lab 03/15/23 1027 03/15/23 1840 03/15/23 2105 03/16/23 0459 03/16/23 0555  CRP  --   --   --   --  <0.5  PROCALCITON  --  <  0.10  --   --  <0.10  INR 1.1  --   --   --   --   TSH  --   --  1.086  --   --   HGBA1C  --   --   --  7.1*  --   AMMONIA  --   --   --   --  19  BNP  --   --   --   --  474.9*  CALCIUM 10.0  --   --   --   --     Lab Results  Component Value Date   CHOL 111 03/16/2023   HDL 59 03/16/2023   LDLCALC 31 03/16/2023   TRIG 107 03/16/2023   CHOLHDL 1.9 03/16/2023    Lab Results  Component Value Date   HGBA1C 7.1 (H) 03/16/2023   Recent Labs    03/15/23 2105  TSH 1.086   Recent Labs    03/15/23 2105 03/16/23 0459  VITAMINB12 117*  --   FOLATE  --  38.6      Micro Results Recent Results (from the past 240 hour(s))  Respiratory (~20 pathogens) panel by  PCR     Status: None   Collection Time: 03/15/23  5:33 PM   Specimen: Nasopharyngeal Swab; Respiratory  Result Value Ref Range Status   Adenovirus NOT DETECTED NOT DETECTED Final   Coronavirus 229E NOT DETECTED NOT DETECTED Final    Comment: (NOTE) The Coronavirus on the Respiratory Panel, DOES NOT test for the novel  Coronavirus (2019 nCoV)    Coronavirus HKU1 NOT DETECTED NOT DETECTED Final   Coronavirus NL63 NOT DETECTED NOT DETECTED Final   Coronavirus OC43 NOT DETECTED NOT DETECTED Final   Metapneumovirus NOT DETECTED NOT DETECTED Final   Rhinovirus / Enterovirus NOT DETECTED NOT DETECTED Final   Influenza A NOT DETECTED NOT DETECTED Final   Influenza B NOT DETECTED NOT DETECTED Final   Parainfluenza Virus 1 NOT DETECTED NOT DETECTED Final   Parainfluenza Virus 2 NOT DETECTED NOT DETECTED Final   Parainfluenza Virus 3 NOT DETECTED NOT DETECTED Final   Parainfluenza Virus 4 NOT DETECTED NOT DETECTED Final   Respiratory Syncytial Virus NOT DETECTED NOT DETECTED Final   Bordetella pertussis NOT DETECTED NOT DETECTED Final   Bordetella Parapertussis NOT DETECTED NOT DETECTED Final   Chlamydophila pneumoniae NOT DETECTED NOT DETECTED Final   Mycoplasma pneumoniae NOT DETECTED NOT DETECTED Final    Comment: Performed at [facility], [address] [place], [place] [address]  Resp panel by RT-PCR (RSV, Flu A&B, Covid) Nasopharyngeal Swab     Status: None   Collection Time: 03/15/23  5:33 PM   Specimen: Nasopharyngeal Swab; Nasal Swab  Result Value Ref Range Status   SARS Coronavirus 2 by RT PCR NEGATIVE NEGATIVE Final   Influenza A by PCR NEGATIVE NEGATIVE Final   Influenza B by PCR NEGATIVE NEGATIVE Final    Comment: (NOTE) The Xpert Xpress SARS-CoV-2/FLU/RSV plus assay is intended as an aid in the diagnosis of influenza from Nasopharyngeal swab specimens and should not be used as a sole basis for treatment. Nasal washings and aspirates are unacceptable for Xpert Xpress  SARS-CoV-2/FLU/RSV testing.  Fact Sheet for Patients: BloggerCourse.com  Fact Sheet for Healthcare Providers: SeriousBroker.it  This test is not yet approved or cleared by the [place] FDA and has been authorized for detection and/or diagnosis of SARS-CoV-2 by FDA under an Emergency Use Authorization (EUA). This EUA will remain in  effect (meaning this test can be used) for the duration of the COVID-19 declaration under Section 564(b)(1) of the Act, 21 U.S.C. section 360bbb-3(b)(1), unless the authorization is terminated or revoked.     Resp Syncytial Virus by PCR NEGATIVE NEGATIVE Final    Comment: (NOTE) Fact Sheet for Patients: BloggerCourse.com  Fact Sheet for Healthcare Providers: SeriousBroker.it  This test is not yet approved or cleared by the [place] FDA and has been authorized for detection and/or diagnosis of SARS-CoV-2 by FDA under an Emergency Use Authorization (EUA). This EUA will remain in effect (meaning this test can be used) for the duration of the COVID-19 declaration under Section 564(b)(1) of the Act, 21 U.S.C. section 360bbb-3(b)(1), unless the authorization is terminated or revoked.  Performed at [facility], [address] [place], [place] [address]     Radiology Reports DG Chest [place] 1 View  Result Date: 03/16/2023 CLINICAL DATA:  77 year old female with history of shortness of breath. EXAM: PORTABLE CHEST 1 VIEW COMPARISON:  Chest x-ray 03/15/2023. FINDINGS: Lung volumes are normal. No consolidative airspace disease. No definite pleural effusions. No pneumothorax. No evidence of pulmonary edema. Heart size is normal. Upper mediastinal contours are within normal limits. Atherosclerotic calcifications are noted in the thoracic aorta. Status post median sternotomy for aortic valve replacement (a stented bioprosthesis is noted). IMPRESSION:  1. No radiographic evidence of acute cardiopulmonary disease. 2. Aortic atherosclerosis. Electronically Signed   By: Trudie Reed M.D.   On: 03/16/2023 07:30   Overnight EEG with video  Result Date: 03/16/2023 Charlsie Quest, MD     03/16/2023  5:49 AM Patient Name: ONEISHA OZIER MRN: [id number] Epilepsy Attending: Charlsie Quest Referring Physician/Provider: Gordy Councilman, MD Duration: 03/15/2023 1516 to 03/16/2023 0546 Patient history: 77yo F with aphasia getting eeg to evaluate for seizure Level of alertness: Awake, asleep AEDs during EEG study: LEV Technical aspects: This EEG study was done with scalp electrodes positioned according to the 10-20 International system of electrode placement. Electrical activity was reviewed with band pass filter of 1-70Hz , sensitivity of 7 uV/mm, display speed of 55mm/sec with a 60Hz  notched filter applied as appropriate. EEG data were recorded continuously and digitally stored.  Video monitoring was available and reviewed as appropriate. Description: The posterior dominant rhythm consists of 8 Hz activity of moderate voltage (25-35 uV) seen predominantly in posterior head regions, asymmetric ( left < right)and reactive to eye opening and eye closing. Sleep was characterized by vertex waves, sleep spindles (12 to 14 Hz), maximal frontocentral region. EEG showed continuous 2 to 3 Hz delta slowing in left hemisphere. Sharp waves were noted in left frontal region. Hyperventilation and photic stimulation were not performed.   EEG was disconnected between 03/15/2023 1923 to 03/16/2023 0114 due to room transfer ABNORMALITY -Sharp wave, left frontal region. - Continuous slow, left hemisphere IMPRESSION: This study showed evidence of epileptogenicity arising from left frontal region. Additionally there was cortical dysfunction arising from left hemisphere likely secondary to underlying structural abnormality, post-ictal state. No seizures or epileptiform discharges were seen  throughout the recording. Priyanka Annabelle Harman   DG CHEST PORT 1 VIEW  Result Date: 03/15/2023 CLINICAL DATA:  Seizure, right-sided facial droop EXAM: PORTABLE CHEST 1 VIEW COMPARISON:  05/02/2020 FINDINGS: Single frontal view of the chest demonstrates postsurgical changes from median sternotomy and aortic valve replacement. Stable cardiac silhouette. Pulmonary vascular congestion without acute airspace disease, effusion, or pneumothorax. No acute bony abnormalities. IMPRESSION: 1. Pulmonary vascular congestion without overt edema.  Electronically Signed   By: Sharlet Salina M.D.   On: 03/15/2023 18:59   MR BRAIN WO CONTRAST  Result Date: 03/15/2023 CLINICAL DATA:  Transient ischemic attack (TIA) EXAM: MRI HEAD WITHOUT CONTRAST TECHNIQUE: Multiplanar, multiecho pulse sequences of the brain and surrounding structures were obtained without intravenous contrast. COMPARISON:  Same day stroke code CT and CTA head/neck FINDINGS: Limitations: Motion degraded exam. Axial FLAIR sequences are essentially nondiagnostic. Brain: Negative for an acute infarct. No hemorrhage. No hydrocephalus. No extra-axial fluid collection. No mass effect. No mass lesion. Vascular: Normal flow voids. Skull and upper cervical spine: Normal marrow signal. Sinuses/Orbits: Small bilateral mastoid effusions. No middle ear effusion. Bilateral lens replacement. Orbits are otherwise unremarkable. Other: None. IMPRESSION: Motion degraded exam. Within this limitation, negative for an acute infarct Electronically Signed   By: Lorenza Cambridge M.D.   On: 03/15/2023 14:36   CT ANGIO HEAD NECK W WO CM (CODE STROKE)  Result Date: 03/15/2023 CLINICAL DATA:  77 year old female code stroke presentation. EXAM: CT ANGIOGRAPHY HEAD AND NECK WITH AND WITHOUT CONTRAST TECHNIQUE: Multidetector CT imaging of the head and neck was performed using the standard protocol during bolus administration of intravenous contrast. Multiplanar CT image reconstructions and MIPs  were obtained to evaluate the vascular anatomy. Carotid stenosis measurements (when applicable) are obtained utilizing NASCET criteria, using the distal internal carotid diameter as the denominator. RADIATION DOSE REDUCTION: This exam was performed according to the departmental dose-optimization program which includes automated exposure control, adjustment of the mA and/or kV according to patient size and/or use of iterative reconstruction technique. CONTRAST:  75mL OMNIPAQUE IOHEXOL 350 MG/ML SOLN COMPARISON:  Head CT 1035 hours today. FINDINGS: CTA NECK Skeleton: Sternotomy. Cervical spine degeneration, mostly right side facet arthropathy. No acute osseous abnormality identified. Upper chest: Septal thickening, ground-glass opacity, dependent lung opacity. No apical pleural effusions. Negative visible mediastinum. Other neck: Negative. Aortic arch: Calcified aortic atherosclerosis.  3 vessel arch. Right carotid system: Brachiocephalic artery and proximal right CCA are tortuous with mild plaque, no stenosis. Mild calcified plaque at the right ICA origin and bulb without stenosis. Highly tortuous right ICA distal to the bulb. Left carotid system: Similar tortuosity. Low-density soft plaque at the ventral left CCA at the level of the larynx without stenosis on series 4, image 111. Otherwise mild proximal ICA plaque without stenosis. Tortuous left ICA distal to the bulb. Vertebral arteries: Tortuous proximal right subclavian artery with soft and calcified plaque but no significant stenosis. Right vertebral artery origin remains normal. Tortuous right V1 segment and mildly late entry of the right vertebral into the transverse foramen. Right vertebral is patent to the skull base without stenosis. Tortuous and atherosclerotic proximal left subclavian artery with a kinked appearance at the thoracic inlet but no atherosclerotic stenosis. Left vertebral artery origin is tortuous without stenosis. Left vertebral artery  appears mildly dominant and is patent to the skull base with no significant plaque or stenosis. CTA HEAD Posterior circulation: Distal vertebral arteries and vertebrobasilar junction are patent without stenosis. Normal PICA origins. Patent basilar artery without stenosis. Patent SCA and PCA origins. Posterior communicating arteries are diminutive or absent. Bilateral PCA branches are within normal limits, there is mild right PCA P2 segment irregularity. Anterior circulation: Both ICA siphons are patent. Left siphon is heavily calcified with mild to moderate stenosis at the anterior genu. Right siphon is heavily calcified in the supraclinoid segment with mild-to-moderate stenosis. Patent carotid termini, MCA and ACA origins. Dominant left and diminutive right ACA A1 segments. Normal  anterior communicating artery. Bilateral ACA branches are within normal limits. Left MCA M1 segment is patent without stenosis. Left MCA trifurcation is mildly ectatic, patent without stenosis. Left MCA branches are within normal limits. Right MCA M1 segment and bifurcation are patent without stenosis. Right MCA branches are within normal limits. Venous sinuses: Not evaluated due to early contrast timing. Anatomic variants: Dominant left ACA A1. Mildly dominant left vertebral artery. Review of the MIP images confirms the above findings IMPRESSION: 1. Negative for large vessel occlusion. 2. ICA siphon calcified atherosclerosis with up to Moderate bilateral siphon stenosis. 3. Extracranial generalized arterial tortuosity but generally mild extracranial atherosclerosis. No other hemodynamically significant arterial stenosis. Aortic Atherosclerosis (ICD10-I70.0). 4. Evidence of Pulmonary Edema in the visible upper chest. Electronically Signed   By: Odessa Fleming M.D.   On: 03/15/2023 11:54   CT HEAD CODE STROKE WO CONTRAST  Addendum Date: 03/15/2023   ADDENDUM REPORT: 03/15/2023 11:17 ADDENDUM: Study discussed by telephone with PA BROOKE SMALL  on 03/15/2023 at 1106 hours. Electronically Signed   By: Odessa Fleming M.D.   On: 03/15/2023 11:17   Result Date: 03/15/2023 CLINICAL DATA:  Code stroke. 77 year old female last known well 2200 hours. Slurred speech, aphasia. EXAM: CT HEAD WITHOUT CONTRAST TECHNIQUE: Contiguous axial images were obtained from the base of the skull through the vertex without intravenous contrast. RADIATION DOSE REDUCTION: This exam was performed according to the departmental dose-optimization program which includes automated exposure control, adjustment of the mA and/or kV according to patient size and/or use of iterative reconstruction technique. COMPARISON:  None Available. FINDINGS: Brain: No midline shift, mass effect, or evidence of intracranial mass lesion. No ventriculomegaly. No acute intracranial hemorrhage identified. Cerebral volume is within normal limits for age. Patchy and confluent bilateral white matter hypodensity with some deep white matter involvement in both hemispheres. Deep gray nuclei relatively spared. No cortically based acute infarct identified. No cortical encephalomalacia identified. Vascular: Calcified atherosclerosis at the skull base. No suspicious intracranial vascular hyperdensity. Skull: No acute osseous abnormality identified. Sinuses/Orbits: Visualized paranasal sinuses and mastoids are clear. Other: No gaze deviation. Postoperative changes to the globes. Visualized scalp soft tissues are within normal limits. ASPECTS [facility] Stroke Program Early CT Score) Total score (0-10 with 10 being normal): 10 IMPRESSION: 1. No acute cortically based infarct or acute intracranial hemorrhage identified. ASPECTS 10. 2. Moderately advanced cerebral white matter changes most commonly due to small vessel disease. Electronically Signed: By: Odessa Fleming M.D. On: 03/15/2023 11:04      Signature  -   Susa Raring M.D on 03/16/2023 at 8:28 AM   -  To page go to [email or web address]

## 2023-03-16 NOTE — Progress Notes (Signed)
LTM maint complete - no skin breakdown under: T7, F7

## 2023-03-16 NOTE — Evaluation (Signed)
Clinical/Bedside Swallow Evaluation Patient Details  Name: [name] [name] MRN: [id number] Date of Birth: Nov 02, 1945  Today's Date: 03/16/2023 Time: SLP Start Time (ACUTE ONLY): 0902 SLP Stop Time (ACUTE ONLY): 4098 SLP Time Calculation (min) (ACUTE ONLY): 21 min  Past Medical History:  Past Medical History:  Diagnosis Date   Adenomatous colon polyp    Aortic regurgitation    severe by TEE s/p pericardia tissue AVR   Arthritis    Cardiomyopathy (HCC)    EF initally 25% but now normalized   Carotid stenosis 08/17/2017   1-39% bilateral by dopplers 2022   Chronic diastolic CHF (congestive heart failure), NYHA class 2 (HCC)    Coronary artery disease 10/05/2013   Severe mid LAD stenosis s/p 1 vessel CABG with LIMA to LAD   Diabetes mellitus without complication (HCC)    DX 20 YRS AGO.   TYPE 2   Dilated aortic root (HCC)    GERD (gastroesophageal reflux disease)    Tums prn   Gout    Hypercholesteremia    LDL goal < 70   Hypertension    S/P Bio-Bentall aortic root replacement with bioprosthetic valve conduit 10/27/2013   23 mm [facility] Ease bovine pericardial tissue valve and 26 mm Vascutek gelweave Val-Salva aortic graft   S/P CABG x 1 10/27/2013   LIMA to LAD   Thyroiditis 10/2009   lab and u/s-thyroid function normalized in 9/11   Past Surgical History:  Past Surgical History:  Procedure Laterality Date   ABDOMINAL HYSTERECTOMY     AORTIC VALVE REPLACEMENT N/A 10/27/2013   Procedure: AORTIC VALVE REPLACEMENT (AVR);  Surgeon: Purcell Nails, MD;  Location: [facility] OR;  Service: Open Heart Surgery;  Laterality: N/A;   ASCENDING AORTIC ROOT REPLACEMENT N/A 10/27/2013   Procedure: ASCENDING AORTIC ROOT REPLACEMENT;  Surgeon: Purcell Nails, MD;  Location: [facility] OR;  Service: Open Heart Surgery;  Laterality: N/A;   BUNIONECTOMY     bilateral   CARDIAC CATHETERIZATION     CARDIAC VALVE REPLACEMENT     AORTIC VALVE   CARPAL TUNNEL RELEASE Right    CATARACT EXTRACTION Bilateral     COLONOSCOPY     COLONOSCOPY WITH PROPOFOL N/A 05/08/2015   Procedure: COLONOSCOPY WITH PROPOFOL;  Surgeon: Charolett Bumpers, MD;  Location: [facility] ENDOSCOPY;  Service: Endoscopy;  Laterality: N/A;   CORONARY ARTERY BYPASS GRAFT N/A 10/27/2013   Procedure: CORONARY ARTERY BYPASS GRAFTING (CABG) x 1 - LIMA to LAD;  Surgeon: Purcell Nails, MD;  Location: [facility] OR;  Service: Open Heart Surgery;  Laterality: N/A;   EYE SURGERY     BILATERAL CATARACTS   INTRAOPERATIVE TRANSESOPHAGEAL ECHOCARDIOGRAM N/A 10/27/2013   Procedure: INTRAOPERATIVE TRANSESOPHAGEAL ECHOCARDIOGRAM;  Surgeon: Purcell Nails, MD;  Location: [facility] OR;  Service: Open Heart Surgery;  Laterality: N/A;   JOINT REPLACEMENT     left total knee   KNEE ARTHROPLASTY Right 01/26/2017   Procedure: RIGHT TOTAL KNEE ARTHROPLASTY WITH COMPUTER NAVIGATION;  Surgeon: Samson Frederic, MD;  Location: [facility] OR;  Service: Orthopedics;  Laterality: Right;  Needs RNFA   KNEE ARTHROPLASTY Left 05/28/2017   Procedure: LEFT TOTAL KNEE ARTHROPLASTY WITH COMPUTER NAVIGATION;  Surgeon: Samson Frederic, MD;  Location: [facility] ORS;  Service: Orthopedics;  Laterality: Left;  Needs RNFA   LEFT AND RIGHT HEART CATHETERIZATION WITH CORONARY ANGIOGRAM N/A 10/05/2013   Procedure: LEFT AND RIGHT HEART CATHETERIZATION WITH CORONARY ANGIOGRAM;  Surgeon: Micheline Chapman, MD;  Location: [facility] CATH LAB;  Service: Cardiovascular;  Laterality:  N/A;   TEE WITHOUT CARDIOVERSION N/A 10/04/2013   Procedure: TRANSESOPHAGEAL ECHOCARDIOGRAM (TEE);  Surgeon: Quintella Reichert, MD;  Location: [facility] ENDOSCOPY;  Service: Cardiovascular;  Laterality: N/A;   tubular adenomatous polyp colonoscopy  12/31/2009   HPI:  77 y.o. female with PMH significant of coronary artery disease, chronic diastolic CHF, diabetes mellitus 2, HTN, hyperlipidemia AR s/p bioprosthetic valve repair, GERD, CABG in 2015 presented to ED with sudden confusion and aphasia this morning, slurred speech. MRI Motion degraded exam. EEG  unremarkable. Within this limitation, negative for an acute infarct. Per chart found to have severe toxic and metabolic encephalopathy present on admission.    Assessment / Plan / Recommendation  Clinical Impression  Pt was lethargic throughout assessment however awake enough to take liquids via spoon. She has normal dentition, unable to assess cough, volitional swallow or oromotor movements but noted to have right facial weakness. She had difficulty taking thick liquid via cup but able to consume with spoon. Decreased labial seal and right sided spillage with liquids. There was immediate cough with all spoon sips thin liquid that did not occur with spoon trials of nectar thick juice. Suspect delayed swallow initiation with trials. Pt had been given Ativan yesterday around 1 pm and is sleepy with current medical illness. As her alertness improves aspiration risk should decrease. Recommend initiating a Dys 1 (puree) diet, nectar thick liquids via spoon, crush meds and po's ONLY when adequately awake. ST will continue to follow. SLP Visit Diagnosis: Dysphagia, unspecified (R13.10)    Aspiration Risk  Mild aspiration risk;Moderate aspiration risk    Diet Recommendation Dysphagia 1 (Puree);Nectar-thick liquid    Liquid Administration via: Spoon Medication Administration: Crushed with puree Supervision: Staff to assist with self feeding;Full supervision/cueing for compensatory strategies Compensations: Minimize environmental distractions;Slow rate;Small sips/bites Postural Changes: Seated upright at 90 degrees    Other  Recommendations Oral Care Recommendations: Oral care BID    Recommendations for follow up therapy are one component of a multi-disciplinary discharge planning process, led by the attending physician.  Recommendations may be updated based on patient status, additional functional criteria and insurance authorization.  Follow up Recommendations  (TBD)      Assistance Recommended at  Discharge    Functional Status Assessment Patient has had a recent decline in their functional status and demonstrates the ability to make significant improvements in function in a reasonable and predictable amount of time.  Frequency and Duration min 2x/week  2 weeks       Prognosis Prognosis for improved oropharyngeal function: Fair Barriers to Reach Goals: Other (Comment) (alertness)      Swallow Study   General HPI: 77 y.o. female with PMH significant of coronary artery disease, chronic diastolic CHF, diabetes mellitus 2, HTN, hyperlipidemia AR s/p bioprosthetic valve repair, GERD, CABG in 2015 presented to ED with sudden confusion and aphasia this morning, slurred speech. MRI Motion degraded exam. EEG unremarkable. Within this limitation, negative for an acute infarct. Per chart found to have severe toxic and metabolic encephalopathy present on admission. Type of Study: Bedside Swallow Evaluation Previous Swallow Assessment:  (none) Diet Prior to this Study: NPO Temperature Spikes Noted: No Respiratory Status: Room air History of Recent Intubation: No Behavior/Cognition: Requires cueing;Cooperative;Lethargic/Drowsy Oral Cavity Assessment: Within Functional Limits Oral Care Completed by SLP: No Oral Cavity - Dentition: Adequate natural dentition Vision:  (mostly keep right eye closed) Self-Feeding Abilities: Needs assist Patient Positioning: Upright in bed Baseline Vocal Quality:  (no vocalizations) Volitional Cough: Cognitively unable to elicit  Volitional Swallow: Unable to elicit    Oral/Motor/Sensory Function Overall Oral Motor/Sensory Function: Mild impairment Facial ROM: Reduced right Facial Symmetry: Abnormal symmetry right Facial Strength: Reduced right Lingual ROM:  (did not protrude tongue)   Ice Chips Ice chips: Not tested   Thin Liquid Thin Liquid: Impaired Presentation: Spoon Oral Phase Impairments: Poor awareness of bolus;Reduced labial seal Oral Phase  Functional Implications: Right anterior spillage Pharyngeal  Phase Impairments: Cough - Immediate    Nectar Thick Nectar Thick Liquid: Impaired Presentation: Spoon Oral Phase Impairments: Reduced labial seal Oral phase functional implications: Right anterior spillage Pharyngeal Phase Impairments: Suspected delayed Swallow   Honey Thick Honey Thick Liquid: Not tested   Puree Puree: Impaired Presentation: Spoon Oral Phase Impairments: Reduced labial seal Oral Phase Functional Implications: Right anterior spillage   Solid     Solid: Not tested      Royce Macadamia 03/16/2023,9:53 AM

## 2023-03-16 NOTE — Progress Notes (Signed)
NEUROLOGY CONSULT FOLLOW UP NOTE   Date of service: March 16, 2023 Patient Name: [name] [name] MRN:  [id number] DOB:  October 25, 1945  Brief HPI  [name] [name] is a 77 y.o. female  has a past medical history of Adenomatous colon polyp, Aortic regurgitation, Arthritis, Cardiomyopathy (HCC), Carotid stenosis (08/17/2017), Chronic diastolic CHF (congestive heart failure), NYHA class 2 (HCC), Coronary artery disease (10/05/2013), Diabetes mellitus without complication (HCC), Dilated aortic root (HCC), GERD (gastroesophageal reflux disease), Gout, Hypercholesteremia, Hypertension, S/P Bio-Bentall aortic root replacement with bioprosthetic valve conduit (10/27/2013), S/P CABG x 1 (10/27/2013), and Thyroiditis (10/2009). who presented with  AMS and difficulty speaking. Concern for meningitis and was started on ABx    Interval Hx/subjective   Family at the bedside.  Speech therapy at the bedside. She was febrile overnight @ 102.2, WBC 12.8 UA negative.  UDS negative Patient has eyes closed. She opens eyes to stimulation. Does not follow commands or have any verbal output. Eyes are downward gaze, no blink on right, left blink to threat, looks left unable to get eyes to look right, right facial droop, moves bilateral uppers spontaneously, bilateral lowers withdraws  Vitals   Vitals:   03/16/23 0400 03/16/23 0430 03/16/23 0500 03/16/23 0900  BP: 130/71  134/62   Pulse: 70 82 76   Resp: (!) 22 16 (!) 24   Temp: 98.7 F (37.1 C) 99.9 F (37.7 C)  97.6 F (36.4 C)  TempSrc: Axillary Rectal  Axillary  SpO2: 94% 93% 94%   Weight:      Height:         Body mass index is 30.96 kg/m.  Physical Exam   Constitutional:  critically ill Psych: unable to assess Eyes: No scleral injection.  HENT: No OP obstrucion.  Head: Normocephalic.  Cardiovascular: Normal rate and regular rhythm.  Respiratory: Effort normal, non-labored breathing.  GI: Soft.  No distension. There is no tenderness.  Skin: WDI.    Neurologic Examination  Patient has eyes closed. She opens eyes to stimulation. Does not follow commands or have any verbal output. Eyes are downward gaze, no blink on right, left blink to threat, looks left unable to get eyes to look right, right facial droop, moves bilateral uppers spontaneously, bilateral lowers withdraws   Labs and Diagnostic Imaging   CBC:  Recent Labs  Lab 03/15/23 1027 03/15/23 1842 03/16/23 0459  WBC 9.9  --  12.8*  NEUTROABS 6.4  --   --   HGB 12.8 12.2 11.8*  HCT 37.5 36.0 34.9*  MCV 95.2  --  93.6  PLT 199  --  178    Basic Metabolic Panel:  Lab Results  Component Value Date   NA 134 (L) 03/15/2023   K 3.8 03/15/2023   CO2 27 03/15/2023   GLUCOSE 101 (H) 03/15/2023   BUN 8 03/15/2023   CREATININE 0.78 03/15/2023   CALCIUM 10.0 03/15/2023   GFRNONAA >60 03/15/2023   GFRAA >60 05/29/2017   Lipid Panel:  Lab Results  Component Value Date   LDLCALC 31 03/16/2023   HgbA1c:  Lab Results  Component Value Date   HGBA1C 7.1 (H) 03/16/2023   Urine Drug Screen:     Component Value Date/Time   LABOPIA NONE DETECTED 03/15/2023 1810   COCAINSCRNUR NONE DETECTED 03/15/2023 1810   LABBENZ NONE DETECTED 03/15/2023 1810   AMPHETMU NONE DETECTED 03/15/2023 1810   THCU NONE DETECTED 03/15/2023 1810   LABBARB NONE DETECTED 03/15/2023 1810    Alcohol Level  Component Value Date/Time   ETH <10 03/15/2023 1027   INR  Lab Results  Component Value Date   INR 1.1 03/15/2023   APTT  Lab Results  Component Value Date   APTT 31 03/15/2023   AED levels: No results found for: "PHENYTOIN", "ZONISAMIDE", "LAMOTRIGINE", "LEVETIRACETA"  CT Head without contrast(Personally reviewed):  No acute cortically based infarct or acute intracranial hemorrhage identified. ASPECTS 10.  CT angio Head and Neck with contrast(Personally reviewed): Negative for large vessel occlusion. ICA siphon calcified atherosclerosis with up to Moderate bilateral siphon  stenosis.  MRI Brain(Personally reviewed): Motion degraded exam. Within this limitation, negative for an acute Infarct. Small bilateral mastoid effusions.   cEEG: 12/8-12/9  This study showed evidence of epileptogenicity arising from left frontal region. Additionally there was cortical dysfunction arising from left hemisphere likely secondary to underlying structural abnormality, post-ictal state. No seizures or epileptiform discharges were seen throughout the recording.   Labs UA negative.  UDS negative WBC 12.8 TSH 1.086 Vit B12 117 Procal, CRP negative  Folate 38.6 Ammonia 19 Thiamine pending   Impression   [name] [name] is a 77 y.o. female  PMH significant of coronary artery disease, chronic diastolic CHF, diabetes mellitus 2, HTN, hyperlipidemia AR s/p bioprosthetic valve repair, CABG in 2015 presented to ED with sudden confusion and aphasia this morning. MRI brain negative for acute process, however noted to have Small bilateral mastoid effusions. Concern for meningitis and will need to have LP    Recommendations  -seizure precautions - continue LTM -Increase Keppra to 500 twice daily - Will need LP today at the bedside.  - CSF labs: protein, glucose cell count x2, culture with gram stain, ME panel  - continue Abx for meningitis coverage  -Neurology will continue to follow  ______________________________________________________________________   Thank you for the opportunity to take part in the care of this patient. If you have any further questions, please contact the neurology consultation team on call. Updated oncall schedule is listed on AMION.  Signed,  Gevena Mart DNP, ACNPC-AG  [facility]  I have seen the patient and reviewed the above note.  Agree with need for LP to rule out CNS infection given negative MRI and EEG with focal symptoms of aphasia with right facial weakness, possible right arm weakness.  I continue to think stroke may be in the  differential, and if LP does not give a more clear diagnosis, then would favor repeating MRI.  She does have evidence of seizure predisposition on EEG and I would favor using a more therapeutic dose of Keppra.  Neurology will continue to follow.  Ritta Slot, MD [facility] [phone]  If 7pm- 7am, please page neurology on call as listed in AMION.

## 2023-03-16 NOTE — Evaluation (Signed)
Physical Therapy Evaluation Patient Details Name: [name] MRN: [id number] DOB: 1945-06-26 Today's Date: 03/16/2023  History of Present Illness  Pt is a 77 y.o. female who presented to ED with c/o slurred speech, confusion, and R facial droop. CT and MRI negative. Pt admitted for acute encephalopathy. PMH:  CAD, chronic diastolic CHF, DM2, HTN, hyperlipidemia AR s/p bioprosthetic valve repair, CABG in 2015   Clinical Impression  Pt admitted with above diagnosis. PTA pt lived at home with her husband, independent with occasional use of cane. Pt currently with functional limitations due to the deficits listed below (see PT Problem List). On eval, she required mod assist bed mobility, and mod assist sit to stand. Pt fidgety and difficult to redirect. Not following simple commands. Actively participating in all functional mobility skills after therapist initiation. Eyes open with good eye contact but nonverbal. Pt undergoing LTM EEG. Pt will benefit from acute skilled PT to increase their independence and safety with mobility to allow discharge. Upon d/c, pt would benefit from further therapy in inpatient setting < 3 hours/day. As cognition improves, pt demo good potential for quick mobility progression.          If plan is discharge home, recommend the following: Two people to help with walking and/or transfers;Two people to help with bathing/dressing/bathroom   Can travel by private vehicle   Yes    Equipment Recommendations Other (comment) (TBD)  Recommendations for Other Services       Functional Status Assessment Patient has had a recent decline in their functional status and demonstrates the ability to make significant improvements in function in a reasonable and predictable amount of time.     Precautions / Restrictions Precautions Precautions: Fall;Other (comment) Precaution Comments: undergoing LTM EEG at time of eval      Mobility  Bed Mobility Overal bed mobility:  Needs Assistance Bed Mobility: Supine to Sit, Sit to Supine     Supine to sit: Mod assist, HOB elevated, Used rails Sit to supine: Mod assist   General bed mobility comments: assist with trunk and BLE. After therapist initiated movement, pt able to actively participate in remaining steps/sequence with cues.    Transfers Overall transfer level: Needs assistance Equipment used: 1 person hand held assist Transfers: Sit to/from Stand Sit to Stand: Mod assist           General transfer comment: STS from EOB with therapist anterior to pt providing assist to power up at gait belt.    Ambulation/Gait               General Gait Details: deferred due to cognition and undergoing EEG  Stairs            Wheelchair Mobility     Tilt Bed    Modified Rankin (Stroke Patients Only)       Balance Overall balance assessment: Needs assistance Sitting-balance support: Single extremity supported, Feet supported Sitting balance-Leahy Scale: Fair     Standing balance support: Bilateral upper extremity supported, During functional activity Standing balance-Leahy Scale: Poor Standing balance comment: reliant on external support. Static stand x 10 seconds mod assist.                             Pertinent Vitals/Pain Pain Assessment Pain Assessment: CPOT Facial Expression: Relaxed, neutral Body Movements: Absence of movements Muscle Tension: Relaxed Compliance with ventilator (intubated pts.): N/A Vocalization (extubated pts.): N/A CPOT Total: 0  Home Living Family/patient expects to be discharged to:: Private residence Living Arrangements: Spouse/significant other Available Help at Discharge: Available 24 hours/day;Family Type of Home: House Home Access: Stairs to enter Entrance Stairs-Rails: None Entrance Stairs-Number of Steps: 2   Home Layout: One level Home Equipment: Teacher, English as a foreign language (2 wheels);Cane - single point;Shower seat Additional  Comments: Was active with OPPT at time of admission due to back and RLE pain.    Prior Function Prior Level of Function : Independent/Modified Independent;Driving             Mobility Comments: occasional use of SPC       Extremity/Trunk Assessment   Upper Extremity Assessment Upper Extremity Assessment: Defer to OT evaluation    Lower Extremity Assessment Lower Extremity Assessment: Generalized weakness (able to actively move through full range. Assist with BLE in/out  of bed)    Cervical / Trunk Assessment Cervical / Trunk Assessment: Normal  Communication   Communication Communication: Difficulty following commands/understanding;Difficulty communicating thoughts/reduced clarity of speech  Cognition Arousal: Alert Behavior During Therapy: Impulsive, Restless, Flat affect Overall Cognitive Status: Difficult to assess                                 General Comments: Fidgety. Nonverbal. Not following commands. Eyes open throughout session. Easily distracted and difficult to redirect.        General Comments General comments (skin integrity, edema, etc.): Pt given ativan yesterday. Suspect meds still in system.    Exercises     Assessment/Plan    PT Assessment Patient needs continued PT services  PT Problem List Decreased strength;Decreased balance;Decreased cognition;Decreased mobility;Decreased knowledge of use of DME;Decreased activity tolerance       PT Treatment Interventions DME instruction;Functional mobility training;Balance training;Patient/family education;Gait training;Therapeutic activities;Neuromuscular re-education;Therapeutic exercise;Cognitive remediation;Stair training    PT Goals (Current goals can be found in the Care Plan section)  Acute Rehab PT Goals Patient Stated Goal: home per family PT Goal Formulation: With patient/family Time For Goal Achievement: 03/30/23 Potential to Achieve Goals: Good    Frequency Min 1X/week      Co-evaluation               AM-PAC PT "6 Clicks" Mobility  Outcome Measure Help needed turning from your back to your side while in a flat bed without using bedrails?: A Lot Help needed moving from lying on your back to sitting on the side of a flat bed without using bedrails?: A Lot Help needed moving to and from a bed to a chair (including a wheelchair)?: Total Help needed standing up from a chair using your arms (e.g., wheelchair or bedside chair)?: A Lot Help needed to walk in hospital room?: Total Help needed climbing 3-5 steps with a railing? : Total 6 Click Score: 9    End of Session Equipment Utilized During Treatment: Gait belt Activity Tolerance: Patient tolerated treatment well Patient left: in bed;with call bell/phone within reach;with bed alarm set;with family/visitor present Nurse Communication: Mobility status PT Visit Diagnosis: Other abnormalities of gait and mobility (R26.89)    Time: 9518-8416 PT Time Calculation (min) (ACUTE ONLY): 19 min   Charges:   PT Evaluation $PT Eval Moderate Complexity: 1 Mod   PT General Charges $$ ACUTE PT VISIT: 1 Visit         Ferd Glassing., PT  Office # [phone]   Ilda Foil 03/16/2023, 9:40 AM

## 2023-03-16 NOTE — Progress Notes (Signed)
Echocardiogram 2D Echocardiogram has been performed.  [name] [name] 03/16/2023, 10:57 AM

## 2023-03-16 NOTE — Procedures (Signed)
Patient Name: [name] [name]  MRN: [id number]  Epilepsy Attending: Charlsie Quest  Referring Physician/Provider: Gordy Councilman, MD  Duration: 03/15/2023 1516 to 03/16/2023 2130  Patient history: 77yo F with aphasia getting eeg to evaluate for seizure  Level of alertness: Awake, asleep  AEDs during EEG study: LEV  Technical aspects: This EEG study was done with scalp electrodes positioned according to the 10-20 International system of electrode placement. Electrical activity was reviewed with band pass filter of 1-70Hz , sensitivity of 7 uV/mm, display speed of 10mm/sec with a 60Hz  notched filter applied as appropriate. EEG data were recorded continuously and digitally stored.  Video monitoring was available and reviewed as appropriate.  Description: The posterior dominant rhythm consists of 8 Hz activity of moderate voltage (25-35 uV) seen predominantly in posterior head regions, asymmetric ( left < right)and reactive to eye opening and eye closing. Sleep was characterized by vertex waves, sleep spindles (12 to 14 Hz), maximal frontocentral region. EEG showed continuous 2 to 3 Hz delta slowing in left hemisphere. Sharp waves were noted in left frontal region. Hyperventilation and photic stimulation were not performed.     EEG was disconnected between 03/15/2023 1923 to 03/16/2023 0114 due to room transfer  ABNORMALITY -Sharp wave, left frontal region.  - Continuous slow, left hemisphere  IMPRESSION: This study showed evidence of epileptogenicity arising from left frontal region. Additionally there was cortical dysfunction arising from left hemisphere likely secondary to underlying structural abnormality, post-ictal state. No seizures were seen throughout the recording.  Lindwood Mogel Annabelle Harman

## 2023-03-16 NOTE — Evaluation (Signed)
Occupational Therapy Evaluation Patient Details Name: [name] [name] MRN: [id number] DOB: 24-Jun-1945 Today's Date: 03/16/2023   History of Present Illness Pt is a 77 y.o. female who presented to ED with c/o slurred speech, confusion, and R facial droop. CT and MRI negative. Pt admitted for acute encephalopathy. PMH:  CAD, chronic diastolic CHF, DM2, HTN, hyperlipidemia AR s/p bioprosthetic valve repair, CABG in 2015   Clinical Impression   Prior to this admission, patient living with her husband, and independent with ADLs and occasionally using a cane for functional mobility. Currently, patient presenting with confusion, decreased command following, lethargy, and need for increased assist in order to complete ADLs and functional mobility. Patient would alert to voice, and would follow one-step commands 50% of the time with increased time for processing. Patient able to complete bed mobility at mod A, and then able to stand EOB with mod A, but fatiguing quickly. Patient requiring mod to max A for ADL management. OT recommending rehab at a lesser intensive setting (< 3 hours), OT will continue to follow acutely.      If plan is discharge home, recommend the following: Two people to help with walking and/or transfers;A lot of help with bathing/dressing/bathroom;Assistance with cooking/housework;Direct supervision/assist for medications management;Direct supervision/assist for financial management;Assistance with feeding;Assist for transportation;Help with stairs or ramp for entrance;Supervision due to cognitive status    Functional Status Assessment  Patient has had a recent decline in their functional status and demonstrates the ability to make significant improvements in function in a reasonable and predictable amount of time.  Equipment Recommendations  Other (comment) (defer to next venue)    Recommendations for Other Services       Precautions / Restrictions Precautions Precautions:  Fall;Other (comment) Precaution Comments: undergoing LTM EEG at time of eval Restrictions Weight Bearing Restrictions: No      Mobility Bed Mobility Overal bed mobility: Needs Assistance Bed Mobility: Supine to Sit, Sit to Supine     Supine to sit: Mod assist, HOB elevated, Used rails Sit to supine: Mod assist   General bed mobility comments: assist with trunk and BLE. After therapist initiated movement, pt able to actively participate in remaining steps/sequence with cues.    Transfers Overall transfer level: Needs assistance Equipment used: 1 person hand held assist Transfers: Sit to/from Stand Sit to Stand: Mod assist           General transfer comment: STS from EOB with therapist anterior to pt providing assist to power up at gait belt.      Balance Overall balance assessment: Needs assistance Sitting-balance support: Single extremity supported, Feet supported Sitting balance-Leahy Scale: Fair     Standing balance support: Bilateral upper extremity supported, During functional activity Standing balance-Leahy Scale: Poor Standing balance comment: reliant on external support. Static stand x 10 seconds mod assist.                           ADL either performed or assessed with clinical judgement   ADL Overall ADL's : Needs assistance/impaired Eating/Feeding: Moderate assistance;Sitting   Grooming: Moderate assistance;Sitting;Maximal assistance   Upper Body Bathing: Maximal assistance;Sitting   Lower Body Bathing: Maximal assistance;Total assistance;Sitting/lateral leans;Sit to/from stand   Upper Body Dressing : Moderate assistance;Sitting   Lower Body Dressing: Maximal assistance;Total assistance;Sitting/lateral leans;Sit to/from stand Lower Body Dressing Details (indicate cue type and reason): OT donning socks in bed Toilet Transfer: Moderate assistance;BSC/3in1;Cueing for sequencing;Cueing for safety;Stand-pivot Toilet Transfer Details (indicate  cue  type and reason): patient able to power up into standing, fatiguing relatively quickly and needing to sit down Toileting- Clothing Manipulation and Hygiene: Moderate assistance;Maximal assistance;Bed level       Functional mobility during ADLs: Moderate assistance;+2 for physical assistance;+2 for safety/equipment;Cueing for safety;Cueing for sequencing General ADL Comments: Patient presenting with confusion, decreased command following, lethargy, and need for increased assist in order to complete ADLs and functional mobility. Patient would alert to voice, and would follow one-step commands 50% of the time with increased time for processing. Patient able to complete bed mobility at mod A, and then able to stand EOB with mod A, but fatiguing quickly. Patient requiring mod to max A for ADL management. OT recommending rehab at a lesser intensive setting (< 3 hours), OT will continue to follow acutely.     Vision Baseline Vision/History: 1 Wears glasses Ability to See in Adequate Light: 0 Adequate Patient Visual Report: No change from baseline Additional Comments: Will continue to assess when more alert and cognitively intact     Perception Perception: Not tested       Praxis Praxis: Not tested       Pertinent Vitals/Pain Pain Assessment Pain Assessment: Faces Faces Pain Scale: Hurts a little bit Facial Expression: Grimacing Body Movements: Restlessness Muscle Tension: Relaxed Compliance with ventilator (intubated pts.): N/A Vocalization (extubated pts.): Talking in normal tone or no sound CPOT Total: 4 Pain Location: grimacing, but would deny pain, could not localize despite increased prompting Pain Descriptors / Indicators: Grimacing Pain Intervention(s): Limited activity within patient's tolerance, Monitored during session, Repositioned     Extremity/Trunk Assessment Upper Extremity Assessment Upper Extremity Assessment: Generalized weakness;Difficult to assess due to  impaired cognition   Lower Extremity Assessment Lower Extremity Assessment: Defer to PT evaluation   Cervical / Trunk Assessment Cervical / Trunk Assessment: Normal   Communication Communication Communication: Difficulty following commands/understanding;Difficulty communicating thoughts/reduced clarity of speech Following commands: Follows one step commands with increased time Cueing Techniques: Verbal cues;Gestural cues;Tactile cues;Visual cues   Cognition Arousal: Lethargic Behavior During Therapy: Impulsive, Restless, Flat affect Overall Cognitive Status: Impaired/Different from baseline Area of Impairment: Orientation, Attention, Memory, Following commands, Safety/judgement, Awareness, Problem solving                 Orientation Level: Place, Time, Situation Current Attention Level: Focused Memory: Decreased recall of precautions, Decreased short-term memory Following Commands: Follows one step commands inconsistently Safety/Judgement: Decreased awareness of safety, Decreased awareness of deficits Awareness: Intellectual Problem Solving: Slow processing, Decreased initiation, Difficulty sequencing, Requires verbal cues, Requires tactile cues General Comments: Would utter a few words with increased time, would follow commands 50% of the time with increased time to process, pulling at lines and leads, restless     General Comments  Patient beginning to clear ativan, education provided on importance of mits if family were to leave as patient is beginning to attempt to pull at lines and leads    Exercises     Shoulder Instructions      Home Living Family/patient expects to be discharged to:: Private residence Living Arrangements: Spouse/significant other Available Help at Discharge: Available 24 hours/day;Family Type of Home: House Home Access: Stairs to enter [employer] of Steps: 2 Entrance Stairs-Rails: None Home Layout: One level     Bathroom  Shower/Tub: Producer, television/film/video: Standard     Home Equipment: Teacher, English as a foreign language (2 wheels);Cane - single point;Shower seat   Additional Comments: Was active with OPPT at time of admission due to  back and RLE pain.      Prior Functioning/Environment Prior Level of Function : Independent/Modified Independent;Driving             Mobility Comments: occasional use of SPC ADLs Comments: independent        OT Problem List: Decreased strength;Decreased range of motion;Decreased activity tolerance;Impaired balance (sitting and/or standing);Decreased coordination;Decreased cognition;Decreased safety awareness;Decreased knowledge of use of DME or AE;Decreased knowledge of precautions      OT Treatment/Interventions: Therapeutic exercise;Self-care/ADL training;Neuromuscular education;Energy conservation;DME and/or AE instruction;Manual therapy;Therapeutic activities;Cognitive remediation/compensation;Patient/family education;Balance training    OT Goals(Current goals can be found in the care plan section) Acute Rehab OT Goals Patient Stated Goal: unable to state OT Goal Formulation: Patient unable to participate in goal setting Time For Goal Achievement: 03/30/23 Potential to Achieve Goals: Fair ADL Goals Pt Will Perform Lower Body Bathing: with min assist;sitting/lateral leans;sit to/from stand Pt Will Perform Lower Body Dressing: sitting/lateral leans;sit to/from stand;with min assist Pt Will Transfer to Toilet: with min assist;regular height toilet;ambulating Pt Will Perform Toileting - Clothing Manipulation and hygiene: with min assist;sitting/lateral leans;sit to/from stand Additional ADL Goal #1: Patient will be able to follow 2 step commands consistently as a precursor to upper level cognitive tasks. Additional ADL Goal #2: Patient will be able to complete bed mobility at a supervision level as a precursor to OOB activities.  OT Frequency: Min 1X/week     Co-evaluation              AM-PAC OT "6 Clicks" Daily Activity     Outcome Measure Help from another person eating meals?: A Lot Help from another person taking care of personal grooming?: A Lot Help from another person toileting, which includes using toliet, bedpan, or urinal?: A Lot Help from another person bathing (including washing, rinsing, drying)?: A Lot Help from another person to put on and taking off regular upper body clothing?: A Lot Help from another person to put on and taking off regular lower body clothing?: A Lot 6 Click Score: 12   End of Session Nurse Communication: Mobility status  Activity Tolerance: Patient limited by fatigue Patient left: in bed;with call bell/phone within reach;with bed alarm set;with family/visitor present  OT Visit Diagnosis: Unsteadiness on feet (R26.81);Other abnormalities of gait and mobility (R26.89);Other symptoms and signs involving cognitive function;Pain                Time: 1250-1309 OT Time Calculation (min): 19 min Charges:  OT General Charges $OT Visit: 1 Visit OT Evaluation $OT Eval Moderate Complexity: 1 Mod  Pollyann Glen E. Meika Earll, OTR/L Acute Rehabilitation Services [phone]   Cherlyn Cushing 03/16/2023, 2:58 PM

## 2023-03-17 ENCOUNTER — Inpatient Hospital Stay (HOSPITAL_COMMUNITY): Payer: Medicare PPO

## 2023-03-17 DIAGNOSIS — R569 Unspecified convulsions: Secondary | ICD-10-CM | POA: Diagnosis not present

## 2023-03-17 LAB — CBC WITH DIFFERENTIAL/PLATELET
Abs Immature Granulocytes: 0.02 10*3/uL (ref 0.00–0.07)
Basophils Absolute: 0.1 10*3/uL (ref 0.0–0.1)
Basophils Relative: 1 %
Eosinophils Absolute: 0.4 10*3/uL (ref 0.0–0.5)
Eosinophils Relative: 4 %
HCT: 36.9 % (ref 36.0–46.0)
Hemoglobin: 12.5 g/dL (ref 12.0–15.0)
Immature Granulocytes: 0 %
Lymphocytes Relative: 32 %
Lymphs Abs: 2.8 10*3/uL (ref 0.7–4.0)
MCH: 31.8 pg (ref 26.0–34.0)
MCHC: 33.9 g/dL (ref 30.0–36.0)
MCV: 93.9 fL (ref 80.0–100.0)
Monocytes Absolute: 0.8 10*3/uL (ref 0.1–1.0)
Monocytes Relative: 9 %
Neutro Abs: 4.8 10*3/uL (ref 1.7–7.7)
Neutrophils Relative %: 54 %
Platelets: 166 10*3/uL (ref 150–400)
RBC: 3.93 MIL/uL (ref 3.87–5.11)
RDW: 12.8 % (ref 11.5–15.5)
WBC: 8.9 10*3/uL (ref 4.0–10.5)
nRBC: 0 % (ref 0.0–0.2)

## 2023-03-17 LAB — BASIC METABOLIC PANEL
Anion gap: 13 (ref 5–15)
BUN: 7 mg/dL — ABNORMAL LOW (ref 8–23)
CO2: 21 mmol/L — ABNORMAL LOW (ref 22–32)
Calcium: 8.8 mg/dL — ABNORMAL LOW (ref 8.9–10.3)
Chloride: 103 mmol/L (ref 98–111)
Creatinine, Ser: 0.85 mg/dL (ref 0.44–1.00)
GFR, Estimated: >60 mL/min (ref >60)
Glucose, Bld: 95 mg/dL (ref 70–99)
Potassium: 3.4 mmol/L — ABNORMAL LOW (ref 3.5–5.1)
Sodium: 137 mmol/L (ref 135–145)

## 2023-03-17 LAB — PROCALCITONIN: Procalcitonin: <0.1 ng/mL

## 2023-03-17 LAB — MAGNESIUM: Magnesium: 1.3 mg/dL — ABNORMAL LOW (ref 1.7–2.4)

## 2023-03-17 LAB — GLUCOSE, CAPILLARY
Glucose-Capillary: 83 mg/dL (ref 70–99)
Glucose-Capillary: 152 mg/dL — ABNORMAL HIGH (ref 70–99)
Glucose-Capillary: 187 mg/dL — ABNORMAL HIGH (ref 70–99)
Glucose-Capillary: 104 mg/dL — ABNORMAL HIGH (ref 70–99)
Glucose-Capillary: 231 mg/dL — ABNORMAL HIGH (ref 70–99)
Glucose-Capillary: 193 mg/dL — ABNORMAL HIGH (ref 70–99)

## 2023-03-17 LAB — C-REACTIVE PROTEIN: CRP: 2 mg/dL — ABNORMAL HIGH (ref <1.0)

## 2023-03-17 LAB — BRAIN NATRIURETIC PEPTIDE: B Natriuretic Peptide: 167.1 pg/mL — ABNORMAL HIGH (ref 0.0–100.0)

## 2023-03-17 MED ORDER — LEVETIRACETAM 500 MG PO TABS
750.0000 mg | ORAL_TABLET | Freq: Two times a day (BID) | ORAL | Status: DC
  Administered 2023-03-18 – 2023-03-20 (×5): 750 mg via ORAL
  Filled 2023-03-17 (×6): qty 1

## 2023-03-17 MED ORDER — POTASSIUM CHLORIDE 10 MEQ/100ML IV SOLN
10.0000 meq | INTRAVENOUS | Status: AC
  Administered 2023-03-17 (×4): 10 meq via INTRAVENOUS
  Filled 2023-03-17 (×3): qty 100

## 2023-03-17 MED ORDER — MAGNESIUM SULFATE 4 GM/100ML IV SOLN
4.0000 g | Freq: Once | INTRAVENOUS | Status: AC
  Administered 2023-03-17: 4 g via INTRAVENOUS
  Filled 2023-03-17: qty 100

## 2023-03-17 MED ORDER — SODIUM CHLORIDE 0.9 % IV SOLN
750.0000 mg | Freq: Two times a day (BID) | INTRAVENOUS | Status: DC
  Administered 2023-03-17: 750 mg via INTRAVENOUS
  Filled 2023-03-17 (×7): qty 7.5

## 2023-03-17 MED ORDER — GADOBUTROL 1 MMOL/ML IV SOLN
7.0000 mL | Freq: Once | INTRAVENOUS | Status: AC | PRN
  Administered 2023-03-17: 7 mL via INTRAVENOUS

## 2023-03-17 NOTE — Plan of Care (Signed)
  Problem: Education: Goal: Knowledge of General Education information will improve Description: Including pain rating scale, medication(s)/side effects and non-pharmacologic comfort measures Outcome: Progressing   Problem: Health Behavior/Discharge Planning: Goal: Ability to manage health-related needs will improve Outcome: Progressing   Problem: Clinical Measurements: Goal: Ability to maintain clinical measurements within normal limits will improve Outcome: Progressing Goal: Will remain free from infection Outcome: Progressing Goal: Diagnostic test results will improve Outcome: Progressing Goal: Respiratory complications will improve Outcome: Progressing Goal: Cardiovascular complication will be avoided Outcome: Progressing   Problem: Activity: Goal: Risk for activity intolerance will decrease Outcome: Progressing   Problem: Nutrition: Goal: Adequate nutrition will be maintained Outcome: Progressing   Problem: Coping: Goal: Level of anxiety will decrease Outcome: Progressing   Problem: Pain Management: Goal: General experience of comfort will improve Outcome: Progressing   Problem: Safety: Goal: Ability to remain free from injury will improve Outcome: Progressing   Problem: Skin Integrity: Goal: Risk for impaired skin integrity will decrease Outcome: Progressing   Problem: Coping: Goal: Ability to adjust to condition or change in health will improve Outcome: Progressing   Problem: Fluid Volume: Goal: Ability to maintain a balanced intake and output will improve Outcome: Progressing   Problem: Health Behavior/Discharge Planning: Goal: Ability to identify and utilize available resources and services will improve Outcome: Progressing Goal: Ability to manage health-related needs will improve Outcome: Progressing   Problem: Metabolic: Goal: Ability to maintain appropriate glucose levels will improve Outcome: Progressing

## 2023-03-17 NOTE — Progress Notes (Signed)
Speech Language Pathology Treatment: Dysphagia  Patient Details Name: [name] [name] MRN: [id number] DOB: Aug 27, 1945 Today's Date: 03/17/2023 Time: 4098-1191 SLP Time Calculation (min) (ACUTE ONLY): 12 min  Assessment / Plan / Recommendation Clinical Impression  Pt with significant improvements in her alertness and communication. Observed with nectar, upgraded trials thin and regular texture. She masticated graham cracker timely and completely cleared oral cavity across trials. Swallow initiation with cup and straw sips thin did not appear to be delayed, swallow palpable and no s/s aspiration for multiple trials throughout session. Pt able to upgrade to regular texture, family is always present and given a menu to order food if desired. Upgrade liquids to thin and allowed to use straws, pills with thin (if difficulty can give whole in puree). ST will follow x 1 for safety and efficiency with recommendations. Continue to see for cognition.    HPI HPI: 77 y.o. female with PMH significant of coronary artery disease, chronic diastolic CHF, diabetes mellitus 2, HTN, hyperlipidemia AR s/p bioprosthetic valve repair, GERD, CABG in 2015 presented to ED with sudden confusion and aphasia this morning, slurred speech. MRI Motion degraded exam. EEG unremarkable. Within this limitation, negative for an acute infarct. Per chart found to have severe toxic and metabolic encephalopathy present on admission.      SLP Plan  Continue with current plan of care      Recommendations for follow up therapy are one component of a multi-disciplinary discharge planning process, led by the attending physician.  Recommendations may be updated based on patient status, additional functional criteria and insurance authorization.    Recommendations  Diet recommendations: Regular;Thin liquid Liquids provided via: Cup;Straw Medication Administration: Whole meds with liquid Supervision: Patient able to self  feed Compensations: Slow rate;Small sips/bites Postural Changes and/or Swallow Maneuvers: Seated upright 90 degrees                  Oral care BID   Set up Supervision/Assistance Dysphagia, unspecified (R13.10)     Continue with current plan of care     Royce Macadamia  03/17/2023, 10:35 AM

## 2023-03-17 NOTE — Progress Notes (Signed)
LTM EEG running - no initial skin breakdown - push button tested - neuro notified. [facility] NOTIFIED Hu charge captured

## 2023-03-17 NOTE — Evaluation (Signed)
Speech Language Pathology Evaluation Patient Details Name: [name] [name] MRN: [id number] DOB: [birth year] Today's Date: 03/17/2023 Time: 4782-9562 SLP Time Calculation (min) (ACUTE ONLY): 12 min  Problem List:  Patient Active Problem List   Diagnosis Date Noted   TIA (transient ischemic attack) 03/15/2023   Seizure (HCC) 03/15/2023   Controlled type 2 diabetes mellitus without complication, without long-term current use of insulin (HCC) 03/15/2023   Carotid stenosis 08/17/2017   Osteoarthritis of left knee 05/28/2017   Osteoarthritis of right knee 01/26/2017   Leg cramps 07/02/2015   Dyslipidemia, goal LDL below 70 10/21/2014   S/P Bio-Bentall aortic root replacement with bioprosthetic valve conduit 10/27/2013   S/P CABG x 1 10/27/2013   Aortic insufficiency 10/10/2013   Chronic diastolic CHF (congestive heart failure), NYHA class 2 (HCC)    Coronary artery disease 10/05/2013   PVC (premature ventricular contraction) 03/30/2013   Essential hypertension, benign 03/30/2013   Past Medical History:  Past Medical History:  Diagnosis Date   Adenomatous colon polyp    Aortic regurgitation    severe by TEE s/p pericardia tissue AVR   Arthritis    Cardiomyopathy (HCC)    EF initally 25% but now normalized   Carotid stenosis 08/17/2017   1-39% bilateral by dopplers 2022   Chronic diastolic CHF (congestive heart failure), NYHA class 2 (HCC)    Coronary artery disease 10/05/2013   Severe mid LAD stenosis s/p 1 vessel CABG with LIMA to LAD   Diabetes mellitus without complication (HCC)    DX 20 YRS AGO.   TYPE 2   Dilated aortic root (HCC)    GERD (gastroesophageal reflux disease)    Tums prn   Gout    Hypercholesteremia    LDL goal < 70   Hypertension    S/P Bio-Bentall aortic root replacement with bioprosthetic valve conduit 10/27/2013   23 mm [facility] Ease bovine pericardial tissue valve and 26 mm Vascutek gelweave Val-Salva aortic graft   S/P CABG x 1 10/27/2013   LIMA  to LAD   Thyroiditis 10/2009   lab and u/s-thyroid function normalized in 9/11   Past Surgical History:  Past Surgical History:  Procedure Laterality Date   ABDOMINAL HYSTERECTOMY     AORTIC VALVE REPLACEMENT N/A 10/27/2013   Procedure: AORTIC VALVE REPLACEMENT (AVR);  Surgeon: Purcell Nails, MD;  Location: [facility] OR;  Service: Open Heart Surgery;  Laterality: N/A;   ASCENDING AORTIC ROOT REPLACEMENT N/A 10/27/2013   Procedure: ASCENDING AORTIC ROOT REPLACEMENT;  Surgeon: Purcell Nails, MD;  Location: [facility] OR;  Service: Open Heart Surgery;  Laterality: N/A;   BUNIONECTOMY     bilateral   CARDIAC CATHETERIZATION     CARDIAC VALVE REPLACEMENT     AORTIC VALVE   CARPAL TUNNEL RELEASE Right    CATARACT EXTRACTION Bilateral    COLONOSCOPY     COLONOSCOPY WITH PROPOFOL N/A 05/08/2015   Procedure: COLONOSCOPY WITH PROPOFOL;  Surgeon: Charolett Bumpers, MD;  Location: [facility] ENDOSCOPY;  Service: Endoscopy;  Laterality: N/A;   CORONARY ARTERY BYPASS GRAFT N/A 10/27/2013   Procedure: CORONARY ARTERY BYPASS GRAFTING (CABG) x 1 - LIMA to LAD;  Surgeon: Purcell Nails, MD;  Location: [facility] OR;  Service: Open Heart Surgery;  Laterality: N/A;   EYE SURGERY     BILATERAL CATARACTS   INTRAOPERATIVE TRANSESOPHAGEAL ECHOCARDIOGRAM N/A 10/27/2013   Procedure: INTRAOPERATIVE TRANSESOPHAGEAL ECHOCARDIOGRAM;  Surgeon: Purcell Nails, MD;  Location: [facility] OR;  Service: Open Heart Surgery;  Laterality: N/A;  JOINT REPLACEMENT     left total knee   KNEE ARTHROPLASTY Right 01/26/2017   Procedure: RIGHT TOTAL KNEE ARTHROPLASTY WITH COMPUTER NAVIGATION;  Surgeon: Samson Frederic, MD;  Location: [facility] OR;  Service: Orthopedics;  Laterality: Right;  Needs RNFA   KNEE ARTHROPLASTY Left 05/28/2017   Procedure: LEFT TOTAL KNEE ARTHROPLASTY WITH COMPUTER NAVIGATION;  Surgeon: Samson Frederic, MD;  Location: [facility] ORS;  Service: Orthopedics;  Laterality: Left;  Needs RNFA   LEFT AND RIGHT HEART CATHETERIZATION WITH CORONARY ANGIOGRAM N/A  10/05/2013   Procedure: LEFT AND RIGHT HEART CATHETERIZATION WITH CORONARY ANGIOGRAM;  Surgeon: Micheline Chapman, MD;  Location: [facility] CATH LAB;  Service: Cardiovascular;  Laterality: N/A;   TEE WITHOUT CARDIOVERSION N/A 10/04/2013   Procedure: TRANSESOPHAGEAL ECHOCARDIOGRAM (TEE);  Surgeon: Quintella Reichert, MD;  Location: [facility] ENDOSCOPY;  Service: Cardiovascular;  Laterality: N/A;   tubular adenomatous polyp colonoscopy  12/31/2009   HPI:  77 y.o. female with PMH significant of coronary artery disease, chronic diastolic CHF, diabetes mellitus 2, HTN, hyperlipidemia AR s/p bioprosthetic valve repair, GERD, CABG in 2015 presented to ED with sudden confusion and aphasia this morning, slurred speech. MRI Motion degraded exam. EEG unremarkable. Within this limitation, negative for an acute infarct. Per chart found to have severe toxic and metabolic encephalopathy present on admission.   Assessment / Plan / Recommendation Clinical Impression  Pt has made improvements from yesterday namely in alertness and communication. She was awake throughout assessment with family at bedside. Prior to admission pt was independent with ADL's, driving etc. Today she is verbally communicative without impairments noted in language or verbal initiation and her speech is intelligible. She did repeat incorrect information several times and needed clarification. Her comprehension appeared intact for basic information and followed one step commands without difficulty. She named 6 items in simple categories. Cognitively she exhibited mildly delayed processing and required some repetition of information. She was oriented to person, place and time. Impairments noted in basic problem solving, processing speed. Formal asssessment of memory to be completed in addition to awareness of impairments and situation. ST will continue for independence of cognitive abilities.    SLP Assessment  SLP Recommendation/Assessment: Patient needs continued  Speech Lanaguage Pathology Services SLP Visit Diagnosis: Cognitive communication deficit (R41.841)    Recommendations for follow up therapy are one component of a multi-disciplinary discharge planning process, led by the attending physician.  Recommendations may be updated based on patient status, additional functional criteria and insurance authorization.    Follow Up Recommendations  Skilled nursing-short term rehab (<3 hours/day)    Assistance Recommended at Discharge  Frequent or constant Supervision/Assistance  Functional Status Assessment Patient has had a recent decline in their functional status and demonstrates the ability to make significant improvements in function in a reasonable and predictable amount of time.  Frequency and Duration min 2x/week  2 weeks      SLP Evaluation Cognition  Overall Cognitive Status: Impaired/Different from baseline Arousal/Alertness: Awake/alert Orientation Level: Oriented to person;Oriented to time;Oriented to place Year: 2024 Month: December Attention: Sustained Sustained Attention: Appears intact Memory:  (to be assessed) Awareness:  (will be assess in more depth) Problem Solving: Impaired Problem Solving Impairment: Verbal basic Safety/Judgment: Impaired       Comprehension  Auditory Comprehension Overall Auditory Comprehension: Appears within functional limits for tasks assessed Commands: Within Functional Limits Conversation: Simple Interfering Components: Processing speed Visual Recognition/Discrimination Discrimination: Not tested Reading Comprehension Reading Status: Not tested    Expression Expression Primary Mode of Expression:  Verbal Verbal Expression Overall Verbal Expression: Appears within functional limits for tasks assessed Initiation: No impairment Level of Generative/Spontaneous Verbalization: Sentence Repetition:  (NT) Naming: Impairment Divergent:  (named 6 items in category) Pragmatics: No  impairment Written Expression Written Expression: Not tested   Oral / Motor  Oral Motor/Sensory Function Overall Oral Motor/Sensory Function: Mild impairment Facial ROM: Reduced right (minimal) Facial Symmetry: Abnormal symmetry right Lingual ROM: Within Functional Limits Lingual Symmetry: Within Functional Limits Motor Speech Overall Motor Speech: Appears within functional limits for tasks assessed Respiration: Within functional limits Phonation: Normal Resonance: Within functional limits Articulation: Within functional limitis Intelligibility: Intelligible Motor Planning: Witnin functional limits            Royce Macadamia 03/17/2023, 11:05 AM

## 2023-03-17 NOTE — Progress Notes (Signed)
Pt eating, RN requested we attempt eeg in around 45 minutes

## 2023-03-17 NOTE — Progress Notes (Signed)
NEUROLOGY CONSULT FOLLOW UP NOTE   Date of service: March 17, 2023 Patient Name: [name] [name] MRN:  [id number] DOB:  December 08, 1945  Brief HPI  [name] [name] is a 77 y.o. female  has a past medical history of Adenomatous colon polyp, Aortic regurgitation, Arthritis, Cardiomyopathy (HCC), Carotid stenosis (08/17/2017), Chronic diastolic CHF (congestive heart failure), NYHA class 2 (HCC), Coronary artery disease (10/05/2013), Diabetes mellitus without complication (HCC), Dilated aortic root (HCC), GERD (gastroesophageal reflux disease), Gout, Hypercholesteremia, Hypertension, S/P Bio-Bentall aortic root replacement with bioprosthetic valve conduit (10/27/2013), S/P CABG x 1 (10/27/2013), and Thyroiditis (10/2009). who presented with  AMS and difficulty speaking. Initial presentation concerning for meningitis and was started on Abx.    Interval Hx/subjective   Family at the bedside.  Fever and WBC improved 102.2 -> 98.5 documented 24h TMax, WBC 12.8 -> 8.9 UA negative.  UDS negative Magnesium 1.3  Family reports improved alertness, attempts to communicate this morning. Patient wakes to tactile stimulation, she does correctly state her name, age, and location with some preservation. Unable to name objects. Does not follow commands but will mimic. Inconsistent blink to threat on the right but will track to the right and left. Persistent right mouth droop with dysarthria. Spontaneously moves BLE without gravity, able to elevate bilateral upper extremities antigravity without drift.   Vitals   Vitals:   03/16/23 2010 03/17/23 0000 03/17/23 0200 03/17/23 0636  BP: 132/81 (!) 146/65 119/69 136/60  Pulse: 83 75 76 73  Resp: 16 15 15 17   Temp: 98.5 F (36.9 C) 98 F (36.7 C)    TempSrc: Oral Oral    SpO2: 98% 95% 95% 93%  Weight:      Height:        Body mass index is 30.96 kg/m.  Physical Exam   Constitutional:  Ill appearing, elderly, caucasian female Psych: Patient is calm and  cooperative with exam, does try and scratch at her EEG wires requiring redirection  Eyes: No scleral injection.  HENT: No OP obstrucion.  Head: Normocephalic.  Cardiovascular: Normal rate and regular rhythm.  Respiratory: Effort normal, non-labored breathing on room air GI: Soft.  No distension. There is no tenderness.  Skin: WDI.   Neurologic Examination   Patient is asleep resting with eyes closed, wakes to tactile stimulation.  She does not follow commands but she will mimic.  Patient will keep bilateral upper extremity elevated antigravity with constant examiner coaching.  She moves bilateral lower extremities to stimulation but does not elevate them to command.  She answers her age, place, and name correctly with some perseveration and dysarthria.  She is unable to state the situation or the month (perseverated on "73" and "77").  She is unable to name objects and speech is intermittently unintelligible.  When asked to name "thumb" she states "pillowcase" within other unintelligible speech. She does fixate and track to the left and right with inconsistent blink to threat on the right. Right mouth asymmetry is persistent. Reacts to touch throughout extremities.   Labs and Diagnostic Imaging   CBC:  Recent Labs  Lab 03/15/23 1027 03/15/23 1842 03/16/23 0459 03/17/23 0442  WBC 9.9  --  12.8* 8.9  NEUTROABS 6.4  --   --  4.8  HGB 12.8   < > 11.8* 12.5  HCT 37.5   < > 34.9* 36.9  MCV 95.2  --  93.6 93.9  PLT 199  --  178 166   < > = values in this interval not  displayed.   Basic Metabolic Panel:  Lab Results  Component Value Date   NA 137 03/17/2023   K 3.4 (L) 03/17/2023   CO2 21 (L) 03/17/2023   GLUCOSE 95 03/17/2023   BUN 7 (L) 03/17/2023   CREATININE 0.85 03/17/2023   CALCIUM 8.8 (L) 03/17/2023   GFRNONAA >60 03/17/2023   GFRAA >60 05/29/2017   Lipid Panel:  Lab Results  Component Value Date   LDLCALC 31 03/16/2023   HgbA1c:  Lab Results  Component Value Date    HGBA1C 7.1 (H) 03/16/2023   Urine Drug Screen:     Component Value Date/Time   LABOPIA NONE DETECTED 03/15/2023 1810   COCAINSCRNUR NONE DETECTED 03/15/2023 1810   LABBENZ NONE DETECTED 03/15/2023 1810   AMPHETMU NONE DETECTED 03/15/2023 1810   THCU NONE DETECTED 03/15/2023 1810   LABBARB NONE DETECTED 03/15/2023 1810    Alcohol Level     Component Value Date/Time   ETH <10 03/15/2023 1027   INR  Lab Results  Component Value Date   INR 1.1 03/15/2023   APTT  Lab Results  Component Value Date   APTT 31 03/15/2023   AED levels: No results found for: "PHENYTOIN", "ZONISAMIDE", "LAMOTRIGINE", "LEVETIRACETA"  CT Head without contrast(Personally reviewed):  No acute cortically based infarct or acute intracranial hemorrhage identified. ASPECTS 10.  CT angio Head and Neck with contrast(Personally reviewed): Negative for large vessel occlusion. ICA siphon calcified atherosclerosis with up to Moderate bilateral siphon stenosis.  MRI Brain(Personally reviewed): Motion degraded exam. Within this limitation, negative for an acute Infarct. Small bilateral mastoid effusions.   cEEG: 12/8-12/9  This study showed evidence of epileptogenicity arising from left frontal region. Additionally there was cortical dysfunction arising from left hemisphere likely secondary to underlying structural abnormality, post-ictal state. No seizures or epileptiform discharges were seen throughout the recording.   cEEG 12/9 - 12/10: "This study showed evidence of epileptogenicity arising from left frontal region. Additionally there was cortical dysfunction arising from left hemisphere likely secondary to underlying structural abnormality, post-ictal state. No seizures were seen throughout the recording."  Labs UA negative.  UDS negative WBC 12.8 -> 8.9 TSH 1.086 Vit B12 117 Procal, CRP negative  Folate 38.6 Ammonia 19 Thiamine pending   CSF:  Tube # 3  Color, CSF COLORLESS COLORLESS  Appearance,  CSF CLEAR HAZY Abnormal   Supernatant NOT INDICATED  RBC Count, CSF 0 /cu mm 2 High   WBC, CSF 0 - 5 /cu mm 2   Specimen Description CSF  Special Requests NONE  Gram Stain WBC PRESENT, PREDOMINANTLY PMN NO ORGANISMS SEEN CYTOSPIN SMEAR Performed at [facility], [address], [place], [place] [address]  Culture PENDING  Report Status PENDING   CSF Protein 81 CSF Glucose 54 Meningitis/Encephalitis panel negative  Impression   [name] [name] is a 77 y.o. female  PMH significant of coronary artery disease, chronic diastolic CHF, diabetes mellitus 2, HTN, hyperlipidemia AR s/p bioprosthetic valve repair, CABG in 2015 presented to ED with sudden confusion and aphasia 12/9. MRI brain negative for acute process, however noted to have small bilateral mastoid effusions. An LP was performed without findings concerning for CNS infection presentation and meningitis coverage was deescalated. Patient with hypomagnesemia to 1.3 this morning and with evidence of a seizure predisposition on EEG, would favor continuing Keppra at this time.  With febrile patient on arrival with WBC elevation to 12.8, consider infectious process of unknown etiology (UA negative, CSF findings unremarkable, CXR without evidence of  acute cardiopulmonary disease) lowering seizure threshold as well.  I continue to think stroke may be in the differential with ongoing aphasia and right mouth droop in the setting of unremarkable CSF findings and would favor repeating an MRI brain with and without contrast for further evaluation.   Recommendations  - Repeat MRI brain, with and without contrast - Discontinue LTM EEG monitoring for MRI brain  - Continue seizure precautions - Replete magnesium per primary team, repeat AM lab, trend - Continue Keppra to 500 twice daily - Acyclovir discontinued with CSF findings unremarkable for infection - CSF labs: follow up culture - Follow up thiamine level - Continue to evaluate for  and treat infectious and metabolic derangements per primary team - Neurology will continue to follow  ______________________________________________________________________  Thank you for the opportunity to take part in the care of this patient. If you have any further questions, please contact the neurology consultation team on call. Updated oncall schedule is listed on AMION.  Signed,  Lanae Boast, AGACNP-BC [facility] Pager: [phone]  Have seen the patient reviewed the above note.  She was markedly improved when I was seeing her around 5 PM.  She was speaking relatively fluently, with only occasional word errors.  Her speech was not dysarthric.  While I was in the room, she had a change and became much more slow to respond, with increased latency of speech as well as significant dysarthria and worsening right facial weakness.  My strong suspicion at this point is that seizure is the etiology of her symptoms given the previously seen sharp wave, response to the Keppra, and lack of other findings.  I will reconnect her to continuous EEG, and I will also increase her Keppra to 750 twice daily.  Neurology will continue to follow.  Ritta Slot, MD [facility] [phone]  If 7pm- 7am, please page neurology on call as listed in AMION.

## 2023-03-17 NOTE — TOC Initial Note (Signed)
Transition of Care [facility]) - Initial/Assessment Note    Patient Details  Name: [name] [name] MRN: [id number] Date of Birth: 05-10-1945  [facility] CM/SW Contact:    Mearl Latin, LCSW Phone Number: 03/17/2023, 2:59 PM  Clinical Narrative:                 Patient admitted from home with spouse and supportive daughters. CSW following for SNF recommendation.  Expected Discharge Plan: Skilled Nursing Facility Barriers to Discharge: Continued Medical Work up, English as a second language teacher, SNF Pending bed offer   Patient Goals and CMS Choice            Expected Discharge Plan and Services In-house Referral: Clinical Social Work     Living arrangements for the past 2 months: Single Family Home                                      Prior Living Arrangements/Services Living arrangements for the past 2 months: Single Family Home Lives with:: Spouse Patient language and need for interpreter reviewed:: Yes Do you feel safe going back to the place where you live?: Yes      Need for Family Participation in Patient Care: Yes (Comment) Care giver support system in place?: Yes (comment)   Criminal Activity/Legal Involvement Pertinent to Current Situation/Hospitalization: No - Comment as needed  Activities of Daily Living      Permission Sought/Granted Permission sought to share information with : Facility Medical sales representative, Family Supports Permission granted to share information with : No  Share Information with NAME: Carollee Herter     Permission granted to share info w Relationship: Spouse  Permission granted to share info w Contact Information: [phone]  Emotional Assessment Appearance:: Appears stated age Attitude/Demeanor/Rapport: Unable to Assess Affect (typically observed): Unable to Assess Orientation: : Oriented to Self, Oriented to Place Alcohol / Substance Use: Not Applicable Psych Involvement: No (comment)  Admission diagnosis:  TIA  (transient ischemic attack) [G45.9] Patient Active Problem List   Diagnosis Date Noted   TIA (transient ischemic attack) 03/15/2023   Seizure (HCC) 03/15/2023   Controlled type 2 diabetes mellitus without complication, without long-term current use of insulin (HCC) 03/15/2023   Carotid stenosis 08/17/2017   Osteoarthritis of left knee 05/28/2017   Osteoarthritis of right knee 01/26/2017   Leg cramps 07/02/2015   Dyslipidemia, goal LDL below 70 10/21/2014   S/P Bio-Bentall aortic root replacement with bioprosthetic valve conduit 10/27/2013   S/P CABG x 1 10/27/2013   Aortic insufficiency 10/10/2013   Chronic diastolic CHF (congestive heart failure), NYHA class 2 (HCC)    Coronary artery disease 10/05/2013   PVC (premature ventricular contraction) 03/30/2013   Essential hypertension, benign 03/30/2013   PCP:  Thana Ates, MD Pharmacy:   [facility] [id number] [place] Otto, [place] - [address] [place] [address] Phone: [phone] Fax: [phone]     Social Determinants of Health (SDOH) Social History: SDOH Screenings   Food Insecurity: Unknown (03/16/2023)  Housing: Patient Declined (03/16/2023)  Transportation Needs: Patient Declined (03/16/2023)  Utilities: Patient Declined (03/16/2023)  Tobacco Use: Low Risk  (03/15/2023)   SDOH Interventions:     Readmission Risk Interventions     No data to display

## 2023-03-17 NOTE — Progress Notes (Addendum)
PROGRESS NOTE                                                                                                                                                                                                             Patient Demographics:    [name] [name], is a 77 y.o. female, DOB - Feb 11, 1946, RUE:[id number]  Outpatient Primary MD for the patient is Thana Ates, MD    LOS - 1  Admit date - 03/15/2023    Chief Complaint  Patient presents with   Aphasia       Brief Narrative (HPI from H&P)   77 y.o. female with PMH significant of coronary artery disease, chronic diastolic CHF, diabetes mellitus 2, HTN, hyperlipidemia AR s/p bioprosthetic valve repair, CABG in 2015 presented to ED with sudden confusion and aphasia this morning.  History was obtained from patient's husband and daughter at the bedside.  Per husband, she was in her normal state of health until last night until 10 PM.  This morning, around 8:15 am, he noted that she was confused, was trying to make coffee however had not put the water in the coffee machine.  He noted that her speech was slurred and then dropped as 4 out of 5 right hand.  She was also noted to have right facial droop, came to the ER and was seen by neurology and admitted to the hospital for further workup.   Subjective:    [name] [name] today in bed, quite confused, unable to provide reliable review of systems.   Assessment  & Plan :    Severe toxic and metabolic encephalopathy present on admission. -Patient underwent stroke workup including MRI brain, CTA head and neck, P shows unremarkable CSF, overnight EEG raises question of some epileptogenicity. UA was unremarkable, stable ammonia, stable TSH, low B12 - replaced.  Neurology on board and following.  EEG noted on 03/17/2023 with question of some propensity for epileptogenicity arising from left frontal region, on Keppra dose to be adjusted by  neurology.  Follow cultures.  Of note family does not want patient to use Ativan.  Case discussed with neurologist Dr. Amada Jupiter on 03/17/2023, repeat MRI ordered by neurology on 03/17/2023 to rule out a small stroke.    Essential hypertension, benign -Hold antihypertensives, continue gentle hydration   Acute metabolic encephalopathy in the setting of  possible dementia - Per patient's family, she has been having memory issues for last 6 to 9 months, ambulates with a cane, low B12 replaced, TSH stable, B1 pending.  Use Haldol for as needed agitation avoid benzodiazepines.   Coronary artery disease,  Chronic diastolic CHF (congestive heart failure), NYHA class 2 (HCC) - 2D echo 04/28/2022 had shown EF 55 to 60%, G2 DD, mild aortic valve regurgitation, 23 mm valve present, No complaints of recent chest pain, shortness of breath or any cardiac symptoms   Dyslipidemia, goal LDL below 70 - Currently n.p.o., follow lipid panel   Hypokalemia and hypomagnesemia.  Replaced   GERD  -  PPI   Diabetes Mellitus type II, NIDDM uncontrolled with hyperglycemia - ISS  Lab Results  Component Value Date   HGBA1C 7.1 (H) 03/16/2023   CBG (last 3)  Recent Labs    03/16/23 2354 03/17/23 0414 03/17/23 0827  GLUCAP 108* 83 152*        Condition - Extremely Guarded  Family Communication  :  Husband bedside 03/16/23,  daughter bedside on 03/17/2023 Code Status :  Full  Consults  :  Neurology  PUD Prophylaxis :    Procedures  :     Overnight EEG 03/17/23 - This study showed evidence of epileptogenicity arising from left frontal region. Additionally there was cortical dysfunction arising from left hemisphere likely secondary to underlying structural abnormality, post-ictal state. No seizures were seen throughout the recording.   LP by IR 03/16/2023.  Stable CSF rules out any infection.    MRI - Motion degraded exam. Within this limitation, negative for an acute infarct  EEG - Non acute  CT Head  -   1. No acute cortically based infarct or acute intracranial hemorrhage identified. ASPECTS 10. 2. Moderately advanced cerebral white matter changes most commonly due to small vessel disease.   CTA Head -   1. Negative for large vessel occlusion. 2. ICA siphon calcified atherosclerosis with up to Moderate bilateral siphon stenosis. 3. Extracranial generalized arterial tortuosity but generally mild extracranial atherosclerosis. No other hemodynamically significant arterial stenosis. Aortic Atherosclerosis (ICD10-I70.0). 4. Evidence of Pulmonary Edema in the visible upper chest.       Disposition Plan  :    Status is: Observation  DVT Prophylaxis  :    enoxaparin (LOVENOX) injection 40 mg Start: 03/15/23 2115    Lab Results  Component Value Date   PLT 166 03/17/2023    Diet :  Diet Order             DIET - DYS 1 Room service appropriate? No; Fluid consistency: Nectar Thick  Diet effective now                    Inpatient Medications  Scheduled Meds:  aspirin  300 mg Rectal Daily   Or   aspirin  325 mg Oral Daily   cyanocobalamin  1,000 mcg Subcutaneous Daily   [START ON 03/19/2023] vitamin B-12  1,000 mcg Oral Daily   enoxaparin (LOVENOX) injection  40 mg Subcutaneous Q24H   insulin aspart  0-9 Units Subcutaneous Q4H   levETIRAcetam  500 mg Oral BID   Continuous Infusions:  levETIRAcetam 500 mg (03/16/23 2300)   meropenem (MERREM) IV 2 g (03/17/23 0630)   potassium chloride 10 mEq (03/17/23 1022)   vancomycin 750 mg (03/17/23 0835)   PRN Meds:.acetaminophen **OR** acetaminophen (TYLENOL) oral liquid 160 mg/5 mL **OR** acetaminophen, haloperidol lactate    Objective:   Vitals:  03/16/23 2010 03/17/23 0000 03/17/23 0200 03/17/23 0636  BP: 132/81 (!) 146/65 119/69 136/60  Pulse: 83 75 76 73  Resp: 16 15 15 17   Temp: 98.5 F (36.9 C) 98 F (36.7 C)    TempSrc: Oral Oral    SpO2: 98% 95% 95% 93%  Weight:      Height:        Wt Readings from Last 3  Encounters:  03/15/23 73.1 kg  04/17/22 74.8 kg  08/20/20 74.8 kg     Intake/Output Summary (Last 24 hours) at 03/17/2023 1029 Last data filed at 03/16/2023 1825 Gross per 24 hour  Intake 1028.6 ml  Output 650 ml  Net 378.6 ml     Physical Exam  Awake but confused, No new F.N deficits, Normal affect [place].AT,[name] Supple Neck, No JVD,   Symmetrical Chest wall movement, Good air movement bilaterally, CTAB RRR,No Gallops,Rubs or new Murmurs,  +ve B.Sounds, Abd Soft, No tenderness,   No Cyanosis, Clubbing or edema       Data Review:    Recent Labs  Lab 03/15/23 1027 03/15/23 1842 03/16/23 0459 03/17/23 0442  WBC 9.9  --  12.8* 8.9  HGB 12.8 12.2 11.8* 12.5  HCT 37.5 36.0 34.9* 36.9  PLT 199  --  178 166  MCV 95.2  --  93.6 93.9  MCH 32.5  --  31.6 31.8  MCHC 34.1  --  33.8 33.9  RDW 13.2  --  12.9 12.8  LYMPHSABS 2.7  --   --  2.8  MONOABS 0.6  --   --  0.8  EOSABS 0.2  --   --  0.4  BASOSABS 0.1  --   --  0.1    Recent Labs  Lab 03/15/23 1027 03/15/23 1840 03/15/23 1842 03/15/23 2105 03/16/23 0459 03/16/23 0555 03/17/23 0442  NA 135  --  134*  --   --   --  137  K 4.2  --  3.8  --   --   --  3.4*  CL 98  --  101  --   --   --  103  CO2 27  --   --   --   --   --  21*  ANIONGAP 10  --   --   --   --   --  13  GLUCOSE 180*  --  101*  --   --   --  95  BUN 13  --  8  --   --   --  7*  CREATININE 0.74  --  0.70 0.78  --   --  0.85  AST 21  --   --   --   --   --   --   ALT 13  --   --   --   --   --   --   ALKPHOS 46  --   --   --   --   --   --   BILITOT 0.8  --   --   --   --   --   --   ALBUMIN 4.5  --   --   --   --   --   --   CRP  --   --   --   --   --  <0.5 2.0*  PROCALCITON  --  <0.10  --   --   --  <0.10 <0.10  INR 1.1  --   --   --   --   --   --  TSH  --   --   --  1.086  --   --   --   HGBA1C  --   --   --   --  7.1*  --   --   AMMONIA  --   --   --   --   --  19  --   BNP  --   --   --   --   --  474.9* 167.1*  MG  --   --   --   --    --   --  1.3*  CALCIUM 10.0  --   --   --   --   --  8.8*      Recent Labs  Lab 03/15/23 1027 03/15/23 1840 03/15/23 2105 03/16/23 0459 03/16/23 0555 03/17/23 0442  CRP  --   --   --   --  <0.5 2.0*  PROCALCITON  --  <0.10  --   --  <0.10 <0.10  INR 1.1  --   --   --   --   --   TSH  --   --  1.086  --   --   --   HGBA1C  --   --   --  7.1*  --   --   AMMONIA  --   --   --   --  19  --   BNP  --   --   --   --  474.9* 167.1*  MG  --   --   --   --   --  1.3*  CALCIUM 10.0  --   --   --   --  8.8*    Lab Results  Component Value Date   CHOL 111 03/16/2023   HDL 59 03/16/2023   LDLCALC 31 03/16/2023   TRIG 107 03/16/2023   CHOLHDL 1.9 03/16/2023    Lab Results  Component Value Date   HGBA1C 7.1 (H) 03/16/2023   Recent Labs    03/15/23 2105  TSH 1.086   Recent Labs    03/15/23 2105 03/16/23 0459  VITAMINB12 117*  --   FOLATE  --  38.6      Micro Results Recent Results (from the past 240 hour(s))  Respiratory (~20 pathogens) panel by PCR     Status: None   Collection Time: 03/15/23  5:33 PM   Specimen: Nasopharyngeal Swab; Respiratory  Result Value Ref Range Status   Adenovirus NOT DETECTED NOT DETECTED Final   Coronavirus 229E NOT DETECTED NOT DETECTED Final    Comment: (NOTE) The Coronavirus on the Respiratory Panel, DOES NOT test for the novel  Coronavirus (2019 nCoV)    Coronavirus HKU1 NOT DETECTED NOT DETECTED Final   Coronavirus NL63 NOT DETECTED NOT DETECTED Final   Coronavirus OC43 NOT DETECTED NOT DETECTED Final   Metapneumovirus NOT DETECTED NOT DETECTED Final   Rhinovirus / Enterovirus NOT DETECTED NOT DETECTED Final   Influenza A NOT DETECTED NOT DETECTED Final   Influenza B NOT DETECTED NOT DETECTED Final   Parainfluenza Virus 1 NOT DETECTED NOT DETECTED Final   Parainfluenza Virus 2 NOT DETECTED NOT DETECTED Final   Parainfluenza Virus 3 NOT DETECTED NOT DETECTED Final   Parainfluenza Virus 4 NOT DETECTED NOT DETECTED Final    Respiratory Syncytial Virus NOT DETECTED NOT DETECTED Final   Bordetella pertussis NOT DETECTED NOT DETECTED Final   Bordetella Parapertussis NOT DETECTED NOT DETECTED Final   Chlamydophila  pneumoniae NOT DETECTED NOT DETECTED Final   Mycoplasma pneumoniae NOT DETECTED NOT DETECTED Final    Comment: Performed at [facility], [address] [place], [place] [address]  Resp panel by RT-PCR (RSV, Flu A&B, Covid) Nasopharyngeal Swab     Status: None   Collection Time: 03/15/23  5:33 PM   Specimen: Nasopharyngeal Swab; Nasal Swab  Result Value Ref Range Status   SARS Coronavirus 2 by RT PCR NEGATIVE NEGATIVE Final   Influenza A by PCR NEGATIVE NEGATIVE Final   Influenza B by PCR NEGATIVE NEGATIVE Final    Comment: (NOTE) The Xpert Xpress SARS-CoV-2/FLU/RSV plus assay is intended as an aid in the diagnosis of influenza from Nasopharyngeal swab specimens and should not be used as a sole basis for treatment. Nasal washings and aspirates are unacceptable for Xpert Xpress SARS-CoV-2/FLU/RSV testing.  Fact Sheet for Patients: BloggerCourse.com  Fact Sheet for Healthcare Providers: SeriousBroker.it  This test is not yet approved or cleared by the [place] FDA and has been authorized for detection and/or diagnosis of SARS-CoV-2 by FDA under an Emergency Use Authorization (EUA). This EUA will remain in effect (meaning this test can be used) for the duration of the COVID-19 declaration under Section 564(b)(1) of the Act, 21 U.S.C. section 360bbb-3(b)(1), unless the authorization is terminated or revoked.     Resp Syncytial Virus by PCR NEGATIVE NEGATIVE Final    Comment: (NOTE) Fact Sheet for Patients: BloggerCourse.com  Fact Sheet for Healthcare Providers: SeriousBroker.it  This test is not yet approved or cleared by the [place] FDA and has been authorized for detection  and/or diagnosis of SARS-CoV-2 by FDA under an Emergency Use Authorization (EUA). This EUA will remain in effect (meaning this test can be used) for the duration of the COVID-19 declaration under Section 564(b)(1) of the Act, 21 U.S.C. section 360bbb-3(b)(1), unless the authorization is terminated or revoked.  Performed at [facility], [address] [place], [place] [address]   Culture, blood (Routine X 2) w Reflex to ID Panel     Status: None (Preliminary result)   Collection Time: 03/15/23  6:40 PM   Specimen: BLOOD RIGHT HAND  Result Value Ref Range Status   Specimen Description BLOOD RIGHT HAND  Final   Special Requests   Final    BOTTLES DRAWN AEROBIC ONLY Blood Culture results may not be optimal due to an inadequate volume of blood received in culture bottles   Culture   Final    NO GROWTH 2 DAYS Performed at [facility], [address] [place], [place] [address]    Report Status PENDING  Incomplete  Culture, blood (Routine X 2) w Reflex to ID Panel     Status: None (Preliminary result)   Collection Time: 03/16/23  4:59 AM   Specimen: BLOOD RIGHT HAND  Result Value Ref Range Status   Specimen Description BLOOD RIGHT HAND  Final   Special Requests   Final    BOTTLES DRAWN AEROBIC AND ANAEROBIC Blood Culture adequate volume   Culture   Final    NO GROWTH 1 DAY Performed at [facility], [address] [place], [place] [address]    Report Status PENDING  Incomplete  CSF culture w Gram Stain     Status: None (Preliminary result)   Collection Time: 03/16/23  4:42 PM   Specimen: PATH Cytology CSF; Cerebrospinal Fluid  Result Value Ref Range Status   Specimen Description CSF  Final  Special Requests NONE  Final   Gram Stain   Final    WBC PRESENT, PREDOMINANTLY PMN NO ORGANISMS SEEN CYTOSPIN SMEAR Performed at [facility], [address] [place], [place] [address]    Culture PENDING  Incomplete   Report Status PENDING  Incomplete     Radiology Reports DG FL GUIDED LUMBAR PUNCTURE  Result Date: 03/16/2023 CLINICAL DATA:  Currently admitted for altered mental status. Team is requesting lumbar puncture for further evaluation EXAM: LUMBAR PUNCTURE UNDER FLUOROSCOPY PROCEDURE: An appropriate skin entry site was determined fluoroscopically. Operator donned sterile gloves and mask. Skin site was marked, then prepped with Betadine, draped in usual sterile fashion, and infiltrated locally with 1% lidocaine. A 20 gauge spinal needle advanced into the thecal sac at L5-S1 from a left interlaminar approach. Clear colorless CSF spontaneously returned, with opening pressure of 9 cm water. 13 ml CSF were collected and divided among 4 sterile vials for the requested laboratory studies. The needle was then removed. The patient tolerated the procedure well and there were no complications. FLUOROSCOPY: Radiation Exposure Index (as provided by the fluoroscopic device): 3.5 mGy K[place]a IMPRESSION: Technically successful lumbar puncture under fluoroscopy. This exam was performed by Anders Grant NP, and was supervised and interpreted by Dr. Ruthy Dick Electronically Signed   By: Gilmer Mor D.O.   On: 03/16/2023 16:58   ECHOCARDIOGRAM COMPLETE  Result Date: 03/16/2023    ECHOCARDIOGRAM REPORT   Patient Name:   CHEA WOOLWORTH Date of Exam: 03/16/2023 Medical Rec #:  [id number]        Height:       60.5 in Accession #:    [id number]       Weight:       161.2 lb Date of Birth:  19-Feb-1946         BSA:          1.713 m Patient Age:    39 years         BP:           134/62 mmHg Patient Gender: F                HR:           74 bpm. Exam Location:  Inpatient Procedure: 2D Echo, Cardiac Doppler and Color Doppler Indications:    TIA G45.9  History:        Patient has prior history of Echocardiogram examinations, most                 recent 04/28/2022. CHF, CAD, Prior CABG, TIA, Arrythmias:PVC;                 Risk Factors:Hypertension, Diabetes and  Dyslipidemia.                 Aortic Valve: 23 mm Magna valve is present in the aortic                 position. Procedure Date: 10/27/2013.  Sonographer:    Lucendia Herrlich RCS Referring Phys: Delene Ruffini RAI  Sonographer Comments: Image acquisition challenging due to uncooperative patient. IMPRESSIONS  1. Left ventricular ejection fraction, by estimation, is 40 to 45%. The left ventricle has mildly decreased function. The left ventricle demonstrates global hypokinesis. The left ventricular internal cavity size was moderately dilated. Left ventricular diastolic parameters were normal.  2. Right ventricular systolic function is normal. The right ventricular size is normal.  3. Left atrial size was moderately dilated.  4. The mitral valve is abnormal. Mild mitral valve regurgitation. No evidence of mitral stenosis.  5. Post AVR with 23 mm bioprosthetic Magna Ease valve . No PVL mean gradient 11 mmHg. The aortic valve has been repaired/replaced. Aortic valve regurgitation is not visualized. No aortic stenosis is present. There is a 23 mm Magna valve present in the aortic position. Procedure Date: 10/27/2013.  6. The inferior vena cava is normal in size with greater than 50% respiratory variability, suggesting right atrial pressure of 3 mmHg. FINDINGS  Left Ventricle: Left ventricular ejection fraction, by estimation, is 40 to 45%. The left ventricle has mildly decreased function. The left ventricle demonstrates global hypokinesis. The left ventricular internal cavity size was moderately dilated. There is no left ventricular hypertrophy. Left ventricular diastolic parameters were normal. Right Ventricle: The right ventricular size is normal. No increase in right ventricular wall thickness. Right ventricular systolic function is normal. Left Atrium: Left atrial size was moderately dilated. Right Atrium: Right atrial size was normal in size. Pericardium: There is no evidence of pericardial effusion. Mitral Valve: The  mitral valve is abnormal. There is mild thickening of the mitral valve leaflet(s). There is mild calcification of the mitral valve leaflet(s). Mild mitral annular calcification. Mild mitral valve regurgitation. No evidence of mitral valve stenosis. Tricuspid Valve: The tricuspid valve is normal in structure. Tricuspid valve regurgitation is mild . No evidence of tricuspid stenosis. Aortic Valve: Post AVR with 23 mm bioprosthetic Magna Ease valve . No PVL mean gradient 11 mmHg. The aortic valve has been repaired/replaced. Aortic valve regurgitation is not visualized. No aortic stenosis is present. Aortic valve mean gradient measures  11.0 mmHg. Aortic valve peak gradient measures 21.0 mmHg. Aortic valve area, by VTI measures 1.39 cm. There is a 23 mm Magna valve present in the aortic position. Procedure Date: 10/27/2013. Pulmonic Valve: The pulmonic valve was normal in structure. Pulmonic valve regurgitation is not visualized. No evidence of pulmonic stenosis. Aorta: The aortic root is normal in size and structure. Venous: The inferior vena cava is normal in size with greater than 50% respiratory variability, suggesting right atrial pressure of 3 mmHg. IAS/Shunts: No atrial level shunt detected by color flow Doppler.  LEFT VENTRICLE PLAX 2D LVIDd:         4.95 cm      Diastology LVIDs:         4.10 cm      LV e' medial:    6.74 cm/s LV PW:         0.85 cm      LV E/e' medial:  14.1 LV IVS:        0.80 cm      LV e' lateral:   11.20 cm/s LVOT diam:     1.90 cm      LV E/e' lateral: 8.5 LV SV:         68 LV SV Index:   40 LVOT Area:     2.84 cm  LV Volumes (MOD) LV vol d, MOD A2C: 77.2 ml LV vol d, MOD A4C: 113.0 ml LV vol s, MOD A2C: 41.4 ml LV vol s, MOD A4C: 65.7 ml LV SV MOD A2C:     35.8 ml LV SV MOD A4C:     113.0 ml LV SV MOD BP:      39.2 ml RIGHT VENTRICLE            IVC RV S prime:     9.90 cm/s  IVC diam: 1.90  cm TAPSE (M-mode): 1.8 cm LEFT ATRIUM             Index        RIGHT ATRIUM           Index LA  diam:        4.25 cm 2.48 cm/m   RA Area:     11.20 cm LA Vol (A2C):   46.0 ml 26.85 ml/m  RA Volume:   18.90 ml  11.03 ml/m LA Vol (A4C):   52.1 ml 30.41 ml/m LA Biplane Vol: 50.5 ml 29.47 ml/m  AORTIC VALVE AV Area (Vmax):    1.43 cm AV Area (Vmean):   1.47 cm AV Area (VTI):     1.39 cm AV Vmax:           229.00 cm/s AV Vmean:          154.000 cm/s AV VTI:            0.491 m AV Peak Grad:      21.0 mmHg AV Mean Grad:      11.0 mmHg LVOT Vmax:         115.67 cm/s LVOT Vmean:        80.100 cm/s LVOT VTI:          0.241 m LVOT/AV VTI ratio: 0.49  AORTA Ao Root diam: 3.50 cm Ao Asc diam:  2.80 cm MITRAL VALVE MV Area (PHT): 3.12 cm    SHUNTS MV Decel Time: 243 msec    Systemic VTI:  0.24 m MR Peak grad: 60.8 mmHg    Systemic Diam: 1.90 cm MR Vmax:      390.00 cm/s MV E velocity: 94.90 cm/s MV A velocity: 94.90 cm/s MV E/A ratio:  1.00 Charlton Haws MD Electronically signed by Charlton Haws MD Signature Date/Time: 03/16/2023/11:17:54 AM    Final    DG Chest Port 1 View  Result Date: 03/16/2023 CLINICAL DATA:  77 year old female with history of shortness of breath. EXAM: PORTABLE CHEST 1 VIEW COMPARISON:  Chest x-ray 03/15/2023. FINDINGS: Lung volumes are normal. No consolidative airspace disease. No definite pleural effusions. No pneumothorax. No evidence of pulmonary edema. Heart size is normal. Upper mediastinal contours are within normal limits. Atherosclerotic calcifications are noted in the thoracic aorta. Status post median sternotomy for aortic valve replacement (a stented bioprosthesis is noted). IMPRESSION: 1. No radiographic evidence of acute cardiopulmonary disease. 2. Aortic atherosclerosis. Electronically Signed   By: Trudie Reed M.D.   On: 03/16/2023 07:30   Overnight EEG with video  Result Date: 03/16/2023 Charlsie Quest, MD     03/17/2023  9:28 AM Patient Name: WHITNEY BRENCHLEY MRN: [id number] Epilepsy Attending: Charlsie Quest Referring Physician/Provider: Gordy Councilman, MD  Duration: 03/15/2023 1516 to 03/16/2023 2130 Patient history: 77yo F with aphasia getting eeg to evaluate for seizure Level of alertness: Awake, asleep AEDs during EEG study: LEV Technical aspects: This EEG study was done with scalp electrodes positioned according to the 10-20 International system of electrode placement. Electrical activity was reviewed with band pass filter of 1-70Hz , sensitivity of 7 uV/mm, display speed of 36mm/sec with a 60Hz  notched filter applied as appropriate. EEG data were recorded continuously and digitally stored.  Video monitoring was available and reviewed as appropriate. Description: The posterior dominant rhythm consists of 8 Hz activity of moderate voltage (25-35 uV) seen predominantly in posterior head regions, asymmetric ( left < right)and reactive to eye opening and eye closing. Sleep was characterized by vertex waves,  sleep spindles (12 to 14 Hz), maximal frontocentral region. EEG showed continuous 2 to 3 Hz delta slowing in left hemisphere. Sharp waves were noted in left frontal region. Hyperventilation and photic stimulation were not performed.   EEG was disconnected between 03/15/2023 1923 to 03/16/2023 0114 due to room transfer ABNORMALITY -Sharp wave, left frontal region. - Continuous slow, left hemisphere IMPRESSION: This study showed evidence of epileptogenicity arising from left frontal region. Additionally there was cortical dysfunction arising from left hemisphere likely secondary to underlying structural abnormality, post-ictal state. No seizures were seen throughout the recording. Priyanka Annabelle Harman   DG CHEST PORT 1 VIEW  Result Date: 03/15/2023 CLINICAL DATA:  Seizure, right-sided facial droop EXAM: PORTABLE CHEST 1 VIEW COMPARISON:  05/02/2020 FINDINGS: Single frontal view of the chest demonstrates postsurgical changes from median sternotomy and aortic valve replacement. Stable cardiac silhouette. Pulmonary vascular congestion without acute airspace disease,  effusion, or pneumothorax. No acute bony abnormalities. IMPRESSION: 1. Pulmonary vascular congestion without overt edema. Electronically Signed   By: Sharlet Salina M.D.   On: 03/15/2023 18:59   MR BRAIN WO CONTRAST  Result Date: 03/15/2023 CLINICAL DATA:  Transient ischemic attack (TIA) EXAM: MRI HEAD WITHOUT CONTRAST TECHNIQUE: Multiplanar, multiecho pulse sequences of the brain and surrounding structures were obtained without intravenous contrast. COMPARISON:  Same day stroke code CT and CTA head/neck FINDINGS: Limitations: Motion degraded exam. Axial FLAIR sequences are essentially nondiagnostic. Brain: Negative for an acute infarct. No hemorrhage. No hydrocephalus. No extra-axial fluid collection. No mass effect. No mass lesion. Vascular: Normal flow voids. Skull and upper cervical spine: Normal marrow signal. Sinuses/Orbits: Small bilateral mastoid effusions. No middle ear effusion. Bilateral lens replacement. Orbits are otherwise unremarkable. Other: None. IMPRESSION: Motion degraded exam. Within this limitation, negative for an acute infarct Electronically Signed   By: Lorenza Cambridge M.D.   On: 03/15/2023 14:36   CT ANGIO HEAD NECK W WO CM (CODE STROKE)  Result Date: 03/15/2023 CLINICAL DATA:  77 year old female code stroke presentation. EXAM: CT ANGIOGRAPHY HEAD AND NECK WITH AND WITHOUT CONTRAST TECHNIQUE: Multidetector CT imaging of the head and neck was performed using the standard protocol during bolus administration of intravenous contrast. Multiplanar CT image reconstructions and MIPs were obtained to evaluate the vascular anatomy. Carotid stenosis measurements (when applicable) are obtained utilizing NASCET criteria, using the distal internal carotid diameter as the denominator. RADIATION DOSE REDUCTION: This exam was performed according to the departmental dose-optimization program which includes automated exposure control, adjustment of the mA and/or kV according to patient size and/or use  of iterative reconstruction technique. CONTRAST:  75mL OMNIPAQUE IOHEXOL 350 MG/ML SOLN COMPARISON:  Head CT 1035 hours today. FINDINGS: CTA NECK Skeleton: Sternotomy. Cervical spine degeneration, mostly right side facet arthropathy. No acute osseous abnormality identified. Upper chest: Septal thickening, ground-glass opacity, dependent lung opacity. No apical pleural effusions. Negative visible mediastinum. Other neck: Negative. Aortic arch: Calcified aortic atherosclerosis.  3 vessel arch. Right carotid system: Brachiocephalic artery and proximal right CCA are tortuous with mild plaque, no stenosis. Mild calcified plaque at the right ICA origin and bulb without stenosis. Highly tortuous right ICA distal to the bulb. Left carotid system: Similar tortuosity. Low-density soft plaque at the ventral left CCA at the level of the larynx without stenosis on series 4, image 111. Otherwise mild proximal ICA plaque without stenosis. Tortuous left ICA distal to the bulb. Vertebral arteries: Tortuous proximal right subclavian artery with soft and calcified plaque but no significant stenosis. Right vertebral artery origin remains normal. Tortuous  right V1 segment and mildly late entry of the right vertebral into the transverse foramen. Right vertebral is patent to the skull base without stenosis. Tortuous and atherosclerotic proximal left subclavian artery with a kinked appearance at the thoracic inlet but no atherosclerotic stenosis. Left vertebral artery origin is tortuous without stenosis. Left vertebral artery appears mildly dominant and is patent to the skull base with no significant plaque or stenosis. CTA HEAD Posterior circulation: Distal vertebral arteries and vertebrobasilar junction are patent without stenosis. Normal PICA origins. Patent basilar artery without stenosis. Patent SCA and PCA origins. Posterior communicating arteries are diminutive or absent. Bilateral PCA branches are within normal limits, there is  mild right PCA P2 segment irregularity. Anterior circulation: Both ICA siphons are patent. Left siphon is heavily calcified with mild to moderate stenosis at the anterior genu. Right siphon is heavily calcified in the supraclinoid segment with mild-to-moderate stenosis. Patent carotid termini, MCA and ACA origins. Dominant left and diminutive right ACA A1 segments. Normal anterior communicating artery. Bilateral ACA branches are within normal limits. Left MCA M1 segment is patent without stenosis. Left MCA trifurcation is mildly ectatic, patent without stenosis. Left MCA branches are within normal limits. Right MCA M1 segment and bifurcation are patent without stenosis. Right MCA branches are within normal limits. Venous sinuses: Not evaluated due to early contrast timing. Anatomic variants: Dominant left ACA A1. Mildly dominant left vertebral artery. Review of the MIP images confirms the above findings IMPRESSION: 1. Negative for large vessel occlusion. 2. ICA siphon calcified atherosclerosis with up to Moderate bilateral siphon stenosis. 3. Extracranial generalized arterial tortuosity but generally mild extracranial atherosclerosis. No other hemodynamically significant arterial stenosis. Aortic Atherosclerosis (ICD10-I70.0). 4. Evidence of Pulmonary Edema in the visible upper chest. Electronically Signed   By: Odessa Fleming M.D.   On: 03/15/2023 11:54   CT HEAD CODE STROKE WO CONTRAST  Addendum Date: 03/15/2023   ADDENDUM REPORT: 03/15/2023 11:17 ADDENDUM: Study discussed by telephone with PA BROOKE SMALL on 03/15/2023 at 1106 hours. Electronically Signed   By: Odessa Fleming M.D.   On: 03/15/2023 11:17   Result Date: 03/15/2023 CLINICAL DATA:  Code stroke. 77 year old female last known well 2200 hours. Slurred speech, aphasia. EXAM: CT HEAD WITHOUT CONTRAST TECHNIQUE: Contiguous axial images were obtained from the base of the skull through the vertex without intravenous contrast. RADIATION DOSE REDUCTION: This exam was  performed according to the departmental dose-optimization program which includes automated exposure control, adjustment of the mA and/or kV according to patient size and/or use of iterative reconstruction technique. COMPARISON:  None Available. FINDINGS: Brain: No midline shift, mass effect, or evidence of intracranial mass lesion. No ventriculomegaly. No acute intracranial hemorrhage identified. Cerebral volume is within normal limits for age. Patchy and confluent bilateral white matter hypodensity with some deep white matter involvement in both hemispheres. Deep gray nuclei relatively spared. No cortically based acute infarct identified. No cortical encephalomalacia identified. Vascular: Calcified atherosclerosis at the skull base. No suspicious intracranial vascular hyperdensity. Skull: No acute osseous abnormality identified. Sinuses/Orbits: Visualized paranasal sinuses and mastoids are clear. Other: No gaze deviation. Postoperative changes to the globes. Visualized scalp soft tissues are within normal limits. ASPECTS [facility] Stroke Program Early CT Score) Total score (0-10 with 10 being normal): 10 IMPRESSION: 1. No acute cortically based infarct or acute intracranial hemorrhage identified. ASPECTS 10. 2. Moderately advanced cerebral white matter changes most commonly due to small vessel disease. Electronically Signed: By: Odessa Fleming M.D. On: 03/15/2023 11:04  Signature  -   Susa Raring M.D on 03/17/2023 at 10:29 AM   -  To page go to [email or web address]

## 2023-03-17 NOTE — Progress Notes (Signed)
vLTM maintenance  [facility] notified.  No skin breakdown noted at A1 A2  FP1  FP2

## 2023-03-17 NOTE — Procedures (Addendum)
Patient Name: [name] [name]  MRN: [id number]  Epilepsy Attending: Charlsie Quest  Referring Physician/Provider: Gordy Councilman, MD  Duration: 03/16/2023 2130 to 03/19/2023  0630   Patient history: 77yo F with aphasia getting eeg to evaluate for seizure   Level of alertness: Awake, asleep   AEDs during EEG study: LEV   Technical aspects: This EEG study was done with scalp electrodes positioned according to the 10-20 International system of electrode placement. Electrical activity was reviewed with band pass filter of 1-70Hz , sensitivity of 7 uV/mm, display speed of 54mm/sec with a 60Hz  notched filter applied as appropriate. EEG data were recorded continuously and digitally stored.  Video monitoring was available and reviewed as appropriate.   Description: The posterior dominant rhythm consists of 8 Hz activity of moderate voltage (25-35 uV) seen predominantly in posterior head regions, asymmetric ( left < right)and reactive to eye opening and eye closing. Sleep was characterized by vertex waves, sleep spindles (12 to 14 Hz), maximal frontocentral region. EEG showed continuous 2 to 3 Hz delta slowing in left hemisphere. Sharp waves were noted in left frontal region. Hyperventilation and photic stimulation were not performed.     Of note, parts of study were difficult to interpret due to significant electrode artifact. EEG was disconnected between 1104 to 2032 for imaging   ABNORMALITY -Sharp wave, left frontal region.  - Continuous slow, left hemisphere   IMPRESSION: This study showed evidence of epileptogenicity arising from left frontal region. Additionally there was cortical dysfunction arising from left hemisphere likely secondary to underlying structural abnormality, post-ictal state. No seizures were seen throughout the recording.   [name] Annabelle Harman

## 2023-03-18 DIAGNOSIS — R569 Unspecified convulsions: Secondary | ICD-10-CM | POA: Diagnosis not present

## 2023-03-18 LAB — PROCALCITONIN: Procalcitonin: <0.1 ng/mL

## 2023-03-18 LAB — URINALYSIS, W/ REFLEX TO CULTURE (INFECTION SUSPECTED)
Bilirubin Urine: NEGATIVE
Glucose, UA: 50 mg/dL — AB
Ketones, ur: 5 mg/dL — AB
Nitrite: NEGATIVE
Protein, ur: NEGATIVE mg/dL
Specific Gravity, Urine: 1.011 (ref 1.005–1.030)
pH: 5 (ref 5.0–8.0)

## 2023-03-18 LAB — GLUCOSE, CAPILLARY
Glucose-Capillary: 144 mg/dL — ABNORMAL HIGH (ref 70–99)
Glucose-Capillary: 144 mg/dL — ABNORMAL HIGH (ref 70–99)
Glucose-Capillary: 145 mg/dL — ABNORMAL HIGH (ref 70–99)
Glucose-Capillary: 166 mg/dL — ABNORMAL HIGH (ref 70–99)
Glucose-Capillary: 253 mg/dL — ABNORMAL HIGH (ref 70–99)
Glucose-Capillary: 255 mg/dL — ABNORMAL HIGH (ref 70–99)

## 2023-03-18 LAB — CBC WITH DIFFERENTIAL/PLATELET
Abs Immature Granulocytes: 0.03 10*3/uL (ref 0.00–0.07)
Basophils Absolute: 0.1 10*3/uL (ref 0.0–0.1)
Basophils Relative: 1 %
Eosinophils Absolute: 0.5 10*3/uL (ref 0.0–0.5)
Eosinophils Relative: 5 %
HCT: 35.5 % — ABNORMAL LOW (ref 36.0–46.0)
Hemoglobin: 12.3 g/dL (ref 12.0–15.0)
Immature Granulocytes: 0 %
Lymphocytes Relative: 23 %
Lymphs Abs: 2.2 10*3/uL (ref 0.7–4.0)
MCH: 32 pg (ref 26.0–34.0)
MCHC: 34.6 g/dL (ref 30.0–36.0)
MCV: 92.4 fL (ref 80.0–100.0)
Monocytes Absolute: 0.9 10*3/uL (ref 0.1–1.0)
Monocytes Relative: 9 %
Neutro Abs: 6.1 10*3/uL (ref 1.7–7.7)
Neutrophils Relative %: 62 %
Platelets: 173 10*3/uL (ref 150–400)
RBC: 3.84 MIL/uL — ABNORMAL LOW (ref 3.87–5.11)
RDW: 13.2 % (ref 11.5–15.5)
WBC: 9.7 10*3/uL (ref 4.0–10.5)
nRBC: 0 % (ref 0.0–0.2)

## 2023-03-18 LAB — MRSA NEXT GEN BY PCR, NASAL: MRSA by PCR Next Gen: NOT DETECTED

## 2023-03-18 LAB — BASIC METABOLIC PANEL
Anion gap: 5 (ref 5–15)
BUN: 10 mg/dL (ref 8–23)
CO2: 18 mmol/L — ABNORMAL LOW (ref 22–32)
Calcium: 8.5 mg/dL — ABNORMAL LOW (ref 8.9–10.3)
Chloride: 108 mmol/L (ref 98–111)
Creatinine, Ser: 0.86 mg/dL (ref 0.44–1.00)
GFR, Estimated: >60 mL/min (ref >60)
Glucose, Bld: 134 mg/dL — ABNORMAL HIGH (ref 70–99)
Potassium: 3.8 mmol/L (ref 3.5–5.1)
Sodium: 131 mmol/L — ABNORMAL LOW (ref 135–145)

## 2023-03-18 LAB — MAGNESIUM: Magnesium: 2.3 mg/dL (ref 1.7–2.4)

## 2023-03-18 LAB — C-REACTIVE PROTEIN: CRP: 3.6 mg/dL — ABNORMAL HIGH (ref <1.0)

## 2023-03-18 LAB — BRAIN NATRIURETIC PEPTIDE: B Natriuretic Peptide: 90.6 pg/mL (ref 0.0–100.0)

## 2023-03-18 MED ORDER — GERHARDT'S BUTT CREAM
TOPICAL_CREAM | Freq: Two times a day (BID) | CUTANEOUS | Status: DC
  Filled 2023-03-18: qty 60

## 2023-03-18 MED ORDER — ASPIRIN 81 MG PO CHEW
81.0000 mg | CHEWABLE_TABLET | Freq: Every day | ORAL | Status: DC
  Administered 2023-03-18 – 2023-03-20 (×3): 81 mg via ORAL
  Filled 2023-03-18 (×3): qty 1

## 2023-03-18 MED ORDER — CARVEDILOL 3.125 MG PO TABS
3.1250 mg | ORAL_TABLET | Freq: Two times a day (BID) | ORAL | Status: DC
Start: 2023-03-18 — End: 2023-03-20
  Administered 2023-03-18 – 2023-03-20 (×4): 3.125 mg via ORAL
  Filled 2023-03-18 (×4): qty 1

## 2023-03-18 MED ORDER — ATORVASTATIN CALCIUM 10 MG PO TABS
10.0000 mg | ORAL_TABLET | Freq: Every day | ORAL | Status: DC
  Administered 2023-03-18 – 2023-03-20 (×3): 10 mg via ORAL
  Filled 2023-03-18 (×3): qty 1

## 2023-03-18 MED ORDER — CARVEDILOL 6.25 MG PO TABS: 6.2500 mg | ORAL_TABLET | Freq: Two times a day (BID) | ORAL | Status: DC

## 2023-03-18 MED ORDER — ASPIRIN 300 MG RE SUPP
150.0000 mg | Freq: Every day | RECTAL | Status: DC
Start: 2023-03-18 — End: 2023-03-20
  Filled 2023-03-18: qty 1

## 2023-03-18 NOTE — Plan of Care (Signed)
  Problem: Education: Goal: Knowledge of General Education information will improve Description: Including pain rating scale, medication(s)/side effects and non-pharmacologic comfort measures Outcome: Progressing   Problem: Health Behavior/Discharge Planning: Goal: Ability to manage health-related needs will improve Outcome: Progressing   Problem: Clinical Measurements: Goal: Ability to maintain clinical measurements within normal limits will improve Outcome: Progressing Goal: Will remain free from infection Outcome: Progressing Goal: Diagnostic test results will improve Outcome: Progressing Goal: Respiratory complications will improve Outcome: Progressing Goal: Cardiovascular complication will be avoided Outcome: Progressing   Problem: Activity: Goal: Risk for activity intolerance will decrease Outcome: Progressing   Problem: Nutrition: Goal: Adequate nutrition will be maintained Outcome: Progressing   Problem: Coping: Goal: Level of anxiety will decrease Outcome: Progressing   Problem: Elimination: Goal: Will not experience complications related to bowel motility Outcome: Progressing Goal: Will not experience complications related to urinary retention Outcome: Progressing   Problem: Pain Management: Goal: General experience of comfort will improve Outcome: Progressing   Problem: Safety: Goal: Ability to remain free from injury will improve Outcome: Progressing   Problem: Skin Integrity: Goal: Risk for impaired skin integrity will decrease Outcome: Progressing   Problem: Education: Goal: Ability to describe self-care measures that may prevent or decrease complications (Diabetes Survival Skills Education) will improve Outcome: Progressing Goal: Individualized Educational Video(s) Outcome: Progressing   Problem: Coping: Goal: Ability to adjust to condition or change in health will improve Outcome: Progressing   Problem: Fluid Volume: Goal: Ability to  maintain a balanced intake and output will improve Outcome: Progressing   Problem: Health Behavior/Discharge Planning: Goal: Ability to identify and utilize available resources and services will improve Outcome: Progressing Goal: Ability to manage health-related needs will improve Outcome: Progressing   Problem: Metabolic: Goal: Ability to maintain appropriate glucose levels will improve Outcome: Progressing   Problem: Nutritional: Goal: Maintenance of adequate nutrition will improve Outcome: Progressing Goal: Progress toward achieving an optimal weight will improve Outcome: Progressing   Problem: Skin Integrity: Goal: Risk for impaired skin integrity will decrease Outcome: Progressing   Problem: Tissue Perfusion: Goal: Adequacy of tissue perfusion will improve Outcome: Progressing   Problem: Education: Goal: Knowledge of disease or condition will improve Outcome: Progressing Goal: Knowledge of secondary prevention will improve (MUST DOCUMENT ALL) Outcome: Progressing Goal: Knowledge of patient specific risk factors will improve Loraine Leriche N/A or DELETE if not current risk factor) Outcome: Progressing   Problem: Ischemic Stroke/TIA Tissue Perfusion: Goal: Complications of ischemic stroke/TIA will be minimized Outcome: Progressing   Problem: Coping: Goal: Will verbalize positive feelings about self Outcome: Progressing Goal: Will identify appropriate support needs Outcome: Progressing   Problem: Health Behavior/Discharge Planning: Goal: Ability to manage health-related needs will improve Outcome: Progressing Goal: Goals will be collaboratively established with patient/family Outcome: Progressing   Problem: Self-Care: Goal: Ability to participate in self-care as condition permits will improve Outcome: Progressing Goal: Verbalization of feelings and concerns over difficulty with self-care will improve Outcome: Progressing Goal: Ability to communicate needs accurately  will improve Outcome: Progressing   Problem: Nutrition: Goal: Risk of aspiration will decrease Outcome: Progressing Goal: Dietary intake will improve Outcome: Progressing

## 2023-03-18 NOTE — Progress Notes (Signed)
Physical Therapy Treatment Patient Details Name: [name] [name] MRN: [id number] DOB: 1945/05/21 Today's Date: 03/18/2023   History of Present Illness Pt is a 77 y.o. female who presented to ED with c/o slurred speech, confusion, and R facial droop. CT and MRI negative. Pt admitted for acute encephalopathy. PMH:  CAD, chronic diastolic CHF, DM2, HTN, hyperlipidemia AR s/p bioprosthetic valve repair, CABG in 2015    PT Comments  Pt tolerated treatment well today. Pt able to transfer to chair with Min A HHA however further limited by pt on LTM EEG. No change in DC/DME recs at this time.  PT will continue to follow.    If plan is discharge home, recommend the following: Two people to help with walking and/or transfers;Two people to help with bathing/dressing/bathroom   Can travel by private vehicle     Yes  Equipment Recommendations  Other (comment) (Per accepting facility)    Recommendations for Other Services       Precautions / Restrictions Precautions Precautions: Fall;Other (comment) Precaution Comments: undergoing LTM EEG at time of eval Restrictions Weight Bearing Restrictions: No     Mobility  Bed Mobility Overal bed mobility: Needs Assistance Bed Mobility: Supine to Sit     Supine to sit: Contact guard     General bed mobility comments: Increased time. No physical assist provided.    Transfers Overall transfer level: Needs assistance Equipment used: 1 person hand held assist Transfers: Sit to/from Stand, Bed to chair/wheelchair/BSC Sit to Stand: Contact guard assist   Step pivot transfers: Min assist       General transfer comment: Pt noted to stand impulsively multiple times while therapist attempted gather lines and leads. 1 LOB noted when transferring requiring Min A to correct.    Ambulation/Gait               General Gait Details: deferred due to cognition and undergoing EEG   Stairs             Wheelchair Mobility     Tilt Bed     Modified Rankin (Stroke Patients Only)       Balance Overall balance assessment: Needs assistance Sitting-balance support: Single extremity supported, Feet supported Sitting balance-Leahy Scale: Fair     Standing balance support: Bilateral upper extremity supported, During functional activity Standing balance-Leahy Scale: Poor Standing balance comment: reliant on external support. Static stand x 10 seconds mod assist.                            Cognition Arousal: Alert Behavior During Therapy: Impulsive, Restless, Flat affect Overall Cognitive Status: Impaired/Different from baseline Area of Impairment: Orientation, Attention, Memory, Following commands, Safety/judgement, Awareness, Problem solving                 Orientation Level: Place, Time, Situation Current Attention Level: Focused Memory: Decreased recall of precautions, Decreased short-term memory Following Commands: Follows one step commands inconsistently Safety/Judgement: Decreased awareness of safety, Decreased awareness of deficits Awareness: Intellectual Problem Solving: Slow processing, Decreased initiation, Difficulty sequencing, Requires verbal cues, Requires tactile cues General Comments: Would utter a few words with increased time, would follow commands 50% of the time with increased time to process, pulling at lines and leads, restless. Very flat affect. Would stand multiple times while therapist was adjusting lines and leads.        Exercises      General Comments General comments (skin integrity, edema, etc.): VSS on RA  Pertinent Vitals/Pain Pain Assessment Pain Assessment: No/denies pain    Home Living                          Prior Function            PT Goals (current goals can now be found in the care plan section) Progress towards PT goals: Progressing toward goals    Frequency    Min 1X/week      PT Plan      Co-evaluation               AM-PAC PT "6 Clicks" Mobility   Outcome Measure  Help needed turning from your back to your side while in a flat bed without using bedrails?: A Little Help needed moving from lying on your back to sitting on the side of a flat bed without using bedrails?: A Little Help needed moving to and from a bed to a chair (including a wheelchair)?: A Little Help needed standing up from a chair using your arms (e.g., wheelchair or bedside chair)?: A Little Help needed to walk in hospital room?: A Lot Help needed climbing 3-5 steps with a railing? : Total 6 Click Score: 15    End of Session Equipment Utilized During Treatment: Gait belt Activity Tolerance: Patient tolerated treatment well Patient left: in chair;with call bell/phone within reach;with chair alarm set;with family/visitor present Nurse Communication: Mobility status PT Visit Diagnosis: Other abnormalities of gait and mobility (R26.89)     Time: 1191-4782 PT Time Calculation (min) (ACUTE ONLY): 13 min  Charges:    $Therapeutic Activity: 8-22 mins PT General Charges $$ ACUTE PT VISIT: 1 Visit                     Shela Nevin, PT, DPT Acute Rehab Services [phone]    [name] 03/18/2023, 12:18 PM

## 2023-03-18 NOTE — Progress Notes (Signed)
MB reattached FP2 electrode and checked all others. Pt continues to "pick Off" frontal electrodes and was reminded to please not touch or scratch at or around the electrodes.

## 2023-03-18 NOTE — Progress Notes (Addendum)
PROGRESS NOTE                                                                                                                                                                                                             Patient Demographics:    [name] [name], is a 77 y.o. female, DOB - 1945/07/16, WFU:[id number]  Outpatient Primary MD for the patient is Thana Ates, MD    LOS - 2  Admit date - 03/15/2023    Chief Complaint  Patient presents with   Aphasia       Brief Narrative (HPI from H&P)    77 y.o. female with PMH significant of coronary artery disease, chronic diastolic CHF, diabetes mellitus 2, HTN, hyperlipidemia AR s/p bioprosthetic valve repair, CABG in 2015 presented to ED with sudden confusion and aphasia this morning.  History was obtained from patient's husband and daughter at the bedside.  Per husband, she was in her normal state of health until last night until 10 PM.  This morning, around 8:15 am, he noted that she was confused, was trying to make coffee however had not put the water in the coffee machine.  He noted that her speech was slurred and then dropped as 4 out of 5 right hand.  She was also noted to have right facial droop, came to the ER and was seen by neurology and admitted to the hospital for further workup.   Subjective:    [name] [name] today she denies any complaints today, no significant events as discussed with staff.   Assessment  & Plan :    Severe toxic and metabolic encephalopathy present on admission.  -Patient underwent stroke workup including MRI brain, CTA head and neck, P shows unremarkable CSF, overnight EEG raises question of some epileptogenicity. - Per patient's family, she has been having memory issues for last 6 to 9 months, ambulates with a cane,  -B1 pending.   -Use Haldol for as needed agitation avoid benzodiazepines.  - UA was unremarkable, stable ammonia, stable  TSH, - low B12 - replaced.  Neurology on board and following. -Concern for seizures, she remains on LTM, seizure meds has been adjusted by neurology, she remains with epileptiform activity .   Essential hypertension, benign  -Hold antihypertensives, continue gentle hydration  B12 deficiency -started on supplements     Chronic  diastolic CHF (congestive heart failure), NYHA class 2 (HCC) - 2D echo 04/28/2022 had shown EF 55 to 60%, G2 DD, mild aortic valve regurgitation, 23 mm valve present, No complaints of recent chest pain, shortness of breath or any cardiac symptoms   Dyslipidemia, goal LDL below 70 - Currently n.p.o., follow lipid panel   Hypokalemia and hypomagnesemia.  Replaced   Fever -Septic workup is negative, will DC IV antibiotic today  GERD  -  PPI   Diabetes Mellitus type II, NIDDM uncontrolled with hyperglycemia - ISS  Lab Results  Component Value Date   HGBA1C 7.1 (H) 03/16/2023   CBG (last 3)  Recent Labs    03/18/23 0348 03/18/23 0817 03/18/23 1258  GLUCAP 144* 166* 255*        Condition - Extremely Guarded  Family Communication  : Discussed with husband at bedside Code Status :  Full  Consults  :  Neurology  PUD Prophylaxis :    Procedures  :     Overnight EEG 03/17/23 - This study showed evidence of epileptogenicity arising from left frontal region. Additionally there was cortical dysfunction arising from left hemisphere likely secondary to underlying structural abnormality, post-ictal state. No seizures were seen throughout the recording.   LP by IR 03/16/2023.  Stable CSF rules out any infection.    MRI - Motion degraded exam. Within this limitation, negative for an acute infarct  EEG - Non acute  CT Head -   1. No acute cortically based infarct or acute intracranial hemorrhage identified. ASPECTS 10. 2. Moderately advanced cerebral white matter changes most commonly due to small vessel disease.   CTA Head -   1. Negative for large vessel  occlusion. 2. ICA siphon calcified atherosclerosis with up to Moderate bilateral siphon stenosis. 3. Extracranial generalized arterial tortuosity but generally mild extracranial atherosclerosis. No other hemodynamically significant arterial stenosis. Aortic Atherosclerosis (ICD10-I70.0). 4. Evidence of Pulmonary Edema in the visible upper chest.       Disposition Plan  :    Status is: Observation  DVT Prophylaxis  :    enoxaparin (LOVENOX) injection 40 mg Start: 03/15/23 2115    Lab Results  Component Value Date   PLT 173 03/18/2023    Diet :  Diet Order             Diet regular Room service appropriate? Yes with Assist; Fluid consistency: Thin  Diet effective now                    Inpatient Medications  Scheduled Meds:  aspirin  81 mg Oral Daily   Or   aspirin  150 mg Rectal Daily   [START ON 03/19/2023] vitamin B-12  1,000 mcg Oral Daily   enoxaparin (LOVENOX) injection  40 mg Subcutaneous Q24H   Gerhardt's butt cream   Topical BID   insulin aspart  0-9 Units Subcutaneous Q4H   levETIRAcetam  750 mg Oral BID   Continuous Infusions:  levETIRAcetam 750 mg (03/17/23 2204)   PRN Meds:.acetaminophen **OR** acetaminophen (TYLENOL) oral liquid 160 mg/5 mL **OR** acetaminophen, haloperidol lactate    Objective:   Vitals:   03/17/23 2350 03/18/23 0059 03/18/23 0400 03/18/23 0800  BP: 120/63  98/62 (!) 148/74  Pulse: 87  88 84  Resp: (!) 26  17 (!) 21  Temp:  98.1 F (36.7 C) 98 F (36.7 C) 98 F (36.7 C)  TempSrc:  Oral Axillary Oral  SpO2: 99%  96%   Weight:  Height:        Wt Readings from Last 3 Encounters:  03/15/23 73.1 kg  04/17/22 74.8 kg  08/20/20 74.8 kg     Intake/Output Summary (Last 24 hours) at 03/18/2023 1348 Last data filed at 03/18/2023 0900 Gross per 24 hour  Intake 240 ml  Output 1100 ml  Net -860 ml     Physical Exam  Awake Alert, he is more appropriate and conversant today, frail. Symmetrical Chest wall movement,  Good air movement bilaterally, CTAB RRR,No Gallops,Rubs or new Murmurs, No Parasternal Heave +ve B.Sounds, Abd Soft, No tenderness, No rebound - guarding or rigidity. No Cyanosis, Clubbing or edema, No new Rash or bruise         Data Review:    Recent Labs  Lab 03/15/23 1027 03/15/23 1842 03/16/23 0459 03/17/23 0442 03/18/23 0550  WBC 9.9  --  12.8* 8.9 9.7  HGB 12.8 12.2 11.8* 12.5 12.3  HCT 37.5 36.0 34.9* 36.9 35.5*  PLT 199  --  178 166 173  MCV 95.2  --  93.6 93.9 92.4  MCH 32.5  --  31.6 31.8 32.0  MCHC 34.1  --  33.8 33.9 34.6  RDW 13.2  --  12.9 12.8 13.2  LYMPHSABS 2.7  --   --  2.8 2.2  MONOABS 0.6  --   --  0.8 0.9  EOSABS 0.2  --   --  0.4 0.5  BASOSABS 0.1  --   --  0.1 0.1    Recent Labs  Lab 03/15/23 1027 03/15/23 1840 03/15/23 1842 03/15/23 2105 03/16/23 0459 03/16/23 0555 03/17/23 0442 03/18/23 0550  NA 135  --  134*  --   --   --  137 131*  K 4.2  --  3.8  --   --   --  3.4* 3.8  CL 98  --  101  --   --   --  103 108  CO2 27  --   --   --   --   --  21* 18*  ANIONGAP 10  --   --   --   --   --  13 5  GLUCOSE 180*  --  101*  --   --   --  95 134*  BUN 13  --  8  --   --   --  7* 10  CREATININE 0.74  --  0.70 0.78  --   --  0.85 0.86  AST 21  --   --   --   --   --   --   --   ALT 13  --   --   --   --   --   --   --   ALKPHOS 46  --   --   --   --   --   --   --   BILITOT 0.8  --   --   --   --   --   --   --   ALBUMIN 4.5  --   --   --   --   --   --   --   CRP  --   --   --   --   --  <0.5 2.0* 3.6*  PROCALCITON  --  <0.10  --   --   --  <0.10 <0.10 <0.10  INR 1.1  --   --   --   --   --   --   --  TSH  --   --   --  1.086  --   --   --   --   HGBA1C  --   --   --   --  7.1*  --   --   --   AMMONIA  --   --   --   --   --  19  --   --   BNP  --   --   --   --   --  474.9* 167.1* 90.6  MG  --   --   --   --   --   --  1.3* 2.3  CALCIUM 10.0  --   --   --   --   --  8.8* 8.5*      Recent Labs  Lab 03/15/23 1027 03/15/23 1840  03/15/23 2105 03/16/23 0459 03/16/23 0555 03/17/23 0442 03/18/23 0550  CRP  --   --   --   --  <0.5 2.0* 3.6*  PROCALCITON  --  <0.10  --   --  <0.10 <0.10 <0.10  INR 1.1  --   --   --   --   --   --   TSH  --   --  1.086  --   --   --   --   HGBA1C  --   --   --  7.1*  --   --   --   AMMONIA  --   --   --   --  19  --   --   BNP  --   --   --   --  474.9* 167.1* 90.6  MG  --   --   --   --   --  1.3* 2.3  CALCIUM 10.0  --   --   --   --  8.8* 8.5*    Lab Results  Component Value Date   CHOL 111 03/16/2023   HDL 59 03/16/2023   LDLCALC 31 03/16/2023   TRIG 107 03/16/2023   CHOLHDL 1.9 03/16/2023    Lab Results  Component Value Date   HGBA1C 7.1 (H) 03/16/2023   Recent Labs    03/15/23 2105  TSH 1.086   Recent Labs    03/15/23 2105 03/16/23 0459  VITAMINB12 117*  --   FOLATE  --  38.6      Micro Results Recent Results (from the past 240 hour(s))  Respiratory (~20 pathogens) panel by PCR     Status: None   Collection Time: 03/15/23  5:33 PM   Specimen: Nasopharyngeal Swab; Respiratory  Result Value Ref Range Status   Adenovirus NOT DETECTED NOT DETECTED Final   Coronavirus 229E NOT DETECTED NOT DETECTED Final    Comment: (NOTE) The Coronavirus on the Respiratory Panel, DOES NOT test for the novel  Coronavirus (2019 nCoV)    Coronavirus HKU1 NOT DETECTED NOT DETECTED Final   Coronavirus NL63 NOT DETECTED NOT DETECTED Final   Coronavirus OC43 NOT DETECTED NOT DETECTED Final   Metapneumovirus NOT DETECTED NOT DETECTED Final   Rhinovirus / Enterovirus NOT DETECTED NOT DETECTED Final   Influenza A NOT DETECTED NOT DETECTED Final   Influenza B NOT DETECTED NOT DETECTED Final   Parainfluenza Virus 1 NOT DETECTED NOT DETECTED Final   Parainfluenza Virus 2 NOT DETECTED NOT DETECTED Final   Parainfluenza Virus 3 NOT DETECTED NOT DETECTED Final   Parainfluenza Virus 4 NOT DETECTED NOT DETECTED Final  Respiratory Syncytial Virus NOT DETECTED NOT DETECTED Final    Bordetella pertussis NOT DETECTED NOT DETECTED Final   Bordetella Parapertussis NOT DETECTED NOT DETECTED Final   Chlamydophila pneumoniae NOT DETECTED NOT DETECTED Final   Mycoplasma pneumoniae NOT DETECTED NOT DETECTED Final    Comment: Performed at [facility], [address] [place], [place] [address]  Resp panel by RT-PCR (RSV, Flu A&B, Covid) Nasopharyngeal Swab     Status: None   Collection Time: 03/15/23  5:33 PM   Specimen: Nasopharyngeal Swab; Nasal Swab  Result Value Ref Range Status   SARS Coronavirus 2 by RT PCR NEGATIVE NEGATIVE Final   Influenza A by PCR NEGATIVE NEGATIVE Final   Influenza B by PCR NEGATIVE NEGATIVE Final    Comment: (NOTE) The Xpert Xpress SARS-CoV-2/FLU/RSV plus assay is intended as an aid in the diagnosis of influenza from Nasopharyngeal swab specimens and should not be used as a sole basis for treatment. Nasal washings and aspirates are unacceptable for Xpert Xpress SARS-CoV-2/FLU/RSV testing.  Fact Sheet for Patients: BloggerCourse.com  Fact Sheet for Healthcare Providers: SeriousBroker.it  This test is not yet approved or cleared by the [place] FDA and has been authorized for detection and/or diagnosis of SARS-CoV-2 by FDA under an Emergency Use Authorization (EUA). This EUA will remain in effect (meaning this test can be used) for the duration of the COVID-19 declaration under Section 564(b)(1) of the Act, 21 U.S.C. section 360bbb-3(b)(1), unless the authorization is terminated or revoked.     Resp Syncytial Virus by PCR NEGATIVE NEGATIVE Final    Comment: (NOTE) Fact Sheet for Patients: BloggerCourse.com  Fact Sheet for Healthcare Providers: SeriousBroker.it  This test is not yet approved or cleared by the [place] FDA and has been authorized for detection and/or diagnosis of SARS-CoV-2 by FDA under an Emergency Use  Authorization (EUA). This EUA will remain in effect (meaning this test can be used) for the duration of the COVID-19 declaration under Section 564(b)(1) of the Act, 21 U.S.C. section 360bbb-3(b)(1), unless the authorization is terminated or revoked.  Performed at [facility], [address] [place], [place] [address]   Culture, blood (Routine X 2) w Reflex to ID Panel     Status: None (Preliminary result)   Collection Time: 03/15/23  6:40 PM   Specimen: BLOOD RIGHT HAND  Result Value Ref Range Status   Specimen Description BLOOD RIGHT HAND  Final   Special Requests   Final    BOTTLES DRAWN AEROBIC ONLY Blood Culture results may not be optimal due to an inadequate volume of blood received in culture bottles   Culture   Final    NO GROWTH 3 DAYS Performed at [facility], [address] [place], [place] [address]    Report Status PENDING  Incomplete  Culture, blood (Routine X 2) w Reflex to ID Panel     Status: None (Preliminary result)   Collection Time: 03/16/23  4:59 AM   Specimen: BLOOD RIGHT HAND  Result Value Ref Range Status   Specimen Description BLOOD RIGHT HAND  Final   Special Requests   Final    BOTTLES DRAWN AEROBIC AND ANAEROBIC Blood Culture adequate volume   Culture   Final    NO GROWTH 2 DAYS Performed at [facility], [address] [place], [place] [address]    Report Status PENDING  Incomplete  CSF culture w Gram Stain     Status: None (Preliminary result)  Collection Time: 03/16/23  4:42 PM   Specimen: PATH Cytology CSF; Cerebrospinal Fluid  Result Value Ref Range Status   Specimen Description CSF  Final   Special Requests NONE  Final   Gram Stain   Final    WBC PRESENT, PREDOMINANTLY PMN NO ORGANISMS SEEN CYTOSPIN SMEAR    Culture   Final    NO GROWTH 2 DAYS Performed at [facility], [address], [place], [place] [address]    Report Status PENDING  Incomplete    Radiology Reports MR BRAIN W WO CONTRAST  Result  Date: 03/17/2023 CLINICAL DATA:  Neuro deficit, acute, stroke suspected. EXAM: MRI HEAD WITHOUT AND WITH CONTRAST TECHNIQUE: Multiplanar, multiecho pulse sequences of the brain and surrounding structures were obtained without and with intravenous contrast. CONTRAST:  7mL GADAVIST GADOBUTROL 1 MMOL/ML IV SOLN COMPARISON:  MRI of the brain March 15, 2023. FINDINGS: Brain: No acute infarction, hydrocephalus, extra-axial collection or mass lesion. Small foci of susceptibility artifact in the bilateral occipital horns and left temporal horn of the lateral ventricles as well as in the left frontal lobe. Scattered and confluent foci of T2 hyperintensity are seen within white of the cerebral hemispheres, nonspecific. Small remote chronic infarcts in the bilateral centrum semiovale, corona radiata and chronic cortical infarct in the left frontal lobe. Focus of hemosiderin deposit in the left temporal lobe. No focus of abnormal contrast enhancement. Vascular: Normal flow voids. Skull and upper cervical spine: Normal marrow signal. Sinuses/Orbits: Negative. Other: Bilateral mastoid effusion. IMPRESSION: 1. No acute intracranial abnormality. 2. Mild chronic white matter disease. 3. Small remote chronic infarcts in the bilateral centrum semiovale, corona radiata and chronic cortical infarct in the left frontal lobe. 4. Small foci of hemosiderin deposit in the bilateral occipital horns and left temporal horn of the lateral ventricles as well as in the left frontal lobe. 5. Bilateral mastoid effusion. Electronically Signed   By: Baldemar Lenis M.D.   On: 03/17/2023 16:34   DG FL GUIDED LUMBAR PUNCTURE  Result Date: 03/16/2023 CLINICAL DATA:  Currently admitted for altered mental status. Team is requesting lumbar puncture for further evaluation EXAM: LUMBAR PUNCTURE UNDER FLUOROSCOPY PROCEDURE: An appropriate skin entry site was determined fluoroscopically. Operator donned sterile gloves and mask. Skin site  was marked, then prepped with Betadine, draped in usual sterile fashion, and infiltrated locally with 1% lidocaine. A 20 gauge spinal needle advanced into the thecal sac at L5-S1 from a left interlaminar approach. Clear colorless CSF spontaneously returned, with opening pressure of 9 cm water. 13 ml CSF were collected and divided among 4 sterile vials for the requested laboratory studies. The needle was then removed. The patient tolerated the procedure well and there were no complications. FLUOROSCOPY: Radiation Exposure Index (as provided by the fluoroscopic device): 3.5 mGy Kerma IMPRESSION: Technically successful lumbar puncture under fluoroscopy. This exam was performed by Anders Grant NP, and was supervised and interpreted by Dr. Ruthy Dick Electronically Signed   By: Gilmer Mor D.O.   On: 03/16/2023 16:58   ECHOCARDIOGRAM COMPLETE  Result Date: 03/16/2023    ECHOCARDIOGRAM REPORT   Patient Name:   MAYKAYLA RAWN Date of Exam: 03/16/2023 Medical Rec #:  [id number]        Height:       60.5 in Accession #:    [id number]       Weight:       161.2 lb Date of Birth:  06-02-1945  BSA:          1.713 m Patient Age:    77 years         BP:           134/62 mmHg Patient Gender: F                HR:           74 bpm. Exam Location:  Inpatient Procedure: 2D Echo, Cardiac Doppler and Color Doppler Indications:    TIA G45.9  History:        Patient has prior history of Echocardiogram examinations, most                 recent 04/28/2022. CHF, CAD, Prior CABG, TIA, Arrythmias:PVC;                 Risk Factors:Hypertension, Diabetes and Dyslipidemia.                 Aortic Valve: 23 mm Magna valve is present in the aortic                 position. Procedure Date: 10/27/2013.  Sonographer:    Lucendia Herrlich RCS Referring Phys: Delene Ruffini RAI  Sonographer Comments: Image acquisition challenging due to uncooperative patient. IMPRESSIONS  1. Left ventricular ejection fraction, by estimation, is 40 to 45%. The  left ventricle has mildly decreased function. The left ventricle demonstrates global hypokinesis. The left ventricular internal cavity size was moderately dilated. Left ventricular diastolic parameters were normal.  2. Right ventricular systolic function is normal. The right ventricular size is normal.  3. Left atrial size was moderately dilated.  4. The mitral valve is abnormal. Mild mitral valve regurgitation. No evidence of mitral stenosis.  5. Post AVR with 23 mm bioprosthetic Magna Ease valve . No PVL mean gradient 11 mmHg. The aortic valve has been repaired/replaced. Aortic valve regurgitation is not visualized. No aortic stenosis is present. There is a 23 mm Magna valve present in the aortic position. Procedure Date: 10/27/2013.  6. The inferior vena cava is normal in size with greater than 50% respiratory variability, suggesting right atrial pressure of 3 mmHg. FINDINGS  Left Ventricle: Left ventricular ejection fraction, by estimation, is 40 to 45%. The left ventricle has mildly decreased function. The left ventricle demonstrates global hypokinesis. The left ventricular internal cavity size was moderately dilated. There is no left ventricular hypertrophy. Left ventricular diastolic parameters were normal. Right Ventricle: The right ventricular size is normal. No increase in right ventricular wall thickness. Right ventricular systolic function is normal. Left Atrium: Left atrial size was moderately dilated. Right Atrium: Right atrial size was normal in size. Pericardium: There is no evidence of pericardial effusion. Mitral Valve: The mitral valve is abnormal. There is mild thickening of the mitral valve leaflet(s). There is mild calcification of the mitral valve leaflet(s). Mild mitral annular calcification. Mild mitral valve regurgitation. No evidence of mitral valve stenosis. Tricuspid Valve: The tricuspid valve is normal in structure. Tricuspid valve regurgitation is mild . No evidence of tricuspid  stenosis. Aortic Valve: Post AVR with 23 mm bioprosthetic Magna Ease valve . No PVL mean gradient 11 mmHg. The aortic valve has been repaired/replaced. Aortic valve regurgitation is not visualized. No aortic stenosis is present. Aortic valve mean gradient measures  11.0 mmHg. Aortic valve peak gradient measures 21.0 mmHg. Aortic valve area, by VTI measures 1.39 cm. There is a 23 mm Magna valve present in the aortic position. Procedure Date:  10/27/2013. Pulmonic Valve: The pulmonic valve was normal in structure. Pulmonic valve regurgitation is not visualized. No evidence of pulmonic stenosis. Aorta: The aortic root is normal in size and structure. Venous: The inferior vena cava is normal in size with greater than 50% respiratory variability, suggesting right atrial pressure of 3 mmHg. IAS/Shunts: No atrial level shunt detected by color flow Doppler.  LEFT VENTRICLE PLAX 2D LVIDd:         4.95 cm      Diastology LVIDs:         4.10 cm      LV e' medial:    6.74 cm/s LV PW:         0.85 cm      LV E/e' medial:  14.1 LV IVS:        0.80 cm      LV e' lateral:   11.20 cm/s LVOT diam:     1.90 cm      LV E/e' lateral: 8.5 LV SV:         68 LV SV Index:   40 LVOT Area:     2.84 cm  LV Volumes (MOD) LV vol d, MOD A2C: 77.2 ml LV vol d, MOD A4C: 113.0 ml LV vol s, MOD A2C: 41.4 ml LV vol s, MOD A4C: 65.7 ml LV SV MOD A2C:     35.8 ml LV SV MOD A4C:     113.0 ml LV SV MOD BP:      39.2 ml RIGHT VENTRICLE            IVC RV S prime:     9.90 cm/s  IVC diam: 1.90 cm TAPSE (M-mode): 1.8 cm LEFT ATRIUM             Index        RIGHT ATRIUM           Index LA diam:        4.25 cm 2.48 cm/m   RA Area:     11.20 cm LA Vol (A2C):   46.0 ml 26.85 ml/m  RA Volume:   18.90 ml  11.03 ml/m LA Vol (A4C):   52.1 ml 30.41 ml/m LA Biplane Vol: 50.5 ml 29.47 ml/m  AORTIC VALVE AV Area (Vmax):    1.43 cm AV Area (Vmean):   1.47 cm AV Area (VTI):     1.39 cm AV Vmax:           229.00 cm/s AV Vmean:          154.000 cm/s AV VTI:             0.491 m AV Peak Grad:      21.0 mmHg AV Mean Grad:      11.0 mmHg LVOT Vmax:         115.67 cm/s LVOT Vmean:        80.100 cm/s LVOT VTI:          0.241 m LVOT/AV VTI ratio: 0.49  AORTA Ao Root diam: 3.50 cm Ao Asc diam:  2.80 cm MITRAL VALVE MV Area (PHT): 3.12 cm    SHUNTS MV Decel Time: 243 msec    Systemic VTI:  0.24 m MR Peak grad: 60.8 mmHg    Systemic Diam: 1.90 cm MR Vmax:      390.00 cm/s MV E velocity: 94.90 cm/s MV A velocity: 94.90 cm/s MV E/A ratio:  1.00 Charlton Haws MD Electronically signed by Charlton Haws MD Signature Date/Time: 03/16/2023/11:17:54 AM  Final    DG Chest Port 1 View  Result Date: 03/16/2023 CLINICAL DATA:  77 year old female with history of shortness of breath. EXAM: PORTABLE CHEST 1 VIEW COMPARISON:  Chest x-ray 03/15/2023. FINDINGS: Lung volumes are normal. No consolidative airspace disease. No definite pleural effusions. No pneumothorax. No evidence of pulmonary edema. Heart size is normal. Upper mediastinal contours are within normal limits. Atherosclerotic calcifications are noted in the thoracic aorta. Status post median sternotomy for aortic valve replacement (a stented bioprosthesis is noted). IMPRESSION: 1. No radiographic evidence of acute cardiopulmonary disease. 2. Aortic atherosclerosis. Electronically Signed   By: Trudie Reed M.D.   On: 03/16/2023 07:30   Overnight EEG with video  Result Date: 03/16/2023 Charlsie Quest, MD     03/17/2023  9:28 AM Patient Name: [name] [name] MRN: [id number] Epilepsy Attending: Charlsie Quest Referring Physician/Provider: Gordy Councilman, MD Duration: 03/15/2023 1516 to 03/16/2023 2130 Patient history: 77yo F with aphasia getting eeg to evaluate for seizure Level of alertness: Awake, asleep AEDs during EEG study: LEV Technical aspects: This EEG study was done with scalp electrodes positioned according to the 10-20 International system of electrode placement. Electrical activity was reviewed with band pass filter of  1-70Hz , sensitivity of 7 uV/mm, display speed of 31mm/sec with a 60Hz  notched filter applied as appropriate. EEG data were recorded continuously and digitally stored.  Video monitoring was available and reviewed as appropriate. Description: The posterior dominant rhythm consists of 8 Hz activity of moderate voltage (25-35 uV) seen predominantly in posterior head regions, asymmetric ( left < right)and reactive to eye opening and eye closing. Sleep was characterized by vertex waves, sleep spindles (12 to 14 Hz), maximal frontocentral region. EEG showed continuous 2 to 3 Hz delta slowing in left hemisphere. Sharp waves were noted in left frontal region. Hyperventilation and photic stimulation were not performed.   EEG was disconnected between 03/15/2023 1923 to 03/16/2023 0114 due to room transfer ABNORMALITY -Sharp wave, left frontal region. - Continuous slow, left hemisphere IMPRESSION: This study showed evidence of epileptogenicity arising from left frontal region. Additionally there was cortical dysfunction arising from left hemisphere likely secondary to underlying structural abnormality, post-ictal state. No seizures were seen throughout the recording. Priyanka Annabelle Harman   DG CHEST PORT 1 VIEW  Result Date: 03/15/2023 CLINICAL DATA:  Seizure, right-sided facial droop EXAM: PORTABLE CHEST 1 VIEW COMPARISON:  05/02/2020 FINDINGS: Single frontal view of the chest demonstrates postsurgical changes from median sternotomy and aortic valve replacement. Stable cardiac silhouette. Pulmonary vascular congestion without acute airspace disease, effusion, or pneumothorax. No acute bony abnormalities. IMPRESSION: 1. Pulmonary vascular congestion without overt edema. Electronically Signed   By: Sharlet Salina M.D.   On: 03/15/2023 18:59   MR BRAIN WO CONTRAST  Result Date: 03/15/2023 CLINICAL DATA:  Transient ischemic attack (TIA) EXAM: MRI HEAD WITHOUT CONTRAST TECHNIQUE: Multiplanar, multiecho pulse sequences of the brain  and surrounding structures were obtained without intravenous contrast. COMPARISON:  Same day stroke code CT and CTA head/neck FINDINGS: Limitations: Motion degraded exam. Axial FLAIR sequences are essentially nondiagnostic. Brain: Negative for an acute infarct. No hemorrhage. No hydrocephalus. No extra-axial fluid collection. No mass effect. No mass lesion. Vascular: Normal flow voids. Skull and upper cervical spine: Normal marrow signal. Sinuses/Orbits: Small bilateral mastoid effusions. No middle ear effusion. Bilateral lens replacement. Orbits are otherwise unremarkable. Other: None. IMPRESSION: Motion degraded exam. Within this limitation, negative for an acute infarct Electronically Signed   By: Lorenza Cambridge M.D.   On:  03/15/2023 14:36   CT ANGIO HEAD NECK W WO CM (CODE STROKE)  Result Date: 03/15/2023 CLINICAL DATA:  77 year old female code stroke presentation. EXAM: CT ANGIOGRAPHY HEAD AND NECK WITH AND WITHOUT CONTRAST TECHNIQUE: Multidetector CT imaging of the head and neck was performed using the standard protocol during bolus administration of intravenous contrast. Multiplanar CT image reconstructions and MIPs were obtained to evaluate the vascular anatomy. Carotid stenosis measurements (when applicable) are obtained utilizing NASCET criteria, using the distal internal carotid diameter as the denominator. RADIATION DOSE REDUCTION: This exam was performed according to the departmental dose-optimization program which includes automated exposure control, adjustment of the mA and/or kV according to patient size and/or use of iterative reconstruction technique. CONTRAST:  75mL OMNIPAQUE IOHEXOL 350 MG/ML SOLN COMPARISON:  Head CT 1035 hours today. FINDINGS: CTA NECK Skeleton: Sternotomy. Cervical spine degeneration, mostly right side facet arthropathy. No acute osseous abnormality identified. Upper chest: Septal thickening, ground-glass opacity, dependent lung opacity. No apical pleural effusions.  Negative visible mediastinum. Other neck: Negative. Aortic arch: Calcified aortic atherosclerosis.  3 vessel arch. Right carotid system: Brachiocephalic artery and proximal right CCA are tortuous with mild plaque, no stenosis. Mild calcified plaque at the right ICA origin and bulb without stenosis. Highly tortuous right ICA distal to the bulb. Left carotid system: Similar tortuosity. Low-density soft plaque at the ventral left CCA at the level of the larynx without stenosis on series 4, image 111. Otherwise mild proximal ICA plaque without stenosis. Tortuous left ICA distal to the bulb. Vertebral arteries: Tortuous proximal right subclavian artery with soft and calcified plaque but no significant stenosis. Right vertebral artery origin remains normal. Tortuous right V1 segment and mildly late entry of the right vertebral into the transverse foramen. Right vertebral is patent to the skull base without stenosis. Tortuous and atherosclerotic proximal left subclavian artery with a kinked appearance at the thoracic inlet but no atherosclerotic stenosis. Left vertebral artery origin is tortuous without stenosis. Left vertebral artery appears mildly dominant and is patent to the skull base with no significant plaque or stenosis. CTA HEAD Posterior circulation: Distal vertebral arteries and vertebrobasilar junction are patent without stenosis. Normal PICA origins. Patent basilar artery without stenosis. Patent SCA and PCA origins. Posterior communicating arteries are diminutive or absent. Bilateral PCA branches are within normal limits, there is mild right PCA P2 segment irregularity. Anterior circulation: Both ICA siphons are patent. Left siphon is heavily calcified with mild to moderate stenosis at the anterior genu. Right siphon is heavily calcified in the supraclinoid segment with mild-to-moderate stenosis. Patent carotid termini, MCA and ACA origins. Dominant left and diminutive right ACA A1 segments. Normal anterior  communicating artery. Bilateral ACA branches are within normal limits. Left MCA M1 segment is patent without stenosis. Left MCA trifurcation is mildly ectatic, patent without stenosis. Left MCA branches are within normal limits. Right MCA M1 segment and bifurcation are patent without stenosis. Right MCA branches are within normal limits. Venous sinuses: Not evaluated due to early contrast timing. Anatomic variants: Dominant left ACA A1. Mildly dominant left vertebral artery. Review of the MIP images confirms the above findings IMPRESSION: 1. Negative for large vessel occlusion. 2. ICA siphon calcified atherosclerosis with up to Moderate bilateral siphon stenosis. 3. Extracranial generalized arterial tortuosity but generally mild extracranial atherosclerosis. No other hemodynamically significant arterial stenosis. Aortic Atherosclerosis (ICD10-I70.0). 4. Evidence of Pulmonary Edema in the visible upper chest. Electronically Signed   By: Odessa Fleming M.D.   On: 03/15/2023 11:54   CT HEAD CODE  STROKE WO CONTRAST  Addendum Date: 03/15/2023   ADDENDUM REPORT: 03/15/2023 11:17 ADDENDUM: Study discussed by telephone with PA BROOKE SMALL on 03/15/2023 at 1106 hours. Electronically Signed   By: Odessa Fleming M.D.   On: 03/15/2023 11:17   Result Date: 03/15/2023 CLINICAL DATA:  Code stroke. 77 year old female last known well 2200 hours. Slurred speech, aphasia. EXAM: CT HEAD WITHOUT CONTRAST TECHNIQUE: Contiguous axial images were obtained from the base of the skull through the vertex without intravenous contrast. RADIATION DOSE REDUCTION: This exam was performed according to the departmental dose-optimization program which includes automated exposure control, adjustment of the mA and/or kV according to patient size and/or use of iterative reconstruction technique. COMPARISON:  None Available. FINDINGS: Brain: No midline shift, mass effect, or evidence of intracranial mass lesion. No ventriculomegaly. No acute intracranial  hemorrhage identified. Cerebral volume is within normal limits for age. Patchy and confluent bilateral white matter hypodensity with some deep white matter involvement in both hemispheres. Deep gray nuclei relatively spared. No cortically based acute infarct identified. No cortical encephalomalacia identified. Vascular: Calcified atherosclerosis at the skull base. No suspicious intracranial vascular hyperdensity. Skull: No acute osseous abnormality identified. Sinuses/Orbits: Visualized paranasal sinuses and mastoids are clear. Other: No gaze deviation. Postoperative changes to the globes. Visualized scalp soft tissues are within normal limits. ASPECTS [facility] Stroke Program Early CT Score) Total score (0-10 with 10 being normal): 10 IMPRESSION: 1. No acute cortically based infarct or acute intracranial hemorrhage identified. ASPECTS 10. 2. Moderately advanced cerebral white matter changes most commonly due to small vessel disease. Electronically Signed: By: Odessa Fleming M.D. On: 03/15/2023 11:04      Signature  -   Mliss Fritz Charisma Charlot M.D on 03/18/2023 at 1:48 PM   -  To page go to [email or web address]

## 2023-03-18 NOTE — Progress Notes (Signed)
NEUROLOGY CONSULT FOLLOW UP NOTE   Date of service: March 18, 2023 Patient Name: [name] [name] MRN:  [id number] DOB:  06/17/1945  Brief HPI  [name] [name] is a 77 y.o. female with PMH significant for DM, HTN, HLD, Chronic Diastolic CHF, s/p CABG x1 2015, s/p aortic root replacement, CAD, Thyroiditis who presented 12/8 with AMS and difficulty speaking. She was started on antibiotics due to concern for meningitis. LP was completed, negative for infection.    Interval Hx/subjective   Husband at bedside.  WBC improved from 12.8 on admission to 9.7 today,. Afebrile.  Workup negative  She is alert, oriented and talkative this morning. Some slowed responses with continued questioning.  LTM continues to show sharp waves in left frontal region.    Vitals   Vitals:   03/17/23 2200 03/17/23 2350 03/18/23 0059 03/18/23 0400  BP: 136/67 120/63  98/62  Pulse: 90 87  88  Resp: (!) 22 (!) 26  17  Temp:   98.1 F (36.7 C) 98 F (36.7 C)  TempSrc:   Oral Axillary  SpO2: 99% 99%  96%  Weight:      Height:        Body mass index is 30.96 kg/m.  Physical Exam   Constitutional:  Ill appearing, elderly, caucasian female Psych: Patient is calm and cooperative with exam, does try and scratch at her EEG wires requiring redirection  Eyes: No scleral injection.  HENT: No OP obstrucion.  Head: Normocephalic.  Cardiovascular: Normal rate and regular rhythm.  Respiratory: Effort normal, non-labored breathing on room air GI: Soft.  No distension. There is no tenderness.  Skin: WDI.   Neurologic Examination   Neuro: Mental Status: Patient is awake, alert, oriented to person, place, month, year, and situation. Patient is able to give a clear and coherent history. No signs of aphasia or neglect. Some dysarthria with continued questioning. 1/3 in short term memory testing. She is unable to do small calculations or spell the word world backwards.  Naming and repetition are intact for 1-2  times, then dysarthria increases and ability to name and repeat decrease.  Cranial Nerves: II: Visual Fields are full. Pupils are equal, round, and reactive to light.   III,IV, VI: EOMI without ptosis or diploplia.  V: Facial sensation is symmetric to temperature VII: Facial movement is symmetric.  VIII: hearing is intact to voice X: Uvula elevates symmetrically XI: Shoulder shrug is symmetric. XII: tongue is midline without atrophy or fasciculations.  Motor: Tone is normal. Bulk is normal. Generalized weakness, 4+/5 strength was present in all four extremities.  Sensory: Sensation is symmetric to light touch and temperature in the arms and legs. Cerebellar: FNF and HKS are slow, but intact bilaterally  Labs and Diagnostic Imaging   CBC:  Recent Labs  Lab 03/17/23 0442 03/18/23 0550  WBC 8.9 9.7  NEUTROABS 4.8 6.1  HGB 12.5 12.3  HCT 36.9 35.5*  MCV 93.9 92.4  PLT 166 173   Basic Metabolic Panel:  Lab Results  Component Value Date   NA 131 (L) 03/18/2023   K 3.8 03/18/2023   CO2 18 (L) 03/18/2023   GLUCOSE 134 (H) 03/18/2023   BUN 10 03/18/2023   CREATININE 0.86 03/18/2023   CALCIUM 8.5 (L) 03/18/2023   GFRNONAA >60 03/18/2023   GFRAA >60 05/29/2017   Lipid Panel:  Lab Results  Component Value Date   LDLCALC 31 03/16/2023   HgbA1c:  Lab Results  Component Value Date   HGBA1C 7.1 (  H) 03/16/2023   Urine Drug Screen:     Component Value Date/Time   LABOPIA NONE DETECTED 03/15/2023 1810   COCAINSCRNUR NONE DETECTED 03/15/2023 1810   LABBENZ NONE DETECTED 03/15/2023 1810   AMPHETMU NONE DETECTED 03/15/2023 1810   THCU NONE DETECTED 03/15/2023 1810   LABBARB NONE DETECTED 03/15/2023 1810    Alcohol Level     Component Value Date/Time   ETH <10 03/15/2023 1027   INR  Lab Results  Component Value Date   INR 1.1 03/15/2023   APTT  Lab Results  Component Value Date   APTT 31 03/15/2023   AED levels: No results found for: "PHENYTOIN", "ZONISAMIDE",  "LAMOTRIGINE", "LEVETIRACETA"  CT Head without contrast(Personally reviewed):  No acute cortically based infarct or acute intracranial hemorrhage identified. ASPECTS 10.  CT angio Head and Neck with contrast(Personally reviewed): Negative for large vessel occlusion. ICA siphon calcified atherosclerosis with up to Moderate bilateral siphon stenosis.  MRI Brain(Personally reviewed): Motion degraded exam. Within this limitation, negative for an acute Infarct. Small bilateral mastoid effusions.   Repeat MRI Brain w/ and w/o: No acute abnormality. Mild chronic white matter disease Small remote chronic infarct in bilateral centrum semiovale, corona radiata. 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   cEEG: 12/8-12/9  This study showed evidence of epileptogenicity arising from left frontal region. Additionally there was cortical dysfunction arising from left hemisphere likely secondary to underlying structural abnormality, post-ictal state. No seizures or epileptiform discharges were seen throughout the recording.   cEEG 12/9 - 12/10: "This study showed evidence of epileptogenicity arising from left frontal region. Additionally there was cortical dysfunction arising from left hemisphere likely secondary to underlying structural abnormality, post-ictal state. No seizures were seen throughout the recording."  cEEG 12/10-12/11: This study showed evidence of epileptogenicity arising from left frontal region. Additionally there was cortical dysfunction arising from left hemisphere likely secondary to underlying structural abnormality, post-ictal state. No seizures were seen throughout the recording.   Labs UA negative.  UDS negative WBC 12.8 -> 8.9 TSH 1.086 Vit B12 117 Procal, CRP negative  Folate 38.6 Ammonia 19 Thiamine pending   CRP: elevated at 2.0  CSF:  Tube # 3  Color, CSF COLORLESS COLORLESS  Appearance, CSF CLEAR HAZY Abnormal   Supernatant NOT INDICATED  RBC Count, CSF 0 /cu  mm 2 High   WBC, CSF 0 - 5 /cu mm 2   Specimen Description CSF  Special Requests NONE  Gram Stain WBC PRESENT, PREDOMINANTLY PMN NO ORGANISMS SEEN CYTOSPIN SMEAR Performed at [facility], [address] [place], [place] [address]  Culture PENDING  Report Status PENDING   CSF Protein 81 CSF Glucose 54 Meningitis/Encephalitis panel negative  Impression   77 y.o. female with PMH significant for DM, HTN, HLD, Chronic Diastolic CHF, s/p CABG x1 2015, s/p aortic root replacement, CAD, Thyroiditis who presented 12/8 with AMS and difficulty speaking. MRI negative for acute process. She was started on antibiotics due to concern for meningitis. LP was completed, negative for infection. Meningitis coverage was descaled. Patient with hypomagnesia on admission, this has improved. Evidence of seizure presentation on EEG. She was started and then dosage was increase of Keppra. Infection was considered as this will lower seizure threshold, but workup is negative. Stroke was still on differentials and first MRI was limited by motion, so a repeat MRI was completed. This showed no acute process, but did some come chronic infarcts.  LTM EEG continues to show sharp waves and evidence of epiletogenicity. CRP  is elevated and a link has been shown between elevated CRP and seizures, linking inflammation and epilepsy.    Recommendations   - Continue seizure precautions - Continue Keppra 750mg  twice daily  - Follow up thiamine level PENDING - Continue to evaluate for and treat infectious and metabolic derangements per primary team  - Discussed seizure precautions and no driving for 6 months with patient and husband at bedside.    - Neurology will continue to follow  _______________________________________________________________  Thank you for the opportunity to take part in the care of this patient. If you have any further questions, please contact the neurology consultation team on call. Updated  oncall schedule is listed on AMION.   SEIZURE PRECAUTIONS Per [facility] statutes, patients with seizures are not allowed to drive until they have been seizure-free for six months.   Use caution when using heavy equipment or power tools. Avoid working on ladders or at heights. Take showers instead of baths. Ensure the water temperature is not too high on the home water heater. Do not go swimming alone. Do not lock yourself in a room alone (i.e. bathroom). When caring for infants or small children, sit down when holding, feeding, or changing them to minimize risk of injury to the child in the event you have a seizure. Maintain good sleep hygiene. Avoid alcohol.    If patient has another seizure, call 911 and bring them back to the ED if: A.  The seizure lasts longer than 5 minutes.      B.  The patient doesn't wake shortly after the seizure or has new problems such as difficulty seeing, speaking or moving following the seizure C.  The patient was injured during the seizure D.  The patient has a temperature over 102 F (39C) E.  The patient vomited during the seizure and now is having trouble breathing   Pt seen by Neuro NP/APP and later by MD. Note/plan to be edited by MD as needed.    Lynnae January, DNP, AGACNP-BC [facility] Please use AMION for contact information & EPIC for messaging.   I have seen the patient reviewed the above note.  When I am discussing with her, she states that she has had recurrent episodes of twitching of her right lower extremity lasting a few minutes at a time that have been occurring about once a month for the past year.  She notes that she has difficulty with speaking during these episodes.  I would favor continuing EEG overnight again tonight given that she is still having difficulty with her speech, but as long as there are no further seizures we can discontinue this in the morning.  Ritta Slot, MD [facility] [phone]  If 7pm- 7am, please page neurology on call as listed in AMION.

## 2023-03-18 NOTE — Progress Notes (Signed)
Physical Therapy Treatment Patient Details Name: [name] [name] MRN: [id number] DOB: [birth year] Today's Date: 03/18/2023   History of Present Illness Pt is a 77 y.o. female who presented to ED with c/o slurred speech, confusion, and R facial droop. CT and MRI negative. Pt admitted for acute encephalopathy. PMH:  CAD, chronic diastolic CHF, DM2, HTN, hyperlipidemia AR s/p bioprosthetic valve repair, CABG in 2015    PT Comments  Pt assist getting back to bed from recliner at request of husband. Pt required more assistance getting back to bed compared this morning session. No change in DC/DME recs at this time. PT will continue to follow.     If plan is discharge home, recommend the following: Two people to help with walking and/or transfers;Two people to help with bathing/dressing/bathroom   Can travel by private vehicle     Yes  Equipment Recommendations  Other (comment) (Per accepting facility)    Recommendations for Other Services       Precautions / Restrictions Precautions Precautions: Fall;Other (comment) Precaution Comments: undergoing LTM EEG at time of eval Restrictions Weight Bearing Restrictions: No     Mobility  Bed Mobility Overal bed mobility: Needs Assistance Bed Mobility: Sit to Supine     Supine to sit: Contact guard Sit to supine: Contact guard assist   General bed mobility comments: Increased time. No physical assist provided.    Transfers Overall transfer level: Needs assistance Equipment used: 1 person hand held assist Transfers: Sit to/from Stand, Bed to chair/wheelchair/BSC Sit to Stand: Min assist   Step pivot transfers: Mod assist       General transfer comment: Pt required more assistance this time comapred to morning session.    Ambulation/Gait               General Gait Details: deferred due to cognition and undergoing EEG   Stairs             Wheelchair Mobility     Tilt Bed    Modified Rankin (Stroke Patients  Only)       Balance Overall balance assessment: Needs assistance Sitting-balance support: Single extremity supported, Feet supported Sitting balance-Leahy Scale: Fair     Standing balance support: Bilateral upper extremity supported, During functional activity Standing balance-Leahy Scale: Poor Standing balance comment: reliant on external support. Static stand x 10 seconds mod assist.                            Cognition Arousal: Alert Behavior During Therapy: Impulsive, Restless, Flat affect Overall Cognitive Status: Impaired/Different from baseline Area of Impairment: Orientation, Attention, Memory, Following commands, Safety/judgement, Awareness, Problem solving                 Orientation Level: Place, Time, Situation Current Attention Level: Focused Memory: Decreased recall of precautions, Decreased short-term memory Following Commands: Follows one step commands inconsistently Safety/Judgement: Decreased awareness of safety, Decreased awareness of deficits Awareness: Intellectual Problem Solving: Slow processing, Decreased initiation, Difficulty sequencing, Requires verbal cues, Requires tactile cues General Comments: Would utter a few words with increased time, would follow commands 50% of the time with increased time to process, pulling at lines and leads, restless. Very flat affect. Would stand multiple times while therapist was adjusting lines and leads.        Exercises      General Comments General comments (skin integrity, edema, etc.): VSS on RA      Pertinent Vitals/Pain Pain Assessment Pain  Assessment: No/denies pain    Home Living                          Prior Function            PT Goals (current goals can now be found in the care plan section) Progress towards PT goals: Progressing toward goals    Frequency    Min 1X/week      PT Plan      Co-evaluation              AM-PAC PT "6 Clicks" Mobility    Outcome Measure  Help needed turning from your back to your side while in a flat bed without using bedrails?: A Little Help needed moving from lying on your back to sitting on the side of a flat bed without using bedrails?: A Little Help needed moving to and from a bed to a chair (including a wheelchair)?: A Little Help needed standing up from a chair using your arms (e.g., wheelchair or bedside chair)?: A Little Help needed to walk in hospital room?: A Lot Help needed climbing 3-5 steps with a railing? : Total 6 Click Score: 15    End of Session Equipment Utilized During Treatment: Gait belt Activity Tolerance: Patient tolerated treatment well Patient left: in chair;with call bell/phone within reach;with chair alarm set;with family/visitor present Nurse Communication: Mobility status PT Visit Diagnosis: Other abnormalities of gait and mobility (R26.89)     Time: 1478-2956 PT Time Calculation (min) (ACUTE ONLY): 8 min  Charges:    $Therapeutic Activity: 8-22 mins PT General Charges $$ ACUTE PT VISIT: 1 Visit                     Shela Nevin, PT, DPT Acute Rehab Services [phone]    [name] 03/18/2023, 3:51 PM

## 2023-03-18 NOTE — Procedures (Signed)
Patient Name: [name] [name]  MRN: [id number]  Epilepsy Attending: Charlsie Quest  Referring Physician/Provider: Gordy Councilman, MD  Duration: 03/19/2023 0630 to 03/20/2023 0630   Patient history: 77yo F with aphasia getting eeg to evaluate for seizure   Level of alertness: Awake, asleep   AEDs during EEG study: LEV   Technical aspects: This EEG study was done with scalp electrodes positioned according to the 10-20 International system of electrode placement. Electrical activity was reviewed with band pass filter of 1-70Hz , sensitivity of 7 uV/mm, display speed of 5mm/sec with a 60Hz  notched filter applied as appropriate. EEG data were recorded continuously and digitally stored.  Video monitoring was available and reviewed as appropriate.   Description: The posterior dominant rhythm consists of 8 Hz activity of moderate voltage (25-35 uV) seen predominantly in posterior head regions, asymmetric ( left < right)and reactive to eye opening and eye closing. Sleep was characterized by vertex waves, sleep spindles (12 to 14 Hz), maximal frontocentral region. EEG showed continuous 2 to 3 Hz delta slowing in left hemisphere. Sharp waves were noted in left frontal region. Hyperventilation and photic stimulation were not performed.      ABNORMALITY - Sharp wave, left frontal region.  - Continuous slow, left hemisphere   IMPRESSION: This study showed evidence of epileptogenicity arising from left frontal region. Additionally there was cortical dysfunction arising from left hemisphere likely secondary to underlying structural abnormality, post-ictal state. No seizures were seen throughout the recording.   Tamico Mundo Annabelle Harman

## 2023-03-19 DIAGNOSIS — R569 Unspecified convulsions: Secondary | ICD-10-CM | POA: Diagnosis not present

## 2023-03-19 LAB — GLUCOSE, CAPILLARY
Glucose-Capillary: 119 mg/dL — ABNORMAL HIGH (ref 70–99)
Glucose-Capillary: 129 mg/dL — ABNORMAL HIGH (ref 70–99)
Glucose-Capillary: 130 mg/dL — ABNORMAL HIGH (ref 70–99)
Glucose-Capillary: 142 mg/dL — ABNORMAL HIGH (ref 70–99)
Glucose-Capillary: 159 mg/dL — ABNORMAL HIGH (ref 70–99)
Glucose-Capillary: 178 mg/dL — ABNORMAL HIGH (ref 70–99)
Glucose-Capillary: 203 mg/dL — ABNORMAL HIGH (ref 70–99)

## 2023-03-19 LAB — CBC WITH DIFFERENTIAL/PLATELET
Abs Immature Granulocytes: 0.04 10*3/uL (ref 0.00–0.07)
Basophils Absolute: 0.1 10*3/uL (ref 0.0–0.1)
Basophils Relative: 1 %
Eosinophils Absolute: 0.6 10*3/uL — ABNORMAL HIGH (ref 0.0–0.5)
Eosinophils Relative: 5 %
HCT: 36.9 % (ref 36.0–46.0)
Hemoglobin: 12.4 g/dL (ref 12.0–15.0)
Immature Granulocytes: 0 %
Lymphocytes Relative: 31 %
Lymphs Abs: 3.3 10*3/uL (ref 0.7–4.0)
MCH: 31.8 pg (ref 26.0–34.0)
MCHC: 33.6 g/dL (ref 30.0–36.0)
MCV: 94.6 fL (ref 80.0–100.0)
Monocytes Absolute: 1.2 10*3/uL — ABNORMAL HIGH (ref 0.1–1.0)
Monocytes Relative: 11 %
Neutro Abs: 5.6 10*3/uL (ref 1.7–7.7)
Neutrophils Relative %: 52 %
Platelets: 206 10*3/uL (ref 150–400)
RBC: 3.9 MIL/uL (ref 3.87–5.11)
RDW: 13.2 % (ref 11.5–15.5)
WBC: 10.7 10*3/uL — ABNORMAL HIGH (ref 4.0–10.5)
nRBC: 0 % (ref 0.0–0.2)

## 2023-03-19 LAB — BASIC METABOLIC PANEL
Anion gap: 9 (ref 5–15)
BUN: 16 mg/dL (ref 8–23)
CO2: 18 mmol/L — ABNORMAL LOW (ref 22–32)
Calcium: 9.2 mg/dL (ref 8.9–10.3)
Chloride: 107 mmol/L (ref 98–111)
Creatinine, Ser: 0.91 mg/dL (ref 0.44–1.00)
GFR, Estimated: >60 mL/min (ref >60)
Glucose, Bld: 131 mg/dL — ABNORMAL HIGH (ref 70–99)
Potassium: 4.1 mmol/L (ref 3.5–5.1)
Sodium: 134 mmol/L — ABNORMAL LOW (ref 135–145)

## 2023-03-19 LAB — C-REACTIVE PROTEIN: CRP: 5.9 mg/dL — ABNORMAL HIGH (ref <1.0)

## 2023-03-19 LAB — BRAIN NATRIURETIC PEPTIDE: B Natriuretic Peptide: 114.7 pg/mL — ABNORMAL HIGH (ref 0.0–100.0)

## 2023-03-19 LAB — MAGNESIUM: Magnesium: 2 mg/dL (ref 1.7–2.4)

## 2023-03-19 LAB — CSF CULTURE W GRAM STAIN

## 2023-03-19 LAB — PROCALCITONIN: Procalcitonin: <0.1 ng/mL

## 2023-03-19 MED ORDER — COLCHICINE 0.6 MG PO TABS
0.6000 mg | ORAL_TABLET | Freq: Every day | ORAL | Status: DC
  Administered 2023-03-19 – 2023-03-20 (×2): 0.6 mg via ORAL
  Filled 2023-03-19 (×2): qty 1

## 2023-03-19 MED ORDER — GLIMEPIRIDE 2 MG PO TABS
2.0000 mg | ORAL_TABLET | Freq: Every day | ORAL | Status: DC
  Administered 2023-03-19 – 2023-03-20 (×2): 2 mg via ORAL
  Filled 2023-03-19 (×2): qty 1

## 2023-03-19 MED ORDER — PANTOPRAZOLE SODIUM 40 MG PO TBEC
40.0000 mg | DELAYED_RELEASE_TABLET | Freq: Every day | ORAL | Status: DC
  Administered 2023-03-19 – 2023-03-20 (×2): 40 mg via ORAL
  Filled 2023-03-19 (×2): qty 1

## 2023-03-19 MED ORDER — THIAMINE MONONITRATE 100 MG PO TABS
100.0000 mg | ORAL_TABLET | Freq: Every day | ORAL | Status: DC
  Administered 2023-03-19 – 2023-03-20 (×2): 100 mg via ORAL
  Filled 2023-03-19 (×2): qty 1

## 2023-03-19 MED ORDER — SODIUM CHLORIDE 0.9 % IV SOLN
INTRAVENOUS | Status: DC
Start: 2023-03-19 — End: 2023-03-20

## 2023-03-19 NOTE — NC FL2 (Signed)
[place] MEDICAID FL2 LEVEL OF CARE FORM     IDENTIFICATION  Patient Name: [name] [name] Birthdate: 09-28-1945 Sex: female Admission Date (Current Location): 03/15/2023  [facility] and [place] Number:  Producer, television/film/video and Address:  [facility] [place], [address] [place], [place] [address]      Provider Number: [id number]  Attending Physician Name and Address:  Elgergawy, Leana Roe, MD  Relative Name and Phone Number:       Current Level of Care: Hospital Recommended Level of Care: Skilled Nursing Facility Prior Approval Number:    Date Approved/Denied:   PASRR Number: [id number]  Discharge Plan: SNF    Current Diagnoses: Patient Active Problem List   Diagnosis Date Noted   TIA (transient ischemic attack) 03/15/2023   Seizure (HCC) 03/15/2023   Controlled type 2 diabetes mellitus without complication, without long-term current use of insulin (HCC) 03/15/2023   Carotid stenosis 08/17/2017   Osteoarthritis of left knee 05/28/2017   Osteoarthritis of right knee 01/26/2017   Leg cramps 07/02/2015   Dyslipidemia, goal LDL below 70 10/21/2014   S/P Bio-Bentall aortic root replacement with bioprosthetic valve conduit 10/27/2013   S/P CABG x 1 10/27/2013   Aortic insufficiency 10/10/2013   Chronic diastolic CHF (congestive heart failure), NYHA class 2 (HCC)    Coronary artery disease 10/05/2013   PVC (premature ventricular contraction) 03/30/2013   Essential hypertension, benign 03/30/2013    Orientation RESPIRATION BLADDER Height & Weight     Self, Time, Situation, Place  Normal Incontinent, External catheter Weight: 161 lb 2.5 oz (73.1 kg) Height:  5' 0.5" (153.7 cm) (historical)  BEHAVIORAL SYMPTOMS/MOOD NEUROLOGICAL BOWEL NUTRITION STATUS      Continent Diet  AMBULATORY STATUS COMMUNICATION OF NEEDS Skin   Limited Assist Verbally Normal                       Personal Care Assistance Level of Assistance  Bathing, Feeding,  Dressing Bathing Assistance: Limited assistance Feeding assistance: Independent Dressing Assistance: Limited assistance     Functional Limitations Info             SPECIAL CARE FACTORS FREQUENCY  PT (By licensed PT), OT (By licensed OT), Speech therapy     PT Frequency: 5x/week OT Frequency: 5x/week     Speech Therapy Frequency: eval and treat      Contractures Contractures Info: Not present    Additional Factors Info  Code Status, Allergies, Insulin Sliding Scale Code Status Info: Full Allergies Info: Atenolol, Lisinopril, Lorazepam, Penicillins, Sulfa Antibiotics   Insulin Sliding Scale Info: See dc summary       Current Medications (03/19/2023):  This is the current hospital active medication list Current Facility-Administered Medications  Medication Dose Route Frequency Provider Last Rate Last Admin   0.9 %  sodium chloride infusion   Intravenous Continuous Elgergawy, Leana Roe, MD 50 mL/hr at 03/19/23 0600 New Bag at 03/19/23 0600   acetaminophen (TYLENOL) tablet 650 mg  650 mg Oral Q4H PRN Rai, Ripudeep K, MD   650 mg at 03/15/23 2115   Or   acetaminophen (TYLENOL) 160 MG/5ML solution 650 mg  650 mg Per Tube Q4H PRN Rai, Ripudeep K, MD       Or   acetaminophen (TYLENOL) suppository 650 mg  650 mg Rectal Q4H PRN Rai, Ripudeep K, MD       aspirin chewable tablet 81 mg  81 mg Oral Daily Rejeana Brock, MD  81 mg at 03/19/23 0820   Or   aspirin suppository 150 mg  150 mg Rectal Daily Rejeana Brock, MD       atorvastatin (LIPITOR) tablet 10 mg  10 mg Oral Daily Elgergawy, Leana Roe, MD   10 mg at 03/19/23 0820   carvedilol (COREG) tablet 3.125 mg  3.125 mg Oral BID WC Elgergawy, Leana Roe, MD   3.125 mg at 03/19/23 0820   colchicine tablet 0.6 mg  0.6 mg Oral Daily Elgergawy, Leana Roe, MD   0.6 mg at 03/19/23 0820   cyanocobalamin (VITAMIN B12) tablet 1,000 mcg  1,000 mcg Oral Daily Leroy Sea, MD   1,000 mcg at 03/19/23 0820   enoxaparin  (LOVENOX) injection 40 mg  40 mg Subcutaneous Q24H Rai, Ripudeep K, MD   40 mg at 03/18/23 2141   Gerhardt's butt cream   Topical BID Elgergawy, Leana Roe, MD   Given at 03/19/23 4132   glimepiride (AMARYL) tablet 2 mg  2 mg Oral Q breakfast Elgergawy, Leana Roe, MD       haloperidol lactate (HALDOL) injection 2 mg  2 mg Intramuscular Q6H PRN Leroy Sea, MD   2 mg at 03/16/23 1836   insulin aspart (novoLOG) injection 0-9 Units  0-9 Units Subcutaneous Q4H Rai, Ripudeep K, MD   2 Units at 03/19/23 0819   levETIRAcetam (KEPPRA) 750 mg in sodium chloride 0.9 % 100 mL IVPB  750 mg Intravenous BID Kara Mead, NP 430 mL/hr at 03/17/23 2204 750 mg at 03/17/23 2204   Or   levETIRAcetam (KEPPRA) tablet 750 mg  750 mg Oral BID Kara Mead, NP   750 mg at 03/19/23 4401   thiamine (VITAMIN B1) tablet 100 mg  100 mg Oral Daily Elgergawy, Leana Roe, MD   100 mg at 03/19/23 0820     Discharge Medications: Please see discharge summary for a list of discharge medications.  Relevant Imaging Results:  Relevant Lab Results:   Additional Information SSN: [id number] [place], [place]

## 2023-03-19 NOTE — Care Management Important Message (Signed)
Important Message  Patient Details  Name: [name] [name] MRN: [id number] Date of Birth: 1945-12-03   Important Message Given:  Yes - Medicare IM     Dorena Bodo 03/19/2023, 3:25 PM

## 2023-03-19 NOTE — TOC Progression Note (Addendum)
Transition of Care [facility]) - Progression Note    Patient Details  Name: [name] [name] MRN: [id number] Date of Birth: Jul 06, 1945  [facility] CM/SW Contact  Mearl Latin, LCSW Phone Number: 03/19/2023, 12:26 PM  Clinical Narrative:    12:26pm-CSW spoke with patient and spouse at discharge. Patient's spouse reported they are hopeful for patient to return home. He stated she has not been able to work with therapy much due to being hooked up to lines so they would like to see how she does with therapy prior to deciding disposition. CSW explained home health versus SNF given patient's current physical needs and fall risk. Patient expressed understanding of PT recommendation and is agreeable to CSW sending SNF referrals just in case. CSW discussed insurance authorization process and will provide Medicare SNF ratings list. CSW will send out referrals for review and provide bed offers as available.   4:23 PM-CSW met with patient and spouse and provided SNF bed offers and ratings list. Spouse is still hopeful that patient can improve and go home but request [facility] as their top choice. CSW sent request to [facility] for bed availability and received insurance approval, Ref# [id number], effective 03/20/2023-03/24/2023. Facility choice can be changed if [name] does not have a bed.     Skilled Nursing Rehab Facilities-   [email or web address]   Ratings out of 5 stars (5 the highest)   Name Address  Phone # Quality Care Staffing Health Inspection Overall  [facility] [address], [place] [phone] 2 2 5 5   [facility] [address], [place] [phone] 4 2 4 4   [facility] [address], [place] Otto [phone] 2 1 2 1   [facility] [address] [place] [phone] 3 1 4 3   [facility]  [address], [place] [phone] 4 4 5 5   [facility] [address] [facility] [phone] 3 2 2 2    [facility] [address], [place] [phone] 5 1 2 2   [facility] [address] [place] [phone] 1 1 3 1   [address] ([place]) [address] [place] [phone] 2 2 2 2   [facility] [address] [place], [place] [phone] 2 2 1 1   [facility]) [address], [place] [phone] 3 1 1 1   [facility]igh[facility] [address] Liliane Shi [phone] 3 3 4 4   [facility] [address], [place] [phone] 2 2 3 3           [facility] [address] [place] [phone] 4 2 1 1   [facility], [place] [address], [place] [phone] 1 1 2 1   [facility] [address] [place] [phone] 2 1 4 3   [facility] [address] [place]ree Ditto [phone] 2 1 4 3   [facility] [address], [place] [phone] 2 3 3 3           [address] (no [facility]) [address], [place] [phone] 4 5 5 5   [facility] (No [employer]) [address] [place] [phone] 1 2 4 3   [facility] [address] [place] [phone] 2 1 2 1   [facility] (No [employer]) [address], [place] [phone] 4 1 5 4   [facility] [address] [place] [phone] 3 4 2 2   [facility] [address], [place]ryne Sharper [phone] 2 1 1 1   [facility] [address] [place], [place] [phone] 4  2 5 5   [facility]  [address] [place] [phone] 2 2 3 3   [facility] [address], [place] [phone] 4 1 1 1   [facility] [address] [place], [place] [phone] 2 2 3 3           [facility] [address], [place] [phone] 2 1 1 1   [facility] [address], Evlyn Clines  [phone] 3 3 3 3   [facility] (No Humana) [address], [place] [phone] 2 2 4 4   [facility]) [address], [place]alita Levan [phone] 2 1 1 1   [facility] [address]  [place]osal[place] Levan [phone] 4 3 5 5   [facility] [address], [place] [phone] 1 1 1 1           [facility] [address] [place], [place] [phone] 5 4 5 5   [facility])  [address], [place] [phone] 1 1 2 1   [facility]) [address], [place] [phone]  2 4 4   [facility] [address], [place] [phone] 3 5 5 5   [address] [place], [place] [phone] 4 2 2 2   [facility]) [address] [place] [phone] 2 1 3 2       Expected Discharge Plan: Skilled Nursing Facility Barriers to Discharge: Insurance Authorization, Continued Medical Work up, SNF Pending bed offer  Expected Discharge Plan and Services In-house Referral: Clinical Social Work   Post Acute Care Choice: Home Health, Skilled Nursing Facility Living arrangements for the past 2 months: Single Family Home                                       Social Determinants of Health (SDOH) Interventions SDOH Screenings   Food Insecurity: Unknown (03/16/2023)  Housing: Patient Declined (03/16/2023)  Transportation Needs: Patient Declined (03/16/2023)  Utilities: Patient Declined (03/16/2023)  Tobacco Use: Low Risk  (03/15/2023)    Readmission Risk Interventions     No data to display

## 2023-03-19 NOTE — Progress Notes (Signed)
LTM EEG discontinued - no skin breakdown at [facility]. MB received D/C order while in the room to maintenance and continued with D/C.

## 2023-03-19 NOTE — Progress Notes (Signed)
MB performed maintenance on electrodes Fp1 and FP2. All impedances are below 10k ohms. No skin breakdown noted.

## 2023-03-19 NOTE — Progress Notes (Addendum)
Speech Language Pathology Treatment: Dysphagia;Cognitive-Linquistic  Patient Details Name: [name] [name] MRN: [id number] DOB: 17-Oct-1945 Today's Date: 03/19/2023 Time: 1152-1208 SLP Time Calculation (min) (ACUTE ONLY): 16 min  Assessment / Plan / Recommendation Clinical Impression  Pt seen for dysphagia intervention with upgraded texture/liquids and cognitive treatment was somewhat limited as pt was eating her lunch. She fed herself a salad, soda via straw with adequate mastication, transit and oral clearance. There were no signs of airway intrusion with straw sips thin throughout session. Pt and husband states no recent difficulties with swallowing and pt has met swallow goals and will sign off for dysphagia. Prior to admission pt was independent with all ADL's (including medications) and was driving. She states she has decreased memory which is worse with this hospitalization. Memory portion of Cognistat given and after 4 tries she was unable to immediately repeat 4 words however after 2 minutes she was able to recall all 4 words and at end of session (16 minutes) was able to recall 3 of 4 words independently and needed a semantic cue for 1 (decrease in retrieval). Pt also given the comprehension subtest and she scored in the average range. She had difficulty locating the knife on the left side of her tray and interestingly picked up the knife but stated she couldn't find it. When SLP asked what she was holding in her left hand she stated "a knife". ST to continue to see for cognitive treatment. Pt's disposition has not been established yet as PT has been limited in being able to mobilize pt with EEG in place. Husband states he hopes to take her home with home health services if able.    HPI HPI: 77 y.o. female with PMH significant of coronary artery disease, chronic diastolic CHF, diabetes mellitus 2, HTN, hyperlipidemia AR s/p bioprosthetic valve repair, GERD, CABG in 2015 presented to ED with  sudden confusion and aphasia this morning, slurred speech. MRI Motion degraded exam. EEG unremarkable. Within this limitation, negative for an acute infarct. Per chart found to have severe toxic and metabolic encephalopathy present on admission.      SLP Plan  Continue with current plan of care;Other (Comment) (swallow goals have been met)      Recommendations for follow up therapy are one component of a multi-disciplinary discharge planning process, led by the attending physician.  Recommendations may be updated based on patient status, additional functional criteria and insurance authorization.    Recommendations  Diet recommendations: Regular;Thin liquid Liquids provided via: Cup;Straw Medication Administration: Whole meds with liquid Supervision: Patient able to self feed Compensations: Slow rate;Small sips/bites Postural Changes and/or Swallow Maneuvers: Seated upright 90 degrees                  Oral care BID   Intermittent Supervision/Assistance Dysphagia, unspecified (R13.10);Cognitive communication deficit (R41.841)     Continue with current plan of care;Other (Comment) (swallow goals have been met)     Royce Macadamia  03/19/2023, 12:32 PM

## 2023-03-19 NOTE — Progress Notes (Signed)
NEUROLOGY CONSULT FOLLOW UP NOTE   Date of service: March 19, 2023 Patient Name: [name] [name] MRN:  [id number] DOB:  Oct 27, 1945  Brief HPI  CHAZ LESTER is a 77 y.o. female with PMH significant for DM, HTN, HLD, Chronic Diastolic CHF, s/p CABG x1 2015, s/p aortic root replacement, CAD, Thyroiditis who presented 12/8 with AMS and difficulty speaking. She was started on antibiotics due to concern for meningitis. LP was completed, negative for infection.  Patient described recurrent twitching of RLE that occurs about once a month for the past year, with accompanying difficulty speaking during the episodes.    Interval Hx/subjective   Patient more alert today. Speaking well, with intermittent aphasia, slow responses with increased talking.  LTM continues to show evidence of Jenise arising from the left frontal region, but no seizures. Will discontinue.  Vitals   Vitals:   03/19/23 0010 03/19/23 0400 03/19/23 0410 03/19/23 0751  BP: (!) 102/59 (!) 95/48 (!) 102/51 108/61  Pulse: 89 80 81 93  Resp: 18 11 17  (!) 22  Temp: 98.6 F (37 C)  98 F (36.7 C) (!) 97.4 F (36.3 C)  TempSrc: Oral  Oral Oral  SpO2: 97% 95% 96% 98%  Weight:      Height:        Body mass index is 30.96 kg/m.  Physical Exam   Constitutional:  Ill appearing, elderly, caucasian female Psych: Patient is calm and cooperative with exam, does try and scratch at her EEG wires requiring redirection  Eyes: No scleral injection.  HENT: No OP obstrucion.  Head: Normocephalic.  Cardiovascular: Normal rate and regular rhythm.  Respiratory: Effort normal, non-labored breathing on room air GI: Soft.  No distension. There is no tenderness.  Skin: WDI.   Neurologic Examination   Neuro: Mental Status: Patient is awake, alert, oriented to person, place, month, year, and situation. Patient is able to give a clear and coherent history. No signs of aphasia or neglect. Some dysarthria with continued questioning.  1/3 in short term memory testing. She is unable to do small calculations or spell the word world backwards.  Naming and repetition are intact for 1-2 times, then dysarthria increases and ability to name and repeat decrease.  Cranial Nerves: II: Visual Fields are full. Pupils are equal, round, and reactive to light.   III,IV, VI: EOMI without ptosis or diploplia.  V: Facial sensation is symmetric to temperature VII: Facial movement is symmetric.  VIII: hearing is intact to voice X: Uvula elevates symmetrically XI: Shoulder shrug is symmetric. XII: tongue is midline without atrophy or fasciculations.  Motor: Tone is normal. Bulk is normal. Generalized weakness, 4+/5 strength was present in all four extremities.  Sensory: Sensation is symmetric to light touch and temperature in the arms and legs. Cerebellar: FNF and HKS are slow, but intact bilaterally  Labs and Diagnostic Imaging   CBC:  Recent Labs  Lab 03/18/23 0550 03/19/23 0503  WBC 9.7 10.7*  NEUTROABS 6.1 5.6  HGB 12.3 12.4  HCT 35.5* 36.9  MCV 92.4 94.6  PLT 173 206   Basic Metabolic Panel:  Lab Results  Component Value Date   NA 134 (L) 03/19/2023   K 4.1 03/19/2023   CO2 18 (L) 03/19/2023   GLUCOSE 131 (H) 03/19/2023   BUN 16 03/19/2023   CREATININE 0.91 03/19/2023   CALCIUM 9.2 03/19/2023   GFRNONAA >60 03/19/2023   GFRAA >60 05/29/2017   Lipid Panel:  Lab Results  Component Value Date  LDLCALC 31 03/16/2023   HgbA1c:  Lab Results  Component Value Date   HGBA1C 7.1 (H) 03/16/2023   Urine Drug Screen:     Component Value Date/Time   LABOPIA NONE DETECTED 03/15/2023 1810   COCAINSCRNUR NONE DETECTED 03/15/2023 1810   LABBENZ NONE DETECTED 03/15/2023 1810   AMPHETMU NONE DETECTED 03/15/2023 1810   THCU NONE DETECTED 03/15/2023 1810   LABBARB NONE DETECTED 03/15/2023 1810    Alcohol Level     Component Value Date/Time   ETH <10 03/15/2023 1027   INR  Lab Results  Component Value Date   INR  1.1 03/15/2023   APTT  Lab Results  Component Value Date   APTT 31 03/15/2023   AED levels: No results found for: "PHENYTOIN", "ZONISAMIDE", "LAMOTRIGINE", "LEVETIRACETA"  CT Head without contrast(Personally reviewed):  No acute cortically based infarct or acute intracranial hemorrhage identified. ASPECTS 10.  CT angio Head and Neck with contrast(Personally reviewed): Negative for large vessel occlusion. ICA siphon calcified atherosclerosis with up to Moderate bilateral siphon stenosis.  MRI Brain(Personally reviewed): Motion degraded exam. Within this limitation, negative for an acute Infarct. Small bilateral mastoid effusions.   Repeat MRI Brain w/ and w/o: No acute abnormality. Mild chronic white matter disease Small remote chronic infarct in bilateral centrum semiovale, corona radiata. 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   cEEG: 12/8-12/9  This study showed evidence of epileptogenicity arising from left frontal region. Additionally there was cortical dysfunction arising from left hemisphere likely secondary to underlying structural abnormality, post-ictal state. No seizures or epileptiform discharges were seen throughout the recording.   cEEG 12/9 - 12/10: "This study showed evidence of epileptogenicity arising from left frontal region. Additionally there was cortical dysfunction arising from left hemisphere likely secondary to underlying structural abnormality, post-ictal state. No seizures were seen throughout the recording."  cEEG 12/10-12/11: This study showed evidence of epileptogenicity arising from left frontal region. Additionally there was cortical dysfunction arising from left hemisphere likely secondary to underlying structural abnormality, post-ictal state. No seizures were seen throughout the recording.   Labs UA negative.  UDS negative WBC 12.8 -> 8.9 TSH 1.086 Vit B12 117 Procal, CRP negative  Folate 38.6 Ammonia 19 Thiamine pending   CRP: elevated at  2.0  CSF:  Tube # 3  Color, CSF COLORLESS COLORLESS  Appearance, CSF CLEAR HAZY Abnormal   Supernatant NOT INDICATED  RBC Count, CSF 0 /cu mm 2 High   WBC, CSF 0 - 5 /cu mm 2   Specimen Description CSF  Special Requests NONE  Gram Stain WBC PRESENT, PREDOMINANTLY PMN NO ORGANISMS SEEN CYTOSPIN SMEAR Performed at [facility], [address], [place], [place] [address]  Culture PENDING  Report Status PENDING   CSF Protein 81 CSF Glucose 54 Meningitis/Encephalitis panel negative  Impression   77 y.o. female with PMH significant for DM, HTN, HLD, Chronic Diastolic CHF, s/p CABG x1 2015, s/p aortic root replacement, CAD, Thyroiditis who presented 12/8 with AMS and difficulty speaking. MRI negative for acute process. She was started on antibiotics due to concern for meningitis, but LP was completed and negative for infection.   Patient with hypomagnesia on admission, this has improved. Interictal sharp waves were present on EEG. She was started and then dosage was increase of Keppra.   LTM EEG continues to show sharp waves and evidence of epiletogenicity.   Recommendations   - Continue seizure precautions - Continue Keppra 750mg  twice daily - Discontinue cEEG  - Follow up thiamine level  PENDING - Continue to evaluate for and treat infectious and metabolic derangements per primary team  - Reviewed seizure precautions and no driving for 6 months with patient and husband at bedside.   - Neurology will continue to follow  _______________________________________________________________  Thank you for the opportunity to take part in the care of this patient. If you have any further questions, please contact the neurology consultation team on call. Updated oncall schedule is listed on AMION.   SEIZURE PRECAUTIONS Per [facility] statutes, patients with seizures are not allowed to drive until they have been seizure-free for six months.   Use caution when using  heavy equipment or power tools. Avoid working on ladders or at heights. Take showers instead of baths. Ensure the water temperature is not too high on the home water heater. Do not go swimming alone. Do not lock yourself in a room alone (i.e. bathroom). When caring for infants or small children, sit down when holding, feeding, or changing them to minimize risk of injury to the child in the event you have a seizure. Maintain good sleep hygiene. Avoid alcohol.    If patient has another seizure, call 911 and bring them back to the ED if: A.  The seizure lasts longer than 5 minutes.      B.  The patient doesn't wake shortly after the seizure or has new problems such as difficulty seeing, speaking or moving following the seizure C.  The patient was injured during the seizure D.  The patient has a temperature over 102 F (39C) E.  The patient vomited during the seizure and now is having trouble breathing   Pt seen by Neuro NP/APP and later by MD. Note/plan to be edited by MD as needed.    Lynnae January, DNP, AGACNP-BC [facility] Please use AMION for contact information & EPIC for messaging.  Attending attestation: I have seen the patient and reviewed the above note.  She presented with right-sided facial droop, severe dysarthria, and aphasia which waxed and waned repeatedly over the first few days of her hospitalization.  She had sharp waves on her EEG and I witnessed at least one event of a sudden change going from relatively fluent speech to right-sided facial weakness, aphasia.  After discussing with her prior episodes, she reports that she will about once a month to have twitching of her right lower extremity the last for 1 to 2 minutes associated with difficulty speaking.  This has been going on for about a year.  Her husband was not aware of these, but the patient is able to describe them very clearly.  Since increasing her Keppra to 750 twice daily, her speech has steadily improved  and there have been no further seizures on EEG.  PT/OT are recommending rehab, and I think that this is reasonable.  No further inpatient recommendations from neurology, I would continue her current Keppra, and the patient has been informed to not drive for 6 months from her most recent episode.  Please call with further questions or concerns.  Ritta Slot, MD [facility] [phone]  If 7pm- 7am, please page neurology on call as listed in AMION.

## 2023-03-19 NOTE — Progress Notes (Addendum)
PROGRESS NOTE                                                                                                                                                                                                             Patient Demographics:    [name] [name], is a 77 y.o. female, DOB - [birth year], RUE:[id number]  Outpatient Primary MD for the patient is Thana Ates, MD    LOS - 3  Admit date - 03/15/2023    Chief Complaint  Patient presents with   Aphasia       Brief Narrative (HPI from H&P)    77 y.o. female with PMH significant of coronary artery disease, chronic diastolic CHF, diabetes mellitus 2, HTN, hyperlipidemia AR s/p bioprosthetic valve repair, CABG in 2015 presented to ED with sudden confusion and aphasia this morning.  History was obtained from patient's husband and daughter at the bedside.  Per husband, she was in her normal state of health until last night until 10 PM.  This morning, around 8:15 am, he noted that she was confused, was trying to make coffee however had not put the water in the coffee machine.  He noted that her speech was slurred and then dropped as 4 out of 5 right hand.  She was also noted to have right facial droop, came to the ER and was seen by neurology and admitted to the hospital for further workup.   Subjective:    [name] with no significant events overnight, she denies any complaints    Assessment  & Plan :    Severe toxic and metabolic encephalopathy present on admission.  Seizures -Patient underwent stroke workup including MRI brain, CTA head and neck, P shows unremarkable CSF, overnight EEG raises question of some epileptogenicity. - Per patient's family, she has been having memory issues for last 6 to 9 months, ambulates with a cane,  -B1 pending.   -Use Haldol for as needed agitation avoid benzodiazepines.  - UA was unremarkable, stable ammonia, stable TSH, - low B12 -  replaced.  Neurology on board and following. -Concern for seizures, she remains on LTM EEG this morning, her Keppra has been adjusted, currently on 750 mg p.o. twice daily, this dose will be recommended on discharge as discussed with neuro, driving restriction and seizure precautions provided by neurology team to the  patient.    Essential hypertension, benign  -Hold antihypertensives, continue gentle hydration  B12 deficiency -started on supplements    Chronic diastolic CHF (congestive heart failure), NYHA class 2 (HCC) - 2D echo 04/28/2022 had shown EF 55 to 60%, G2 DD, mild aortic valve regurgitation, 23 mm valve present, No complaints of recent chest pain, shortness of breath or any cardiac symptoms   Dyslipidemia, goal LDL below 70 - Currently n.p.o., follow lipid panel   Hypokalemia and hypomagnesemia.  Replaced   Hyponatremia -Will start on IV ns  Fever -Septic workup is negative, continue to monitor off IV antibiotics, negative respiratory panel, blood cultures, UA and chest x-ray  GERD  -  PPI   Diabetes Mellitus type II, NIDDM uncontrolled with hyperglycemia - ISS, CBG remains elevated so we will start on home dose Amaryl  Lab Results  Component Value Date   HGBA1C 7.1 (H) 03/16/2023   CBG (last 3)  Recent Labs    03/19/23 0415 03/19/23 0753 03/19/23 1218  GLUCAP 130* 178* 142*        Condition - Extremely Guarded  Family Communication  : None at bedside Code Status :  Full  Consults  :  Neurology  PUD Prophylaxis :    Procedures  :     Overnight EEG 03/17/23 - This study showed evidence of epileptogenicity arising from left frontal region. Additionally there was cortical dysfunction arising from left hemisphere likely secondary to underlying structural abnormality, post-ictal state. No seizures were seen throughout the recording.   LP by IR 03/16/2023.  Stable CSF rules out any infection.    MRI - Motion degraded exam. Within this limitation, negative  for an acute infarct  EEG - Non acute  CT Head -   1. No acute cortically based infarct or acute intracranial hemorrhage identified. ASPECTS 10. 2. Moderately advanced cerebral white matter changes most commonly due to small vessel disease.   CTA Head -   1. Negative for large vessel occlusion. 2. ICA siphon calcified atherosclerosis with up to Moderate bilateral siphon stenosis. 3. Extracranial generalized arterial tortuosity but generally mild extracranial atherosclerosis. No other hemodynamically significant arterial stenosis. Aortic Atherosclerosis (ICD10-I70.0). 4. Evidence of Pulmonary Edema in the visible upper chest.       Disposition Plan  :    Status is: Observation  DVT Prophylaxis  :    enoxaparin (LOVENOX) injection 40 mg Start: 03/15/23 2115    Lab Results  Component Value Date   PLT 206 03/19/2023    Diet :  Diet Order             Diet regular Room service appropriate? Yes with Assist; Fluid consistency: Thin  Diet effective now                    Inpatient Medications  Scheduled Meds:  aspirin  81 mg Oral Daily   Or   aspirin  150 mg Rectal Daily   atorvastatin  10 mg Oral Daily   carvedilol  3.125 mg Oral BID WC   colchicine  0.6 mg Oral Daily   vitamin B-12  1,000 mcg Oral Daily   enoxaparin (LOVENOX) injection  40 mg Subcutaneous Q24H   Gerhardt's butt cream   Topical BID   glimepiride  2 mg Oral Q breakfast   insulin aspart  0-9 Units Subcutaneous Q4H   levETIRAcetam  750 mg Oral BID   thiamine  100 mg Oral Daily   Continuous Infusions:  sodium chloride  50 mL/hr at 03/19/23 0600   levETIRAcetam 750 mg (03/17/23 2204)   PRN Meds:.acetaminophen **OR** acetaminophen (TYLENOL) oral liquid 160 mg/5 mL **OR** acetaminophen, haloperidol lactate    Objective:   Vitals:   03/19/23 0400 03/19/23 0410 03/19/23 0751 03/19/23 1200  BP: (!) 95/48 (!) 102/51 108/61 (!) 114/59  Pulse: 80 81 93 91  Resp: 11 17 (!) 22 17  Temp:  98 F (36.7 C)  (!) 97.4 F (36.3 C) 98 F (36.7 C)  TempSrc:  Oral Oral Oral  SpO2: 95% 96% 98% 99%  Weight:      Height:        Wt Readings from Last 3 Encounters:  03/15/23 73.1 kg  04/17/22 74.8 kg  08/20/20 74.8 kg     Intake/Output Summary (Last 24 hours) at 03/19/2023 1342 Last data filed at 03/18/2023 1951 Gross per 24 hour  Intake --  Output 800 ml  Net -800 ml     Physical Exam  Awake Alert, Oriented X 3, No new F.N deficits, Normal affect Symmetrical Chest wall movement, Good air movement bilaterally, CTAB RRR,No Gallops,Rubs or new Murmurs, No Parasternal Heave +ve B.Sounds, Abd Soft, No tenderness, No rebound - guarding or rigidity. No Cyanosis, Clubbing or edema, No new Rash or bruise   Patient appears to be with rash in the upper back area, appears to be heat rash from laying on the bed.        Data Review:    Recent Labs  Lab 03/15/23 1027 03/15/23 1842 03/16/23 0459 03/17/23 0442 03/18/23 0550 03/19/23 0503  WBC 9.9  --  12.8* 8.9 9.7 10.7*  HGB 12.8 12.2 11.8* 12.5 12.3 12.4  HCT 37.5 36.0 34.9* 36.9 35.5* 36.9  PLT 199  --  178 166 173 206  MCV 95.2  --  93.6 93.9 92.4 94.6  MCH 32.5  --  31.6 31.8 32.0 31.8  MCHC 34.1  --  33.8 33.9 34.6 33.6  RDW 13.2  --  12.9 12.8 13.2 13.2  LYMPHSABS 2.7  --   --  2.8 2.2 3.3  MONOABS 0.6  --   --  0.8 0.9 1.2*  EOSABS 0.2  --   --  0.4 0.5 0.6*  BASOSABS 0.1  --   --  0.1 0.1 0.1    Recent Labs  Lab 03/15/23 1027 03/15/23 1840 03/15/23 1842 03/15/23 2105 03/16/23 0459 03/16/23 0555 03/17/23 0442 03/18/23 0550 03/19/23 0503  NA 135  --  134*  --   --   --  137 131* 134*  K 4.2  --  3.8  --   --   --  3.4* 3.8 4.1  CL 98  --  101  --   --   --  103 108 107  CO2 27  --   --   --   --   --  21* 18* 18*  ANIONGAP 10  --   --   --   --   --  13 5 9   GLUCOSE 180*  --  101*  --   --   --  95 134* 131*  BUN 13  --  8  --   --   --  7* 10 16  CREATININE 0.74  --  0.70 0.78  --   --  0.85 0.86 0.91  AST  21  --   --   --   --   --   --   --   --  ALT 13  --   --   --   --   --   --   --   --   ALKPHOS 46  --   --   --   --   --   --   --   --   BILITOT 0.8  --   --   --   --   --   --   --   --   ALBUMIN 4.5  --   --   --   --   --   --   --   --   CRP  --   --   --   --   --  <0.5 2.0* 3.6* 5.9*  PROCALCITON  --  <0.10  --   --   --  <0.10 <0.10 <0.10 <0.10  INR 1.1  --   --   --   --   --   --   --   --   TSH  --   --   --  1.086  --   --   --   --   --   HGBA1C  --   --   --   --  7.1*  --   --   --   --   AMMONIA  --   --   --   --   --  19  --   --   --   BNP  --   --   --   --   --  474.9* 167.1* 90.6 114.7*  MG  --   --   --   --   --   --  1.3* 2.3 2.0  CALCIUM 10.0  --   --   --   --   --  8.8* 8.5* 9.2      Recent Labs  Lab 03/15/23 1027 03/15/23 1840 03/15/23 2105 03/16/23 0459 03/16/23 0555 03/17/23 0442 03/18/23 0550 03/19/23 0503  CRP  --   --   --   --  <0.5 2.0* 3.6* 5.9*  PROCALCITON  --  <0.10  --   --  <0.10 <0.10 <0.10 <0.10  INR 1.1  --   --   --   --   --   --   --   TSH  --   --  1.086  --   --   --   --   --   HGBA1C  --   --   --  7.1*  --   --   --   --   AMMONIA  --   --   --   --  19  --   --   --   BNP  --   --   --   --  474.9* 167.1* 90.6 114.7*  MG  --   --   --   --   --  1.3* 2.3 2.0  CALCIUM 10.0  --   --   --   --  8.8* 8.5* 9.2    Lab Results  Component Value Date   CHOL 111 03/16/2023   HDL 59 03/16/2023   LDLCALC 31 03/16/2023   TRIG 107 03/16/2023   CHOLHDL 1.9 03/16/2023    Lab Results  Component Value Date   HGBA1C 7.1 (H) 03/16/2023   No results for input(s): "TSH", "T4TOTAL", "FREET4", "T3FREE", "THYROIDAB" in the last 72 hours.  No results for  input(s): "VITAMINB12", "FOLATE", "FERRITIN", "TIBC", "IRON", "RETICCTPCT" in the last 72 hours.     Micro Results Recent Results (from the past 240 hours)  Respiratory (~20 pathogens) panel by PCR     Status: None   Collection Time: 03/15/23  5:33 PM   Specimen:  Nasopharyngeal Swab; Respiratory  Result Value Ref Range Status   Adenovirus NOT DETECTED NOT DETECTED Final   Coronavirus 229E NOT DETECTED NOT DETECTED Final    Comment: (NOTE) The Coronavirus on the Respiratory Panel, DOES NOT test for the novel  Coronavirus (2019 nCoV)    Coronavirus HKU1 NOT DETECTED NOT DETECTED Final   Coronavirus NL63 NOT DETECTED NOT DETECTED Final   Coronavirus OC43 NOT DETECTED NOT DETECTED Final   Metapneumovirus NOT DETECTED NOT DETECTED Final   Rhinovirus / Enterovirus NOT DETECTED NOT DETECTED Final   Influenza A NOT DETECTED NOT DETECTED Final   Influenza B NOT DETECTED NOT DETECTED Final   Parainfluenza Virus 1 NOT DETECTED NOT DETECTED Final   Parainfluenza Virus 2 NOT DETECTED NOT DETECTED Final   Parainfluenza Virus 3 NOT DETECTED NOT DETECTED Final   Parainfluenza Virus 4 NOT DETECTED NOT DETECTED Final   Respiratory Syncytial Virus NOT DETECTED NOT DETECTED Final   Bordetella pertussis NOT DETECTED NOT DETECTED Final   Bordetella Parapertussis NOT DETECTED NOT DETECTED Final   Chlamydophila pneumoniae NOT DETECTED NOT DETECTED Final   Mycoplasma pneumoniae NOT DETECTED NOT DETECTED Final    Comment: Performed at [facility], [address], [place], [place] [address]  Resp panel by RT-PCR (RSV, Flu A&B, Covid) Nasopharyngeal Swab     Status: None   Collection Time: 03/15/23  5:33 PM   Specimen: Nasopharyngeal Swab; Nasal Swab  Result Value Ref Range Status   SARS Coronavirus 2 by RT PCR NEGATIVE NEGATIVE Final   Influenza A by PCR NEGATIVE NEGATIVE Final   Influenza B by PCR NEGATIVE NEGATIVE Final    Comment: (NOTE) The Xpert Xpress SARS-CoV-2/FLU/RSV plus assay is intended as an aid in the diagnosis of influenza from Nasopharyngeal swab specimens and should not be used as a sole basis for treatment. Nasal washings and aspirates are unacceptable for Xpert Xpress SARS-CoV-2/FLU/RSV testing.  Fact Sheet for  Patients: BloggerCourse.com  Fact Sheet for Healthcare Providers: SeriousBroker.it  This test is not yet approved or cleared by the [place] FDA and has been authorized for detection and/or diagnosis of SARS-CoV-2 by FDA under an Emergency Use Authorization (EUA). This EUA will remain in effect (meaning this test can be used) for the duration of the COVID-19 declaration under Section 564(b)(1) of the Act, 21 U.S.C. section 360bbb-3(b)(1), unless the authorization is terminated or revoked.     Resp Syncytial Virus by PCR NEGATIVE NEGATIVE Final    Comment: (NOTE) Fact Sheet for Patients: BloggerCourse.com  Fact Sheet for Healthcare Providers: SeriousBroker.it  This test is not yet approved or cleared by the [place] FDA and has been authorized for detection and/or diagnosis of SARS-CoV-2 by FDA under an Emergency Use Authorization (EUA). This EUA will remain in effect (meaning this test can be used) for the duration of the COVID-19 declaration under Section 564(b)(1) of the Act, 21 U.S.C. section 360bbb-3(b)(1), unless the authorization is terminated or revoked.  Performed at [facility], [address], [place], [place] [address]   Culture, blood (Routine X 2) w Reflex to ID Panel     Status: None (Preliminary result)   Collection Time: 03/15/23  6:40 PM  Specimen: BLOOD RIGHT HAND  Result Value Ref Range Status   Specimen Description BLOOD RIGHT HAND  Final   Special Requests   Final    BOTTLES DRAWN AEROBIC ONLY Blood Culture results may not be optimal due to an inadequate volume of blood received in culture bottles   Culture   Final    NO GROWTH 4 DAYS Performed at [facility], [address] [place], [place] [address]    Report Status PENDING  Incomplete  Culture, blood (Routine X 2) w Reflex to ID Panel     Status: None (Preliminary result)    Collection Time: 03/16/23  4:59 AM   Specimen: BLOOD RIGHT HAND  Result Value Ref Range Status   Specimen Description BLOOD RIGHT HAND  Final   Special Requests   Final    BOTTLES DRAWN AEROBIC AND ANAEROBIC Blood Culture adequate volume   Culture   Final    NO GROWTH 3 DAYS Performed at [facility], [address] [place], [place] [address]    Report Status PENDING  Incomplete  CSF culture w Gram Stain     Status: None   Collection Time: 03/16/23  4:42 PM   Specimen: PATH Cytology CSF; Cerebrospinal Fluid  Result Value Ref Range Status   Specimen Description CSF  Final   Special Requests NONE  Final   Gram Stain   Final    WBC PRESENT, PREDOMINANTLY PMN NO ORGANISMS SEEN CYTOSPIN SMEAR    Culture   Final    NO GROWTH 3 DAYS Performed at [facility], [address] [place], [place] [address]    Report Status 03/19/2023 FINAL  Final  MRSA Next Gen by PCR, Nasal     Status: None   Collection Time: 03/18/23 10:50 AM   Specimen: Urine, Clean Catch; Nasal Swab  Result Value Ref Range Status   MRSA by PCR Next Gen NOT DETECTED NOT DETECTED Final    Comment: (NOTE) The GeneXpert MRSA Assay (FDA approved for NASAL specimens only), is one component of a comprehensive MRSA colonization surveillance program. It is not intended to diagnose MRSA infection nor to guide or monitor treatment for MRSA infections. Test performance is not FDA approved in patients less than 47 years old. Performed at [facility], [address], [place], [place] [address]     Radiology Reports MR BRAIN W WO CONTRAST Result Date: 03/17/2023 CLINICAL DATA:  Neuro deficit, acute, stroke suspected. EXAM: MRI HEAD WITHOUT AND WITH CONTRAST TECHNIQUE: Multiplanar, multiecho pulse sequences of the brain and surrounding structures were obtained without and with intravenous contrast. CONTRAST:  7mL GADAVIST GADOBUTROL 1 MMOL/ML IV SOLN COMPARISON:  MRI of the brain March 15, 2023. FINDINGS:  Brain: No acute infarction, hydrocephalus, extra-axial collection or mass lesion. Small foci of susceptibility artifact in the bilateral occipital horns and left temporal horn of the lateral ventricles as well as in the left frontal lobe. Scattered and confluent foci of T2 hyperintensity are seen within white of the cerebral hemispheres, nonspecific. Small remote chronic infarcts in the bilateral centrum semiovale, corona radiata and chronic cortical infarct in the left frontal lobe. Focus of hemosiderin deposit in the left temporal lobe. No focus of abnormal contrast enhancement. Vascular: Normal flow voids. Skull and upper cervical spine: Normal marrow signal. Sinuses/Orbits: Negative. Other: Bilateral mastoid effusion. IMPRESSION: 1. No acute intracranial abnormality. 2. Mild chronic white matter disease. 3. Small remote chronic infarcts in the bilateral centrum semiovale, corona radiata  and chronic cortical infarct in the left frontal lobe. 4. Small foci of hemosiderin deposit in the bilateral occipital horns and left temporal horn of the lateral ventricles as well as in the left frontal lobe. 5. Bilateral mastoid effusion. Electronically Signed   By: Baldemar Lenis M.D.   On: 03/17/2023 16:34   DG FL GUIDED LUMBAR PUNCTURE Result Date: 03/16/2023 CLINICAL DATA:  Currently admitted for altered mental status. Team is requesting lumbar puncture for further evaluation EXAM: LUMBAR PUNCTURE UNDER FLUOROSCOPY PROCEDURE: An appropriate skin entry site was determined fluoroscopically. Operator donned sterile gloves and mask. Skin site was marked, then prepped with Betadine, draped in usual sterile fashion, and infiltrated locally with 1% lidocaine. A 20 gauge spinal needle advanced into the thecal sac at L5-S1 from a left interlaminar approach. Clear colorless CSF spontaneously returned, with opening pressure of 9 cm water. 13 ml CSF were collected and divided among 4 sterile vials for the requested  laboratory studies. The needle was then removed. The patient tolerated the procedure well and there were no complications. FLUOROSCOPY: Radiation Exposure Index (as provided by the fluoroscopic device): 3.5 mGy Kerma IMPRESSION: Technically successful lumbar puncture under fluoroscopy. This exam was performed by Anders Grant NP, and was supervised and interpreted by Dr. Ruthy Dick Electronically Signed   By: Gilmer Mor D.O.   On: 03/16/2023 16:58   ECHOCARDIOGRAM COMPLETE Result Date: 03/16/2023    ECHOCARDIOGRAM REPORT   Patient Name:   [name] [name] Date of Exam: 03/16/2023 Medical Rec #:  [id number]        Height:       60.5 in Accession #:    [id number]       Weight:       161.2 lb Date of Birth:  Jun 11, 1945         BSA:          1.713 m Patient Age:    80 years         BP:           134/62 mmHg Patient Gender: F                HR:           74 bpm. Exam Location:  Inpatient Procedure: 2D Echo, Cardiac Doppler and Color Doppler Indications:    TIA G45.9  History:        Patient has prior history of Echocardiogram examinations, most                 recent 04/28/2022. CHF, CAD, Prior CABG, TIA, Arrythmias:PVC;                 Risk Factors:Hypertension, Diabetes and Dyslipidemia.                 Aortic Valve: 23 mm Magna valve is present in the aortic                 position. Procedure Date: 10/27/2013.  Sonographer:    Lucendia Herrlich RCS Referring Phys: Delene Ruffini RAI  Sonographer Comments: Image acquisition challenging due to uncooperative patient. IMPRESSIONS  1. Left ventricular ejection fraction, by estimation, is 40 to 45%. The left ventricle has mildly decreased function. The left ventricle demonstrates global hypokinesis. The left ventricular internal cavity size was moderately dilated. Left ventricular diastolic parameters were normal.  2. Right ventricular systolic function is normal. The right ventricular size is normal.  3. Left atrial size was moderately dilated.  4. The mitral valve is  abnormal. Mild mitral valve regurgitation. No evidence of mitral stenosis.  5. Post AVR with 23 mm bioprosthetic Magna Ease valve . No PVL mean gradient 11 mmHg. The aortic valve has been repaired/replaced. Aortic valve regurgitation is not visualized. No aortic stenosis is present. There is a 23 mm Magna valve present in the aortic position. Procedure Date: 10/27/2013.  6. The inferior vena cava is normal in size with greater than 50% respiratory variability, suggesting right atrial pressure of 3 mmHg. FINDINGS  Left Ventricle: Left ventricular ejection fraction, by estimation, is 40 to 45%. The left ventricle has mildly decreased function. The left ventricle demonstrates global hypokinesis. The left ventricular internal cavity size was moderately dilated. There is no left ventricular hypertrophy. Left ventricular diastolic parameters were normal. Right Ventricle: The right ventricular size is normal. No increase in right ventricular wall thickness. Right ventricular systolic function is normal. Left Atrium: Left atrial size was moderately dilated. Right Atrium: Right atrial size was normal in size. Pericardium: There is no evidence of pericardial effusion. Mitral Valve: The mitral valve is abnormal. There is mild thickening of the mitral valve leaflet(s). There is mild calcification of the mitral valve leaflet(s). Mild mitral annular calcification. Mild mitral valve regurgitation. No evidence of mitral valve stenosis. Tricuspid Valve: The tricuspid valve is normal in structure. Tricuspid valve regurgitation is mild . No evidence of tricuspid stenosis. Aortic Valve: Post AVR with 23 mm bioprosthetic Magna Ease valve . No PVL mean gradient 11 mmHg. The aortic valve has been repaired/replaced. Aortic valve regurgitation is not visualized. No aortic stenosis is present. Aortic valve mean gradient measures  11.0 mmHg. Aortic valve peak gradient measures 21.0 mmHg. Aortic valve area, by VTI measures 1.39 cm. There is a  23 mm Magna valve present in the aortic position. Procedure Date: 10/27/2013. Pulmonic Valve: The pulmonic valve was normal in structure. Pulmonic valve regurgitation is not visualized. No evidence of pulmonic stenosis. Aorta: The aortic root is normal in size and structure. Venous: The inferior vena cava is normal in size with greater than 50% respiratory variability, suggesting right atrial pressure of 3 mmHg. IAS/Shunts: No atrial level shunt detected by color flow Doppler.  LEFT VENTRICLE PLAX 2D LVIDd:         4.95 cm      Diastology LVIDs:         4.10 cm      LV e' medial:    6.74 cm/s LV PW:         0.85 cm      LV E/e' medial:  14.1 LV IVS:        0.80 cm      LV e' lateral:   11.20 cm/s LVOT diam:     1.90 cm      LV E/e' lateral: 8.5 LV SV:         68 LV SV Index:   40 LVOT Area:     2.84 cm  LV Volumes (MOD) LV vol d, MOD A2C: 77.2 ml LV vol d, MOD A4C: 113.0 ml LV vol s, MOD A2C: 41.4 ml LV vol s, MOD A4C: 65.7 ml LV SV MOD A2C:     35.8 ml LV SV MOD A4C:     113.0 ml LV SV MOD BP:      39.2 ml RIGHT VENTRICLE            IVC RV S prime:     9.90 cm/s  IVC diam:  1.90 cm TAPSE (M-mode): 1.8 cm LEFT ATRIUM             Index        RIGHT ATRIUM           Index LA diam:        4.25 cm 2.48 cm/m   RA Area:     11.20 cm LA Vol (A2C):   46.0 ml 26.85 ml/m  RA Volume:   18.90 ml  11.03 ml/m LA Vol (A4C):   52.1 ml 30.41 ml/m LA Biplane Vol: 50.5 ml 29.47 ml/m  AORTIC VALVE AV Area (Vmax):    1.43 cm AV Area (Vmean):   1.47 cm AV Area (VTI):     1.39 cm AV Vmax:           229.00 cm/s AV Vmean:          154.000 cm/s AV VTI:            0.491 m AV Peak Grad:      21.0 mmHg AV Mean Grad:      11.0 mmHg LVOT Vmax:         115.67 cm/s LVOT Vmean:        80.100 cm/s LVOT VTI:          0.241 m LVOT/AV VTI ratio: 0.49  AORTA Ao Root diam: 3.50 cm Ao Asc diam:  2.80 cm MITRAL VALVE MV Area (PHT): 3.12 cm    SHUNTS MV Decel Time: 243 msec    Systemic VTI:  0.24 m MR Peak grad: 60.8 mmHg    Systemic Diam: 1.90 cm  MR Vmax:      390.00 cm/s MV E velocity: 94.90 cm/s MV A velocity: 94.90 cm/s MV E/A ratio:  1.00 Charlton Haws MD Electronically signed by Charlton Haws MD Signature Date/Time: 03/16/2023/11:17:54 AM    Final    DG Chest Port 1 View Result Date: 03/16/2023 CLINICAL DATA:  77 year old female with history of shortness of breath. EXAM: PORTABLE CHEST 1 VIEW COMPARISON:  Chest x-ray 03/15/2023. FINDINGS: Lung volumes are normal. No consolidative airspace disease. No definite pleural effusions. No pneumothorax. No evidence of pulmonary edema. Heart size is normal. Upper mediastinal contours are within normal limits. Atherosclerotic calcifications are noted in the thoracic aorta. Status post median sternotomy for aortic valve replacement (a stented bioprosthesis is noted). IMPRESSION: 1. No radiographic evidence of acute cardiopulmonary disease. 2. Aortic atherosclerosis. Electronically Signed   By: Trudie Reed M.D.   On: 03/16/2023 07:30   Overnight EEG with video Result Date: 03/16/2023 [name] Quest, MD     03/17/2023  9:28 AM Patient Name: LAYLEE LIPS MRN: [id number] Epilepsy Attending: Charlsie [name] Referring Physician/Provider: Gordy Councilman, MD Duration: 03/15/2023 1516 to 03/16/2023 2130 Patient history: 77yo F with aphasia getting eeg to evaluate for seizure Level of alertness: Awake, asleep AEDs during EEG study: LEV Technical aspects: This EEG study was done with scalp electrodes positioned according to the 10-20 International system of electrode placement. Electrical activity was reviewed with band pass filter of 1-70Hz , sensitivity of 7 uV/mm, display speed of 91mm/sec with a 60Hz  notched filter applied as appropriate. EEG data were recorded continuously and digitally stored.  Video monitoring was available and reviewed as appropriate. Description: The posterior dominant rhythm consists of 8 Hz activity of moderate voltage (25-35 uV) seen predominantly in posterior head regions,  asymmetric ( left < right)and reactive to eye opening and eye closing. Sleep was characterized by vertex waves, sleep  spindles (12 to 14 Hz), maximal frontocentral region. EEG showed continuous 2 to 3 Hz delta slowing in left hemisphere. Sharp waves were noted in left frontal region. Hyperventilation and photic stimulation were not performed.   EEG was disconnected between 03/15/2023 1923 to 03/16/2023 0114 due to room transfer ABNORMALITY -Sharp wave, left frontal region. - Continuous slow, left hemisphere IMPRESSION: This study showed evidence of epileptogenicity arising from left frontal region. Additionally there was cortical dysfunction arising from left hemisphere likely secondary to underlying structural abnormality, post-ictal state. No seizures were seen throughout the recording. [name] [name]   DG CHEST PORT 1 VIEW Result Date: 03/15/2023 CLINICAL DATA:  Seizure, right-sided facial droop EXAM: PORTABLE CHEST 1 VIEW COMPARISON:  05/02/2020 FINDINGS: Single frontal view of the chest demonstrates postsurgical changes from median sternotomy and aortic valve replacement. Stable cardiac silhouette. Pulmonary vascular congestion without acute airspace disease, effusion, or pneumothorax. No acute bony abnormalities. IMPRESSION: 1. Pulmonary vascular congestion without overt edema. Electronically Signed   By: Sharlet Salina M.D.   On: 03/15/2023 18:59   MR BRAIN WO CONTRAST Result Date: 03/15/2023 CLINICAL DATA:  Transient ischemic attack (TIA) EXAM: MRI HEAD WITHOUT CONTRAST TECHNIQUE: Multiplanar, multiecho pulse sequences of the brain and surrounding structures were obtained without intravenous contrast. COMPARISON:  Same day stroke code CT and CTA head/neck FINDINGS: Limitations: Motion degraded exam. Axial FLAIR sequences are essentially nondiagnostic. Brain: Negative for an acute infarct. No hemorrhage. No hydrocephalus. No extra-axial fluid collection. No mass effect. No mass lesion. Vascular: Normal  flow voids. Skull and upper cervical spine: Normal marrow signal. Sinuses/Orbits: Small bilateral mastoid effusions. No middle ear effusion. Bilateral lens replacement. Orbits are otherwise unremarkable. Other: None. IMPRESSION: Motion degraded exam. Within this limitation, negative for an acute infarct Electronically Signed   By: Lorenza Cambridge M.D.   On: 03/15/2023 14:36      Signature  -   Mliss Fritz Dail Lerew M.D on 03/19/2023 at 1:42 PM   -  To page go to [email or web address]

## 2023-03-19 NOTE — Progress Notes (Signed)
Occupational Therapy Treatment Patient Details Name: [name] [name] MRN: [id number] DOB: Dec 22, 1945 Today's Date: 03/19/2023   History of present illness Pt is a 77 y.o. female who presented to ED with c/o slurred speech, confusion, and R facial droop. CT and MRI negative. Pt admitted for acute encephalopathy. PMH:  CAD, chronic diastolic CHF, DM2, HTN, hyperlipidemia AR s/p bioprosthetic valve repair, CABG in 2015   OT comments  Patient received in seated in recliner completing lunch. Patient performed standing from recliner to RW with min assist and tolerated with BP 106/92 (96) and patient with no complaints of dizziness. Patient asked to use bathroom and was able to ambulate to bathroom with min assist and cues to avoid obstacles and min to manage walker. Patient performed gown and underwear change seated on toilet with min assist for gown and mod assist for underwear. Patient stood for toilet hygiene with mod assist to complete. Patient able to stand at sink for grooming tasks and returned to recliner. Patient and family asking for patient to return to bed and was min assist due to assistance with BLEs. Patient will benefit from continued inpatient follow up therapy, <3 hours/day but may progress to home if patent continues to make progress with in hospital setting. Acute OT to continue to follow to address bathing, dressing, and functional transfers.       If plan is discharge home, recommend the following:  A lot of help with bathing/dressing/bathroom;Assistance with cooking/housework;Direct supervision/assist for medications management;Direct supervision/assist for financial management;Assist for transportation;Help with stairs or ramp for entrance;Supervision due to cognitive status;A little help with walking and/or transfers   Equipment Recommendations  Other (comment) (defer to next venue of care)    Recommendations for Other Services      Precautions / Restrictions  Precautions Precautions: Fall Restrictions Weight Bearing Restrictions Per Provider Order: No       Mobility Bed Mobility Overal bed mobility: Needs Assistance Bed Mobility: Sit to Supine       Sit to supine: Min assist   General bed mobility comments: increased time and assistance with BLEs    Transfers Overall transfer level: Needs assistance Equipment used: Rolling walker (2 wheels) Transfers: Sit to/from Stand, Bed to chair/wheelchair/BSC Sit to Stand: Min assist     Step pivot transfers: Min assist     General transfer comment: cues for hand placement and safety. assistance with walker management     Balance Overall balance assessment: Needs assistance Sitting-balance support: Single extremity supported, Feet supported Sitting balance-Leahy Scale: Fair     Standing balance support: Single extremity supported, Bilateral upper extremity supported, During functional activity Standing balance-Leahy Scale: Poor Standing balance comment: able to stand with one extremity support while performing toilet hygiene and grooming tasks at sink                           ADL either performed or assessed with clinical judgement   ADL Overall ADL's : Needs assistance/impaired     Grooming: Wash/dry hands;Wash/dry face;Minimal assistance;Standing Grooming Details (indicate cue type and reason): at sink         Upper Body Dressing : Minimal assistance;Sitting Upper Body Dressing Details (indicate cue type and reason): to change gown Lower Body Dressing: Moderate assistance;Sit to/from stand Lower Body Dressing Details (indicate cue type and reason): assistance to thread legs into panties and patient assisted with pulling up clothing Toilet Transfer: Minimal assistance;Ambulation;Regular Toilet;Rolling walker (2 wheels) Toilet Transfer Details (indicate  cue type and reason): cues for safety and walker use and to reach back to sit Toileting- Clothing Manipulation  and Hygiene: Moderate assistance;Sit to/from stand Toileting - Clothing Manipulation Details (indicate cue type and reason): patient able to clean peri area front and required assistance to complete peri area back       General ADL Comments: patient able to follow directions with increase time and required frequent cues for safety with tasks    Extremity/Trunk Assessment              Vision       Perception     Praxis      Cognition Arousal: Alert Behavior During Therapy: Flat affect, Impulsive Overall Cognitive Status: Impaired/Different from baseline Area of Impairment: Orientation, Attention, Memory, Following commands, Safety/judgement, Awareness, Problem solving                 Orientation Level: Place, Time, Situation Current Attention Level: Focused Memory: Decreased recall of precautions, Decreased short-term memory Following Commands: Follows one step commands inconsistently, Follows one step commands with increased time Safety/Judgement: Decreased awareness of safety, Decreased awareness of deficits Awareness: Intellectual Problem Solving: Slow processing, Decreased initiation, Difficulty sequencing, Requires verbal cues, Requires tactile cues General Comments: more vocal on this date, requires frequent cues for safety and sequencing with tasks        Exercises      Shoulder Instructions       General Comments      Pertinent Vitals/ Pain       Pain Assessment Pain Assessment: No/denies pain Pain Intervention(s): Monitored during session  Home Living                                          Prior Functioning/Environment              Frequency  Min 1X/week        Progress Toward Goals  OT Goals(current goals can now be found in the care plan section)  Progress towards OT goals: Progressing toward goals  Acute Rehab OT Goals Patient Stated Goal: go home OT Goal Formulation: With patient/family Time For Goal  Achievement: 03/30/23 Potential to Achieve Goals: Fair ADL Goals Pt Will Perform Lower Body Bathing: with min assist;sitting/lateral leans;sit to/from stand Pt Will Perform Lower Body Dressing: sitting/lateral leans;sit to/from stand;with min assist Pt Will Transfer to Toilet: with min assist;regular height toilet;ambulating Pt Will Perform Toileting - Clothing Manipulation and hygiene: with min assist;sitting/lateral leans;sit to/from stand Additional ADL Goal #1: Patient will be able to follow 2 step commands consistently as a precursor to upper level cognitive tasks. Additional ADL Goal #2: Patient will be able to complete bed mobility at a supervision level as a precursor to OOB activities.  Plan      Co-evaluation                 AM-PAC OT "6 Clicks" Daily Activity     Outcome Measure   Help from another person eating meals?: A Little Help from another person taking care of personal grooming?: A Little Help from another person toileting, which includes using toliet, bedpan, or urinal?: A Lot Help from another person bathing (including washing, rinsing, drying)?: A Lot Help from another person to put on and taking off regular upper body clothing?: A Little Help from another person to put on and taking off regular lower  body clothing?: A Lot 6 Click Score: 15    End of Session Equipment Utilized During Treatment: Gait belt;Rolling walker (2 wheels)  OT Visit Diagnosis: Unsteadiness on feet (R26.81);Other abnormalities of gait and mobility (R26.89);Other symptoms and signs involving cognitive function;Pain   Activity Tolerance Patient tolerated treatment well   Patient Left in bed;with call bell/phone within reach;with bed alarm set;with family/visitor present   Nurse Communication Mobility status        Time: 9629-5284 OT Time Calculation (min): 38 min  Charges: OT General Charges $OT Visit: 1 Visit OT Treatments $Self Care/Home Management : 38-52 mins  Alfonse Flavors, OTA Acute Rehabilitation Services  Office [phone]   Dewain Penning 03/19/2023, 2:07 PM

## 2023-03-19 NOTE — Procedures (Addendum)
Patient Name: [name] [name]  MRN: [id number]  Epilepsy Attending: Charlsie Quest  Referring Physician/Provider: Gordy Councilman, MD  Duration: 03/20/2023 0630 to 03/20/2023 1030   Patient history: 77yo F with aphasia getting eeg to evaluate for seizure   Level of alertness: Awake, asleep   AEDs during EEG study: LEV   Technical aspects: This EEG study was done with scalp electrodes positioned according to the 10-20 International system of electrode placement. Electrical activity was reviewed with band pass filter of 1-70Hz , sensitivity of 7 uV/mm, display speed of 47mm/sec with a 60Hz  notched filter applied as appropriate. EEG data were recorded continuously and digitally stored.  Video monitoring was available and reviewed as appropriate.   Description: The posterior dominant rhythm consists of 8 Hz activity of moderate voltage (25-35 uV) seen predominantly in posterior head regions, asymmetric ( left < right)and reactive to eye opening and eye closing. Sleep was characterized by vertex waves, sleep spindles (12 to 14 Hz), maximal frontocentral region. EEG showed continuous 2 to 3 Hz delta slowing in left hemisphere. Sharp waves were noted in left frontal region. Hyperventilation and photic stimulation were not performed.      ABNORMALITY - Sharp wave, left frontal region.  - Continuous slow, left hemisphere   IMPRESSION: This study showed evidence of epileptogenicity arising from left frontal region. Additionally there was cortical dysfunction arising from left hemisphere likely secondary to underlying structural abnormality, post-ictal state. No seizures were seen throughout the recording.   [name] [name]

## 2023-03-19 NOTE — Plan of Care (Signed)
  Problem: Education: Goal: Knowledge of General Education information will improve Description: Including pain rating scale, medication(s)/side effects and non-pharmacologic comfort measures Outcome: Progressing   Problem: Health Behavior/Discharge Planning: Goal: Ability to manage health-related needs will improve Outcome: Progressing   Problem: Clinical Measurements: Goal: Ability to maintain clinical measurements within normal limits will improve Outcome: Progressing Goal: Will remain free from infection Outcome: Progressing Goal: Diagnostic test results will improve Outcome: Progressing Goal: Respiratory complications will improve Outcome: Progressing Goal: Cardiovascular complication will be avoided Outcome: Progressing   Problem: Activity: Goal: Risk for activity intolerance will decrease Outcome: Progressing   Problem: Nutrition: Goal: Adequate nutrition will be maintained Outcome: Progressing   Problem: Coping: Goal: Level of anxiety will decrease Outcome: Progressing   Problem: Elimination: Goal: Will not experience complications related to bowel motility Outcome: Progressing Goal: Will not experience complications related to urinary retention Outcome: Progressing   Problem: Pain Management: Goal: General experience of comfort will improve Outcome: Progressing   Problem: Safety: Goal: Ability to remain free from injury will improve Outcome: Progressing   Problem: Skin Integrity: Goal: Risk for impaired skin integrity will decrease Outcome: Progressing   Problem: Education: Goal: Ability to describe self-care measures that may prevent or decrease complications (Diabetes Survival Skills Education) will improve Outcome: Progressing Goal: Individualized Educational Video(s) Outcome: Progressing   Problem: Coping: Goal: Ability to adjust to condition or change in health will improve Outcome: Progressing   Problem: Fluid Volume: Goal: Ability to  maintain a balanced intake and output will improve Outcome: Progressing   Problem: Health Behavior/Discharge Planning: Goal: Ability to identify and utilize available resources and services will improve Outcome: Progressing Goal: Ability to manage health-related needs will improve Outcome: Progressing   Problem: Metabolic: Goal: Ability to maintain appropriate glucose levels will improve Outcome: Progressing   Problem: Nutritional: Goal: Maintenance of adequate nutrition will improve Outcome: Progressing Goal: Progress toward achieving an optimal weight will improve Outcome: Progressing   Problem: Skin Integrity: Goal: Risk for impaired skin integrity will decrease Outcome: Progressing   Problem: Tissue Perfusion: Goal: Adequacy of tissue perfusion will improve Outcome: Progressing   Problem: Education: Goal: Knowledge of disease or condition will improve Outcome: Progressing Goal: Knowledge of secondary prevention will improve (MUST DOCUMENT ALL) Outcome: Progressing Goal: Knowledge of patient specific risk factors will improve Loraine Leriche N/A or DELETE if not current risk factor) Outcome: Progressing   Problem: Ischemic Stroke/TIA Tissue Perfusion: Goal: Complications of ischemic stroke/TIA will be minimized Outcome: Progressing   Problem: Coping: Goal: Will verbalize positive feelings about self Outcome: Progressing Goal: Will identify appropriate support needs Outcome: Progressing   Problem: Health Behavior/Discharge Planning: Goal: Ability to manage health-related needs will improve Outcome: Progressing Goal: Goals will be collaboratively established with patient/family Outcome: Progressing   Problem: Self-Care: Goal: Ability to participate in self-care as condition permits will improve Outcome: Progressing Goal: Verbalization of feelings and concerns over difficulty with self-care will improve Outcome: Progressing Goal: Ability to communicate needs accurately  will improve Outcome: Progressing   Problem: Nutrition: Goal: Risk of aspiration will decrease Outcome: Progressing Goal: Dietary intake will improve Outcome: Progressing

## 2023-03-20 DIAGNOSIS — M6281 Muscle weakness (generalized): Secondary | ICD-10-CM | POA: Diagnosis not present

## 2023-03-20 DIAGNOSIS — M6259 Muscle wasting and atrophy, not elsewhere classified, multiple sites: Secondary | ICD-10-CM | POA: Diagnosis not present

## 2023-03-20 DIAGNOSIS — R1312 Dysphagia, oropharyngeal phase: Secondary | ICD-10-CM | POA: Diagnosis not present

## 2023-03-20 DIAGNOSIS — R41841 Cognitive communication deficit: Secondary | ICD-10-CM | POA: Diagnosis not present

## 2023-03-20 DIAGNOSIS — I5032 Chronic diastolic (congestive) heart failure: Secondary | ICD-10-CM | POA: Diagnosis not present

## 2023-03-20 DIAGNOSIS — G40909 Epilepsy, unspecified, not intractable, without status epilepticus: Secondary | ICD-10-CM | POA: Diagnosis not present

## 2023-03-20 DIAGNOSIS — G459 Transient cerebral ischemic attack, unspecified: Secondary | ICD-10-CM | POA: Diagnosis not present

## 2023-03-20 DIAGNOSIS — I2510 Atherosclerotic heart disease of native coronary artery without angina pectoris: Secondary | ICD-10-CM | POA: Diagnosis not present

## 2023-03-20 DIAGNOSIS — I110 Hypertensive heart disease with heart failure: Secondary | ICD-10-CM | POA: Diagnosis not present

## 2023-03-20 DIAGNOSIS — M48061 Spinal stenosis, lumbar region without neurogenic claudication: Secondary | ICD-10-CM | POA: Diagnosis not present

## 2023-03-20 DIAGNOSIS — M4800 Spinal stenosis, site unspecified: Secondary | ICD-10-CM | POA: Diagnosis not present

## 2023-03-20 DIAGNOSIS — E119 Type 2 diabetes mellitus without complications: Secondary | ICD-10-CM | POA: Diagnosis not present

## 2023-03-20 DIAGNOSIS — Z741 Need for assistance with personal care: Secondary | ICD-10-CM | POA: Diagnosis not present

## 2023-03-20 DIAGNOSIS — R569 Unspecified convulsions: Secondary | ICD-10-CM | POA: Diagnosis not present

## 2023-03-20 DIAGNOSIS — I429 Cardiomyopathy, unspecified: Secondary | ICD-10-CM | POA: Diagnosis not present

## 2023-03-20 DIAGNOSIS — K219 Gastro-esophageal reflux disease without esophagitis: Secondary | ICD-10-CM | POA: Diagnosis not present

## 2023-03-20 DIAGNOSIS — M1A9XX0 Chronic gout, unspecified, without tophus (tophi): Secondary | ICD-10-CM | POA: Diagnosis not present

## 2023-03-20 LAB — GLUCOSE, CAPILLARY
Glucose-Capillary: 123 mg/dL — ABNORMAL HIGH (ref 70–99)
Glucose-Capillary: 99 mg/dL (ref 70–99)
Glucose-Capillary: 201 mg/dL — ABNORMAL HIGH (ref 70–99)

## 2023-03-20 LAB — CULTURE, BLOOD (ROUTINE X 2): Culture: NO GROWTH

## 2023-03-20 MED ORDER — VITAMIN B-1 100 MG PO TABS: 100.0000 mg | ORAL_TABLET | Freq: Every day | ORAL | Status: AC

## 2023-03-20 MED ORDER — CYANOCOBALAMIN 1000 MCG PO TABS: 1000.0000 ug | ORAL_TABLET | Freq: Every day | ORAL | Status: AC

## 2023-03-20 MED ORDER — LACOSAMIDE 100 MG PO TABS: 100.0000 mg | ORAL_TABLET | Freq: Two times a day (BID) | ORAL | Status: DC

## 2023-03-20 MED ORDER — LACOSAMIDE 50 MG PO TABS
100.0000 mg | ORAL_TABLET | Freq: Two times a day (BID) | ORAL | Status: DC
  Administered 2023-03-20: 100 mg via ORAL
  Filled 2023-03-20: qty 2

## 2023-03-20 MED ORDER — LACOSAMIDE 100 MG PO TABS: 100.0000 mg | ORAL_TABLET | Freq: Two times a day (BID) | ORAL | 0 refills | Status: DC

## 2023-03-20 NOTE — Discharge Instructions (Signed)
Follow with Primary MD /SNF physician  Get CBC, CMP, hecked  by Primary MD next visit.    Activity: As tolerated with Full fall precautions use walker/cane & assistance as needed   Disposition SNF   Diet: Heart Healthy .   On your next visit with your primary care physician please Get Medicines reviewed and adjusted.   Please request your Prim.MD to go over all Hospital Tests and Procedure/Radiological results at the follow up, please get all Hospital records sent to your Prim MD by signing hospital release before you go home.   If you experience worsening of your admission symptoms, develop shortness of breath, life threatening emergency, suicidal or homicidal thoughts you must seek medical attention immediately by calling 911 or calling your MD immediately  if symptoms less severe.  You Must read complete instructions/literature along with all the possible adverse reactions/side effects for all the Medicines you take and that have been prescribed to you. Take any new Medicines after you have completely understood and accpet all the possible adverse reactions/side effects.   Do not drive, operating heavy machinery, perform activities at heights, swimming or participation in water activities or provide baby sitting services if your were admitted for syncope or siezures until you have seen by Primary MD or a Neurologist and advised to do so again.  Do not drive when taking Pain medications.    Do not take more than prescribed Pain, Sleep and Anxiety Medications  Special Instructions: If you have smoked or chewed Tobacco  in the last 2 yrs please stop smoking, stop any regular Alcohol  and or any Recreational drug use.  Wear Seat belts while driving.   Please note  You were cared for by a hospitalist during your hospital stay. If you have any questions about your discharge medications or the care you received while you were in the hospital after you are discharged, you can call  the unit and asked to speak with the hospitalist on call if the hospitalist that took care of you is not available. Once you are discharged, your primary care physician will handle any further medical issues. Please note that NO REFILLS for any discharge medications will be authorized once you are discharged, as it is imperative that you return to your primary care physician (or establish a relationship with a primary care physician if you do not have one) for your aftercare needs so that they can reassess your need for medications and monitor your lab values.

## 2023-03-20 NOTE — Discharge Summary (Signed)
Physician Discharge Summary  MILAGRO WEISBECKER KUV:[id number] DOB: [birth year] DOA: 03/15/2023  PCP: Thana Ates, MD  Admit date: 03/15/2023 Discharge date: 03/20/2023  Admitted From: (Home) Disposition:  (SNF)  Recommendations for Outpatient Follow-up:  Please obtain BMP/CBC in 3 days Blood pressure closely, and if it starts to increase resume home losartan. Please follow on final results of B12 level, meanwhile continue with supplements  Diet recommendation: Heart Healthy / Carb Modified / Regular     Discharge Diagnoses:  Principal Problem:   Seizure (HCC) Active Problems:   Essential hypertension, benign   Coronary artery disease   Chronic diastolic CHF (congestive heart failure), NYHA class 2 (HCC)   Dyslipidemia, goal LDL below 70   TIA (transient ischemic attack)   Controlled type 2 diabetes mellitus without complication, without long-term current use of insulin (HCC)  77 y.o. female with PMH significant of coronary artery disease, chronic diastolic CHF, diabetes mellitus 2, HTN, hyperlipidemia AR s/p bioprosthetic valve repair, CABG in 2015, s/p aortic root replacement, CAD, Thyroiditis  presented to ED with sudden confusion and aphasia this morning. She was also noted to have right facial droop, came to the ER and was seen by neurology and admitted to the hospital for further workup.  She was started on antibiotics due to concern for meningitis. LP was completed, negative for infection. History and findings were concerning more for seizures giving similar events in the past, neurology were consulted, please see discussion below.     Severe toxic and metabolic encephalopathy present on admission.  Seizures -Patient underwent stroke workup including MRI brain, CTA head and neck, P shows unremarkable CSF, overnight EEG raises question of some epileptogenicity. - Per patient's family, she has been having memory issues for last 6 to 9 months, ambulates with a cane,   - UA was  unremarkable, stable ammonia, stable TSH, - low B12 - replaced.  Continue with supplements on discharge -Was empirically on B1 supplements, follow on levels, still pending at time of discharge -Concern for seizures, was on LTM EEG,  Interictal sharp waves were present on EEG. She was started and then dosage was I, LTM EEG continues to show sharp waves and evidence of epiletogenicity.  Keppra dose was adjusted and increased, no further waves on Keppra 750 mg p.o. twice daily, patient did develop rash in the back and the chest area, concern for Keppra causing the rash, but to be on the cautious side, her Keppra has been changed to Vimpat 100 mg p.o. twice daily . -Neurology team reviewed seizure precautions and no driving for 6 months with the patient and her husband at bedside   Fever -Presents with fever on admission, her septic workup is negative, including LP, respiratory panel, UA and chest x-ray, blood cultures negative, she was empirically on vancomycin and cefepime for the first 48 hours, this has been discontinued she remained stable over last 3 days.    Rash -Patient with maculopapular rash in the upper back area, no clear etiology, started after she was on antibiotics and Keppra, it is most likely related to antibiotics, but given concern Keppra as new meds as well, so this has been switched to Vimpat as discussed with Dr. Amada Jupiter to be on the safe side    Essential hypertension, benign  -Blood pressure initially soft, but this has improved, so she was resumed back on her home Coreg, remained stable only on Coreg, so losartan has been discontinued on discharge   B12 deficiency -started on supplements, please  monitor level low sleep.    Chronic diastolic CHF (congestive heart failure), NYHA class 2 (HCC) - 2D echo 04/28/2022 had shown EF 55 to 60%, G2 DD, mild aortic valve regurgitation, 23 mm valve present, No complaints of recent chest pain, shortness of breath or any cardiac  symptoms   Dyslipidemia, goal LDL below 70    Hypokalemia and hypomagnesemia.  Replaced    GERD  -  PPI    Diabetes Mellitus type II, NIDDM uncontrolled with hyperglycemia - ISS,  - resume home medicine on discharge  Discharge Instructions  Discharge Instructions     Diet - low sodium heart healthy   Complete by: As directed    Discharge instructions   Complete by: As directed    Follow with Primary MD /SNF physician  Get CBC, CMP, hecked  by Primary MD next visit.    Activity: As tolerated with Full fall precautions use walker/cane & assistance as needed   Disposition SNF   Diet: Heart Healthy .   On your next visit with your primary care physician please Get Medicines reviewed and adjusted.   Please request your Prim.MD to go over all Hospital Tests and Procedure/Radiological results at the follow up, please get all Hospital records sent to your Prim MD by signing hospital release before you go home.   If you experience worsening of your admission symptoms, develop shortness of breath, life threatening emergency, suicidal or homicidal thoughts you must seek medical attention immediately by calling 911 or calling your MD immediately  if symptoms less severe.  You Must read complete instructions/literature along with all the possible adverse reactions/side effects for all the Medicines you take and that have been prescribed to you. Take any new Medicines after you have completely understood and accpet all the possible adverse reactions/side effects.   Do not drive, operating heavy machinery, perform activities at heights, swimming or participation in water activities or provide baby sitting services if your were admitted for syncope or siezures until you have seen by Primary MD or a Neurologist and advised to do so again.  Do not drive when taking Pain medications.    Do not take more than prescribed Pain, Sleep and Anxiety Medications  Special Instructions: If you  have smoked or chewed Tobacco  in the last 2 yrs please stop smoking, stop any regular Alcohol  and or any Recreational drug use.  Wear Seat belts while driving.   Please note  You were cared for by a hospitalist during your hospital stay. If you have any questions about your discharge medications or the care you received while you were in the hospital after you are discharged, you can call the unit and asked to speak with the hospitalist on call if the hospitalist that took care of you is not available. Once you are discharged, your primary care physician will handle any further medical issues. Please note that NO REFILLS for any discharge medications will be authorized once you are discharged, as it is imperative that you return to your primary care physician (or establish a relationship with a primary care physician if you do not have one) for your aftercare needs so that they can reassess your need for medications and monitor your lab values.   Increase activity slowly   Complete by: As directed       Allergies as of 03/20/2023       Reactions   Atenolol Shortness Of Breath, Other (See Comments)   Edema and Headache  Lisinopril Cough   Lorazepam Other (See Comments)   Over sedation. Family does not want lorazepam.    Penicillins Rash, Other (See Comments)   Has patient had a PCN reaction causing immediate rash, facial/tongue/throat swelling, SOB or lightheadedness with hypotension: No Has patient had a PCN reaction causing severe rash involving mucus membranes or skin necrosis: No Has patient had a PCN reaction that required hospitalization No Has patient had a PCN reaction occurring within the last 10 years: No If all of the above answers are "NO", then may proceed with Cephalosporin use.G Benzathine: Local injection rash   Sulfa Antibiotics Hives, Rash        Medication List     STOP taking these medications    losartan 25 MG tablet Commonly known as: COZAAR   meloxicam  7.5 MG tablet Commonly known as: MOBIC   naproxen sodium 220 MG tablet Commonly known as: ALEVE       TAKE these medications    aspirin 81 MG chewable tablet Chew 1 tablet (81 mg total) by mouth 2 (two) times daily.   atorvastatin 10 MG tablet Commonly known as: LIPITOR TAKE 1 TABLET BY MOUTH EVERY DAY   CALCIUM 600-D PO Take 1 tablet by mouth daily.   carvedilol 6.25 MG tablet Commonly known as: COREG TAKE 1 TABLET BY MOUTH 2 TIMES DAILY WITH A MEAL.   clindamycin 150 MG capsule Commonly known as: CLEOCIN Take 4 capsules by mouth as needed. (Take one (1) hour prior to dental procedures)   colchicine 0.6 MG tablet Take 0.6 mg by mouth daily.   cyanocobalamin 1000 MCG tablet Take 1 tablet (1,000 mcg total) by mouth daily. Start taking on: March 21, 2023   glimepiride 2 MG tablet Commonly known as: AMARYL Take 2 mg by mouth daily with breakfast.   GLUCOSAMINE-CHONDROITIN PO Take 1 tablet by mouth daily. Glucosamine 1200 mg, chondroitin 1500 mg   Lacosamide 100 MG Tabs Take 1 tablet (100 mg total) by mouth 2 (two) times daily.   metFORMIN 500 MG 24 hr tablet Commonly known as: GLUCOPHAGE-XR Take 1,000 mg by mouth in the morning and at bedtime.   multivitamin with minerals Tabs tablet Take 1 tablet by mouth daily. Centrum Silver   omeprazole 40 MG capsule Commonly known as: PRILOSEC Take 40 mg by mouth daily.   ONE TOUCH ULTRA TEST test strip Generic drug: glucose blood   pramipexole 1.5 MG tablet Commonly known as: MIRAPEX Take 1.5 mg by mouth at bedtime.   ranitidine 150 MG tablet Commonly known as: ZANTAC Take 150 mg by mouth daily as needed for heartburn.   thiamine 100 MG tablet Commonly known as: Vitamin B-1 Take 1 tablet (100 mg total) by mouth daily. Start taking on: March 21, 2023        Contact information for after-discharge care     Destination     [facility] Preferred SNF .   Service: Skilled  Nursing Contact information: [address] [place] [address] [phone]                    Allergies  Allergen Reactions   Atenolol Shortness Of Breath and Other (See Comments)    Edema and Headache   Lisinopril Cough   Lorazepam Other (See Comments)    Over sedation. Family does not want lorazepam.    Penicillins Rash and Other (See Comments)    Has patient had a PCN reaction causing immediate rash,  facial/tongue/throat swelling, SOB or lightheadedness with hypotension: No Has patient had a PCN reaction causing severe rash involving mucus membranes or skin necrosis: No Has patient had a PCN reaction that required hospitalization No Has patient had a PCN reaction occurring within the last 10 years: No If all of the above answers are "NO", then may proceed with Cephalosporin use.G Benzathine: Local injection rash   Sulfa Antibiotics Hives and Rash    Consultations: Neurology   Procedures/Studies: MR BRAIN W WO CONTRAST Result Date: 03/17/2023 CLINICAL DATA:  Neuro deficit, acute, stroke suspected. EXAM: MRI HEAD WITHOUT AND WITH CONTRAST TECHNIQUE: Multiplanar, multiecho pulse sequences of the brain and surrounding structures were obtained without and with intravenous contrast. CONTRAST:  7mL GADAVIST GADOBUTROL 1 MMOL/ML IV SOLN COMPARISON:  MRI of the brain March 15, 2023. FINDINGS: Brain: No acute infarction, hydrocephalus, extra-axial collection or mass lesion. Small foci of susceptibility artifact in the bilateral occipital horns and left temporal horn of the lateral ventricles as well as in the left frontal lobe. Scattered and confluent foci of T2 hyperintensity are seen within white of the cerebral hemispheres, nonspecific. Small remote chronic infarcts in the bilateral centrum semiovale, corona radiata and chronic cortical infarct in the left frontal lobe. Focus of hemosiderin deposit in the left temporal lobe. No focus of abnormal contrast  enhancement. Vascular: Normal flow voids. Skull and upper cervical spine: Normal marrow signal. Sinuses/Orbits: Negative. Other: Bilateral mastoid effusion. IMPRESSION: 1. No acute intracranial abnormality. 2. Mild chronic white matter disease. 3. Small remote chronic infarcts in the bilateral centrum semiovale, corona radiata and chronic cortical infarct in the left frontal lobe. 4. Small foci of hemosiderin deposit in the bilateral occipital horns and left temporal horn of the lateral ventricles as well as in the left frontal lobe. 5. Bilateral mastoid effusion. Electronically Signed   By: Baldemar Lenis M.D.   On: 03/17/2023 16:34   DG FL GUIDED LUMBAR PUNCTURE Result Date: 03/16/2023 CLINICAL DATA:  Currently admitted for altered mental status. Team is requesting lumbar puncture for further evaluation EXAM: LUMBAR PUNCTURE UNDER FLUOROSCOPY PROCEDURE: An appropriate skin entry site was determined fluoroscopically. Operator donned sterile gloves and mask. Skin site was marked, then prepped with Betadine, draped in usual sterile fashion, and infiltrated locally with 1% lidocaine. A 20 gauge spinal needle advanced into the thecal sac at L5-S1 from a left interlaminar approach. Clear colorless CSF spontaneously returned, with opening pressure of 9 cm water. 13 ml CSF were collected and divided among 4 sterile vials for the requested laboratory studies. The needle was then removed. The patient tolerated the procedure well and there were no complications. FLUOROSCOPY: Radiation Exposure Index (as provided by the fluoroscopic device): 3.5 mGy Kerma IMPRESSION: Technically successful lumbar puncture under fluoroscopy. This exam was performed by Anders Grant NP, and was supervised and interpreted by Dr. Ruthy Dick Electronically Signed   By: Gilmer Mor D.O.   On: 03/16/2023 16:58   ECHOCARDIOGRAM COMPLETE Result Date: 03/16/2023    ECHOCARDIOGRAM REPORT   Patient Name:   VEDHA CATA  Date of Exam: 03/16/2023 Medical Rec #:  [id number]        Height:       60.5 in Accession #:    [id number]       Weight:       161.2 lb Date of Birth:  06-14-1945         BSA:          1.713 m Patient Age:  77 years         BP:           134/62 mmHg Patient Gender: F                HR:           74 bpm. Exam Location:  Inpatient Procedure: 2D Echo, Cardiac Doppler and Color Doppler Indications:    TIA G45.9  History:        Patient has prior history of Echocardiogram examinations, most                 recent 04/28/2022. CHF, CAD, Prior CABG, TIA, Arrythmias:PVC;                 Risk Factors:Hypertension, Diabetes and Dyslipidemia.                 Aortic Valve: 23 mm Magna valve is present in the aortic                 position. Procedure Date: 10/27/2013.  Sonographer:    Lucendia Herrlich RCS Referring Phys: Delene Ruffini RAI  Sonographer Comments: Image acquisition challenging due to uncooperative patient. IMPRESSIONS  1. Left ventricular ejection fraction, by estimation, is 40 to 45%. The left ventricle has mildly decreased function. The left ventricle demonstrates global hypokinesis. The left ventricular internal cavity size was moderately dilated. Left ventricular diastolic parameters were normal.  2. Right ventricular systolic function is normal. The right ventricular size is normal.  3. Left atrial size was moderately dilated.  4. The mitral valve is abnormal. Mild mitral valve regurgitation. No evidence of mitral stenosis.  5. Post AVR with 23 mm bioprosthetic Magna Ease valve . No PVL mean gradient 11 mmHg. The aortic valve has been repaired/replaced. Aortic valve regurgitation is not visualized. No aortic stenosis is present. There is a 23 mm Magna valve present in the aortic position. Procedure Date: 10/27/2013.  6. The inferior vena cava is normal in size with greater than 50% respiratory variability, suggesting right atrial pressure of 3 mmHg. FINDINGS  Left Ventricle: Left ventricular ejection fraction, by  estimation, is 40 to 45%. The left ventricle has mildly decreased function. The left ventricle demonstrates global hypokinesis. The left ventricular internal cavity size was moderately dilated. There is no left ventricular hypertrophy. Left ventricular diastolic parameters were normal. Right Ventricle: The right ventricular size is normal. No increase in right ventricular wall thickness. Right ventricular systolic function is normal. Left Atrium: Left atrial size was moderately dilated. Right Atrium: Right atrial size was normal in size. Pericardium: There is no evidence of pericardial effusion. Mitral Valve: The mitral valve is abnormal. There is mild thickening of the mitral valve leaflet(s). There is mild calcification of the mitral valve leaflet(s). Mild mitral annular calcification. Mild mitral valve regurgitation. No evidence of mitral valve stenosis. Tricuspid Valve: The tricuspid valve is normal in structure. Tricuspid valve regurgitation is mild . No evidence of tricuspid stenosis. Aortic Valve: Post AVR with 23 mm bioprosthetic Magna Ease valve . No PVL mean gradient 11 mmHg. The aortic valve has been repaired/replaced. Aortic valve regurgitation is not visualized. No aortic stenosis is present. Aortic valve mean gradient measures  11.0 mmHg. Aortic valve peak gradient measures 21.0 mmHg. Aortic valve area, by VTI measures 1.39 cm. There is a 23 mm Magna valve present in the aortic position. Procedure Date: 10/27/2013. Pulmonic Valve: The pulmonic valve was normal in structure. Pulmonic valve regurgitation is not visualized. No evidence  of pulmonic stenosis. Aorta: The aortic root is normal in size and structure. Venous: The inferior vena cava is normal in size with greater than 50% respiratory variability, suggesting right atrial pressure of 3 mmHg. IAS/Shunts: No atrial level shunt detected by color flow Doppler.  LEFT VENTRICLE PLAX 2D LVIDd:         4.95 cm      Diastology LVIDs:         4.10 cm       LV e' medial:    6.74 cm/s LV PW:         0.85 cm      LV E/e' medial:  14.1 LV IVS:        0.80 cm      LV e' lateral:   11.20 cm/s LVOT diam:     1.90 cm      LV E/e' lateral: 8.5 LV SV:         68 LV SV Index:   40 LVOT Area:     2.84 cm  LV Volumes (MOD) LV vol d, MOD A2C: 77.2 ml LV vol d, MOD A4C: 113.0 ml LV vol s, MOD A2C: 41.4 ml LV vol s, MOD A4C: 65.7 ml LV SV MOD A2C:     35.8 ml LV SV MOD A4C:     113.0 ml LV SV MOD BP:      39.2 ml RIGHT VENTRICLE            IVC RV S prime:     9.90 cm/s  IVC diam: 1.90 cm TAPSE (M-mode): 1.8 cm LEFT ATRIUM             Index        RIGHT ATRIUM           Index LA diam:        4.25 cm 2.48 cm/m   RA Area:     11.20 cm LA Vol (A2C):   46.0 ml 26.85 ml/m  RA Volume:   18.90 ml  11.03 ml/m LA Vol (A4C):   52.1 ml 30.41 ml/m LA Biplane Vol: 50.5 ml 29.47 ml/m  AORTIC VALVE AV Area (Vmax):    1.43 cm AV Area (Vmean):   1.47 cm AV Area (VTI):     1.39 cm AV Vmax:           229.00 cm/s AV Vmean:          154.000 cm/s AV VTI:            0.491 m AV Peak Grad:      21.0 mmHg AV Mean Grad:      11.0 mmHg LVOT Vmax:         115.67 cm/s LVOT Vmean:        80.100 cm/s LVOT VTI:          0.241 m LVOT/AV VTI ratio: 0.49  AORTA Ao Root diam: 3.50 cm Ao Asc diam:  2.80 cm MITRAL VALVE MV Area (PHT): 3.12 cm    SHUNTS MV Decel Time: 243 msec    Systemic VTI:  0.24 m MR Peak grad: 60.8 mmHg    Systemic Diam: 1.90 cm MR Vmax:      390.00 cm/s MV E velocity: 94.90 cm/s MV A velocity: 94.90 cm/s MV E/A ratio:  1.00 Charlton Haws MD Electronically signed by Charlton Haws MD Signature Date/Time: 03/16/2023/11:17:54 AM    Final    DG Chest Port 1 View Result Date: 03/16/2023 CLINICAL DATA:  77 year old female with history of shortness of breath. EXAM: PORTABLE CHEST 1 VIEW COMPARISON:  Chest x-ray 03/15/2023. FINDINGS: Lung volumes are normal. No consolidative airspace disease. No definite pleural effusions. No pneumothorax. No evidence of pulmonary edema. Heart size is normal. Upper  mediastinal contours are within normal limits. Atherosclerotic calcifications are noted in the thoracic aorta. Status post median sternotomy for aortic valve replacement (a stented bioprosthesis is noted). IMPRESSION: 1. No radiographic evidence of acute cardiopulmonary disease. 2. Aortic atherosclerosis. Electronically Signed   By: Trudie Reed M.D.   On: 03/16/2023 07:30   Overnight EEG with video Result Date: 03/16/2023 Charlsie Quest, MD     03/17/2023  9:28 AM Patient Name: [name] [name] MRN: [id number] Epilepsy Attending: Charlsie Quest Referring Physician/Provider: Gordy Councilman, MD Duration: 03/15/2023 1516 to 03/16/2023 2130 Patient history: 77yo F with aphasia getting eeg to evaluate for seizure Level of alertness: Awake, asleep AEDs during EEG study: LEV Technical aspects: This EEG study was done with scalp electrodes positioned according to the 10-20 International system of electrode placement. Electrical activity was reviewed with band pass filter of 1-70Hz , sensitivity of 7 uV/mm, display speed of 78mm/sec with a 60Hz  notched filter applied as appropriate. EEG data were recorded continuously and digitally stored.  Video monitoring was available and reviewed as appropriate. Description: The posterior dominant rhythm consists of 8 Hz activity of moderate voltage (25-35 uV) seen predominantly in posterior head regions, asymmetric ( left < right)and reactive to eye opening and eye closing. Sleep was characterized by vertex waves, sleep spindles (12 to 14 Hz), maximal frontocentral region. EEG showed continuous 2 to 3 Hz delta slowing in left hemisphere. Sharp waves were noted in left frontal region. Hyperventilation and photic stimulation were not performed.   EEG was disconnected between 03/15/2023 1923 to 03/16/2023 0114 due to room transfer ABNORMALITY -Sharp wave, left frontal region. - Continuous slow, left hemisphere IMPRESSION: This study showed evidence of epileptogenicity arising  from left frontal region. Additionally there was cortical dysfunction arising from left hemisphere likely secondary to underlying structural abnormality, post-ictal state. No seizures were seen throughout the recording. Priyanka Annabelle Harman   DG CHEST PORT 1 VIEW Result Date: 03/15/2023 CLINICAL DATA:  Seizure, right-sided facial droop EXAM: PORTABLE CHEST 1 VIEW COMPARISON:  05/02/2020 FINDINGS: Single frontal view of the chest demonstrates postsurgical changes from median sternotomy and aortic valve replacement. Stable cardiac silhouette. Pulmonary vascular congestion without acute airspace disease, effusion, or pneumothorax. No acute bony abnormalities. IMPRESSION: 1. Pulmonary vascular congestion without overt edema. Electronically Signed   By: Sharlet Salina M.D.   On: 03/15/2023 18:59   MR BRAIN WO CONTRAST Result Date: 03/15/2023 CLINICAL DATA:  Transient ischemic attack (TIA) EXAM: MRI HEAD WITHOUT CONTRAST TECHNIQUE: Multiplanar, multiecho pulse sequences of the brain and surrounding structures were obtained without intravenous contrast. COMPARISON:  Same day stroke code CT and CTA head/neck FINDINGS: Limitations: Motion degraded exam. Axial FLAIR sequences are essentially nondiagnostic. Brain: Negative for an acute infarct. No hemorrhage. No hydrocephalus. No extra-axial fluid collection. No mass effect. No mass lesion. Vascular: Normal flow voids. Skull and upper cervical spine: Normal marrow signal. Sinuses/Orbits: Small bilateral mastoid effusions. No middle ear effusion. Bilateral lens replacement. Orbits are otherwise unremarkable. Other: None. IMPRESSION: Motion degraded exam. Within this limitation, negative for an acute infarct Electronically Signed   By: Lorenza Cambridge M.D.   On: 03/15/2023 14:36   CT ANGIO HEAD NECK W WO CM (CODE STROKE) Result Date: 03/15/2023 CLINICAL DATA:  77 year old female code stroke presentation. EXAM: CT ANGIOGRAPHY HEAD AND NECK WITH AND WITHOUT CONTRAST TECHNIQUE:  Multidetector CT imaging of the head and neck was performed using the standard protocol during bolus administration of intravenous contrast. Multiplanar CT image reconstructions and MIPs were obtained to evaluate the vascular anatomy. Carotid stenosis measurements (when applicable) are obtained utilizing NASCET criteria, using the distal internal carotid diameter as the denominator. RADIATION DOSE REDUCTION: This exam was performed according to the departmental dose-optimization program which includes automated exposure control, adjustment of the mA and/or kV according to patient size and/or use of iterative reconstruction technique. CONTRAST:  75mL OMNIPAQUE IOHEXOL 350 MG/ML SOLN COMPARISON:  Head CT 1035 hours today. FINDINGS: CTA NECK Skeleton: Sternotomy. Cervical spine degeneration, mostly right side facet arthropathy. No acute osseous abnormality identified. Upper chest: Septal thickening, ground-glass opacity, dependent lung opacity. No apical pleural effusions. Negative visible mediastinum. Other neck: Negative. Aortic arch: Calcified aortic atherosclerosis.  3 vessel arch. Right carotid system: Brachiocephalic artery and proximal right CCA are tortuous with mild plaque, no stenosis. Mild calcified plaque at the right ICA origin and bulb without stenosis. Highly tortuous right ICA distal to the bulb. Left carotid system: Similar tortuosity. Low-density soft plaque at the ventral left CCA at the level of the larynx without stenosis on series 4, image 111. Otherwise mild proximal ICA plaque without stenosis. Tortuous left ICA distal to the bulb. Vertebral arteries: Tortuous proximal right subclavian artery with soft and calcified plaque but no significant stenosis. Right vertebral artery origin remains normal. Tortuous right V1 segment and mildly late entry of the right vertebral into the transverse foramen. Right vertebral is patent to the skull base without stenosis. Tortuous and atherosclerotic proximal  left subclavian artery with a kinked appearance at the thoracic inlet but no atherosclerotic stenosis. Left vertebral artery origin is tortuous without stenosis. Left vertebral artery appears mildly dominant and is patent to the skull base with no significant plaque or stenosis. CTA HEAD Posterior circulation: Distal vertebral arteries and vertebrobasilar junction are patent without stenosis. Normal PICA origins. Patent basilar artery without stenosis. Patent SCA and PCA origins. Posterior communicating arteries are diminutive or absent. Bilateral PCA branches are within normal limits, there is mild right PCA P2 segment irregularity. Anterior circulation: Both ICA siphons are patent. Left siphon is heavily calcified with mild to moderate stenosis at the anterior genu. Right siphon is heavily calcified in the supraclinoid segment with mild-to-moderate stenosis. Patent carotid termini, MCA and ACA origins. Dominant left and diminutive right ACA A1 segments. Normal anterior communicating artery. Bilateral ACA branches are within normal limits. Left MCA M1 segment is patent without stenosis. Left MCA trifurcation is mildly ectatic, patent without stenosis. Left MCA branches are within normal limits. Right MCA M1 segment and bifurcation are patent without stenosis. Right MCA branches are within normal limits. Venous sinuses: Not evaluated due to early contrast timing. Anatomic variants: Dominant left ACA A1. Mildly dominant left vertebral artery. Review of the MIP images confirms the above findings IMPRESSION: 1. Negative for large vessel occlusion. 2. ICA siphon calcified atherosclerosis with up to Moderate bilateral siphon stenosis. 3. Extracranial generalized arterial tortuosity but generally mild extracranial atherosclerosis. No other hemodynamically significant arterial stenosis. Aortic Atherosclerosis (ICD10-I70.0). 4. Evidence of Pulmonary Edema in the visible upper chest. Electronically Signed   By: Odessa Fleming M.D.    On: 03/15/2023 11:54   CT HEAD CODE STROKE WO CONTRAST Addendum Date: 03/15/2023 ADDENDUM REPORT: 03/15/2023 11:17 ADDENDUM: Study discussed by telephone with PA BROOKE SMALL  on 03/15/2023 at 1106 hours. Electronically Signed   By: Odessa Fleming M.D.   On: 03/15/2023 11:17   Result Date: 03/15/2023 CLINICAL DATA:  Code stroke. 77 year old female last known well 2200 hours. Slurred speech, aphasia. EXAM: CT HEAD WITHOUT CONTRAST TECHNIQUE: Contiguous axial images were obtained from the base of the skull through the vertex without intravenous contrast. RADIATION DOSE REDUCTION: This exam was performed according to the departmental dose-optimization program which includes automated exposure control, adjustment of the mA and/or kV according to patient size and/or use of iterative reconstruction technique. COMPARISON:  None Available. FINDINGS: Brain: No midline shift, mass effect, or evidence of intracranial mass lesion. No ventriculomegaly. No acute intracranial hemorrhage identified. Cerebral volume is within normal limits for age. Patchy and confluent bilateral white matter hypodensity with some deep white matter involvement in both hemispheres. Deep gray nuclei relatively spared. No cortically based acute infarct identified. No cortical encephalomalacia identified. Vascular: Calcified atherosclerosis at the skull base. No suspicious intracranial vascular hyperdensity. Skull: No acute osseous abnormality identified. Sinuses/Orbits: Visualized paranasal sinuses and mastoids are clear. Other: No gaze deviation. Postoperative changes to the globes. Visualized scalp soft tissues are within normal limits. ASPECTS [facility] Stroke Program Early CT Score) Total score (0-10 with 10 being normal): 10 IMPRESSION: 1. No acute cortically based infarct or acute intracranial hemorrhage identified. ASPECTS 10. 2. Moderately advanced cerebral white matter changes most commonly due to small vessel disease. Electronically Signed: By: Odessa Fleming M.D. On: 03/15/2023 11:04      Subjective:  No significant events overnight, she denies any complaints today Discharge Exam: Vitals:   03/20/23 0451 03/20/23 0800  BP: 121/62 133/82  Pulse: 73 73  Resp: 18 18  Temp: 97.9 F (36.6 C) 98 F (36.7 C)  SpO2: 99% 98%   Vitals:   03/19/23 2103 03/20/23 0057 03/20/23 0451 03/20/23 0800  BP: (!) 123/58 (!) 138/126 121/62 133/82  Pulse: 82 87 73 73  Resp: (!) 21 19 18 18   Temp: 98 F (36.7 C) 97.7 F (36.5 C) 97.9 F (36.6 C) 98 F (36.7 C)  TempSrc: Oral Oral Oral Oral  SpO2: 98% 94% 99% 98%  Weight:      Height:        General: Pt is alert, awake, not in acute distress Cardiovascular: RRR, S1/S2 +, no rubs, no gallops Respiratory: CTA bilaterally, no wheezing, no rhonchi Abdominal: Soft, NT, ND, bowel sounds + Extremities: no edema, no cyanosis    The results of significant diagnostics from this hospitalization (including imaging, microbiology, ancillary and laboratory) are listed below for reference.     Microbiology: Recent Results (from the past 240 hours)  Respiratory (~20 pathogens) panel by PCR     Status: None   Collection Time: 03/15/23  5:33 PM   Specimen: Nasopharyngeal Swab; Respiratory  Result Value Ref Range Status   Adenovirus NOT DETECTED NOT DETECTED Final   Coronavirus 229E NOT DETECTED NOT DETECTED Final    Comment: (NOTE) The Coronavirus on the Respiratory Panel, DOES NOT test for the novel  Coronavirus (2019 nCoV)    Coronavirus HKU1 NOT DETECTED NOT DETECTED Final   Coronavirus NL63 NOT DETECTED NOT DETECTED Final   Coronavirus OC43 NOT DETECTED NOT DETECTED Final   Metapneumovirus NOT DETECTED NOT DETECTED Final   Rhinovirus / Enterovirus NOT DETECTED NOT DETECTED Final   Influenza A NOT DETECTED NOT DETECTED Final   Influenza B NOT DETECTED NOT DETECTED Final   Parainfluenza Virus 1 NOT DETECTED NOT DETECTED  Final   Parainfluenza Virus 2 NOT DETECTED NOT DETECTED Final    Parainfluenza Virus 3 NOT DETECTED NOT DETECTED Final   Parainfluenza Virus 4 NOT DETECTED NOT DETECTED Final   Respiratory Syncytial Virus NOT DETECTED NOT DETECTED Final   Bordetella pertussis NOT DETECTED NOT DETECTED Final   Bordetella Parapertussis NOT DETECTED NOT DETECTED Final   Chlamydophila pneumoniae NOT DETECTED NOT DETECTED Final   Mycoplasma pneumoniae NOT DETECTED NOT DETECTED Final    Comment: Performed at [facility], [address] [place], [place] [address]  Resp panel by RT-PCR (RSV, Flu A&B, Covid) Nasopharyngeal Swab     Status: None   Collection Time: 03/15/23  5:33 PM   Specimen: Nasopharyngeal Swab; Nasal Swab  Result Value Ref Range Status   SARS Coronavirus 2 by RT PCR NEGATIVE NEGATIVE Final   Influenza A by PCR NEGATIVE NEGATIVE Final   Influenza B by PCR NEGATIVE NEGATIVE Final    Comment: (NOTE) The Xpert Xpress SARS-CoV-2/FLU/RSV plus assay is intended as an aid in the diagnosis of influenza from Nasopharyngeal swab specimens and should not be used as a sole basis for treatment. Nasal washings and aspirates are unacceptable for Xpert Xpress SARS-CoV-2/FLU/RSV testing.  Fact Sheet for Patients: BloggerCourse.com  Fact Sheet for Healthcare Providers: SeriousBroker.it  This test is not yet approved or cleared by the [place] FDA and has been authorized for detection and/or diagnosis of SARS-CoV-2 by FDA under an Emergency Use Authorization (EUA). This EUA will remain in effect (meaning this test can be used) for the duration of the COVID-19 declaration under Section 564(b)(1) of the Act, 21 U.S.C. section 360bbb-3(b)(1), unless the authorization is terminated or revoked.     Resp Syncytial Virus by PCR NEGATIVE NEGATIVE Final    Comment: (NOTE) Fact Sheet for Patients: BloggerCourse.com  Fact Sheet for Healthcare  Providers: SeriousBroker.it  This test is not yet approved or cleared by the [place] FDA and has been authorized for detection and/or diagnosis of SARS-CoV-2 by FDA under an Emergency Use Authorization (EUA). This EUA will remain in effect (meaning this test can be used) for the duration of the COVID-19 declaration under Section 564(b)(1) of the Act, 21 U.S.C. section 360bbb-3(b)(1), unless the authorization is terminated or revoked.  Performed at [facility], [address], [place], [place] [address]   Culture, blood (Routine X 2) w Reflex to ID Panel     Status: None   Collection Time: 03/15/23  6:40 PM   Specimen: BLOOD RIGHT HAND  Result Value Ref Range Status   Specimen Description BLOOD RIGHT HAND  Final   Special Requests   Final    BOTTLES DRAWN AEROBIC ONLY Blood Culture results may not be optimal due to an inadequate volume of blood received in culture bottles   Culture   Final    NO GROWTH 5 DAYS Performed at [facility], [address] [place], [place] [address]    Report Status 03/20/2023 FINAL  Final  Culture, blood (Routine X 2) w Reflex to ID Panel     Status: None (Preliminary result)   Collection Time: 03/16/23  4:59 AM   Specimen: BLOOD RIGHT HAND  Result Value Ref Range Status   Specimen Description BLOOD RIGHT HAND  Final   Special Requests   Final    BOTTLES DRAWN AEROBIC AND ANAEROBIC Blood Culture adequate volume   Culture   Final    NO GROWTH 4 DAYS Performed at [facility], [address] [place], [place] [address]    Report Status PENDING  Incomplete  CSF culture w Gram Stain     Status: None   Collection Time: 03/16/23  4:42 PM   Specimen: PATH Cytology CSF; Cerebrospinal Fluid  Result Value Ref Range Status   Specimen Description CSF  Final   Special Requests NONE  Final   Gram Stain   Final    WBC PRESENT, PREDOMINANTLY PMN NO ORGANISMS SEEN CYTOSPIN SMEAR    Culture   Final    NO  GROWTH 3 DAYS Performed at [facility], [address], [place], [place] [address]    Report Status 03/19/2023 FINAL  Final  MRSA Next Gen by PCR, Nasal     Status: None   Collection Time: 03/18/23 10:50 AM   Specimen: Urine, Clean Catch; Nasal Swab  Result Value Ref Range Status   MRSA by PCR Next Gen NOT DETECTED NOT DETECTED Final    Comment: (NOTE) The GeneXpert MRSA Assay (FDA approved for NASAL specimens only), is one component of a comprehensive MRSA colonization surveillance program. It is not intended to diagnose MRSA infection nor to guide or monitor treatment for MRSA infections. Test performance is not FDA approved in patients less than 63 years old. Performed at [facility], [address] [place], [place] [address]      Labs: BNP (last 3 results) Recent Labs    03/17/23 0442 03/18/23 0550 03/19/23 0503  BNP 167.1* 90.6 114.7*   Basic Metabolic Panel: Recent Labs  Lab 03/15/23 1027 03/15/23 1842 03/15/23 2105 03/17/23 0442 03/18/23 0550 03/19/23 0503  NA 135 134*  --  137 131* 134*  K 4.2 3.8  --  3.4* 3.8 4.1  CL 98 101  --  103 108 107  CO2 27  --   --  21* 18* 18*  GLUCOSE 180* 101*  --  95 134* 131*  BUN 13 8  --  7* 10 16  CREATININE 0.74 0.70 0.78 0.85 0.86 0.91  CALCIUM 10.0  --   --  8.8* 8.5* 9.2  MG  --   --   --  1.3* 2.3 2.0   Liver Function Tests: Recent Labs  Lab 03/15/23 1027  AST 21  ALT 13  ALKPHOS 46  BILITOT 0.8  PROT 7.9  ALBUMIN 4.5   No results for input(s): "LIPASE", "AMYLASE" in the last 168 hours. Recent Labs  Lab 03/16/23 0555  AMMONIA 19   CBC: Recent Labs  Lab 03/15/23 1027 03/15/23 1842 03/16/23 0459 03/17/23 0442 03/18/23 0550 03/19/23 0503  WBC 9.9  --  12.8* 8.9 9.7 10.7*  NEUTROABS 6.4  --   --  4.8 6.1 5.6  HGB 12.8 12.2 11.8* 12.5 12.3 12.4  HCT 37.5 36.0 34.9* 36.9 35.5* 36.9  MCV 95.2  --  93.6 93.9 92.4 94.6  PLT 199  --  178 166 173 206   Cardiac Enzymes: No results for  input(s): "CKTOTAL", "CKMB", "CKMBINDEX", "TROPONINI" in the last 168 hours. BNP: Invalid input(s): "POCBNP" CBG: Recent Labs  Lab 03/19/23 1604 03/19/23 2102 03/19/23 2317 03/20/23 0450 03/20/23 0757  GLUCAP 129* 203* 119* 123* 99   D-Dimer No results for input(s): "DDIMER" in the last 72 hours. Hgb A1c No results for input(s): "HGBA1C" in the last 72 hours. Lipid Profile No results for input(s): "CHOL", "HDL", "LDLCALC", "TRIG", "CHOLHDL", "LDLDIRECT" in the last 72 hours. Thyroid function studies No results for input(s): "TSH", "  T4TOTAL", "T3FREE", "THYROIDAB" in the last 72 hours.  Invalid input(s): "FREET3" Anemia work up No results for input(s): "VITAMINB12", "FOLATE", "FERRITIN", "TIBC", "IRON", "RETICCTPCT" in the last 72 hours. Urinalysis    Component Value Date/Time   COLORURINE YELLOW 03/18/2023 1049   APPEARANCEUR CLEAR 03/18/2023 1049   LABSPEC 1.011 03/18/2023 1049   PHURINE 5.0 03/18/2023 1049   GLUCOSEU 50 (A) 03/18/2023 1049   HGBUR SMALL (A) 03/18/2023 1049   BILIRUBINUR NEGATIVE 03/18/2023 1049   KETONESUR 5 (A) 03/18/2023 1049   PROTEINUR NEGATIVE 03/18/2023 1049   UROBILINOGEN 0.2 10/24/2013 1445   NITRITE NEGATIVE 03/18/2023 1049   LEUKOCYTESUR TRACE (A) 03/18/2023 1049   Sepsis Labs Recent Labs  Lab 03/16/23 0459 03/17/23 0442 03/18/23 0550 03/19/23 0503  WBC 12.8* 8.9 9.7 10.7*   Microbiology Recent Results (from the past 240 hours)  Respiratory (~20 pathogens) panel by PCR     Status: None   Collection Time: 03/15/23  5:33 PM   Specimen: Nasopharyngeal Swab; Respiratory  Result Value Ref Range Status   Adenovirus NOT DETECTED NOT DETECTED Final   Coronavirus 229E NOT DETECTED NOT DETECTED Final    Comment: (NOTE) The Coronavirus on the Respiratory Panel, DOES NOT test for the novel  Coronavirus (2019 nCoV)    Coronavirus HKU1 NOT DETECTED NOT DETECTED Final   Coronavirus NL63 NOT DETECTED NOT DETECTED Final   Coronavirus OC43  NOT DETECTED NOT DETECTED Final   Metapneumovirus NOT DETECTED NOT DETECTED Final   Rhinovirus / Enterovirus NOT DETECTED NOT DETECTED Final   Influenza A NOT DETECTED NOT DETECTED Final   Influenza B NOT DETECTED NOT DETECTED Final   Parainfluenza Virus 1 NOT DETECTED NOT DETECTED Final   Parainfluenza Virus 2 NOT DETECTED NOT DETECTED Final   Parainfluenza Virus 3 NOT DETECTED NOT DETECTED Final   Parainfluenza Virus 4 NOT DETECTED NOT DETECTED Final   Respiratory Syncytial Virus NOT DETECTED NOT DETECTED Final   Bordetella pertussis NOT DETECTED NOT DETECTED Final   Bordetella Parapertussis NOT DETECTED NOT DETECTED Final   Chlamydophila pneumoniae NOT DETECTED NOT DETECTED Final   Mycoplasma pneumoniae NOT DETECTED NOT DETECTED Final    Comment: Performed at [facility], [address], [place], [place] [address]  Resp panel by RT-PCR (RSV, Flu A&B, Covid) Nasopharyngeal Swab     Status: None   Collection Time: 03/15/23  5:33 PM   Specimen: Nasopharyngeal Swab; Nasal Swab  Result Value Ref Range Status   SARS Coronavirus 2 by RT PCR NEGATIVE NEGATIVE Final   Influenza A by PCR NEGATIVE NEGATIVE Final   Influenza B by PCR NEGATIVE NEGATIVE Final    Comment: (NOTE) The Xpert Xpress SARS-CoV-2/FLU/RSV plus assay is intended as an aid in the diagnosis of influenza from Nasopharyngeal swab specimens and should not be used as a sole basis for treatment. Nasal washings and aspirates are unacceptable for Xpert Xpress SARS-CoV-2/FLU/RSV testing.  Fact Sheet for Patients: BloggerCourse.com  Fact Sheet for Healthcare Providers: SeriousBroker.it  This test is not yet approved or cleared by the [place] FDA and has been authorized for detection and/or diagnosis of SARS-CoV-2 by FDA under an Emergency Use Authorization (EUA). This EUA will remain in effect (meaning this test can be used) for the duration of the COVID-19  declaration under Section 564(b)(1) of the Act, 21 U.S.C. section 360bbb-3(b)(1), unless the authorization is terminated or revoked.     Resp Syncytial Virus by PCR NEGATIVE NEGATIVE Final    Comment: (NOTE) Fact Sheet for  Patients: BloggerCourse.com  Fact Sheet for Healthcare Providers: SeriousBroker.it  This test is not yet approved or cleared by the [place] FDA and has been authorized for detection and/or diagnosis of SARS-CoV-2 by FDA under an Emergency Use Authorization (EUA). This EUA will remain in effect (meaning this test can be used) for the duration of the COVID-19 declaration under Section 564(b)(1) of the Act, 21 U.S.C. section 360bbb-3(b)(1), unless the authorization is terminated or revoked.  Performed at [facility], [address] [place], [place] [address]   Culture, blood (Routine X 2) w Reflex to ID Panel     Status: None   Collection Time: 03/15/23  6:40 PM   Specimen: BLOOD RIGHT HAND  Result Value Ref Range Status   Specimen Description BLOOD RIGHT HAND  Final   Special Requests   Final    BOTTLES DRAWN AEROBIC ONLY Blood Culture results may not be optimal due to an inadequate volume of blood received in culture bottles   Culture   Final    NO GROWTH 5 DAYS Performed at [facility], [address], [place], [place] [address]    Report Status 03/20/2023 FINAL  Final  Culture, blood (Routine X 2) w Reflex to ID Panel     Status: None (Preliminary result)   Collection Time: 03/16/23  4:59 AM   Specimen: BLOOD RIGHT HAND  Result Value Ref Range Status   Specimen Description BLOOD RIGHT HAND  Final   Special Requests   Final    BOTTLES DRAWN AEROBIC AND ANAEROBIC Blood Culture adequate volume   Culture   Final    NO GROWTH 4 DAYS Performed at [facility], [address] [place], [place] [address]    Report Status PENDING  Incomplete  CSF culture w Gram Stain     Status: None    Collection Time: 03/16/23  4:42 PM   Specimen: PATH Cytology CSF; Cerebrospinal Fluid  Result Value Ref Range Status   Specimen Description CSF  Final   Special Requests NONE  Final   Gram Stain   Final    WBC PRESENT, PREDOMINANTLY PMN NO ORGANISMS SEEN CYTOSPIN SMEAR    Culture   Final    NO GROWTH 3 DAYS Performed at [facility], [address], [place], [place] [address]    Report Status 03/19/2023 FINAL  Final  MRSA Next Gen by PCR, Nasal     Status: None   Collection Time: 03/18/23 10:50 AM   Specimen: Urine, Clean Catch; Nasal Swab  Result Value Ref Range Status   MRSA by PCR Next Gen NOT DETECTED NOT DETECTED Final    Comment: (NOTE) The GeneXpert MRSA Assay (FDA approved for NASAL specimens only), is one component of a comprehensive MRSA colonization surveillance program. It is not intended to diagnose MRSA infection nor to guide or monitor treatment for MRSA infections. Test performance is not FDA approved in patients less than 54 years old. Performed at [facility], [address], [place], [place] [address]      Time coordinating discharge: Over 30 minutes  SIGNED:   Huey Bienenstock, MD  [facility] 03/20/2023, 10:31 AM Pager   If 7PM-7AM, please contact night-coverage [email or web address] Password [email or web address]

## 2023-03-20 NOTE — TOC Transition Note (Signed)
Transition of Care [facility]) - Discharge Note   Patient Details  Name: [name] [name] MRN: [id number] Date of Birth: [birth year]  [facility]) CM/SW Contact:  Mearl Latin, LCSW Phone Number: 03/20/2023, 12:37 PM   Clinical Narrative:    Patient will DC to: [facility] Anticipated DC date: 03/20/23 Family notified: Spouse Transport by: Spouse by car   Per MD patient ready for DC to [facility]. RN to call report prior to discharge [phone] room [phone]). RN, patient, patient's family, and facility notified of DC. Discharge Summary and FL2 sent to facility. DC packet on chart.   CSW will sign off for now as social work intervention is no longer needed. Please consult [place] again if new needs arise.     Final next level of care: Skilled Nursing Facility Barriers to Discharge: Barriers Resolved   Patient Goals and CMS Choice Patient states their goals for this hospitalization and ongoing recovery are:: Return home CMS Medicare.gov Compare Post Acute Care list provided to:: Patient Choice offered to / list presented to : Patient, Spouse [place] ownership interest in [facility].provided to:: Patient    Discharge Placement   Existing PASRR number confirmed : 03/20/23          Patient [facility] bed at: [facility] Patient to be transferred to facility by: [facility] Name of family member notified: Spouse Patient and family notified of of transfer: 03/20/23  Discharge Plan and Services Additional resources added to the After Visit Summary for   In-house Referral: Clinical Social Work   Post Acute Care Choice: Home Health, Skilled Nursing Facility                               Social Drivers of Health (SDOH) Interventions SDOH Screenings   Food Insecurity: Unknown (03/16/2023)  Housing: Patient Declined (03/16/2023)  Transportation Needs: Patient Declined (03/16/2023)  Utilities: Patient Declined (03/16/2023)  Tobacco Use: Low Risk  (03/15/2023)      Readmission Risk Interventions     No data to display

## 2023-03-20 NOTE — TOC Progression Note (Signed)
Transition of Care [facility]) - Progression Note    Patient Details  Name: [name] [name] MRN: [id number] Date of Birth: [birth year]  Transition [facility]) CM/SW Contact  Mearl Latin, LCSW Phone Number: 03/20/2023, 10:20 AM  Clinical Narrative:    [name] able to [facility] patient today. CSW met with patient and spouse at bedside. They have spoken with their daughters and would like to pursue rehab at [facility] prior to return home. Spouse stated he would like to transport patient by car after lunch. CSW updated [name].    Expected Discharge Plan: Skilled Nursing Facility Barriers to Discharge: Barriers Resolved  Expected Discharge Plan and Services In-house Referral: Clinical Social Work   Post Acute Care Choice: Home Health, Skilled Nursing Facility Living arrangements for the past 2 months: Single Family Home                                       Social Determinants of Health (SDOH) Interventions SDOH Screenings   Food Insecurity: Unknown (03/16/2023)  Housing: Patient Declined (03/16/2023)  Transportation Needs: Patient Declined (03/16/2023)  Utilities: Patient Declined (03/16/2023)  Tobacco Use: Low Risk  (03/15/2023)    Readmission Risk Interventions     No data to display

## 2023-03-20 NOTE — Progress Notes (Signed)
Mobility Specialist Progress Note;   03/20/23 0905  Mobility  Activity Ambulated with assistance in room;Transferred to/from [facility]  Level of Assistance Minimal assist, patient does 75% or more  Assistive Device Front wheel walker  Distance Ambulated (ft) 20 ft  Activity Response Tolerated well  Mobility Referral Yes  Mobility visit 1 Mobility  Mobility Specialist Start Time (ACUTE ONLY) [id number]  Mobility Specialist Stop Time (ACUTE ONLY) 0925  Mobility Specialist Time Calculation (min) (ACUTE ONLY) 20 min   Pt agreeable to mobility. Requested assistance to [facility] during session, void successful. Pt able to ambulate to sink to brush teeth and back to chair to sit. Required MinA throughout ambulation for safety. No c/o during session. Pt left in chair with all needs met, alarm on. Husband in room.   Caesar Bookman Mobility Specialist Please contact via SecureChat or [employer] [phone]

## 2023-03-21 LAB — CULTURE, BLOOD (ROUTINE X 2)
Culture: NO GROWTH
Special Requests: ADEQUATE

## 2023-03-21 LAB — VITAMIN B1: Vitamin B1 (Thiamine): 148.8 nmol/L (ref 66.5–200.0)

## 2023-03-23 DIAGNOSIS — M48061 Spinal stenosis, lumbar region without neurogenic claudication: Secondary | ICD-10-CM | POA: Diagnosis not present

## 2023-03-23 DIAGNOSIS — I110 Hypertensive heart disease with heart failure: Secondary | ICD-10-CM | POA: Diagnosis not present

## 2023-03-23 DIAGNOSIS — R569 Unspecified convulsions: Secondary | ICD-10-CM | POA: Diagnosis not present

## 2023-03-23 DIAGNOSIS — E119 Type 2 diabetes mellitus without complications: Secondary | ICD-10-CM | POA: Diagnosis not present

## 2023-03-23 DIAGNOSIS — I5032 Chronic diastolic (congestive) heart failure: Secondary | ICD-10-CM | POA: Diagnosis not present

## 2023-03-24 DIAGNOSIS — M48061 Spinal stenosis, lumbar region without neurogenic claudication: Secondary | ICD-10-CM | POA: Diagnosis not present

## 2023-03-24 DIAGNOSIS — I429 Cardiomyopathy, unspecified: Secondary | ICD-10-CM | POA: Diagnosis not present

## 2023-03-24 DIAGNOSIS — Z741 Need for assistance with personal care: Secondary | ICD-10-CM | POA: Diagnosis not present

## 2023-03-24 DIAGNOSIS — I5032 Chronic diastolic (congestive) heart failure: Secondary | ICD-10-CM | POA: Diagnosis not present

## 2023-03-24 DIAGNOSIS — K219 Gastro-esophageal reflux disease without esophagitis: Secondary | ICD-10-CM | POA: Diagnosis not present

## 2023-03-24 DIAGNOSIS — M1A9XX0 Chronic gout, unspecified, without tophus (tophi): Secondary | ICD-10-CM | POA: Diagnosis not present

## 2023-03-24 DIAGNOSIS — M6281 Muscle weakness (generalized): Secondary | ICD-10-CM | POA: Diagnosis not present

## 2023-03-24 DIAGNOSIS — G459 Transient cerebral ischemic attack, unspecified: Secondary | ICD-10-CM | POA: Diagnosis not present

## 2023-03-24 DIAGNOSIS — I2510 Atherosclerotic heart disease of native coronary artery without angina pectoris: Secondary | ICD-10-CM | POA: Diagnosis not present

## 2023-03-24 DIAGNOSIS — R569 Unspecified convulsions: Secondary | ICD-10-CM | POA: Diagnosis not present

## 2023-03-24 DIAGNOSIS — G40909 Epilepsy, unspecified, not intractable, without status epilepticus: Secondary | ICD-10-CM | POA: Diagnosis not present

## 2023-03-24 DIAGNOSIS — E119 Type 2 diabetes mellitus without complications: Secondary | ICD-10-CM | POA: Diagnosis not present

## 2023-03-24 DIAGNOSIS — I110 Hypertensive heart disease with heart failure: Secondary | ICD-10-CM | POA: Diagnosis not present

## 2023-03-25 DIAGNOSIS — M48061 Spinal stenosis, lumbar region without neurogenic claudication: Secondary | ICD-10-CM | POA: Diagnosis not present

## 2023-03-25 DIAGNOSIS — E119 Type 2 diabetes mellitus without complications: Secondary | ICD-10-CM | POA: Diagnosis not present

## 2023-03-25 DIAGNOSIS — I110 Hypertensive heart disease with heart failure: Secondary | ICD-10-CM | POA: Diagnosis not present

## 2023-03-25 DIAGNOSIS — I5032 Chronic diastolic (congestive) heart failure: Secondary | ICD-10-CM | POA: Diagnosis not present

## 2023-03-25 DIAGNOSIS — G40909 Epilepsy, unspecified, not intractable, without status epilepticus: Secondary | ICD-10-CM | POA: Diagnosis not present

## 2023-03-27 DIAGNOSIS — I110 Hypertensive heart disease with heart failure: Secondary | ICD-10-CM | POA: Diagnosis not present

## 2023-03-27 DIAGNOSIS — M48061 Spinal stenosis, lumbar region without neurogenic claudication: Secondary | ICD-10-CM | POA: Diagnosis not present

## 2023-03-27 DIAGNOSIS — I5032 Chronic diastolic (congestive) heart failure: Secondary | ICD-10-CM | POA: Diagnosis not present

## 2023-03-27 DIAGNOSIS — R569 Unspecified convulsions: Secondary | ICD-10-CM | POA: Diagnosis not present

## 2023-03-27 DIAGNOSIS — E119 Type 2 diabetes mellitus without complications: Secondary | ICD-10-CM | POA: Diagnosis not present

## 2023-03-30 DIAGNOSIS — I110 Hypertensive heart disease with heart failure: Secondary | ICD-10-CM | POA: Diagnosis not present

## 2023-03-30 DIAGNOSIS — I429 Cardiomyopathy, unspecified: Secondary | ICD-10-CM | POA: Diagnosis not present

## 2023-03-30 DIAGNOSIS — K219 Gastro-esophageal reflux disease without esophagitis: Secondary | ICD-10-CM | POA: Diagnosis not present

## 2023-03-30 DIAGNOSIS — M4800 Spinal stenosis, site unspecified: Secondary | ICD-10-CM | POA: Diagnosis not present

## 2023-03-30 DIAGNOSIS — E119 Type 2 diabetes mellitus without complications: Secondary | ICD-10-CM | POA: Diagnosis not present

## 2023-03-30 DIAGNOSIS — I2510 Atherosclerotic heart disease of native coronary artery without angina pectoris: Secondary | ICD-10-CM | POA: Diagnosis not present

## 2023-03-30 DIAGNOSIS — G40109 Localization-related (focal) (partial) symptomatic epilepsy and epileptic syndromes with simple partial seizures, not intractable, without status epilepticus: Secondary | ICD-10-CM | POA: Diagnosis not present

## 2023-03-30 DIAGNOSIS — I5032 Chronic diastolic (congestive) heart failure: Secondary | ICD-10-CM | POA: Diagnosis not present

## 2023-03-30 DIAGNOSIS — E069 Thyroiditis, unspecified: Secondary | ICD-10-CM | POA: Diagnosis not present

## 2023-04-03 DIAGNOSIS — I10 Essential (primary) hypertension: Secondary | ICD-10-CM | POA: Diagnosis not present

## 2023-04-03 DIAGNOSIS — Z9989 Dependence on other enabling machines and devices: Secondary | ICD-10-CM | POA: Diagnosis not present

## 2023-04-03 DIAGNOSIS — G40909 Epilepsy, unspecified, not intractable, without status epilepticus: Secondary | ICD-10-CM | POA: Diagnosis not present

## 2023-04-03 DIAGNOSIS — R4189 Other symptoms and signs involving cognitive functions and awareness: Secondary | ICD-10-CM | POA: Diagnosis not present

## 2023-04-03 DIAGNOSIS — E119 Type 2 diabetes mellitus without complications: Secondary | ICD-10-CM | POA: Diagnosis not present

## 2023-04-03 DIAGNOSIS — M48061 Spinal stenosis, lumbar region without neurogenic claudication: Secondary | ICD-10-CM | POA: Diagnosis not present

## 2023-04-03 DIAGNOSIS — Z79899 Other long term (current) drug therapy: Secondary | ICD-10-CM | POA: Diagnosis not present

## 2023-04-06 DIAGNOSIS — K219 Gastro-esophageal reflux disease without esophagitis: Secondary | ICD-10-CM | POA: Diagnosis not present

## 2023-04-06 DIAGNOSIS — E119 Type 2 diabetes mellitus without complications: Secondary | ICD-10-CM | POA: Diagnosis not present

## 2023-04-06 DIAGNOSIS — I429 Cardiomyopathy, unspecified: Secondary | ICD-10-CM | POA: Diagnosis not present

## 2023-04-06 DIAGNOSIS — G40109 Localization-related (focal) (partial) symptomatic epilepsy and epileptic syndromes with simple partial seizures, not intractable, without status epilepticus: Secondary | ICD-10-CM | POA: Diagnosis not present

## 2023-04-06 DIAGNOSIS — I110 Hypertensive heart disease with heart failure: Secondary | ICD-10-CM | POA: Diagnosis not present

## 2023-04-06 DIAGNOSIS — E069 Thyroiditis, unspecified: Secondary | ICD-10-CM | POA: Diagnosis not present

## 2023-04-06 DIAGNOSIS — I2510 Atherosclerotic heart disease of native coronary artery without angina pectoris: Secondary | ICD-10-CM | POA: Diagnosis not present

## 2023-04-06 DIAGNOSIS — M4800 Spinal stenosis, site unspecified: Secondary | ICD-10-CM | POA: Diagnosis not present

## 2023-04-06 DIAGNOSIS — I5032 Chronic diastolic (congestive) heart failure: Secondary | ICD-10-CM | POA: Diagnosis not present

## 2023-04-09 DIAGNOSIS — M4800 Spinal stenosis, site unspecified: Secondary | ICD-10-CM | POA: Diagnosis not present

## 2023-04-09 DIAGNOSIS — G40109 Localization-related (focal) (partial) symptomatic epilepsy and epileptic syndromes with simple partial seizures, not intractable, without status epilepticus: Secondary | ICD-10-CM | POA: Diagnosis not present

## 2023-04-09 DIAGNOSIS — I2510 Atherosclerotic heart disease of native coronary artery without angina pectoris: Secondary | ICD-10-CM | POA: Diagnosis not present

## 2023-04-09 DIAGNOSIS — I110 Hypertensive heart disease with heart failure: Secondary | ICD-10-CM | POA: Diagnosis not present

## 2023-04-09 DIAGNOSIS — E119 Type 2 diabetes mellitus without complications: Secondary | ICD-10-CM | POA: Diagnosis not present

## 2023-04-09 DIAGNOSIS — I429 Cardiomyopathy, unspecified: Secondary | ICD-10-CM | POA: Diagnosis not present

## 2023-04-09 DIAGNOSIS — E069 Thyroiditis, unspecified: Secondary | ICD-10-CM | POA: Diagnosis not present

## 2023-04-09 DIAGNOSIS — K219 Gastro-esophageal reflux disease without esophagitis: Secondary | ICD-10-CM | POA: Diagnosis not present

## 2023-04-09 DIAGNOSIS — I5032 Chronic diastolic (congestive) heart failure: Secondary | ICD-10-CM | POA: Diagnosis not present

## 2023-04-10 DIAGNOSIS — I5032 Chronic diastolic (congestive) heart failure: Secondary | ICD-10-CM | POA: Diagnosis not present

## 2023-04-10 DIAGNOSIS — G40109 Localization-related (focal) (partial) symptomatic epilepsy and epileptic syndromes with simple partial seizures, not intractable, without status epilepticus: Secondary | ICD-10-CM | POA: Diagnosis not present

## 2023-04-10 DIAGNOSIS — I110 Hypertensive heart disease with heart failure: Secondary | ICD-10-CM | POA: Diagnosis not present

## 2023-04-10 DIAGNOSIS — I429 Cardiomyopathy, unspecified: Secondary | ICD-10-CM | POA: Diagnosis not present

## 2023-04-10 DIAGNOSIS — E119 Type 2 diabetes mellitus without complications: Secondary | ICD-10-CM | POA: Diagnosis not present

## 2023-04-10 DIAGNOSIS — E069 Thyroiditis, unspecified: Secondary | ICD-10-CM | POA: Diagnosis not present

## 2023-04-10 DIAGNOSIS — I2510 Atherosclerotic heart disease of native coronary artery without angina pectoris: Secondary | ICD-10-CM | POA: Diagnosis not present

## 2023-04-10 DIAGNOSIS — K219 Gastro-esophageal reflux disease without esophagitis: Secondary | ICD-10-CM | POA: Diagnosis not present

## 2023-04-10 DIAGNOSIS — M4800 Spinal stenosis, site unspecified: Secondary | ICD-10-CM | POA: Diagnosis not present

## 2023-04-12 DIAGNOSIS — M47816 Spondylosis without myelopathy or radiculopathy, lumbar region: Secondary | ICD-10-CM | POA: Diagnosis not present

## 2023-04-12 DIAGNOSIS — M48062 Spinal stenosis, lumbar region with neurogenic claudication: Secondary | ICD-10-CM | POA: Diagnosis not present

## 2023-04-14 DIAGNOSIS — M4800 Spinal stenosis, site unspecified: Secondary | ICD-10-CM | POA: Diagnosis not present

## 2023-04-14 DIAGNOSIS — I2510 Atherosclerotic heart disease of native coronary artery without angina pectoris: Secondary | ICD-10-CM | POA: Diagnosis not present

## 2023-04-14 DIAGNOSIS — K219 Gastro-esophageal reflux disease without esophagitis: Secondary | ICD-10-CM | POA: Diagnosis not present

## 2023-04-14 DIAGNOSIS — G40109 Localization-related (focal) (partial) symptomatic epilepsy and epileptic syndromes with simple partial seizures, not intractable, without status epilepticus: Secondary | ICD-10-CM | POA: Diagnosis not present

## 2023-04-14 DIAGNOSIS — E069 Thyroiditis, unspecified: Secondary | ICD-10-CM | POA: Diagnosis not present

## 2023-04-14 DIAGNOSIS — E119 Type 2 diabetes mellitus without complications: Secondary | ICD-10-CM | POA: Diagnosis not present

## 2023-04-14 DIAGNOSIS — I429 Cardiomyopathy, unspecified: Secondary | ICD-10-CM | POA: Diagnosis not present

## 2023-04-14 DIAGNOSIS — I110 Hypertensive heart disease with heart failure: Secondary | ICD-10-CM | POA: Diagnosis not present

## 2023-04-14 DIAGNOSIS — I5032 Chronic diastolic (congestive) heart failure: Secondary | ICD-10-CM | POA: Diagnosis not present

## 2023-04-16 DIAGNOSIS — I110 Hypertensive heart disease with heart failure: Secondary | ICD-10-CM | POA: Diagnosis not present

## 2023-04-16 DIAGNOSIS — I429 Cardiomyopathy, unspecified: Secondary | ICD-10-CM | POA: Diagnosis not present

## 2023-04-16 DIAGNOSIS — I5032 Chronic diastolic (congestive) heart failure: Secondary | ICD-10-CM | POA: Diagnosis not present

## 2023-04-16 DIAGNOSIS — E119 Type 2 diabetes mellitus without complications: Secondary | ICD-10-CM | POA: Diagnosis not present

## 2023-04-16 DIAGNOSIS — I2510 Atherosclerotic heart disease of native coronary artery without angina pectoris: Secondary | ICD-10-CM | POA: Diagnosis not present

## 2023-04-16 DIAGNOSIS — K219 Gastro-esophageal reflux disease without esophagitis: Secondary | ICD-10-CM | POA: Diagnosis not present

## 2023-04-16 DIAGNOSIS — G40109 Localization-related (focal) (partial) symptomatic epilepsy and epileptic syndromes with simple partial seizures, not intractable, without status epilepticus: Secondary | ICD-10-CM | POA: Diagnosis not present

## 2023-04-16 DIAGNOSIS — M4800 Spinal stenosis, site unspecified: Secondary | ICD-10-CM | POA: Diagnosis not present

## 2023-04-16 DIAGNOSIS — E069 Thyroiditis, unspecified: Secondary | ICD-10-CM | POA: Diagnosis not present

## 2023-04-17 DIAGNOSIS — K219 Gastro-esophageal reflux disease without esophagitis: Secondary | ICD-10-CM | POA: Diagnosis not present

## 2023-04-17 DIAGNOSIS — I110 Hypertensive heart disease with heart failure: Secondary | ICD-10-CM | POA: Diagnosis not present

## 2023-04-17 DIAGNOSIS — E069 Thyroiditis, unspecified: Secondary | ICD-10-CM | POA: Diagnosis not present

## 2023-04-17 DIAGNOSIS — M4800 Spinal stenosis, site unspecified: Secondary | ICD-10-CM | POA: Diagnosis not present

## 2023-04-17 DIAGNOSIS — I429 Cardiomyopathy, unspecified: Secondary | ICD-10-CM | POA: Diagnosis not present

## 2023-04-17 DIAGNOSIS — G40109 Localization-related (focal) (partial) symptomatic epilepsy and epileptic syndromes with simple partial seizures, not intractable, without status epilepticus: Secondary | ICD-10-CM | POA: Diagnosis not present

## 2023-04-17 DIAGNOSIS — I2510 Atherosclerotic heart disease of native coronary artery without angina pectoris: Secondary | ICD-10-CM | POA: Diagnosis not present

## 2023-04-17 DIAGNOSIS — E119 Type 2 diabetes mellitus without complications: Secondary | ICD-10-CM | POA: Diagnosis not present

## 2023-04-17 DIAGNOSIS — I5032 Chronic diastolic (congestive) heart failure: Secondary | ICD-10-CM | POA: Diagnosis not present

## 2023-04-20 DIAGNOSIS — I429 Cardiomyopathy, unspecified: Secondary | ICD-10-CM | POA: Diagnosis not present

## 2023-04-20 DIAGNOSIS — I5032 Chronic diastolic (congestive) heart failure: Secondary | ICD-10-CM | POA: Diagnosis not present

## 2023-04-20 DIAGNOSIS — K219 Gastro-esophageal reflux disease without esophagitis: Secondary | ICD-10-CM | POA: Diagnosis not present

## 2023-04-20 DIAGNOSIS — E069 Thyroiditis, unspecified: Secondary | ICD-10-CM | POA: Diagnosis not present

## 2023-04-20 DIAGNOSIS — M4800 Spinal stenosis, site unspecified: Secondary | ICD-10-CM | POA: Diagnosis not present

## 2023-04-20 DIAGNOSIS — E119 Type 2 diabetes mellitus without complications: Secondary | ICD-10-CM | POA: Diagnosis not present

## 2023-04-20 DIAGNOSIS — I110 Hypertensive heart disease with heart failure: Secondary | ICD-10-CM | POA: Diagnosis not present

## 2023-04-20 DIAGNOSIS — G40109 Localization-related (focal) (partial) symptomatic epilepsy and epileptic syndromes with simple partial seizures, not intractable, without status epilepticus: Secondary | ICD-10-CM | POA: Diagnosis not present

## 2023-04-20 DIAGNOSIS — I2510 Atherosclerotic heart disease of native coronary artery without angina pectoris: Secondary | ICD-10-CM | POA: Diagnosis not present

## 2023-04-22 DIAGNOSIS — I110 Hypertensive heart disease with heart failure: Secondary | ICD-10-CM | POA: Diagnosis not present

## 2023-04-22 DIAGNOSIS — M4800 Spinal stenosis, site unspecified: Secondary | ICD-10-CM | POA: Diagnosis not present

## 2023-04-22 DIAGNOSIS — G40109 Localization-related (focal) (partial) symptomatic epilepsy and epileptic syndromes with simple partial seizures, not intractable, without status epilepticus: Secondary | ICD-10-CM | POA: Diagnosis not present

## 2023-04-22 DIAGNOSIS — K219 Gastro-esophageal reflux disease without esophagitis: Secondary | ICD-10-CM | POA: Diagnosis not present

## 2023-04-22 DIAGNOSIS — E119 Type 2 diabetes mellitus without complications: Secondary | ICD-10-CM | POA: Diagnosis not present

## 2023-04-22 DIAGNOSIS — E069 Thyroiditis, unspecified: Secondary | ICD-10-CM | POA: Diagnosis not present

## 2023-04-22 DIAGNOSIS — I2510 Atherosclerotic heart disease of native coronary artery without angina pectoris: Secondary | ICD-10-CM | POA: Diagnosis not present

## 2023-04-22 DIAGNOSIS — I429 Cardiomyopathy, unspecified: Secondary | ICD-10-CM | POA: Diagnosis not present

## 2023-04-22 DIAGNOSIS — I5032 Chronic diastolic (congestive) heart failure: Secondary | ICD-10-CM | POA: Diagnosis not present

## 2023-04-27 ENCOUNTER — Telehealth: Payer: Self-pay | Admitting: Neurology

## 2023-04-27 DIAGNOSIS — E119 Type 2 diabetes mellitus without complications: Secondary | ICD-10-CM | POA: Diagnosis not present

## 2023-04-27 DIAGNOSIS — I5032 Chronic diastolic (congestive) heart failure: Secondary | ICD-10-CM | POA: Diagnosis not present

## 2023-04-27 DIAGNOSIS — M4800 Spinal stenosis, site unspecified: Secondary | ICD-10-CM | POA: Diagnosis not present

## 2023-04-27 DIAGNOSIS — I2510 Atherosclerotic heart disease of native coronary artery without angina pectoris: Secondary | ICD-10-CM | POA: Diagnosis not present

## 2023-04-27 DIAGNOSIS — E069 Thyroiditis, unspecified: Secondary | ICD-10-CM | POA: Diagnosis not present

## 2023-04-27 DIAGNOSIS — G40109 Localization-related (focal) (partial) symptomatic epilepsy and epileptic syndromes with simple partial seizures, not intractable, without status epilepticus: Secondary | ICD-10-CM | POA: Diagnosis not present

## 2023-04-27 DIAGNOSIS — I110 Hypertensive heart disease with heart failure: Secondary | ICD-10-CM | POA: Diagnosis not present

## 2023-04-27 DIAGNOSIS — I429 Cardiomyopathy, unspecified: Secondary | ICD-10-CM | POA: Diagnosis not present

## 2023-04-27 DIAGNOSIS — K219 Gastro-esophageal reflux disease without esophagitis: Secondary | ICD-10-CM | POA: Diagnosis not present

## 2023-04-27 NOTE — Telephone Encounter (Signed)
Pt confirming appt.

## 2023-04-28 ENCOUNTER — Other Ambulatory Visit: Payer: Self-pay | Admitting: Cardiology

## 2023-04-28 ENCOUNTER — Other Ambulatory Visit: Payer: Self-pay | Admitting: Neurology

## 2023-04-28 MED ORDER — LACOSAMIDE 100 MG PO TABS: 100.0000 mg | ORAL_TABLET | Freq: Two times a day (BID) | ORAL | 0 refills | Status: DC

## 2023-04-28 NOTE — Telephone Encounter (Signed)
Requested Prescriptions   Pending Prescriptions Disp Refills   Lacosamide 100 MG TABS 60 tablet 0    Sig: Take 1 tablet (100 mg total) by mouth 2 (two) times daily.  Not seen in clinic recently Upcoming 05/12/23 w/Camara  Dispenses   Dispensed Days Supply Quantity Provider Pharmacy  LACOSAMIDE 100 MG TABLET 03/27/2023 30 60 each Irven Baltimore, NP [facility] [phone] - [facility]...  LACOSAMIDE 100 MG TABLET 03/20/2023 30 60 each Elgergawy, Leana Roe, MD [facility] - [place], [place].Marland KitchenMarland Kitchen

## 2023-04-28 NOTE — Telephone Encounter (Signed)
Pt requesting refill of Lacosamide 100 MG TABS . [facility] to [facility] [phone]

## 2023-04-29 ENCOUNTER — Other Ambulatory Visit: Payer: Self-pay | Admitting: Neurology

## 2023-04-29 DIAGNOSIS — E069 Thyroiditis, unspecified: Secondary | ICD-10-CM | POA: Diagnosis not present

## 2023-04-29 DIAGNOSIS — E119 Type 2 diabetes mellitus without complications: Secondary | ICD-10-CM | POA: Diagnosis not present

## 2023-04-29 DIAGNOSIS — K219 Gastro-esophageal reflux disease without esophagitis: Secondary | ICD-10-CM | POA: Diagnosis not present

## 2023-04-29 DIAGNOSIS — M4800 Spinal stenosis, site unspecified: Secondary | ICD-10-CM | POA: Diagnosis not present

## 2023-04-29 DIAGNOSIS — I2510 Atherosclerotic heart disease of native coronary artery without angina pectoris: Secondary | ICD-10-CM | POA: Diagnosis not present

## 2023-04-29 DIAGNOSIS — I429 Cardiomyopathy, unspecified: Secondary | ICD-10-CM | POA: Diagnosis not present

## 2023-04-29 DIAGNOSIS — I5032 Chronic diastolic (congestive) heart failure: Secondary | ICD-10-CM | POA: Diagnosis not present

## 2023-04-29 DIAGNOSIS — I110 Hypertensive heart disease with heart failure: Secondary | ICD-10-CM | POA: Diagnosis not present

## 2023-04-29 DIAGNOSIS — G40109 Localization-related (focal) (partial) symptomatic epilepsy and epileptic syndromes with simple partial seizures, not intractable, without status epilepticus: Secondary | ICD-10-CM | POA: Diagnosis not present

## 2023-04-29 MED ORDER — LACOSAMIDE 100 MG PO TABS: 100.0000 mg | ORAL_TABLET | Freq: Two times a day (BID) | ORAL | 0 refills | Status: DC

## 2023-04-29 NOTE — Telephone Encounter (Signed)
[name] [name] with [facility] states they cannot accept a faxed Rx for this medication. States it can submitted verbally, Escribed, or a hard copy given to pt. Requesting call back to [phone]

## 2023-04-29 NOTE — Telephone Encounter (Signed)
Pt spouse has called to check on the medication being called in. It was confirmed with April, RN that  Lacosamide 100 MG TABS will be

## 2023-04-29 NOTE — Telephone Encounter (Signed)
Faxed script for vimpat to [id number]. 30 day supply until appointment

## 2023-04-29 NOTE — Telephone Encounter (Signed)
Pt checking status of refill. States she is completely out of the medication as of today. Requesting call back

## 2023-04-29 NOTE — Addendum Note (Signed)
Addended by: Lenn Cal on: 04/29/2023 04:57 PM   Modules accepted: Orders

## 2023-05-12 ENCOUNTER — Encounter: Payer: Self-pay | Admitting: Neurology

## 2023-05-12 ENCOUNTER — Ambulatory Visit: Payer: Medicare PPO | Admitting: Neurology

## 2023-05-12 VITALS — BP 133/74 | HR 78 | Ht 61.0 in | Wt 157.0 lb

## 2023-05-12 DIAGNOSIS — R569 Unspecified convulsions: Secondary | ICD-10-CM

## 2023-05-12 DIAGNOSIS — G459 Transient cerebral ischemic attack, unspecified: Secondary | ICD-10-CM | POA: Diagnosis not present

## 2023-05-12 DIAGNOSIS — Z5181 Encounter for therapeutic drug level monitoring: Secondary | ICD-10-CM

## 2023-05-12 MED ORDER — LACOSAMIDE 100 MG PO TABS: 100.0000 mg | ORAL_TABLET | Freq: Two times a day (BID) | ORAL | 5 refills | Status: DC

## 2023-05-12 NOTE — Progress Notes (Signed)
 [facility]  PATIENT: [name] [name] DOB: 11/05/1945  REQUESTING CLINICIAN: Dwight Trula SQUIBB, MD HISTORY FROM: Patient, Husband and daughter  REASON FOR VISIT: Seizure/Cognitive impairment    HISTORICAL  CHIEF COMPLAINT:  Chief Complaint  Patient presents with   New Patient (Initial Visit)    [id number], daughter and husband present, NP/internal hospital referral for seizures:denied sz since leaving hospital     HISTORY OF PRESENT ILLNESS:  This 78 year old woman past medical history hypertension, hyperlipidemia, diabetes mellitus, heart disease, who is presenting after being admitted to the hospital for TIA and/or seizure.  History mainly obtained from husband today.  The day of presentation, patient was noted to be confused by husband.  She made coffee but did not put water  in the coffee maker and told her husband that the coffee maker broke.  She was trying to get her husband breakfast when she dropped milk, then at that time husband noted that words were not coming out, and she did have right facial weakness.  She was taken to the hospital.  Initially her stroke workup including MRI brain did not show any acute stroke.  Due to concern of encephalitis/meningitis, patient was treated with antibiotics and had LP.  LP was bland,  EEG showed epileptiform discharges in the left frontal area and left frontal slowing.  She was thought to have a seizure.  Patient was started on Keppra  but did develop a rash, not sure if the rash was from the Keppra  or antibiotic.  Keppra  was discontinued and patient started on lacosamide .  Since discharge from the hospital, she was completed rehab for 1 week and now is at home.  She is still doing home PT.  Family tells me that her main complaint now are tiredness and fatigue. She reports compliance with the medication but there are complaints of tiredness, fatigue and tremors.   Handedness: Right handed   Onset: 03/15/2023  Seizure Type: Confusion,  slurred speech,   Current frequency: only once   Any injuries from seizures: Denies   Seizure risk factors: Prior strokes   Previous ASMs: None   Currenty ASMs: Lacosamide  100 mg twice daily   ASMs side effects: Sleepiness, tiredness, tremors   Brain Images: No acute findings, chronic strokes   Previous EEGs: Left frontal epileptiform discharges, left frontal slowing     Hospital course and summary  78 y.o. female with PMH significant of coronary artery disease, chronic diastolic CHF, diabetes mellitus 2, HTN, hyperlipidemia AR s/p bioprosthetic valve repair, CABG in 2015, s/p aortic root replacement, CAD, Thyroiditis  presented to ED with sudden confusion and aphasia this morning. She was also noted to have right facial droop, came to the ER and was seen by neurology and admitted to the hospital for further workup.  She was started on antibiotics due to concern for meningitis. LP was completed, negative for infection. History and findings were concerning more for seizures giving similar events in the past, neurology were consulted, please see discussion below.   Severe toxic and metabolic encephalopathy present on admission.  Seizures -Patient underwent stroke workup including MRI brain, CTA head and neck, P shows unremarkable CSF, overnight EEG raises question of some epileptogenicity. - Per patient's family, she has been having memory issues for last 6 to 9 months, ambulates with a cane,   - UA was unremarkable, stable ammonia, stable TSH, - low B12 - replaced.  Continue with supplements on discharge -Was empirically on B1 supplements, follow on levels, still pending at  time of discharge -Concern for seizures, was on LTM EEG,  Interictal sharp waves were present on EEG. She was started and then dosage was I, LTM EEG continues to show sharp waves and evidence of epiletogenicity.  Keppra  dose was adjusted and increased, no further waves on Keppra  750 mg p.o. twice daily, patient did  develop rash in the back and the chest area, concern for Keppra  causing the rash, but to be on the cautious side, her Keppra  has been changed to Vimpat  100 mg p.o. twice daily . -Neurology team reviewed seizure precautions and no driving for 6 months with the patient and her husband at bedside    OTHER MEDICAL CONDITIONS: Hypertension, hyperlipidemia, TIA, heart disease, Diabetes Mellitus II.   REVIEW OF SYSTEMS: Full 14 system review of systems performed and negative with exception of: As noted in the HPI  ALLERGIES: Allergies  Allergen Reactions   Atenolol Shortness Of Breath and Other (See Comments)    Edema and Headache   Lisinopril  Cough   Lorazepam  Other (See Comments)    Over sedation. Family does not want lorazepam .    Penicillin G Other (See Comments)    Other Reaction(s): Not available   Penicillins Other (See Comments) and Rash    Has patient had a PCN reaction causing immediate rash, facial/tongue/throat swelling, SOB or lightheadedness with hypotension: No  Has patient had a PCN reaction causing severe rash involving mucus membranes or skin necrosis: No  Has patient had a PCN reaction that required hospitalization No  Has patient had a PCN reaction occurring within the last 10 years: No  If all of the above answers are NO, then may proceed with Cephalosporin use.G Benzathine: Local injection rash  Other Reaction(s): Not available   Sulfa Antibiotics Hives and Rash    HOME MEDICATIONS: Outpatient Medications Prior to Visit  Medication Sig Dispense Refill   Apoaequorin (PREVAGEN) 10 MG CAPS Take 1 tablet by mouth daily.     aspirin  81 MG chewable tablet Chew 1 tablet (81 mg total) by mouth 2 (two) times daily. 60 tablet 1   atorvastatin  (LIPITOR) 10 MG tablet TAKE 1 TABLET BY MOUTH EVERY DAY 90 tablet 0   Calcium  Carb-Cholecalciferol (CALCIUM  600-D PO) Take 1 tablet by mouth daily.     carvedilol  (COREG ) 6.25 MG tablet TAKE 1 TABLET BY MOUTH 2 TIMES DAILY WITH A  MEAL. 180 tablet 3   clindamycin  (CLEOCIN ) 150 MG capsule Take 4 capsules by mouth as needed. (Take one (1) hour prior to dental procedures)  2   colchicine  0.6 MG tablet Take 0.6 mg by mouth daily.     cyanocobalamin  1000 MCG tablet Take 1 tablet (1,000 mcg total) by mouth daily.     glimepiride  (AMARYL ) 2 MG tablet Take 2 mg by mouth daily with breakfast.     GLUCOSAMINE-CHONDROITIN PO Take 1 tablet by mouth daily. Glucosamine 1200 mg, chondroitin 1500 mg     metFORMIN  (GLUCOPHAGE -XR) 500 MG 24 hr tablet Take 1,000 mg by mouth in the morning and at bedtime.     Multiple Vitamin (MULTIVITAMIN WITH MINERALS) TABS tablet Take 1 tablet by mouth daily. Centrum Silver     omeprazole (PRILOSEC) 40 MG capsule Take 40 mg by mouth daily.     ONE TOUCH ULTRA TEST test strip      pramipexole  (MIRAPEX ) 1.5 MG tablet Take 1.5 mg by mouth at bedtime.     thiamine  (VITAMIN B-1) 100 MG tablet Take 1 tablet (100 mg total) by mouth  daily.     Lacosamide  100 MG TABS Take 1 tablet (100 mg total) by mouth 2 (two) times daily. 60 tablet 0   ranitidine (ZANTAC) 150 MG tablet Take 150 mg by mouth daily as needed for heartburn.     No facility-administered medications prior to visit.    PAST MEDICAL HISTORY: Past Medical History:  Diagnosis Date   Adenomatous colon polyp    Aortic regurgitation    severe by TEE s/p pericardia tissue AVR   Arthritis    Cardiomyopathy (HCC)    EF initally 25% but now normalized   Carotid stenosis 08/17/2017   1-39% bilateral by dopplers 2022   Chronic diastolic CHF (congestive heart failure), NYHA class 2 (HCC)    Coronary artery disease 10/05/2013   Severe mid LAD stenosis s/p 1 vessel CABG with LIMA to LAD   Diabetes mellitus without complication (HCC)    DX 20 YRS AGO.   TYPE 2   Dilated aortic root (HCC)    GERD (gastroesophageal reflux disease)    Tums prn   Gout    Hypercholesteremia    LDL goal < 70   Hypertension    S/P Bio-Bentall aortic root replacement with  bioprosthetic valve conduit 10/27/2013   23 mm [facility] Ease bovine pericardial tissue valve and 26 mm Vascutek gelweave Val-Salva aortic graft   S/P CABG x 1 10/27/2013   LIMA to LAD   Thyroiditis 10/2009   lab and u/s-thyroid  function normalized in 9/11    PAST SURGICAL HISTORY: Past Surgical History:  Procedure Laterality Date   ABDOMINAL HYSTERECTOMY     AORTIC VALVE REPLACEMENT N/A 10/27/2013   Procedure: AORTIC VALVE REPLACEMENT (AVR);  Surgeon: Sudie VEAR Laine, MD;  Location: [facility] OR;  Service: Open Heart Surgery;  Laterality: N/A;   ASCENDING AORTIC ROOT REPLACEMENT N/A 10/27/2013   Procedure: ASCENDING AORTIC ROOT REPLACEMENT;  Surgeon: Sudie VEAR Laine, MD;  Location: [facility] OR;  Service: Open Heart Surgery;  Laterality: N/A;   BUNIONECTOMY     bilateral   CARDIAC CATHETERIZATION     CARDIAC VALVE REPLACEMENT     AORTIC VALVE   CARPAL TUNNEL RELEASE Right    CATARACT EXTRACTION Bilateral    COLONOSCOPY     COLONOSCOPY WITH PROPOFOL  N/A 05/08/2015   Procedure: COLONOSCOPY WITH PROPOFOL ;  Surgeon: Gladis MARLA Louder, MD;  Location: [facility] ENDOSCOPY;  Service: Endoscopy;  Laterality: N/A;   CORONARY ARTERY BYPASS GRAFT N/A 10/27/2013   Procedure: CORONARY ARTERY BYPASS GRAFTING (CABG) x 1 - LIMA to LAD;  Surgeon: Sudie VEAR Laine, MD;  Location: [facility] OR;  Service: Open Heart Surgery;  Laterality: N/A;   EYE SURGERY     BILATERAL CATARACTS   INTRAOPERATIVE TRANSESOPHAGEAL ECHOCARDIOGRAM N/A 10/27/2013   Procedure: INTRAOPERATIVE TRANSESOPHAGEAL ECHOCARDIOGRAM;  Surgeon: Sudie VEAR Laine, MD;  Location: [facility] OR;  Service: Open Heart Surgery;  Laterality: N/A;   JOINT REPLACEMENT     left total knee   KNEE ARTHROPLASTY Right 01/26/2017   Procedure: RIGHT TOTAL KNEE ARTHROPLASTY WITH COMPUTER NAVIGATION;  Surgeon: Fidel Rogue, MD;  Location: [facility] OR;  Service: Orthopedics;  Laterality: Right;  Needs RNFA   KNEE ARTHROPLASTY Left 05/28/2017   Procedure: LEFT TOTAL KNEE ARTHROPLASTY WITH COMPUTER  NAVIGATION;  Surgeon: Fidel Rogue, MD;  Location: [facility] ORS;  Service: Orthopedics;  Laterality: Left;  Needs RNFA   LEFT AND RIGHT HEART CATHETERIZATION WITH CORONARY ANGIOGRAM N/A 10/05/2013   Procedure: LEFT AND RIGHT HEART CATHETERIZATION WITH CORONARY ANGIOGRAM;  Surgeon: Ozell BIRCH  Wonda, MD;  Location: [facility] CATH LAB;  Service: Cardiovascular;  Laterality: N/A;   TEE WITHOUT CARDIOVERSION N/A 10/04/2013   Procedure: TRANSESOPHAGEAL ECHOCARDIOGRAM (TEE);  Surgeon: Wilbert JONELLE Bihari, MD;  Location: [facility] ENDOSCOPY;  Service: Cardiovascular;  Laterality: N/A;   tubular adenomatous polyp colonoscopy  12/31/2009    FAMILY HISTORY: Family History  Problem Relation Age of Onset   Hypertension Mother    Diabetes Mother    Arthritis/Rheumatoid Mother    Heart attack Father    Stroke Sister    Cancer Brother     SOCIAL HISTORY: Social History   Socioeconomic History   Marital status: Married    Spouse name: Not on file   Number of children: Not on file   Years of education: Not on file   Highest education level: Not on file  Occupational History   Not on file  Tobacco Use   Smoking status: Never   Smokeless tobacco: Never  Vaping Use   Vaping status: Never Used  Substance and Sexual Activity   Alcohol  use: No   Drug use: No   Sexual activity: Not on file  Other Topics Concern   Not on file  Social History Narrative   Not on file   Social Drivers of Health   Financial Resource Strain: Not on file  Food Insecurity: Unknown (03/16/2023)   Hunger Vital Sign    Worried About Running Out of Food in the Last Year: Never true    Ran Out of Food in the Last Year: Patient declined  Transportation Needs: Patient Declined (03/16/2023)   PRAPARE - Administrator, Civil Service (Medical): Patient declined    Lack of Transportation (Non-Medical): Patient declined  Physical Activity: Not on file  Stress: Not on file  Social Connections: Not on file  Intimate Partner Violence:  Patient Unable To Answer (03/16/2023)   Humiliation, Afraid, Rape, and Kick questionnaire    Fear of Current or Ex-Partner: Patient unable to answer    Emotionally Abused: Patient unable to answer    Physically Abused: Patient unable to answer    Sexually Abused: Patient unable to answer     PHYSICAL EXAM  GENERAL EXAM/CONSTITUTIONAL: Vitals:  Vitals:   05/12/23 0947  BP: 133/74  Pulse: 78  Weight: 157 lb (71.2 kg)  Height: 5' 1 (1.549 m)   Body mass index is 29.66 kg/m. Wt Readings from Last 3 Encounters:  05/12/23 157 lb (71.2 kg)  03/15/23 161 lb 2.5 oz (73.1 kg)  04/17/22 165 lb (74.8 kg)   Patient is in no distress; well developed, nourished and groomed; neck is supple  MUSCULOSKELETAL: Gait, strength, tone, movements noted in Neurologic exam below  NEUROLOGIC: MENTAL STATUS:      No data to display         awake, alert, oriented to person, place and time Difficulty remembering hospital stay normal attention and concentration language fluent, comprehension intact, naming intact fund of knowledge appropriate  CRANIAL NERVE:  2nd, 3rd, 4th, 6th - Visual fields full to confrontation, extraocular muscles intact, no nystagmus 5th - facial sensation symmetric 7th - facial strength symmetric 8th - hearing intact 9th - palate elevates symmetrically, uvula midline 11th - shoulder shrug symmetric 12th - tongue protrusion midline  MOTOR:  normal bulk and tone, full strength in the BUE, BLE  SENSORY:  normal and symmetric to light touch  COORDINATION:  finger-nose-finger, fine finger movements normal  GAIT/STATION:  normal   DIAGNOSTIC DATA (LABS, IMAGING, TESTING) -  I reviewed patient records, labs, notes, testing and imaging myself where available.  Lab Results  Component Value Date   WBC 10.7 (H) 03/19/2023   HGB 12.4 03/19/2023   HCT 36.9 03/19/2023   MCV 94.6 03/19/2023   PLT 206 03/19/2023      Component Value Date/Time   NA 139  05/12/2023 1040   K 4.3 05/12/2023 1040   CL 100 05/12/2023 1040   CO2 20 05/12/2023 1040   GLUCOSE 197 (H) 05/12/2023 1040   GLUCOSE 131 (H) 03/19/2023 0503   BUN 13 05/12/2023 1040   CREATININE 0.80 05/12/2023 1040   CALCIUM  9.5 05/12/2023 1040   PROT 7.9 03/15/2023 1027   ALBUMIN  4.5 03/15/2023 1027   AST 21 03/15/2023 1027   ALT 13 03/15/2023 1027   ALKPHOS 46 03/15/2023 1027   BILITOT 0.8 03/15/2023 1027   GFRNONAA >60 03/19/2023 0503   GFRAA >60 05/29/2017 0533   Lab Results  Component Value Date   CHOL 111 03/16/2023   HDL 59 03/16/2023   LDLCALC 31 03/16/2023   TRIG 107 03/16/2023   Lab Results  Component Value Date   HGBA1C 7.1 (H) 03/16/2023   Lab Results  Component Value Date   VITAMINB12 117 (L) 03/15/2023   Lab Results  Component Value Date   TSH 1.086 03/15/2023    MRI Brain 03/17/2023 1. No acute intracranial abnormality. 2. Mild chronic white matter disease. 3. Small remote chronic infarcts in the bilateral centrum semiovale, corona radiata and chronic cortical infarct in the left frontal lobe. 4. Small foci of hemosiderin deposit in the bilateral occipital horns and left temporal horn of the lateral ventricles as well as in the left frontal lobe. 5. Bilateral mastoid effusion  EEG 03/16/2023 - Sharp wave, left frontal region.  - Continuous slow, left hemisphere   I personally reviewed brain Image.   ASSESSMENT AND PLAN  78 y.o. year old female  with history of hypertension, hyperlipidemia, diabetes mellitus, heart disease, who is presenting after likely a seizure.  Patient initially presented to the hospital due to confusion and slurred speech, MRI negative for acute stroke, EEG with left frontal slowing and left frontal epileptiform discharges.  She is currently on Vimpat  100 mg twice daily but there are complaints of tiredness, fatigue and tremor.  Plan will be to obtain a Vimpat  level if elevated, will likely decrease it to 50 mg twice daily.   Advised them to continue the other medications, and also advised patient and family to follow-up in 6 months, at that time we will do a more memory oriented evaluation.  They voiced understanding understanding.  Return sooner if worse.   1. Seizure (HCC)   2. Therapeutic drug monitoring   3. TIA (transient ischemic attack)     Patient Instructions  Continue current medications Will check a Vimpat  level, if elevated will likely decrease to 50 mg twice daily Follow-up in 6 months, at that time we will do a memory oriented evaluation Return sooner if worse.   Per [place] statutes, patients with seizures are not allowed to drive until they have been seizure-free for six months.  Other recommendations include using caution when using heavy equipment or power tools. Avoid working on ladders or at heights. Take showers instead of baths.  Do not swim alone.  Ensure the water  temperature is not too high on the home water  heater. Do not go swimming alone. Do not lock yourself in a room alone (i.e. bathroom). When caring for  infants or small children, sit down when holding, feeding, or changing them to minimize risk of injury to the child in the event you have a seizure. Maintain good sleep hygiene. Avoid alcohol .  Also recommend adequate sleep, hydration, good diet and minimize stress.   During the Seizure  - First, ensure adequate ventilation and place patients on the floor on their left side  Loosen clothing around the neck and ensure the airway is patent. If the patient is clenching the teeth, do not force the mouth open with any object as this can cause severe damage - Remove all items from the surrounding that can be hazardous. The patient may be oblivious to what's happening and may not even know what he or she is doing. If the patient is confused and wandering, either gently guide him/her away and block access to outside areas - Reassure the individual and be comforting - Call 911.  In most cases, the seizure ends before EMS arrives. However, there are cases when seizures may last over 3 to 5 minutes. Or the individual may have developed breathing difficulties or severe injuries. If a pregnant patient or a person with diabetes develops a seizure, it is prudent to call an ambulance. - Finally, if the patient does not regain full consciousness, then call EMS. Most patients will remain confused for about 45 to 90 minutes after a seizure, so you must use judgment in calling for help. - Avoid restraints but make sure the patient is in a bed with padded side rails - Place the individual in a lateral position with the neck slightly flexed; this will help the saliva drain from the mouth and prevent the tongue from falling backward - Remove all nearby furniture and other hazards from the area - Provide verbal assurance as the individual is regaining consciousness - Provide the patient with privacy if possible - Call for help and start treatment as ordered by the caregiver   After the Seizure (Postictal Stage)  After a seizure, most patients experience confusion, fatigue, muscle pain and/or a headache. Thus, one should permit the individual to sleep. For the next few days, reassurance is essential. Being calm and helping reorient the person is also of importance.  Most seizures are painless and end spontaneously. Seizures are not harmful to others but can lead to complications such as stress on the lungs, brain and the heart. Individuals with prior lung problems may develop labored breathing and respiratory distress.    Discussed Patients with epilepsy have a small risk of sudden unexpected death, a condition referred to as sudden unexpected death in epilepsy (SUDEP). SUDEP is defined specifically as the sudden, unexpected, witnessed or unwitnessed, nontraumatic and nondrowning death in patients with epilepsy with or without evidence for a seizure, and excluding documented status  epilepticus, in which post mortem examination does not reveal a structural or toxicologic cause for death     Orders Placed This Encounter  Procedures   Lacosamide    Basic Metabolic Panel    Meds ordered this encounter  Medications   Lacosamide  100 MG TABS    Sig: Take 1 tablet (100 mg total) by mouth 2 (two) times daily.    Dispense:  60 tablet    Refill:  5    Return in about 6 months (around 11/09/2023).    Pastor Falling, MD 05/17/2023, 3:19 PM  [facility] [address], [address] [place], [place] [address] [phone]

## 2023-05-13 DIAGNOSIS — Z09 Encounter for follow-up examination after completed treatment for conditions other than malignant neoplasm: Secondary | ICD-10-CM | POA: Diagnosis not present

## 2023-05-13 DIAGNOSIS — H0235 Blepharochalasis left lower eyelid: Secondary | ICD-10-CM | POA: Diagnosis not present

## 2023-05-13 DIAGNOSIS — H0232 Blepharochalasis right lower eyelid: Secondary | ICD-10-CM | POA: Diagnosis not present

## 2023-05-13 DIAGNOSIS — H0279 Other degenerative disorders of eyelid and periocular area: Secondary | ICD-10-CM | POA: Diagnosis not present

## 2023-05-13 DIAGNOSIS — H57813 Brow ptosis, bilateral: Secondary | ICD-10-CM | POA: Diagnosis not present

## 2023-05-14 DIAGNOSIS — I429 Cardiomyopathy, unspecified: Secondary | ICD-10-CM | POA: Diagnosis not present

## 2023-05-14 DIAGNOSIS — I110 Hypertensive heart disease with heart failure: Secondary | ICD-10-CM | POA: Diagnosis not present

## 2023-05-14 DIAGNOSIS — I5032 Chronic diastolic (congestive) heart failure: Secondary | ICD-10-CM | POA: Diagnosis not present

## 2023-05-14 DIAGNOSIS — G40109 Localization-related (focal) (partial) symptomatic epilepsy and epileptic syndromes with simple partial seizures, not intractable, without status epilepticus: Secondary | ICD-10-CM | POA: Diagnosis not present

## 2023-05-14 DIAGNOSIS — E069 Thyroiditis, unspecified: Secondary | ICD-10-CM | POA: Diagnosis not present

## 2023-05-14 DIAGNOSIS — K219 Gastro-esophageal reflux disease without esophagitis: Secondary | ICD-10-CM | POA: Diagnosis not present

## 2023-05-14 DIAGNOSIS — E119 Type 2 diabetes mellitus without complications: Secondary | ICD-10-CM | POA: Diagnosis not present

## 2023-05-14 DIAGNOSIS — M4800 Spinal stenosis, site unspecified: Secondary | ICD-10-CM | POA: Diagnosis not present

## 2023-05-14 DIAGNOSIS — I2510 Atherosclerotic heart disease of native coronary artery without angina pectoris: Secondary | ICD-10-CM | POA: Diagnosis not present

## 2023-05-16 LAB — BASIC METABOLIC PANEL
BUN/Creatinine Ratio: 16 (ref 12–28)
BUN: 13 mg/dL (ref 8–27)
CO2: 20 mmol/L (ref 20–29)
Calcium: 9.5 mg/dL (ref 8.7–10.3)
Chloride: 100 mmol/L (ref 96–106)
Creatinine, Ser: 0.8 mg/dL (ref 0.57–1.00)
Glucose: 197 mg/dL — ABNORMAL HIGH (ref 70–99)
Potassium: 4.3 mmol/L (ref 3.5–5.2)
Sodium: 139 mmol/L (ref 134–144)
eGFR: 75 mL/min/{1.73_m2} (ref >59)

## 2023-05-16 LAB — LACOSAMIDE: Lacosamide: 13.6 ug/mL — ABNORMAL HIGH (ref 5.0–10.0)

## 2023-05-17 NOTE — Patient Instructions (Signed)
 Continue current medications Will check a Vimpat  level, if elevated will likely decrease to 50 mg twice daily Follow-up in 6 months, at that time we will do a memory oriented evaluation Return sooner if worse.

## 2023-05-18 ENCOUNTER — Other Ambulatory Visit: Payer: Self-pay | Admitting: Neurology

## 2023-05-18 DIAGNOSIS — M5416 Radiculopathy, lumbar region: Secondary | ICD-10-CM | POA: Diagnosis not present

## 2023-05-18 MED ORDER — LACOSAMIDE 50 MG PO TABS: 50.0000 mg | ORAL_TABLET | Freq: Two times a day (BID) | ORAL | 5 refills | Status: DC

## 2023-05-18 NOTE — Progress Notes (Signed)
 Please call and advise the patient/family that the recent Vimpat  level was elevated possibly causing the side effects. Please decrease Vimpat  to 50 mg (1/2 tablet) twice daily until you get the new prescription.  Please remind patient to keep any upcoming appointments or tests and to call us  with any interim questions, concerns, problems or updates. Thanks,   Pastor Falling, MD

## 2023-05-20 DIAGNOSIS — Z79899 Other long term (current) drug therapy: Secondary | ICD-10-CM | POA: Diagnosis not present

## 2023-05-20 DIAGNOSIS — I429 Cardiomyopathy, unspecified: Secondary | ICD-10-CM | POA: Diagnosis not present

## 2023-05-20 DIAGNOSIS — E1169 Type 2 diabetes mellitus with other specified complication: Secondary | ICD-10-CM | POA: Diagnosis not present

## 2023-05-20 DIAGNOSIS — K219 Gastro-esophageal reflux disease without esophagitis: Secondary | ICD-10-CM | POA: Diagnosis not present

## 2023-05-20 DIAGNOSIS — M48061 Spinal stenosis, lumbar region without neurogenic claudication: Secondary | ICD-10-CM | POA: Diagnosis not present

## 2023-05-20 DIAGNOSIS — Z0000 Encounter for general adult medical examination without abnormal findings: Secondary | ICD-10-CM | POA: Diagnosis not present

## 2023-05-20 DIAGNOSIS — E1129 Type 2 diabetes mellitus with other diabetic kidney complication: Secondary | ICD-10-CM | POA: Diagnosis not present

## 2023-05-20 DIAGNOSIS — M4800 Spinal stenosis, site unspecified: Secondary | ICD-10-CM | POA: Diagnosis not present

## 2023-05-20 DIAGNOSIS — I10 Essential (primary) hypertension: Secondary | ICD-10-CM | POA: Diagnosis not present

## 2023-05-20 DIAGNOSIS — I2510 Atherosclerotic heart disease of native coronary artery without angina pectoris: Secondary | ICD-10-CM | POA: Diagnosis not present

## 2023-05-20 DIAGNOSIS — Z1331 Encounter for screening for depression: Secondary | ICD-10-CM | POA: Diagnosis not present

## 2023-05-20 DIAGNOSIS — E7800 Pure hypercholesterolemia, unspecified: Secondary | ICD-10-CM | POA: Diagnosis not present

## 2023-05-20 DIAGNOSIS — I872 Venous insufficiency (chronic) (peripheral): Secondary | ICD-10-CM | POA: Diagnosis not present

## 2023-05-20 DIAGNOSIS — I110 Hypertensive heart disease with heart failure: Secondary | ICD-10-CM | POA: Diagnosis not present

## 2023-05-20 DIAGNOSIS — G40109 Localization-related (focal) (partial) symptomatic epilepsy and epileptic syndromes with simple partial seizures, not intractable, without status epilepticus: Secondary | ICD-10-CM | POA: Diagnosis not present

## 2023-05-20 DIAGNOSIS — I5032 Chronic diastolic (congestive) heart failure: Secondary | ICD-10-CM | POA: Diagnosis not present

## 2023-05-20 DIAGNOSIS — E069 Thyroiditis, unspecified: Secondary | ICD-10-CM | POA: Diagnosis not present

## 2023-05-20 DIAGNOSIS — E119 Type 2 diabetes mellitus without complications: Secondary | ICD-10-CM | POA: Diagnosis not present

## 2023-05-20 DIAGNOSIS — G40909 Epilepsy, unspecified, not intractable, without status epilepticus: Secondary | ICD-10-CM | POA: Diagnosis not present

## 2023-05-26 DIAGNOSIS — E069 Thyroiditis, unspecified: Secondary | ICD-10-CM | POA: Diagnosis not present

## 2023-05-26 DIAGNOSIS — K219 Gastro-esophageal reflux disease without esophagitis: Secondary | ICD-10-CM | POA: Diagnosis not present

## 2023-05-26 DIAGNOSIS — M4800 Spinal stenosis, site unspecified: Secondary | ICD-10-CM | POA: Diagnosis not present

## 2023-05-26 DIAGNOSIS — I429 Cardiomyopathy, unspecified: Secondary | ICD-10-CM | POA: Diagnosis not present

## 2023-05-26 DIAGNOSIS — G40109 Localization-related (focal) (partial) symptomatic epilepsy and epileptic syndromes with simple partial seizures, not intractable, without status epilepticus: Secondary | ICD-10-CM | POA: Diagnosis not present

## 2023-05-26 DIAGNOSIS — I110 Hypertensive heart disease with heart failure: Secondary | ICD-10-CM | POA: Diagnosis not present

## 2023-05-26 DIAGNOSIS — E119 Type 2 diabetes mellitus without complications: Secondary | ICD-10-CM | POA: Diagnosis not present

## 2023-05-26 DIAGNOSIS — I2510 Atherosclerotic heart disease of native coronary artery without angina pectoris: Secondary | ICD-10-CM | POA: Diagnosis not present

## 2023-05-26 DIAGNOSIS — I5032 Chronic diastolic (congestive) heart failure: Secondary | ICD-10-CM | POA: Diagnosis not present

## 2023-06-05 DIAGNOSIS — E069 Thyroiditis, unspecified: Secondary | ICD-10-CM | POA: Diagnosis not present

## 2023-06-05 DIAGNOSIS — K219 Gastro-esophageal reflux disease without esophagitis: Secondary | ICD-10-CM | POA: Diagnosis not present

## 2023-06-05 DIAGNOSIS — I2510 Atherosclerotic heart disease of native coronary artery without angina pectoris: Secondary | ICD-10-CM | POA: Diagnosis not present

## 2023-06-05 DIAGNOSIS — I110 Hypertensive heart disease with heart failure: Secondary | ICD-10-CM | POA: Diagnosis not present

## 2023-06-05 DIAGNOSIS — G40109 Localization-related (focal) (partial) symptomatic epilepsy and epileptic syndromes with simple partial seizures, not intractable, without status epilepticus: Secondary | ICD-10-CM | POA: Diagnosis not present

## 2023-06-05 DIAGNOSIS — I5032 Chronic diastolic (congestive) heart failure: Secondary | ICD-10-CM | POA: Diagnosis not present

## 2023-06-05 DIAGNOSIS — M4800 Spinal stenosis, site unspecified: Secondary | ICD-10-CM | POA: Diagnosis not present

## 2023-06-05 DIAGNOSIS — I429 Cardiomyopathy, unspecified: Secondary | ICD-10-CM | POA: Diagnosis not present

## 2023-06-05 DIAGNOSIS — E119 Type 2 diabetes mellitus without complications: Secondary | ICD-10-CM | POA: Diagnosis not present

## 2023-06-09 NOTE — Progress Notes (Unsigned)
 9 Date:  06/10/2023   ID:  [name] [name], DOB June 28, 1945, MRN [id number]   PCP:  Thana Ates, MD  Cardiologist:  Armanda Magic, MD  Electrophysiologist:  None   Chief Complaint:  AI, HTN, DCM, CAD, lipids  History of Present Illness:     [name] [name] is a 78 y.o. female with a hx of PVC's, dilated aortic root, severe AR, HTN, severe LV dysfunction with EF 25% (now normalized), severe mid LAD stenosis s/p 1 vessel CABG (LIMA to LAD) and Bentall procedure with aortic root replacement with pericardial tissue AVR .    She is here today for followup and is doing well.  She has had some problems over the past few months with SOB and LE edema. She was seen by her PCP and started on Lasix for 1 week.  She thinks it may have helped some with her SOB but her legs are still swollen. She denies any chest pain or pressure,  PND, orthopnea,  dizziness, palpitations or syncope. She is compliant with her meds and is tolerating meds with no SE.  She  Prior CV studies:   The following studies were reviewed today:  2D echo 09/2019 IMPRESSIONS    1. Left ventricular ejection fraction, by estimation, is 50 to 55%. The  left ventricle has low normal function. The left ventricle has no regional  wall motion abnormalities. Left ventricular diastolic parameters are  consistent with Grade II diastolic  dysfunction (pseudonormalization).   2. Right ventricular systolic function is normal. The right ventricular  size is normal. There is normal pulmonary artery systolic pressure.   3. Left atrial size was mildly dilated.   4. Right atrial size was mildly dilated.   5. The mitral valve is normal in structure. Trivial mitral valve  regurgitation. No evidence of mitral stenosis.   6. The aortic valve has been repaired/replaced. Aortic valve  regurgitation is not visualized. No aortic stenosis is present.   Past Medical History:  Diagnosis Date   Adenomatous colon polyp    Aortic regurgitation    severe by  TEE s/p pericardia tissue AVR   Arthritis    Cardiomyopathy (HCC)    EF initally 25% but now normalized   Carotid stenosis 08/17/2017   1-39% bilateral by dopplers 2022   Chronic diastolic CHF (congestive heart failure), NYHA class 2 (HCC)    Coronary artery disease 10/05/2013   Severe mid LAD stenosis s/p 1 vessel CABG with LIMA to LAD   Diabetes mellitus without complication (HCC)    DX 20 YRS AGO.   TYPE 2   Dilated aortic root (HCC)    GERD (gastroesophageal reflux disease)    Tums prn   Gout    Hypercholesteremia    LDL goal < 70   Hypertension    S/P Bio-Bentall aortic root replacement with bioprosthetic valve conduit 10/27/2013   23 mm [facility] Ease bovine pericardial tissue valve and 26 mm Vascutek gelweave Val-Salva aortic graft   S/P CABG x 1 10/27/2013   LIMA to LAD   Thyroiditis 10/2009   lab and u/s-thyroid function normalized in 9/11   Past Surgical History:  Procedure Laterality Date   ABDOMINAL HYSTERECTOMY     AORTIC VALVE REPLACEMENT N/A 10/27/2013   Procedure: AORTIC VALVE REPLACEMENT (AVR);  Surgeon: Purcell Nails, MD;  Location: [facility] OR;  Service: Open Heart Surgery;  Laterality: N/A;   ASCENDING AORTIC ROOT REPLACEMENT N/A 10/27/2013   Procedure: ASCENDING AORTIC ROOT REPLACEMENT;  Surgeon: Purcell Nails, MD;  Location: [facility] OR;  Service: Open Heart Surgery;  Laterality: N/A;   BUNIONECTOMY     bilateral   CARDIAC CATHETERIZATION     CARDIAC VALVE REPLACEMENT     AORTIC VALVE   CARPAL TUNNEL RELEASE Right    CATARACT EXTRACTION Bilateral    COLONOSCOPY     COLONOSCOPY WITH PROPOFOL N/A 05/08/2015   Procedure: COLONOSCOPY WITH PROPOFOL;  Surgeon: Charolett Bumpers, MD;  Location: [facility] ENDOSCOPY;  Service: Endoscopy;  Laterality: N/A;   CORONARY ARTERY BYPASS GRAFT N/A 10/27/2013   Procedure: CORONARY ARTERY BYPASS GRAFTING (CABG) x 1 - LIMA to LAD;  Surgeon: Purcell Nails, MD;  Location: [facility] OR;  Service: Open Heart Surgery;  Laterality: N/A;   EYE SURGERY      BILATERAL CATARACTS   INTRAOPERATIVE TRANSESOPHAGEAL ECHOCARDIOGRAM N/A 10/27/2013   Procedure: INTRAOPERATIVE TRANSESOPHAGEAL ECHOCARDIOGRAM;  Surgeon: Purcell Nails, MD;  Location: [facility] OR;  Service: Open Heart Surgery;  Laterality: N/A;   JOINT REPLACEMENT     left total knee   KNEE ARTHROPLASTY Right 01/26/2017   Procedure: RIGHT TOTAL KNEE ARTHROPLASTY WITH COMPUTER NAVIGATION;  Surgeon: Samson Frederic, MD;  Location: [facility] OR;  Service: Orthopedics;  Laterality: Right;  Needs RNFA   KNEE ARTHROPLASTY Left 05/28/2017   Procedure: LEFT TOTAL KNEE ARTHROPLASTY WITH COMPUTER NAVIGATION;  Surgeon: Samson Frederic, MD;  Location: [facility] ORS;  Service: Orthopedics;  Laterality: Left;  Needs RNFA   LEFT AND RIGHT HEART CATHETERIZATION WITH CORONARY ANGIOGRAM N/A 10/05/2013   Procedure: LEFT AND RIGHT HEART CATHETERIZATION WITH CORONARY ANGIOGRAM;  Surgeon: Micheline Chapman, MD;  Location: [facility] CATH LAB;  Service: Cardiovascular;  Laterality: N/A;   TEE WITHOUT CARDIOVERSION N/A 10/04/2013   Procedure: TRANSESOPHAGEAL ECHOCARDIOGRAM (TEE);  Surgeon: Quintella Reichert, MD;  Location: [facility] ENDOSCOPY;  Service: Cardiovascular;  Laterality: N/A;   tubular adenomatous polyp colonoscopy  12/31/2009     No outpatient medications have been marked as taking for the 06/10/23 encounter (Office Visit) with Quintella Reichert, MD.     Allergies:   Atenolol, Lisinopril, Lorazepam, Penicillin g, Penicillins, and Sulfa antibiotics   Social History   Tobacco Use   Smoking status: Never   Smokeless tobacco: Never  Vaping Use   Vaping status: Never Used  Substance Use Topics   Alcohol use: No   Drug use: No     Family Hx: The patient's family history includes Arthritis/Rheumatoid in her mother; Cancer in her brother; Diabetes in her mother; Heart attack in her father; Hypertension in her mother; Stroke in her sister.  ROS:   Please see the history of present illness.     All other systems reviewed and are  negative.   Labs/Other Tests and Data Reviewed:    Recent Labs: 03/15/2023: ALT 13; TSH 1.086 03/19/2023: B Natriuretic Peptide 114.7; Hemoglobin 12.4; Magnesium 2.0; Platelets 206 05/12/2023: BUN 13; Creatinine, Ser 0.80; Potassium 4.3; Sodium 139   Recent Lipid Panel Lab Results  Component Value Date/Time   CHOL 111 03/16/2023 04:59 AM   TRIG 107 03/16/2023 04:59 AM   HDL 59 03/16/2023 04:59 AM   CHOLHDL 1.9 03/16/2023 04:59 AM   LDLCALC 31 03/16/2023 04:59 AM    Wt Readings from Last 3 Encounters:  05/12/23 157 lb (71.2 kg)  03/15/23 161 lb 2.5 oz (73.1 kg)  04/17/22 165 lb (74.8 kg)     Objective:    Vital Signs:  There were no vitals taken for this visit.  GEN: Well nourished, well developed in no acute distress HEENT: Normal NECK: No JVD; No carotid bruits LYMPHATICS: No lymphadenopathy CARDIAC:RRR, no  rubs, gallops 2/6 SM at RUSB RESPIRATORY:  Clear to auscultation without rales, wheezing or rhonchi  ABDOMEN: Soft, non-tender, non-distended MUSCULOSKELETAL:  No edema; No deformity  SKIN: Warm and dry NEUROLOGIC:  Alert and oriented x 3 PSYCHIATRIC:  Normal affect  ASSESSMENT & PLAN:   ASCAD  - remote cath showed severe mid LAD stenosis s/p 1 vessel CABG (LIMA to LAD) at the time of her Bentall procedure in 2015.   -Stress PET CT 04/26/2022 showed no ischemia and normal LV function with normal myocardial blood flow reserve -2D echo 03/27/2023 showed EF 40 to 45% with global HK -She denies any anginal symptoms since I saw her last -Continue prescription drug managed with aspirin 81 mg daily, atorvastatin 10 mg daily, carvedilol 6.25 mg twice daily with as needed refills  Hypertension  -BP controlled on exam today -Continue prescription drug management with carvedilol 6.25 mg twice daily with as needed refills  Severe AI  - she is status post Bentall procedure with aortic root replacement in 2015.   -2D echo 03/16/2023 with stable 23 mm bioprosthetic Magna Ease  AVR with mean AVG 11 mmHg and no perivalvular leak  Chronic combined systolic /diastolic CHF  SOB -2D echo 03/27/2023 showed EF 40 to 45% -recently started having LE edema and SOB and started on diuretics for 1 week with some improvement in SOB but not in LE edema -She appears euvolemic on exam today -Continue prescription drug managed with carvedilol 6.25 mg twice daily -I am going to repeat Stress PET CT to rule out ischemia given drop in EF -Informed Consent   Shared Decision Making/Informed Consent The risks [chest pain, shortness of breath, cardiac arrhythmias, dizziness, blood pressure fluctuations, myocardial infarction, stroke/transient ischemic attack, nausea, vomiting, allergic reaction, radiation exposure, metallic taste sensation and life-threatening complications (estimated to be 1 in 10,000)], benefits (risk stratification, diagnosing coronary artery disease, treatment guidance) and alternatives of a cardiac PET stress test were discussed in detail with Ms. Galloway and she agrees to proceed. -check BNP  Hyperlipidemia  -her LDL goal is less than 70.  -I have personally reviewed and interpreted outside labs performed by patient's PCP which showed LDL 31, HDL 59, ALT 13 on  03/16/23 -Continue prescription drug management with atorvastatin 10 mg daily with as needed refills   Bilateral carotid artery stenosis  -Dopplers done 08/24/2020 showed 1 to 39% bilateral carotid stenosis -Continue aspirin and statin therapy -Repeat Dopplers  followup with me in 1 year   Medication Adjustments/Labs and Tests Ordered: Current medicines are reviewed at length with the patient today.  Concerns regarding medicines are outlined above.  Tests Ordered: No orders of the defined types were placed in this encounter.  Medication Changes: No orders of the defined types were placed in this encounter.   Disposition:  Follow up in 1 year(s)  Signed, Armanda Magic, MD  06/10/2023 1:55 PM    [facility]

## 2023-06-10 ENCOUNTER — Encounter: Payer: Self-pay | Admitting: Cardiology

## 2023-06-10 ENCOUNTER — Ambulatory Visit: Payer: Medicare PPO | Attending: Cardiology | Admitting: Cardiology

## 2023-06-10 VITALS — BP 120/66 | HR 51 | Ht 61.0 in | Wt 158.0 lb

## 2023-06-10 DIAGNOSIS — I10 Essential (primary) hypertension: Secondary | ICD-10-CM

## 2023-06-10 DIAGNOSIS — I2510 Atherosclerotic heart disease of native coronary artery without angina pectoris: Secondary | ICD-10-CM | POA: Diagnosis not present

## 2023-06-10 DIAGNOSIS — I351 Nonrheumatic aortic (valve) insufficiency: Secondary | ICD-10-CM

## 2023-06-10 DIAGNOSIS — I6523 Occlusion and stenosis of bilateral carotid arteries: Secondary | ICD-10-CM | POA: Diagnosis not present

## 2023-06-10 DIAGNOSIS — E785 Hyperlipidemia, unspecified: Secondary | ICD-10-CM | POA: Diagnosis not present

## 2023-06-10 DIAGNOSIS — I5042 Chronic combined systolic (congestive) and diastolic (congestive) heart failure: Secondary | ICD-10-CM

## 2023-06-10 MED ORDER — FUROSEMIDE 20 MG PO TABS: 20.0000 mg | ORAL_TABLET | Freq: Every day | ORAL | 11 refills | Status: AC | PRN

## 2023-06-10 NOTE — Patient Instructions (Signed)
 Medication Instructions:  Your physician has recommended you make the following change in your medication:  START: furosemide (Lasix) 20 mg by mouth once daily as needed for weight gain of 3 lbs in 24 hours or 5 lbs in a week  *If you need a refill on your cardiac medications before your next appointment, please call your pharmacy*   Lab Work: BNP  If you have labs (blood work) drawn today and your tests are completely normal, you will receive your results only by: MyChart Message (if you have MyChart) OR A paper copy in the mail If you have any lab test that is abnormal or we need to change your treatment, we will call you to review the results.   Testing/Procedures: Your physician has requested that you have a carotid duplex. This test is an ultrasound of the carotid arteries in your neck. It looks at blood flow through these arteries that supply the brain with blood. Allow one hour for this exam. There are no restrictions or special instructions.   Your physician has requested that you have a Cardiac PET Stress test.     Follow-Up: At [facility], you and your health needs are our priority.  As part of our continuing mission to provide you with exceptional heart care, we have created designated Provider Care Teams.  These Care Teams include your primary Cardiologist (physician) and Advanced Practice Providers (APPs -  Physician Assistants and Nurse Practitioners) who all work together to provide you with the care you need, when you need it.   Your next appointment:   6 month(s)  Provider:   Armanda Magic, MD     Other Instructions   Please report to Radiology at the [facility] Main Entrance 30 minutes early for your test.  [address] [place], [place] [address]                         OR   Please report to Radiology at [facility] Main Entrance, medical mall, 30 mins prior to your test.  [address]  [place],  [place]  How to Prepare for Your Cardiac PET/CT Stress Test:  Nothing to eat or drink, except water, 3 hours prior to arrival time.  NO caffeine/decaffeinated products, or chocolate 12 hours prior to arrival. (Please note decaffeinated beverages (teas/coffees) still contain caffeine).  If you have caffeine within 12 hours prior, the test will need to be rescheduled.  Medication instructions: Do not take erectile dysfunction medications for 72 hours prior to test (sildenafil, tadalafil) Do not take nitrates (isosorbide mononitrate, Ranexa) the day before or day of test Do not take tamsulosin the day before or morning of test Hold theophylline containing medications for 12 hours. Hold Dipyridamole 48 hours prior to the test.  Diabetic Preparation: If able to eat breakfast prior to 3 hour fasting, you may take all medications, including your insulin. Do not worry if you miss your breakfast dose of insulin - start at your next meal. If you do not eat prior to 3 hour fast-Hold all diabetes (oral and insulin) medications. Patients who wear a continuous glucose monitor MUST remove the device prior to scanning.  You may take your remaining medications with water.  NO perfume, cologne or lotion on chest or abdomen area. FEMALES - Please avoid wearing dresses to this appointment.  Total time is 1 to 2 hours; you may want to bring reading material for the  waiting time.   In preparation for your appointment, medication and supplies will be purchased.  Appointment availability is limited, so if you need to cancel or reschedule, please call the Radiology Department Scheduler at [phone] 24 hours in advance to avoid a cancellation fee of $100.00  What to Expect When you Arrive:  Once you arrive and check in for your appointment, you will be taken to a preparation room within the Radiology Department.  A technologist or Nurse will obtain your medical history, verify that you are correctly prepped for  the exam, and explain the procedure.  Afterwards, an IV will be started in your arm and electrodes will be placed on your skin for EKG monitoring during the stress portion of the exam. Then you will be escorted to the PET/CT scanner.  There, staff will get you positioned on the scanner and obtain a blood pressure and EKG.  During the exam, you will continue to be connected to the EKG and blood pressure machines.  A small, safe amount of a radioactive tracer will be injected in your IV to obtain a series of pictures of your heart along with an injection of a stress agent.    After your Exam:  It is recommended that you eat a meal and drink a caffeinated beverage to counter act any effects of the stress agent.  Drink plenty of fluids for the remainder of the day and urinate frequently for the first couple of hours after the exam.  Your doctor will inform you of your test results within 7-10 business days.  For more information and frequently asked questions, please visit our website: [email or web address]  For questions about your test or how to prepare for your test, please call: Cardiac Imaging Nurse Navigators Office: [phone]

## 2023-06-10 NOTE — Addendum Note (Signed)
 Addended by: Macie Burows on: 06/10/2023 02:55 PM   Modules accepted: Orders

## 2023-06-11 DIAGNOSIS — M5416 Radiculopathy, lumbar region: Secondary | ICD-10-CM | POA: Diagnosis not present

## 2023-06-11 LAB — PRO B NATRIURETIC PEPTIDE: NT-Pro BNP: 392 pg/mL (ref 0–738)

## 2023-06-12 DIAGNOSIS — G40109 Localization-related (focal) (partial) symptomatic epilepsy and epileptic syndromes with simple partial seizures, not intractable, without status epilepticus: Secondary | ICD-10-CM | POA: Diagnosis not present

## 2023-06-12 DIAGNOSIS — I2510 Atherosclerotic heart disease of native coronary artery without angina pectoris: Secondary | ICD-10-CM | POA: Diagnosis not present

## 2023-06-12 DIAGNOSIS — E069 Thyroiditis, unspecified: Secondary | ICD-10-CM | POA: Diagnosis not present

## 2023-06-12 DIAGNOSIS — M4800 Spinal stenosis, site unspecified: Secondary | ICD-10-CM | POA: Diagnosis not present

## 2023-06-12 DIAGNOSIS — I110 Hypertensive heart disease with heart failure: Secondary | ICD-10-CM | POA: Diagnosis not present

## 2023-06-12 DIAGNOSIS — I5032 Chronic diastolic (congestive) heart failure: Secondary | ICD-10-CM | POA: Diagnosis not present

## 2023-06-12 DIAGNOSIS — K219 Gastro-esophageal reflux disease without esophagitis: Secondary | ICD-10-CM | POA: Diagnosis not present

## 2023-06-12 DIAGNOSIS — I429 Cardiomyopathy, unspecified: Secondary | ICD-10-CM | POA: Diagnosis not present

## 2023-06-12 DIAGNOSIS — E119 Type 2 diabetes mellitus without complications: Secondary | ICD-10-CM | POA: Diagnosis not present

## 2023-06-18 DIAGNOSIS — E119 Type 2 diabetes mellitus without complications: Secondary | ICD-10-CM | POA: Diagnosis not present

## 2023-06-18 DIAGNOSIS — G40109 Localization-related (focal) (partial) symptomatic epilepsy and epileptic syndromes with simple partial seizures, not intractable, without status epilepticus: Secondary | ICD-10-CM | POA: Diagnosis not present

## 2023-06-18 DIAGNOSIS — K219 Gastro-esophageal reflux disease without esophagitis: Secondary | ICD-10-CM | POA: Diagnosis not present

## 2023-06-18 DIAGNOSIS — M4800 Spinal stenosis, site unspecified: Secondary | ICD-10-CM | POA: Diagnosis not present

## 2023-06-18 DIAGNOSIS — I429 Cardiomyopathy, unspecified: Secondary | ICD-10-CM | POA: Diagnosis not present

## 2023-06-18 DIAGNOSIS — I5032 Chronic diastolic (congestive) heart failure: Secondary | ICD-10-CM | POA: Diagnosis not present

## 2023-06-18 DIAGNOSIS — I2510 Atherosclerotic heart disease of native coronary artery without angina pectoris: Secondary | ICD-10-CM | POA: Diagnosis not present

## 2023-06-18 DIAGNOSIS — E069 Thyroiditis, unspecified: Secondary | ICD-10-CM | POA: Diagnosis not present

## 2023-06-18 DIAGNOSIS — I110 Hypertensive heart disease with heart failure: Secondary | ICD-10-CM | POA: Diagnosis not present

## 2023-06-23 DIAGNOSIS — I429 Cardiomyopathy, unspecified: Secondary | ICD-10-CM | POA: Diagnosis not present

## 2023-06-23 DIAGNOSIS — M4800 Spinal stenosis, site unspecified: Secondary | ICD-10-CM | POA: Diagnosis not present

## 2023-06-23 DIAGNOSIS — G40109 Localization-related (focal) (partial) symptomatic epilepsy and epileptic syndromes with simple partial seizures, not intractable, without status epilepticus: Secondary | ICD-10-CM | POA: Diagnosis not present

## 2023-06-23 DIAGNOSIS — I2510 Atherosclerotic heart disease of native coronary artery without angina pectoris: Secondary | ICD-10-CM | POA: Diagnosis not present

## 2023-06-23 DIAGNOSIS — E119 Type 2 diabetes mellitus without complications: Secondary | ICD-10-CM | POA: Diagnosis not present

## 2023-06-23 DIAGNOSIS — E069 Thyroiditis, unspecified: Secondary | ICD-10-CM | POA: Diagnosis not present

## 2023-06-23 DIAGNOSIS — I5032 Chronic diastolic (congestive) heart failure: Secondary | ICD-10-CM | POA: Diagnosis not present

## 2023-06-23 DIAGNOSIS — K219 Gastro-esophageal reflux disease without esophagitis: Secondary | ICD-10-CM | POA: Diagnosis not present

## 2023-06-23 DIAGNOSIS — I110 Hypertensive heart disease with heart failure: Secondary | ICD-10-CM | POA: Diagnosis not present

## 2023-06-29 DIAGNOSIS — M5416 Radiculopathy, lumbar region: Secondary | ICD-10-CM | POA: Diagnosis not present

## 2023-06-29 DIAGNOSIS — M48062 Spinal stenosis, lumbar region with neurogenic claudication: Secondary | ICD-10-CM | POA: Diagnosis not present

## 2023-06-30 ENCOUNTER — Ambulatory Visit (HOSPITAL_COMMUNITY)
Admission: RE | Admit: 2023-06-30 | Discharge: 2023-06-30 | Disposition: A | Discharge status: Home / Self Care | Source: Ambulatory Visit | Attending: Cardiology | Admitting: Cardiology

## 2023-06-30 DIAGNOSIS — I5042 Chronic combined systolic (congestive) and diastolic (congestive) heart failure: Secondary | ICD-10-CM

## 2023-06-30 DIAGNOSIS — I351 Nonrheumatic aortic (valve) insufficiency: Secondary | ICD-10-CM | POA: Diagnosis not present

## 2023-06-30 DIAGNOSIS — I6523 Occlusion and stenosis of bilateral carotid arteries: Secondary | ICD-10-CM

## 2023-06-30 DIAGNOSIS — I2510 Atherosclerotic heart disease of native coronary artery without angina pectoris: Secondary | ICD-10-CM | POA: Diagnosis not present

## 2023-06-30 DIAGNOSIS — I10 Essential (primary) hypertension: Secondary | ICD-10-CM | POA: Diagnosis not present

## 2023-06-30 DIAGNOSIS — E785 Hyperlipidemia, unspecified: Secondary | ICD-10-CM | POA: Diagnosis not present

## 2023-07-01 ENCOUNTER — Encounter: Payer: Self-pay | Admitting: Cardiology

## 2023-07-01 ENCOUNTER — Telehealth: Payer: Self-pay

## 2023-07-01 DIAGNOSIS — I110 Hypertensive heart disease with heart failure: Secondary | ICD-10-CM | POA: Diagnosis not present

## 2023-07-01 DIAGNOSIS — E069 Thyroiditis, unspecified: Secondary | ICD-10-CM | POA: Diagnosis not present

## 2023-07-01 DIAGNOSIS — I6523 Occlusion and stenosis of bilateral carotid arteries: Secondary | ICD-10-CM

## 2023-07-01 DIAGNOSIS — I2510 Atherosclerotic heart disease of native coronary artery without angina pectoris: Secondary | ICD-10-CM | POA: Diagnosis not present

## 2023-07-01 DIAGNOSIS — I5032 Chronic diastolic (congestive) heart failure: Secondary | ICD-10-CM | POA: Diagnosis not present

## 2023-07-01 DIAGNOSIS — M4800 Spinal stenosis, site unspecified: Secondary | ICD-10-CM | POA: Diagnosis not present

## 2023-07-01 DIAGNOSIS — E119 Type 2 diabetes mellitus without complications: Secondary | ICD-10-CM | POA: Diagnosis not present

## 2023-07-01 DIAGNOSIS — K219 Gastro-esophageal reflux disease without esophagitis: Secondary | ICD-10-CM | POA: Diagnosis not present

## 2023-07-01 DIAGNOSIS — I429 Cardiomyopathy, unspecified: Secondary | ICD-10-CM | POA: Diagnosis not present

## 2023-07-01 DIAGNOSIS — G40109 Localization-related (focal) (partial) symptomatic epilepsy and epileptic syndromes with simple partial seizures, not intractable, without status epilepticus: Secondary | ICD-10-CM | POA: Diagnosis not present

## 2023-07-01 NOTE — Telephone Encounter (Signed)
 The patient has been notified of the result and verbalized understanding.  All questions (if any) were answered. Repeat dopplers were ordered for 1 year from now. Pt aware. Erick Alley, RN 07/01/2023 3:11 PM

## 2023-07-01 NOTE — Telephone Encounter (Signed)
-----   Message from Nurse Corky Crafts sent at 07/01/2023 10:24 AM EDT -----  ----- Message ----- From: Quintella Reichert, MD Sent: 07/01/2023  10:03 AM EDT To: [facility] Triage  1-39% bilateral carotid stenosis >> repeat dopplers in 1 year

## 2023-07-02 ENCOUNTER — Telehealth: Payer: Self-pay | Admitting: Neurology

## 2023-07-02 NOTE — Telephone Encounter (Signed)
 LVM and sent MyChart msg informing pt of r/s needed for 8/18 appt- MD out.

## 2023-07-08 DIAGNOSIS — G40109 Localization-related (focal) (partial) symptomatic epilepsy and epileptic syndromes with simple partial seizures, not intractable, without status epilepticus: Secondary | ICD-10-CM | POA: Diagnosis not present

## 2023-07-08 DIAGNOSIS — K219 Gastro-esophageal reflux disease without esophagitis: Secondary | ICD-10-CM | POA: Diagnosis not present

## 2023-07-08 DIAGNOSIS — E119 Type 2 diabetes mellitus without complications: Secondary | ICD-10-CM | POA: Diagnosis not present

## 2023-07-08 DIAGNOSIS — E069 Thyroiditis, unspecified: Secondary | ICD-10-CM | POA: Diagnosis not present

## 2023-07-08 DIAGNOSIS — I110 Hypertensive heart disease with heart failure: Secondary | ICD-10-CM | POA: Diagnosis not present

## 2023-07-08 DIAGNOSIS — I5032 Chronic diastolic (congestive) heart failure: Secondary | ICD-10-CM | POA: Diagnosis not present

## 2023-07-08 DIAGNOSIS — I2510 Atherosclerotic heart disease of native coronary artery without angina pectoris: Secondary | ICD-10-CM | POA: Diagnosis not present

## 2023-07-08 DIAGNOSIS — M4800 Spinal stenosis, site unspecified: Secondary | ICD-10-CM | POA: Diagnosis not present

## 2023-07-08 DIAGNOSIS — I429 Cardiomyopathy, unspecified: Secondary | ICD-10-CM | POA: Diagnosis not present

## 2023-07-17 ENCOUNTER — Other Ambulatory Visit: Payer: Self-pay | Admitting: Cardiology

## 2023-07-25 ENCOUNTER — Other Ambulatory Visit: Payer: Self-pay | Admitting: Cardiology

## 2023-08-10 ENCOUNTER — Encounter (HOSPITAL_COMMUNITY): Payer: Self-pay

## 2023-08-11 ENCOUNTER — Telehealth (HOSPITAL_COMMUNITY): Payer: Self-pay | Admitting: Emergency Medicine

## 2023-08-11 NOTE — Telephone Encounter (Signed)
Attempted to call patient regarding upcoming cardiac PET appointment. Left message on voicemail with name and callback number Aidan Moten RN Navigator Cardiac Imaging [facility] [phone] Office [phone] Cell  

## 2023-08-11 NOTE — Telephone Encounter (Signed)
 Reaching out to patient to offer assistance regarding upcoming cardiac imaging study; pt verbalizes understanding of appt date/time, parking situation and where to check in, pre-test NPO status and medications ordered, and verified current allergies; name and call back number provided for further questions should they arise Rockwell Alexandria RN Navigator Cardiac Imaging [facility] [phone] office [phone] cell

## 2023-08-12 ENCOUNTER — Encounter (HOSPITAL_COMMUNITY)
Admission: RE | Admit: 2023-08-12 | Discharge: 2023-08-12 | Disposition: A | Discharge status: Home / Self Care | Source: Ambulatory Visit | Attending: Cardiology | Admitting: Cardiology

## 2023-08-12 DIAGNOSIS — I6523 Occlusion and stenosis of bilateral carotid arteries: Secondary | ICD-10-CM | POA: Diagnosis not present

## 2023-08-12 DIAGNOSIS — I2510 Atherosclerotic heart disease of native coronary artery without angina pectoris: Secondary | ICD-10-CM | POA: Diagnosis not present

## 2023-08-12 DIAGNOSIS — I5042 Chronic combined systolic (congestive) and diastolic (congestive) heart failure: Secondary | ICD-10-CM | POA: Diagnosis not present

## 2023-08-12 DIAGNOSIS — I351 Nonrheumatic aortic (valve) insufficiency: Secondary | ICD-10-CM | POA: Insufficient documentation

## 2023-08-12 DIAGNOSIS — I10 Essential (primary) hypertension: Secondary | ICD-10-CM | POA: Diagnosis not present

## 2023-08-12 DIAGNOSIS — E785 Hyperlipidemia, unspecified: Secondary | ICD-10-CM | POA: Diagnosis not present

## 2023-08-12 LAB — NM PET CT CARDIAC PERFUSION MULTI W/ABSOLUTE BLOODFLOW
MBFR: 4.49
Nuc Rest EF: 35 %
Nuc Stress EF: 35 %
Rest MBF: 0.8 ml/g/min
Rest Nuclear Isotope Dose: 18.6 mCi
ST Depression (mm): 0 mm
Stress MBF: 3.59 ml/g/min
Stress Nuclear Isotope Dose: 18.6 mCi
TID: 1.02

## 2023-08-12 MED ORDER — RUBIDIUM RB82 GENERATOR (RUBYFILL)
18.5700 | PACK | Freq: Once | INTRAVENOUS | Status: AC
Start: 2023-08-12 — End: 2023-08-12
  Administered 2023-08-12: 18.57 via INTRAVENOUS

## 2023-08-12 MED ORDER — REGADENOSON 0.4 MG/5ML IV SOLN
0.4000 mg | Freq: Once | INTRAVENOUS | Status: AC
  Administered 2023-08-12: 0.4 mg via INTRAVENOUS

## 2023-08-12 MED ORDER — REGADENOSON 0.4 MG/5ML IV SOLN
INTRAVENOUS | Status: AC
Start: 2023-08-12 — End: ?
  Filled 2023-08-12: qty 5

## 2023-08-13 ENCOUNTER — Encounter: Payer: Self-pay | Admitting: Cardiology

## 2023-08-18 ENCOUNTER — Ambulatory Visit: Payer: Self-pay

## 2023-08-18 ENCOUNTER — Telehealth: Payer: Self-pay

## 2023-08-18 DIAGNOSIS — I5032 Chronic diastolic (congestive) heart failure: Secondary | ICD-10-CM

## 2023-08-18 DIAGNOSIS — Z01812 Encounter for preprocedural laboratory examination: Secondary | ICD-10-CM

## 2023-08-18 MED ORDER — ENTRESTO 24-26 MG PO TABS: 1.0000 | ORAL_TABLET | Freq: Two times a day (BID) | ORAL | 3 refills | Status: DC

## 2023-08-18 NOTE — Telephone Encounter (Signed)
 Call to patient to discuss Stress test results which showed normal blood flow but reduced LVF down to 35%.  LVM and Explained that Dr. Micael Adas would like her to start Entresto 24-26mg  BID and continue current dose of Carvedilol . Also advised that she would be referred to  PharmD CHF clinic in 2 weeks for addition of Spiro and SGLT2 if BP and renal function stable. Dr. Micael Adas would also like to see patient again in 4 weeks so I scheduled patient for an appt 09/23/23 at 11 AM and would also like to order a cardiac MRI to assess LV function.  Sent Mychart message advising of treatment plan.

## 2023-08-18 NOTE — Telephone Encounter (Signed)
-----   Message from Gaylyn Keas sent at 08/13/2023  7:13 PM EDT ----- Stress test showed normal blood flow but reduced LVF down to 35%. Please start Entresto 24-26mg  BID and continue current dose of Carvedilol . Please have her followup in PharmD CHF clinic in 2 weeks for addition of Spiro and SGLT2 if BP and renal function stable. Followup with me in 4 weeks. Please get a cardiac MRI for further assessment of LV dysfunction

## 2023-08-18 NOTE — Telephone Encounter (Signed)
 Patient calling to review treatment plan, reviewed orders for [name] [name] D appt, labs, appt w/ Dr. Micael Adas and cardiac MRI. Patient verbalized understanding.

## 2023-08-19 ENCOUNTER — Encounter (HOSPITAL_COMMUNITY): Payer: Self-pay

## 2023-08-20 ENCOUNTER — Other Ambulatory Visit: Payer: Self-pay

## 2023-08-20 DIAGNOSIS — Z01812 Encounter for preprocedural laboratory examination: Secondary | ICD-10-CM

## 2023-08-20 DIAGNOSIS — I5032 Chronic diastolic (congestive) heart failure: Secondary | ICD-10-CM

## 2023-08-20 DIAGNOSIS — Z01810 Encounter for preprocedural cardiovascular examination: Secondary | ICD-10-CM | POA: Diagnosis not present

## 2023-08-21 ENCOUNTER — Ambulatory Visit: Payer: Self-pay | Admitting: Cardiology

## 2023-08-21 ENCOUNTER — Other Ambulatory Visit: Payer: Self-pay | Admitting: Cardiology

## 2023-08-21 ENCOUNTER — Ambulatory Visit (HOSPITAL_COMMUNITY)
Admission: RE | Admit: 2023-08-21 | Discharge: 2023-08-21 | Disposition: A | Discharge status: Home / Self Care | Source: Ambulatory Visit | Attending: Cardiology | Admitting: Cardiology

## 2023-08-21 DIAGNOSIS — I5032 Chronic diastolic (congestive) heart failure: Secondary | ICD-10-CM

## 2023-08-21 DIAGNOSIS — Z79899 Other long term (current) drug therapy: Secondary | ICD-10-CM

## 2023-08-21 LAB — CBC
Hematocrit: 35.8 % (ref 34.0–46.6)
Hemoglobin: 11.8 g/dL (ref 11.1–15.9)
MCH: 31.6 pg (ref 26.6–33.0)
MCHC: 33 g/dL (ref 31.5–35.7)
MCV: 96 fL (ref 79–97)
Platelets: 180 10*3/uL (ref 150–450)
RBC: 3.73 x10E6/uL — ABNORMAL LOW (ref 3.77–5.28)
RDW: 13.6 % (ref 11.7–15.4)
WBC: 9 10*3/uL (ref 3.4–10.8)

## 2023-08-21 MED ORDER — GADOBUTROL 1 MMOL/ML IV SOLN
10.0000 mL | Freq: Once | INTRAVENOUS | Status: AC | PRN
  Administered 2023-08-21: 10 mL via INTRAVENOUS

## 2023-08-25 NOTE — Telephone Encounter (Signed)
 Call to patient to discuss Cardiac MRI results, which showed reduced LVF with EF 30% and normal RV function. Advised that Dr. Micael Adas saw no evidence of prior MI, infiltrative disease or inflammation of the heart muscle.   Patient provided the following HR/BP readings:  08/11/23: HR 74 BP 155/82  08/13/23: HR 73 BP 147/74  08/20/23: HR 76 BP 137/79  08/24/23: HR 79 BP 137/83  Patient responses forwarded to Dr. Micael Adas.

## 2023-08-25 NOTE — Telephone Encounter (Signed)
-----   Message from Gaylyn Keas sent at 08/24/2023  8:51 AM EDT ----- Cardiac MRI showed reduced LVF with EF 30%, normal RV function.  No evidence of prior MI, infiltrative disease or inflammation of the heart muscle.  Please find out what patient's BP and HR have been runnin

## 2023-09-03 MED ORDER — ENTRESTO 49-51 MG PO TABS: 1.0000 | ORAL_TABLET | Freq: Two times a day (BID) | ORAL | 3 refills | Status: DC

## 2023-09-03 NOTE — Telephone Encounter (Signed)
-----   Message from Gaylyn Keas sent at 08/25/2023 10:46 PM EDT ----- Increase Entresto  to 49-51mg  BID and check BMET in 1 week and followup with me in 4 weeks ----- Message ----- From: Cherylyn Cos, RN Sent: 08/25/2023   5:54 PM EDT To: Jacqueline Matsu, MD  ----- Message from Cherylyn Cos, RN sent at 08/25/2023  5:54 PM EDT ----- Patient states she is noticing she is feeling better since she started the entresto  last week.

## 2023-09-03 NOTE — Telephone Encounter (Signed)
 Call to patient advising that Dr. Micael Adas wants to Increase Entresto  to 49-51mg  BID and check BMET in 1 week. Patient verbalizes understanding and agrees to plan. Orderes placed. 4 week f/u already scheduled.

## 2023-09-03 NOTE — Addendum Note (Signed)
 Addended by: Cherylyn Cos on: 09/03/2023 01:32 PM   Modules accepted: Orders

## 2023-09-07 ENCOUNTER — Telehealth: Payer: Self-pay | Admitting: Cardiology

## 2023-09-07 NOTE — Telephone Encounter (Signed)
 Patient states she still has Entresto  24-26 mg left and would like to know if OK to take 2 tablets BID to finish up before starting new dose.  Informed patient she can take 2 tablets of Entresto  24-26 mg BID to finish up prescription before taking new increased dose. Patient verbalized understanding and expressed appreciation for follow-up.

## 2023-09-07 NOTE — Telephone Encounter (Signed)
 Pt c/o medication issue:  1. Name of Medication:   sacubitril-valsartan (ENTRESTO ) 49-51 MG    2. How are you currently taking this medication (dosage and times per day)?  Take 1 tablet by mouth 2 (two) times daily.     3. Are you having a reaction (difficulty breathing--STAT)? No  4. What is your medication issue? Pt is requesting a callback from nurse Ardelia Beau regarding her wanting to know can she double up on the 2 bottles she had left to equal out the new dosage before starting the new bottle she picked up. Please advise

## 2023-09-08 ENCOUNTER — Ambulatory Visit (HOSPITAL_BASED_OUTPATIENT_CLINIC_OR_DEPARTMENT_OTHER): Admitting: Pharmacist Clinician (PhC)/ Clinical Pharmacy Specialist

## 2023-09-08 ENCOUNTER — Encounter (HOSPITAL_BASED_OUTPATIENT_CLINIC_OR_DEPARTMENT_OTHER): Payer: Self-pay

## 2023-09-08 VITALS — BP 124/76 | HR 86 | Ht 61.0 in | Wt 159.3 lb

## 2023-09-08 DIAGNOSIS — I5032 Chronic diastolic (congestive) heart failure: Secondary | ICD-10-CM

## 2023-09-08 NOTE — Assessment & Plan Note (Signed)
 Assessment: BP in office today is 124/76 Home readings still higher, but has only been on the 49/51 for 3 days now.  Patient with HFrEF (30%) Tolerates current medications without any side effects Denies SOB, palpitation, chest pain, headaches,or edema Some concern for cost of Entresto .  Bought 90 day supply of 24/26, then dose increased and purchased another 90 day supply of 49/51.  About $80 each time. Reiterated the importance of regular exercise and low salt diet  Plan: GDMT ACEI/ARB/ARNI Entresto  49/51   Beta blocker Carvedilol  6.25   MRA  Not started, consider if BP allows at f/u  SGLT2  Did not tolerate Jardiance - yeast infection   Labs orderd:  none today, she should get BMET day or two prior to Dr. Micael Adas appt in June Follow up with Dr. Micael Adas in June

## 2023-09-08 NOTE — Progress Notes (Signed)
 Office Visit    Patient Name: [name] [name] Date of Encounter: 09/08/2023  Primary Care Provider:  Tena Feeling, MD Primary Cardiologist:  Gaylyn Keas, MD  Chief Complaint    Heart Failure Medication Titration - EF 30% (by cardiac MRI)  Significant Past Medical History   HLD 12/24 LDL 33 on atorvastatin  10  DM2 12/24 A1c 7.1 on glimepiride   Carotid stenosis 1-39% bilateral per dopplers 06/2023  CAD CABG x 1 LIMA to LAD    Allergies  Allergen Reactions   Atenolol Shortness Of Breath and Other (See Comments)    Edema and Headache   Lisinopril  Cough   Lorazepam  Other (See Comments)    Over sedation. Family does not want lorazepam .    Penicillin G Other (See Comments)    Other Reaction(s): Not available   Penicillins Other (See Comments) and Rash    Has patient had a PCN reaction causing immediate rash, facial/tongue/throat swelling, SOB or lightheadedness with hypotension: No  Has patient had a PCN reaction causing severe rash involving mucus membranes or skin necrosis: No  Has patient had a PCN reaction that required hospitalization No  Has patient had a PCN reaction occurring within the last 10 years: No  If all of the above answers are "NO", then may proceed with Cephalosporin use.G Benzathine: Local injection rash  Other Reaction(s): Not available   Sulfa Antibiotics Hives and Rash    History of Present Illness    [name] [name] is a 78 y.o. female patient of Dr Micael Adas, in the office today for heart failure medication management.  She was seen in March for routine follow up and had a stress PET CT as well as carotid dopplers.  While the dopplers showed 1-39% stenosis, the CT showed EF down to 35%.  She was notified by phone and started Entresto  24-26 on May 8.  A follow up MRI showed EF at 30% and her home BP reading were 137-155 systolic, so on Friday, the Entresto  was increased to 49/51 mg twice daily.  She is in the office today for follow up.   She has only  been on the increased dose of Entresto  for 3 days, and home BP readings have not had a chance to decrease significantly.  We reviewed the diagnosis of heart failure and the importance of GDMT.  She is currently on Entresto  and carvedilol .  Previously was prescribed Jardiance for her DM2, but this caused yeast infections, so was discontinued.  She has not taken an MRA to date.    Blood Pressure Goal:  130/80  GDMT: ACEI/ARB/ARNI [x] Yes [] No Entresto  49/51  Beta blocker [x] Yes [] No Carvedilol  6.25  MRA [] Yes [x] No   SGLT2 inhibitor [] Yes [x] No Jardiance led to yeast infection   Social Hx:      Tobacco: no  Alcohol : no   Exercise: walking a little, husband tries to encourage movement   Home BP readings:   has a few readings with her: 142/74, 140/91, 137/92, 119/72, 121/94  Accessory Clinical Findings    Lab Results  Component Value Date   CREATININE 0.80 05/12/2023   BUN 13 05/12/2023   NA 139 05/12/2023   K 4.3 05/12/2023   CL 100 05/12/2023   CO2 20 05/12/2023   Lab Results  Component Value Date   ALT 13 03/15/2023   AST 21 03/15/2023   ALKPHOS 46 03/15/2023   BILITOT 0.8 03/15/2023   Lab Results  Component Value Date   HGBA1C 7.1 (H) 03/16/2023  Home Medications/Allergies    Current Outpatient Medications  Medication Sig Dispense Refill   aspirin  81 MG chewable tablet Chew 1 tablet (81 mg total) by mouth 2 (two) times daily. 60 tablet 1   atorvastatin  (LIPITOR) 10 MG tablet TAKE 1 TABLET BY MOUTH EVERY DAY 90 tablet 3   Calcium  Carb-Cholecalciferol (CALCIUM  600-D PO) Take 1 tablet by mouth daily.     carvedilol  (COREG ) 6.25 MG tablet Take 1 tablet (6.25 mg total) by mouth 2 (two) times daily with a meal. 180 tablet 3   clindamycin  (CLEOCIN ) 150 MG capsule Take 4 capsules by mouth as needed. (Take one (1) hour prior to dental procedures)  2   cyanocobalamin  1000 MCG tablet Take 1 tablet (1,000 mcg total) by mouth daily.     furosemide  (LASIX ) 20 MG tablet Take  1 tablet (20 mg total) by mouth daily as needed (wt gain 3 lbs in 24 hrs or 5lbs in 1 week). 30 tablet 11   glimepiride  (AMARYL ) 2 MG tablet Take 4 mg by mouth daily with breakfast.     GLUCOSAMINE-CHONDROITIN PO Take 1 tablet by mouth daily. Glucosamine 1200 mg, chondroitin 1500 mg     lacosamide  (VIMPAT ) 50 MG TABS tablet Take 1 tablet (50 mg total) by mouth 2 (two) times daily. 60 tablet 5   metFORMIN  (GLUCOPHAGE -XR) 500 MG 24 hr tablet Take 1,000 mg by mouth in the morning and at bedtime.     Multiple Vitamin (MULTIVITAMIN WITH MINERALS) TABS tablet Take 1 tablet by mouth daily. Centrum Silver     omeprazole (PRILOSEC) 40 MG capsule Take 40 mg by mouth daily.     ONE TOUCH ULTRA TEST test strip      pramipexole  (MIRAPEX ) 1.5 MG tablet Take 1.5 mg by mouth at bedtime.     sacubitril-valsartan (ENTRESTO ) 49-51 MG Take 1 tablet by mouth 2 (two) times daily. 180 tablet 3   thiamine  (VITAMIN B-1) 100 MG tablet Take 1 tablet (100 mg total) by mouth daily.     Apoaequorin (PREVAGEN) 10 MG CAPS Take 1 tablet by mouth daily. (Patient not taking: Reported on 09/08/2023)     No current facility-administered medications for this visit.     Allergies  Allergen Reactions   Atenolol Shortness Of Breath and Other (See Comments)    Edema and Headache   Lisinopril  Cough   Lorazepam  Other (See Comments)    Over sedation. Family does not want lorazepam .    Penicillin G Other (See Comments)    Other Reaction(s): Not available   Penicillins Other (See Comments) and Rash    Has patient had a PCN reaction causing immediate rash, facial/tongue/throat swelling, SOB or lightheadedness with hypotension: No  Has patient had a PCN reaction causing severe rash involving mucus membranes or skin necrosis: No  Has patient had a PCN reaction that required hospitalization No  Has patient had a PCN reaction occurring within the last 10 years: No  If all of the above answers are "NO", then may proceed with  Cephalosporin use.G Benzathine: Local injection rash  Other Reaction(s): Not available   Sulfa Antibiotics Hives and Rash       Assessment & Plan      Chronic diastolic CHF (congestive heart failure), NYHA class 2 (HCC) Assessment: BP in office today is 124/76 Home readings still higher, but has only been on the 49/51 for 3 days now.  Patient with HFrEF (30%) Tolerates current medications without any side effects Denies SOB, palpitation, chest pain, headaches,or  edema Some concern for cost of Entresto .  Bought 90 day supply of 24/26, then dose increased and purchased another 90 day supply of 49/51.  About $80 each time. Reiterated the importance of regular exercise and low salt diet  Plan: GDMT ACEI/ARB/ARNI Entresto  49/51   Beta blocker Carvedilol  6.25   MRA  Not started, consider if BP allows at f/u  SGLT2  Did not tolerate Jardiance - yeast infection   Labs orderd:  none today, she should get BMET day or two prior to Dr. Micael Adas appt in June Follow up with Dr. Micael Adas in June   Donivan Furry PharmD CPP [facility]  [address] [place], [place] [address] [phone]

## 2023-09-08 NOTE — Patient Instructions (Signed)
 Follow up appointment: June 18 with Dr. Micael Adas at the [facility] office  Go to the lab a few days prior to seeing Dr. Micael Adas  Take your  meds as follows:  Continue the Entresto  49/51 mg twice daily and carvedilol  12.5 mg twice daily.   Check your blood pressure at home daily (if able) and keep record of the readings.   To check your pressure at home you will need to:  1. Sit up in a chair, with feet flat on the floor and back supported. Do not cross your ankles or legs. 2. Rest your left arm so that the cuff is about heart level. If the cuff goes on your upper arm,  then just relax the arm on the table, arm of the chair or your lap. If you have a wrist cuff, we  suggest relaxing your wrist against your chest (think of it as Pledging the Flag with the  wrong arm).  3. Place the cuff snugly around your arm, about 1 inch above the crook of your elbow. The  cords should be inside the groove of your elbow.  4. Sit quietly, with the cuff in place, for about 5 minutes. After that 5 minutes press the power  button to start a reading. 5. Do not talk or move while the reading is taking place.  6. Record your readings on a sheet of paper. Although most cuffs have a memory, it is often  easier to see a pattern developing when the numbers are all in front of you.  7. You can repeat the reading after 1-3 minutes if it is recommended  Make sure your bladder is empty and you have not had caffeine or tobacco within the last 30 min  Always bring your blood pressure log with you to your appointments. If you have not brought your monitor in to be double checked for accuracy, please bring it to your next appointment.  You can find a list of quality blood pressure cuffs at [email or web address]  Important lifestyle changes to control high blood pressure  Intervention  Effect on the BP  Lose extra pounds and watch your waistline Weight loss is one of the most effective lifestyle changes for controlling  blood pressure. If you're overweight or obese, losing even a small amount of weight can help reduce blood pressure. Blood pressure might go down by about 1 millimeter of mercury (mm Hg) with each kilogram (about 2.2 pounds) of weight lost.  Exercise regularly As a general goal, aim for at least 30 minutes of moderate physical activity every day. Regular physical activity can lower high blood pressure by about 5 to 8 mm Hg.  Eat a healthy diet Eating a diet rich in whole grains, fruits, vegetables, and low-fat dairy products and low in saturated fat and cholesterol. A healthy diet can lower high blood pressure by up to 11 mm Hg.  Reduce salt (sodium) in your diet Even a small reduction of sodium in the diet can improve heart health and reduce high blood pressure by about 5 to 6 mm Hg.  Limit alcohol  One drink equals 12 ounces of beer, 5 ounces of wine, or 1.5 ounces of 80-proof liquor.  Limiting alcohol  to less than one drink a day for women or two drinks a day for men can help lower blood pressure by about 4 mm Hg.   If you have any questions or concerns please use My Chart to send questions or call the office at [phone]

## 2023-09-21 DIAGNOSIS — I5032 Chronic diastolic (congestive) heart failure: Secondary | ICD-10-CM | POA: Diagnosis not present

## 2023-09-21 DIAGNOSIS — Z79899 Other long term (current) drug therapy: Secondary | ICD-10-CM | POA: Diagnosis not present

## 2023-09-22 LAB — BASIC METABOLIC PANEL WITH GFR
BUN/Creatinine Ratio: 16 (ref 12–28)
BUN: 12 mg/dL (ref 8–27)
CO2: 19 mmol/L — ABNORMAL LOW (ref 20–29)
Calcium: 9.2 mg/dL (ref 8.7–10.3)
Chloride: 102 mmol/L (ref 96–106)
Creatinine, Ser: 0.74 mg/dL (ref 0.57–1.00)
Glucose: 219 mg/dL — ABNORMAL HIGH (ref 70–99)
Potassium: 4.8 mmol/L (ref 3.5–5.2)
Sodium: 137 mmol/L (ref 134–144)
eGFR: 82 mL/min/{1.73_m2} (ref >59)

## 2023-09-23 ENCOUNTER — Ambulatory Visit: Attending: Cardiology | Admitting: Cardiology

## 2023-09-23 VITALS — BP 106/67 | HR 68 | Ht 61.0 in | Wt 159.8 lb

## 2023-09-23 DIAGNOSIS — I425 Other restrictive cardiomyopathy: Secondary | ICD-10-CM | POA: Diagnosis not present

## 2023-09-23 DIAGNOSIS — I10 Essential (primary) hypertension: Secondary | ICD-10-CM

## 2023-09-23 DIAGNOSIS — I6523 Occlusion and stenosis of bilateral carotid arteries: Secondary | ICD-10-CM | POA: Diagnosis not present

## 2023-09-23 DIAGNOSIS — I351 Nonrheumatic aortic (valve) insufficiency: Secondary | ICD-10-CM | POA: Diagnosis not present

## 2023-09-23 DIAGNOSIS — I5042 Chronic combined systolic (congestive) and diastolic (congestive) heart failure: Secondary | ICD-10-CM | POA: Diagnosis not present

## 2023-09-23 DIAGNOSIS — I2510 Atherosclerotic heart disease of native coronary artery without angina pectoris: Secondary | ICD-10-CM

## 2023-09-23 DIAGNOSIS — Z79899 Other long term (current) drug therapy: Secondary | ICD-10-CM | POA: Diagnosis not present

## 2023-09-23 MED ORDER — SPIRONOLACTONE 25 MG PO TABS: 12.5000 mg | ORAL_TABLET | Freq: Every day | ORAL | 3 refills | Status: DC

## 2023-09-23 NOTE — Addendum Note (Signed)
 Addended by: Cherylyn Cos on: 09/23/2023 11:56 AM   Modules accepted: Orders

## 2023-09-23 NOTE — Progress Notes (Signed)
 9 Date:  09/23/2023   ID:  [name] [name], DOB [birth year], MRN [id number]   PCP:  Tena Feeling, MD  Cardiologist:  Gaylyn Keas, MD  Electrophysiologist:  None   Chief Complaint:  AI, HTN, DCM, CAD, lipids  History of Present Illness:     [name] [name] is a 78 y.o. female with a hx of PVC's, dilated aortic root, severe AR, HTN, severe LV dysfunction with EF 25% (now normalized), severe mid LAD stenosis s/p 1 vessel CABG (LIMA to LAD) and Bentall procedure with aortic root replacement with pericardial tissue AVR .      2D echo 03/27/2023 showed EF 40 to 45%.  When I last saw her she was having lower extremity edema and shortness of breath.  She was started on diuretics with some improvement in shortness of breath but not the lower extremity edema.  A stress PET CT 08/12/2023 showed normal myocardial perfusion with EF 35% with normal myocardial blood flow reserve.  She was started on Entresto  24-26 mg twice daily and continued on carvedilol .  CMR showed EF 30%, normal RV and no evidence of LGE.  Extracellular volume percentage was elevated suggestive of myocardial fibrotic content.  Seen by Pharm.D. who has been uptitrating Entresto  with last dosage 49-51 mg twice daily.  She is intolerant to SGLT2 due to history of yeast infections.  She is now here for further up titration of her GDMT.  She is here today for followup and is doing well.  She denies any chest pain or pressure, SOB, DOE, PND, orthopnea, dizziness, palpitations or syncope. Occasionally she sill have some mild LE edema but has only had to take Lasix  once since I saw her last.  SHe is compliant with her meds and is tolerating meds with no SE.     Prior CV studies:   The following studies were reviewed today:None   Past Medical History:  Diagnosis Date   Adenomatous colon polyp    Aortic regurgitation    severe by TEE s/p pericardia tissue AVR   Arthritis    Cardiomyopathy (HCC)    EF initally 25% but normalized>>repeat echo  5/20205 with EF 30-35%   Carotid stenosis 08/17/2017   1-39% bilateral carotid stenosis  dopplers 06/2023   Chronic diastolic CHF (congestive heart failure), NYHA class 2 (HCC)    Coronary artery disease 10/05/2013   Severe mid LAD stenosis s/p 1 vessel CABG with LIMA to LAD   Diabetes mellitus without complication (HCC)    DX 20 YRS AGO.   TYPE 2   Dilated aortic root (HCC)    GERD (gastroesophageal reflux disease)    Tums prn   Gout    Hypercholesteremia    LDL goal < 70   Hypertension    S/P Bio-Bentall aortic root replacement with bioprosthetic valve conduit 10/27/2013   23 mm [facility] Ease bovine pericardial tissue valve and 26 mm Vascutek gelweave Val-Salva aortic graft   S/P CABG x 1 10/27/2013   LIMA to LAD   Thyroiditis 10/2009   lab and u/s-thyroid  function normalized in 9/11   Past Surgical History:  Procedure Laterality Date   ABDOMINAL HYSTERECTOMY     AORTIC VALVE REPLACEMENT N/A 10/27/2013   Procedure: AORTIC VALVE REPLACEMENT (AVR);  Surgeon: Gardenia Jump, MD;  Location: [facility] OR;  Service: Open Heart Surgery;  Laterality: N/A;   ASCENDING AORTIC ROOT REPLACEMENT N/A 10/27/2013   Procedure: ASCENDING AORTIC ROOT REPLACEMENT;  Surgeon: Gardenia Jump, MD;  Location:  [facility] OR;  Service: Open Heart Surgery;  Laterality: N/A;   BUNIONECTOMY     bilateral   CARDIAC CATHETERIZATION     CARDIAC VALVE REPLACEMENT     AORTIC VALVE   CARPAL TUNNEL RELEASE Right    CATARACT EXTRACTION Bilateral    COLONOSCOPY     COLONOSCOPY WITH PROPOFOL  N/A 05/08/2015   Procedure: COLONOSCOPY WITH PROPOFOL ;  Surgeon: Garrett Kallman, MD;  Location: [facility] ENDOSCOPY;  Service: Endoscopy;  Laterality: N/A;   CORONARY ARTERY BYPASS GRAFT N/A 10/27/2013   Procedure: CORONARY ARTERY BYPASS GRAFTING (CABG) x 1 - LIMA to LAD;  Surgeon: Gardenia Jump, MD;  Location: [facility] OR;  Service: Open Heart Surgery;  Laterality: N/A;   EYE SURGERY     BILATERAL CATARACTS   INTRAOPERATIVE TRANSESOPHAGEAL  ECHOCARDIOGRAM N/A 10/27/2013   Procedure: INTRAOPERATIVE TRANSESOPHAGEAL ECHOCARDIOGRAM;  Surgeon: Gardenia Jump, MD;  Location: [facility] OR;  Service: Open Heart Surgery;  Laterality: N/A;   JOINT REPLACEMENT     left total knee   KNEE ARTHROPLASTY Right 01/26/2017   Procedure: RIGHT TOTAL KNEE ARTHROPLASTY WITH COMPUTER NAVIGATION;  Surgeon: Adonica Hoose, MD;  Location: [facility] OR;  Service: Orthopedics;  Laterality: Right;  Needs RNFA   KNEE ARTHROPLASTY Left 05/28/2017   Procedure: LEFT TOTAL KNEE ARTHROPLASTY WITH COMPUTER NAVIGATION;  Surgeon: Adonica Hoose, MD;  Location: [facility] ORS;  Service: Orthopedics;  Laterality: Left;  Needs RNFA   LEFT AND RIGHT HEART CATHETERIZATION WITH CORONARY ANGIOGRAM N/A 10/05/2013   Procedure: LEFT AND RIGHT HEART CATHETERIZATION WITH CORONARY ANGIOGRAM;  Surgeon: Arlander Bellman, MD;  Location: [facility] CATH LAB;  Service: Cardiovascular;  Laterality: N/A;   TEE WITHOUT CARDIOVERSION N/A 10/04/2013   Procedure: TRANSESOPHAGEAL ECHOCARDIOGRAM (TEE);  Surgeon: Jacqueline Matsu, MD;  Location: [facility] ENDOSCOPY;  Service: Cardiovascular;  Laterality: N/A;   tubular adenomatous polyp colonoscopy  12/31/2009     Current Meds  Medication Sig   Apoaequorin (PREVAGEN) 10 MG CAPS Take 1 tablet by mouth daily.   aspirin  81 MG chewable tablet Chew 1 tablet (81 mg total) by mouth 2 (two) times daily.   atorvastatin  (LIPITOR) 10 MG tablet TAKE 1 TABLET BY MOUTH EVERY DAY   Calcium  Carb-Cholecalciferol (CALCIUM  600-D PO) Take 1 tablet by mouth daily.   carvedilol  (COREG ) 6.25 MG tablet Take 1 tablet (6.25 mg total) by mouth 2 (two) times daily with a meal.   cyanocobalamin  1000 MCG tablet Take 1 tablet (1,000 mcg total) by mouth daily.   furosemide  (LASIX ) 20 MG tablet Take 1 tablet (20 mg total) by mouth daily as needed (wt gain 3 lbs in 24 hrs or 5lbs in 1 week).   glimepiride  (AMARYL ) 2 MG tablet Take 4 mg by mouth daily with breakfast.   GLUCOSAMINE-CHONDROITIN PO Take 1 tablet by  mouth daily. Glucosamine 1200 mg, chondroitin 1500 mg   lacosamide  (VIMPAT ) 50 MG TABS tablet Take 1 tablet (50 mg total) by mouth 2 (two) times daily.   metFORMIN  (GLUCOPHAGE -XR) 500 MG 24 hr tablet Take 1,000 mg by mouth in the morning and at bedtime.   Multiple Vitamin (MULTIVITAMIN WITH MINERALS) TABS tablet Take 1 tablet by mouth daily. Centrum Silver   omeprazole (PRILOSEC) 40 MG capsule Take 40 mg by mouth daily.   ONE TOUCH ULTRA TEST test strip    pramipexole  (MIRAPEX ) 1.5 MG tablet Take 1.5 mg by mouth at bedtime.   sacubitril-valsartan (ENTRESTO ) 49-51 MG Take 1 tablet by mouth 2 (two) times daily.   thiamine  (VITAMIN B1)  100 MG tablet daily.     Allergies:   Atenolol, Lisinopril , Lorazepam , Penicillin g, Penicillins, and Sulfa antibiotics   Social History   Tobacco Use   Smoking status: Never   Smokeless tobacco: Never  Vaping Use   Vaping status: Never Used  Substance Use Topics   Alcohol  use: No   Drug use: No     Family Hx: The patient's family history includes Arthritis/Rheumatoid in her mother; Cancer in her brother; Diabetes in her mother; Heart attack in her father; Hypertension in her mother; Stroke in her sister.  ROS:   Please see the history of present illness.     All other systems reviewed and are negative.   Labs/Other Tests and Data Reviewed:    Recent Labs: 03/15/2023: ALT 13; TSH 1.086 03/19/2023: B Natriuretic Peptide 114.7; Magnesium  2.0 06/10/2023: NT-Pro BNP 392 08/20/2023: Hemoglobin 11.8; Platelets 180 09/21/2023: BUN 12; Creatinine, Ser 0.74; Potassium 4.8; Sodium 137   Recent Lipid Panel Lab Results  Component Value Date/Time   CHOL 111 03/16/2023 04:59 AM   TRIG 107 03/16/2023 04:59 AM   HDL 59 03/16/2023 04:59 AM   CHOLHDL 1.9 03/16/2023 04:59 AM   LDLCALC 31 03/16/2023 04:59 AM    Wt Readings from Last 3 Encounters:  09/23/23 159 lb 12.8 oz (72.5 kg)  09/08/23 159 lb 4.8 oz (72.3 kg)  06/10/23 158 lb (71.7 kg)      Objective:    Vital Signs:  BP 106/67 (BP Location: Right Arm)   Pulse 68   Ht 5' 1 (1.549 m)   Wt 159 lb 12.8 oz (72.5 kg)   SpO2 97%   BMI 30.19 kg/m   GEN: Well nourished, well developed in no acute distress HEENT: Normal NECK: No JVD; No carotid bruits LYMPHATICS: No lymphadenopathy CARDIAC:RRR, no rubs, gallops 2/6 SM at RUSB  RESPIRATORY:  Clear to auscultation without rales, wheezing or rhonchi  ABDOMEN: Soft, non-tender, non-distended MUSCULOSKELETAL:  No edema; No deformity  SKIN: Warm and dry NEUROLOGIC:  Alert and oriented x 3 PSYCHIATRIC:  Normal affect   ASSESSMENT & PLAN:   ASCAD  - remote cath showed severe mid LAD stenosis s/p 1 vessel CABG (LIMA to LAD) at the time of her Bentall procedure in 2015.   - Stress PET CT 04/26/2022 showed no ischemia and normal LV function with normal myocardial blood flow reserve - 2D echo 03/27/2023 showed EF 40 to 45% with global HK - Repeat stress PET CT 08/12/2023 showed no ischemia and normal myocardial blood flow reserve with EF 35% - She denies any anginal symptoms - Continue aspirin  81 mg daily, atorvastatin  10 mg daily, carvedilol  6.25 mg twice daily with as needed refills  Hypertension  - BP controlled on exam today - Continue Entresto  49-51 mg twice daily and carvedilol  6.25 mg twice daily - BP soft this am but suspect is inaccurate as her home BP's have been in the 130/70-80's  Severe AI  - she is status post Bentall procedure with aortic root replacement in 2015.   -2D echo 03/16/2023 with stable 23 mm bioprosthetic Magna Ease AVR with mean AVG 11 mmHg and no perivalvular leak  Chronic combined systolic /diastolic CHF  NICM SOB -2D echo 03/27/2023 showed EF 40 to 45% -Stress PET CT 03/27/2023 showed EF 35 to 40% no ischemia -CMR 08/25/2023 CMR showed EF 30%, normal RV and no evidence of LGE.  Extracellular volume percentage was elevated suggestive of myocardial fibrotic content.   -Stress PET CT showed no  ischemia 08/2023 -she appears euvolemic on exam today -Continue prescription drug managed with carvedilol  6.25 mg twice daily -GDMT: Continue carvedilol  6.25 mg twice daily, Lasix  20 mg daily as needed for weight gain, Entresto  49-51 mg twice daily. -Add spironolactone 12.5 mg daily -No SGLT2 due to history of yeast infections -Bmet in 1 week -I have asked her to check her BP twice daily for a week and call with results -Follow-up 2D echo in 6 weeks  Hyperlipidemia  -her LDL goal is less than 70.  -I have personally reviewed and interpreted outside labs performed by patient's PCP which showed LDL 31, HDL 59, ALT 13 on  03/16/23 -Continue atorvastatin  10 mg daily with as needed refills   Bilateral carotid artery stenosis  -Dopplers done 06/2023 showed 1 to 39% bilateral carotid stenosis -Continue aspirin  81 mg daily and statin therapy  followup with me in 2 months P echo completed   Medication Adjustments/Labs and Tests Ordered: Current medicines are reviewed at length with the patient today.  Concerns regarding medicines are outlined above.  Tests Ordered: No orders of the defined types were placed in this encounter.  Medication Changes: No orders of the defined types were placed in this encounter.   Disposition:  Follow up in 1 year(s)  Signed, Gaylyn Keas, MD  09/23/2023 11:21 AM    [facility]

## 2023-09-23 NOTE — Patient Instructions (Signed)
 Medication Instructions:  Please START taking spironolactone 12.5 mg daily.  *If you need a refill on your cardiac medications before your next appointment, please call your pharmacy*  Lab Work: Please complete a BMET in one week at any [facility]. You do not need to be fasting.  If you have labs (blood work) drawn today and your tests are completely normal, you will receive your results only by: MyChart Message (if you have MyChart) OR A paper copy in the mail If you have any lab test that is abnormal or we need to change your treatment, we will call you to review the results.  Testing/Procedures: Your physician has requested that you have an echocardiogram in 6 weeks. Echocardiography is a painless test that uses sound waves to create images of your heart. It provides your doctor with information about the size and shape of your heart and how well your heart's chambers and valves are working. This procedure takes approximately one hour. There are no restrictions for this procedure. Please do NOT wear cologne, perfume, aftershave, or lotions (deodorant is allowed). Please arrive 15 minutes prior to your appointment time.  Please note: We ask at that you not bring children with you during ultrasound (echo/ vascular) testing. Due to room size and safety concerns, children are not allowed in the ultrasound rooms during exams. Our front office staff cannot provide observation of children in our lobby area while testing is being conducted. An adult accompanying a patient to their appointment will only be allowed in the ultrasound room at the discretion of the ultrasound technician under special circumstances. We apologize for any inconvenience.   Follow-Up: At [facility], you and your health needs are our priority.  As part of our continuing mission to provide you with exceptional heart care, our providers are all part of one team.  This team includes your primary Cardiologist (physician)  and Advanced Practice Providers or APPs (Physician Assistants and Nurse Practitioners) who all work together to provide you with the care you need, when you need it.  Your next appointment:   2 month(s)  Provider:   Gaylyn Keas, MD    We recommend signing up for the patient portal called MyChart.  Sign up information is provided on this After Visit Summary.  MyChart is used to connect with patients for Virtual Visits (Telemedicine).  Patients are able to view lab/test results, encounter notes, upcoming appointments, etc.  Non-urgent messages can be sent to your provider as well.   To learn more about what you can do with MyChart, go to [email or web address].   Other Instructions Please check your blood pressure twice a day for one week, once at lunch, once at dinner. Write down all your readings and then call our operators to give the readings over the phone. You can also send readings over MyChart or drop them off at the front desk.

## 2023-09-23 NOTE — Addendum Note (Signed)
 Addended by: Cherylyn Cos on: 09/23/2023 11:59 AM   Modules accepted: Orders

## 2023-09-28 ENCOUNTER — Ambulatory Visit: Admitting: Neurology

## 2023-09-28 ENCOUNTER — Encounter: Payer: Self-pay | Admitting: Neurology

## 2023-09-28 VITALS — BP 114/82 | Ht 61.0 in | Wt 160.0 lb

## 2023-09-28 DIAGNOSIS — R569 Unspecified convulsions: Secondary | ICD-10-CM

## 2023-09-28 MED ORDER — LACOSAMIDE 50 MG PO TABS: 50.0000 mg | ORAL_TABLET | Freq: Two times a day (BID) | ORAL | 3 refills | Status: AC

## 2023-09-28 NOTE — Patient Instructions (Signed)
 Continue with Lacosamide  50 mg twice daily  Continue your other medications  Continue to follow up with your doctors  Return in a year or sooner if worse

## 2023-09-28 NOTE — Progress Notes (Signed)
 [facility]  PATIENT: [name] [name] DOB: 11-08-1945  REQUESTING CLINICIAN: Dwight Trula SQUIBB, MD HISTORY FROM: Patient, Husband and daughter  REASON FOR VISIT: Seizure/Cognitive impairment    HISTORICAL  CHIEF COMPLAINT:  Chief Complaint  Patient presents with   Follow-up    Rm 12, with husband and daughter. No sz reported. 1 missed medication dose. Denies SI/HI, no stroke symptoms reported or deficits    INTERVAL HISTORY 09/28/2023 Patient presents today for follow-up, last visit was in February.  At that time we checked the lacosamide  level it was elevated at 13.6, decreased the lacosamide  to 50 mg twice daily and her side effects improved.  Family has been telling me that patient cognition has improved, she is less confused, her memory is getting much better.  She has restarted balancing her checkbook and paying her bills.  Husband is still helping her with her medications but she is ready to restart doing her medication by herself.  Tells me sleep is fine, she was recently diagnosed with worsening heart function, EF 35% and is working with cardiology.   HISTORY OF PRESENT ILLNESS:  This 78 year old woman past medical history hypertension, hyperlipidemia, diabetes mellitus, heart disease, who is presenting after being admitted to the hospital for TIA and/or seizure.  History mainly obtained from husband today.  The day of presentation, patient was noted to be confused by husband.  She made coffee but did not put water  in the coffee maker and told her husband that the coffee maker broke.  She was trying to get her husband breakfast when she dropped milk, then at that time husband noted that words were not coming out, and she did have right facial weakness.  She was taken to the hospital.  Initially her stroke workup including MRI brain did not show any acute stroke.  Due to concern of encephalitis/meningitis, patient was treated with antibiotics and had LP.  LP was bland,   EEG showed epileptiform discharges in the left frontal area and left frontal slowing.  She was thought to have a seizure.  Patient was started on Keppra  but did develop a rash, not sure if the rash was from the Keppra  or antibiotic.  Keppra  was discontinued and patient started on lacosamide .  Since discharge from the hospital, she was completed rehab for 1 week and now is at home.  She is still doing home PT.  Family tells me that her main complaint now are tiredness and fatigue. She reports compliance with the medication but there are complaints of tiredness, fatigue and tremors.   Handedness: Right handed   Onset: 03/15/2023  Seizure Type: Confusion, slurred speech,   Current frequency: only once   Any injuries from seizures: Denies   Seizure risk factors: Prior strokes   Previous ASMs: None   Currenty ASMs: Lacosamide  50 mg twice daily   ASMs side effects: None (after decrease Lacosamide  to 50 mg BID)  Brain Images: No acute findings, chronic strokes   Previous EEGs: Left frontal epileptiform discharges, left frontal slowing     Hospital course and summary  78 y.o. female with PMH significant of coronary artery disease, chronic diastolic CHF, diabetes mellitus 2, HTN, hyperlipidemia AR s/p bioprosthetic valve repair, CABG in 2015, s/p aortic root replacement, CAD, Thyroiditis  presented to ED with sudden confusion and aphasia this morning. She was also noted to have right facial droop, came to the ER and was seen by neurology and admitted to the hospital for further workup.  She  was started on antibiotics due to concern for meningitis. LP was completed, negative for infection. History and findings were concerning more for seizures giving similar events in the past, neurology were consulted, please see discussion below.   Severe toxic and metabolic encephalopathy present on admission.  Seizures -Patient underwent stroke workup including MRI brain, CTA head and neck, P shows  unremarkable CSF, overnight EEG raises question of some epileptogenicity. - Per patient's family, she has been having memory issues for last 6 to 9 months, ambulates with a cane,   - UA was unremarkable, stable ammonia, stable TSH, - low B12 - replaced.  Continue with supplements on discharge -Was empirically on B1 supplements, follow on levels, still pending at time of discharge -Concern for seizures, was on LTM EEG,  Interictal sharp waves were present on EEG. She was started and then dosage was I, LTM EEG continues to show sharp waves and evidence of epiletogenicity.  Keppra  dose was adjusted and increased, no further waves on Keppra  750 mg p.o. twice daily, patient did develop rash in the back and the chest area, concern for Keppra  causing the rash, but to be on the cautious side, her Keppra  has been changed to Vimpat  100 mg p.o. twice daily . -Neurology team reviewed seizure precautions and no driving for 6 months with the patient and her husband at bedside    OTHER MEDICAL CONDITIONS: Hypertension, hyperlipidemia, TIA, heart disease, Diabetes Mellitus II.   REVIEW OF SYSTEMS: Full 14 system review of systems performed and negative with exception of: As noted in the HPI  ALLERGIES: Allergies  Allergen Reactions   Atenolol Shortness Of Breath and Other (See Comments)    Edema and Headache   Lisinopril  Cough   Lorazepam  Other (See Comments)    Over sedation. Family does not want lorazepam .    Penicillin G Other (See Comments)    Other Reaction(s): Not available   Penicillins Other (See Comments) and Rash    Has patient had a PCN reaction causing immediate rash, facial/tongue/throat swelling, SOB or lightheadedness with hypotension: No  Has patient had a PCN reaction causing severe rash involving mucus membranes or skin necrosis: No  Has patient had a PCN reaction that required hospitalization No  Has patient had a PCN reaction occurring within the last 10 years: No  If all of the  above answers are NO, then may proceed with Cephalosporin use.G Benzathine: Local injection rash  Other Reaction(s): Not available   Sulfa Antibiotics Hives and Rash    HOME MEDICATIONS: Outpatient Medications Prior to Visit  Medication Sig Dispense Refill   aspirin  81 MG chewable tablet Chew 1 tablet (81 mg total) by mouth 2 (two) times daily. 60 tablet 1   atorvastatin  (LIPITOR) 10 MG tablet TAKE 1 TABLET BY MOUTH EVERY DAY 90 tablet 3   Calcium  Carb-Cholecalciferol (CALCIUM  600-D PO) Take 1 tablet by mouth daily.     carvedilol  (COREG ) 6.25 MG tablet Take 1 tablet (6.25 mg total) by mouth 2 (two) times daily with a meal. 180 tablet 3   clindamycin  (CLEOCIN ) 150 MG capsule Take 4 capsules by mouth as needed. (Take one (1) hour prior to dental procedures)  2   cyanocobalamin  1000 MCG tablet Take 1 tablet (1,000 mcg total) by mouth daily.     furosemide  (LASIX ) 20 MG tablet Take 1 tablet (20 mg total) by mouth daily as needed (wt gain 3 lbs in 24 hrs or 5lbs in 1 week). 30 tablet 11   glimepiride  (AMARYL )  2 MG tablet Take 4 mg by mouth daily with breakfast.     GLUCOSAMINE-CHONDROITIN PO Take 1 tablet by mouth daily. Glucosamine 1200 mg, chondroitin 1500 mg     metFORMIN  (GLUCOPHAGE -XR) 500 MG 24 hr tablet Take 1,000 mg by mouth in the morning and at bedtime.     Multiple Vitamin (MULTIVITAMIN WITH MINERALS) TABS tablet Take 1 tablet by mouth daily. Centrum Silver     omeprazole (PRILOSEC) 40 MG capsule Take 40 mg by mouth daily.     ONE TOUCH ULTRA TEST test strip      pramipexole  (MIRAPEX ) 1.5 MG tablet Take 1.5 mg by mouth at bedtime.     sacubitril-valsartan (ENTRESTO ) 49-51 MG Take 1 tablet by mouth 2 (two) times daily. 180 tablet 3   spironolactone (ALDACTONE) 25 MG tablet Take 0.5 tablets (12.5 mg total) by mouth daily. 45 tablet 3   thiamine  (VITAMIN B-1) 100 MG tablet Take 1 tablet (100 mg total) by mouth daily.     thiamine  (VITAMIN B1) 100 MG tablet daily.     lacosamide   (VIMPAT ) 50 MG TABS tablet Take 1 tablet (50 mg total) by mouth 2 (two) times daily. 60 tablet 5   Apoaequorin (PREVAGEN) 10 MG CAPS Take 1 tablet by mouth daily. (Patient not taking: Reported on 09/28/2023)     No facility-administered medications prior to visit.    PAST MEDICAL HISTORY: Past Medical History:  Diagnosis Date   Adenomatous colon polyp    Aortic regurgitation    severe by TEE s/p pericardia tissue AVR   Arthritis    Cardiomyopathy (HCC)    EF initally 25% but normalized>>repeat echo 5/20205 with EF 30-35%   Carotid stenosis 08/17/2017   1-39% bilateral carotid stenosis  dopplers 06/2023   Chronic diastolic CHF (congestive heart failure), NYHA class 2 (HCC)    Coronary artery disease 10/05/2013   Severe mid LAD stenosis s/p 1 vessel CABG with LIMA to LAD   Diabetes mellitus without complication (HCC)    DX 20 YRS AGO.   TYPE 2   Dilated aortic root (HCC)    GERD (gastroesophageal reflux disease)    Tums prn   Gout    Hypercholesteremia    LDL goal < 70   Hypertension    S/P Bio-Bentall aortic root replacement with bioprosthetic valve conduit 10/27/2013   23 mm [facility] Ease bovine pericardial tissue valve and 26 mm Vascutek gelweave Val-Salva aortic graft   S/P CABG x 1 10/27/2013   LIMA to LAD   Thyroiditis 10/2009   lab and u/s-thyroid  function normalized in 9/11    PAST SURGICAL HISTORY: Past Surgical History:  Procedure Laterality Date   ABDOMINAL HYSTERECTOMY     AORTIC VALVE REPLACEMENT N/A 10/27/2013   Procedure: AORTIC VALVE REPLACEMENT (AVR);  Surgeon: Sudie VEAR Laine, MD;  Location: [facility] OR;  Service: Open Heart Surgery;  Laterality: N/A;   ASCENDING AORTIC ROOT REPLACEMENT N/A 10/27/2013   Procedure: ASCENDING AORTIC ROOT REPLACEMENT;  Surgeon: Sudie VEAR Laine, MD;  Location: [facility] OR;  Service: Open Heart Surgery;  Laterality: N/A;   BUNIONECTOMY     bilateral   CARDIAC CATHETERIZATION     CARDIAC VALVE REPLACEMENT     AORTIC VALVE   CARPAL  TUNNEL RELEASE Right    CATARACT EXTRACTION Bilateral    COLONOSCOPY     COLONOSCOPY WITH PROPOFOL  N/A 05/08/2015   Procedure: COLONOSCOPY WITH PROPOFOL ;  Surgeon: Gladis MARLA Louder, MD;  Location: [facility] ENDOSCOPY;  Service: Endoscopy;  Laterality:  N/A;   CORONARY ARTERY BYPASS GRAFT N/A 10/27/2013   Procedure: CORONARY ARTERY BYPASS GRAFTING (CABG) x 1 - LIMA to LAD;  Surgeon: Sudie VEAR Laine, MD;  Location: [facility] OR;  Service: Open Heart Surgery;  Laterality: N/A;   EYE SURGERY     BILATERAL CATARACTS   INTRAOPERATIVE TRANSESOPHAGEAL ECHOCARDIOGRAM N/A 10/27/2013   Procedure: INTRAOPERATIVE TRANSESOPHAGEAL ECHOCARDIOGRAM;  Surgeon: Sudie VEAR Laine, MD;  Location: [facility] OR;  Service: Open Heart Surgery;  Laterality: N/A;   JOINT REPLACEMENT     left total knee   KNEE ARTHROPLASTY Right 01/26/2017   Procedure: RIGHT TOTAL KNEE ARTHROPLASTY WITH COMPUTER NAVIGATION;  Surgeon: Fidel Rogue, MD;  Location: [facility] OR;  Service: Orthopedics;  Laterality: Right;  Needs RNFA   KNEE ARTHROPLASTY Left 05/28/2017   Procedure: LEFT TOTAL KNEE ARTHROPLASTY WITH COMPUTER NAVIGATION;  Surgeon: Fidel Rogue, MD;  Location: [facility] ORS;  Service: Orthopedics;  Laterality: Left;  Needs RNFA   LEFT AND RIGHT HEART CATHETERIZATION WITH CORONARY ANGIOGRAM N/A 10/05/2013   Procedure: LEFT AND RIGHT HEART CATHETERIZATION WITH CORONARY ANGIOGRAM;  Surgeon: Ozell JONETTA Fell, MD;  Location: [facility] CATH LAB;  Service: Cardiovascular;  Laterality: N/A;   TEE WITHOUT CARDIOVERSION N/A 10/04/2013   Procedure: TRANSESOPHAGEAL ECHOCARDIOGRAM (TEE);  Surgeon: Wilbert JONELLE Bihari, MD;  Location: [facility] ENDOSCOPY;  Service: Cardiovascular;  Laterality: N/A;   tubular adenomatous polyp colonoscopy  12/31/2009    FAMILY HISTORY: Family History  Problem Relation Age of Onset   Hypertension Mother    Diabetes Mother    Arthritis/Rheumatoid Mother    Heart attack Father    Stroke Sister    Cancer Brother     SOCIAL HISTORY: Social History    Socioeconomic History   Marital status: Married    Spouse name: Not on file   Number of children: Not on file   Years of education: Not on file   Highest education level: Not on file  Occupational History   Not on file  Tobacco Use   Smoking status: Never   Smokeless tobacco: Never  Vaping Use   Vaping status: Never Used  Substance and Sexual Activity   Alcohol  use: No   Drug use: No   Sexual activity: Not on file  Other Topics Concern   Not on file  Social History Narrative   Right handed   Caffeine - none   Married   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   Social Drivers of Corporate investment banker Strain: Not on file  Food Insecurity: Unknown (03/16/2023)   Hunger Vital Sign    Worried About Running Out of Food in the Last Year: Never true    Ran Out of Food in the Last Year: Patient declined  Transportation Needs: Patient Declined (03/16/2023)   PRAPARE - Administrator, Civil Service (Medical): Patient declined    Lack of Transportation (Non-Medical): Patient declined  Physical Activity: Not on file  Stress: Not on file  Social Connections: Not on file  Intimate Partner Violence: Patient Unable To Answer (03/16/2023)   Humiliation, Afraid, Rape, and Kick questionnaire    Fear of Current or Ex-Partner: Patient unable to answer    Emotionally Abused: Patient unable to answer    Physically Abused: Patient unable to answer    Sexually Abused: Patient unable to answer     PHYSICAL EXAM  GENERAL EXAM/CONSTITUTIONAL: Vitals:  Vitals:   09/28/23 1325  BP: 114/82  Weight: 160 lb (72.6 kg)  Height: 5' 1 (1.549 m)  Body mass index is 30.23 kg/m. Wt Readings from Last 3 Encounters:  09/28/23 160 lb (72.6 kg)  09/23/23 159 lb 12.8 oz (72.5 kg)  09/08/23 159 lb 4.8 oz (72.3 kg)   Patient is in no distress; well developed, nourished and groomed; neck is supple  MUSCULOSKELETAL: Gait, strength, tone, movements noted in Neurologic exam  below  NEUROLOGIC: MENTAL STATUS:      No data to display         awake, alert, oriented to person, place and time Difficulty remembering hospital stay normal attention and concentration language fluent, comprehension intact, naming intact fund of knowledge appropriate  CRANIAL NERVE:  2nd, 3rd, 4th, 6th - Visual fields full to confrontation, extraocular muscles intact, no nystagmus 5th - facial sensation symmetric 7th - facial strength symmetric 8th - hearing intact 9th - palate elevates symmetrically, uvula midline 11th - shoulder shrug symmetric 12th - tongue protrusion midline  MOTOR:  normal bulk and tone, full strength in the BUE, BLE  SENSORY:  normal and symmetric to light touch  COORDINATION:  finger-nose-finger, fine finger movements normal  GAIT/STATION:  normal   DIAGNOSTIC DATA (LABS, IMAGING, TESTING) - I reviewed patient records, labs, notes, testing and imaging myself where available.  Lab Results  Component Value Date   WBC 9.0 08/20/2023   HGB 11.8 08/20/2023   HCT 35.8 08/20/2023   MCV 96 08/20/2023   PLT 180 08/20/2023      Component Value Date/Time   NA 137 09/21/2023 1105   K 4.8 09/21/2023 1105   CL 102 09/21/2023 1105   CO2 19 (L) 09/21/2023 1105   GLUCOSE 219 (H) 09/21/2023 1105   GLUCOSE 131 (H) 03/19/2023 0503   BUN 12 09/21/2023 1105   CREATININE 0.74 09/21/2023 1105   CALCIUM  9.2 09/21/2023 1105   PROT 7.9 03/15/2023 1027   ALBUMIN  4.5 03/15/2023 1027   AST 21 03/15/2023 1027   ALT 13 03/15/2023 1027   ALKPHOS 46 03/15/2023 1027   BILITOT 0.8 03/15/2023 1027   GFRNONAA >60 03/19/2023 0503   GFRAA >60 05/29/2017 0533   Lab Results  Component Value Date   CHOL 111 03/16/2023   HDL 59 03/16/2023   LDLCALC 31 03/16/2023   TRIG 107 03/16/2023   Lab Results  Component Value Date   HGBA1C 7.1 (H) 03/16/2023   Lab Results  Component Value Date   VITAMINB12 117 (L) 03/15/2023   Lab Results  Component Value Date    TSH 1.086 03/15/2023   VIMPAT  level 05/12/2023: 13.6  MRI Brain 03/17/2023 1. No acute intracranial abnormality. 2. Mild chronic white matter disease. 3. Small remote chronic infarcts in the bilateral centrum semiovale, corona radiata and chronic cortical infarct in the left frontal lobe. 4. Small foci of hemosiderin deposit in the bilateral occipital horns and left temporal horn of the lateral ventricles as well as in the left frontal lobe. 5. Bilateral mastoid effusion  EEG 03/16/2023 - Sharp wave, left frontal region.  - Continuous slow, left hemisphere   I personally reviewed brain Image.   ASSESSMENT AND PLAN  78 y.o. year old female  with history of hypertension, hyperlipidemia, diabetes mellitus, heart disease, seizure who is presenting for follow up.  She is doing well on lacosamide  50 mg twice daily, and family has reported improvement in her memory.  She has starting balancing her checkbook and will start doing her medication by herself.  Plan will be for patient to continue with lacosamide  50 mg twice daily, she will  continue to follow with doctors including cardiology and I will see her in a year for follow-up or sooner if worse.  She voiced understanding. Return sooner if worse    1. Seizure [facility])      Patient Instructions  Continue with Lacosamide  50 mg twice daily  Continue your other medications  Continue to follow up with your doctors  Return in a year or sooner if worse    Per [place]  DMV statutes, patients with seizures are not allowed to drive until they have been seizure-free for six months.  Other recommendations include using caution when using heavy equipment or power tools. Avoid working on ladders or at heights. Take showers instead of baths.  Do not swim alone.  Ensure the water  temperature is not too high on the home water  heater. Do not go swimming alone. Do not lock yourself in a room alone (i.e. bathroom). When caring for infants or small  children, sit down when holding, feeding, or changing them to minimize risk of injury to the child in the event you have a seizure. Maintain good sleep hygiene. Avoid alcohol .  Also recommend adequate sleep, hydration, good diet and minimize stress.   During the Seizure  - First, ensure adequate ventilation and place patients on the floor on their left side  Loosen clothing around the neck and ensure the airway is patent. If the patient is clenching the teeth, do not force the mouth open with any object as this can cause severe damage - Remove all items from the surrounding that can be hazardous. The patient may be oblivious to what's happening and may not even know what he or she is doing. If the patient is confused and wandering, either gently guide him/her away and block access to outside areas - Reassure the individual and be comforting - Call 911. In most cases, the seizure ends before EMS arrives. However, there are cases when seizures may last over 3 to 5 minutes. Or the individual may have developed breathing difficulties or severe injuries. If a pregnant patient or a person with diabetes develops a seizure, it is prudent to call an ambulance. - Finally, if the patient does not regain full consciousness, then call EMS. Most patients will remain confused for about 45 to 90 minutes after a seizure, so you must use judgment in calling for help. - Avoid restraints but make sure the patient is in a bed with padded side rails - Place the individual in a lateral position with the neck slightly flexed; this will help the saliva drain from the mouth and prevent the tongue from falling backward - Remove all nearby furniture and other hazards from the area - Provide verbal assurance as the individual is regaining consciousness - Provide the patient with privacy if possible - Call for help and start treatment as ordered by the caregiver   After the Seizure (Postictal Stage)  After a seizure, most  patients experience confusion, fatigue, muscle pain and/or a headache. Thus, one should permit the individual to sleep. For the next few days, reassurance is essential. Being calm and helping reorient the person is also of importance.  Most seizures are painless and end spontaneously. Seizures are not harmful to others but can lead to complications such as stress on the lungs, brain and the heart. Individuals with prior lung problems may develop labored breathing and respiratory distress.    Discussed Patients with epilepsy have a small risk of sudden unexpected death, a condition referred to as  sudden unexpected death in epilepsy (SUDEP). SUDEP is defined specifically as the sudden, unexpected, witnessed or unwitnessed, nontraumatic and nondrowning death in patients with epilepsy with or without evidence for a seizure, and excluding documented status epilepticus, in which post mortem examination does not reveal a structural or toxicologic cause for death     No orders of the defined types were placed in this encounter.   Meds ordered this encounter  Medications   lacosamide  (VIMPAT ) 50 MG TABS tablet    Sig: Take 1 tablet (50 mg total) by mouth 2 (two) times daily.    Dispense:  180 tablet    Refill:  3    Return in about 1 year (around 09/27/2024).    Pastor Falling, MD 09/28/2023, 4:29 PM  [facility] [address] [place], [place] [address] [phone]

## 2023-09-30 DIAGNOSIS — Z79899 Other long term (current) drug therapy: Secondary | ICD-10-CM | POA: Diagnosis not present

## 2023-09-30 DIAGNOSIS — I10 Essential (primary) hypertension: Secondary | ICD-10-CM | POA: Diagnosis not present

## 2023-09-30 DIAGNOSIS — I5042 Chronic combined systolic (congestive) and diastolic (congestive) heart failure: Secondary | ICD-10-CM | POA: Diagnosis not present

## 2023-10-01 ENCOUNTER — Telehealth: Payer: Self-pay | Admitting: Cardiology

## 2023-10-01 ENCOUNTER — Ambulatory Visit: Payer: Self-pay | Admitting: Cardiology

## 2023-10-01 LAB — BASIC METABOLIC PANEL WITH GFR
BUN/Creatinine Ratio: 21 (ref 12–28)
BUN: 17 mg/dL (ref 8–27)
CO2: 20 mmol/L (ref 20–29)
Calcium: 9.7 mg/dL (ref 8.7–10.3)
Chloride: 102 mmol/L (ref 96–106)
Creatinine, Ser: 0.82 mg/dL (ref 0.57–1.00)
Glucose: 160 mg/dL — ABNORMAL HIGH (ref 70–99)
Potassium: 5.1 mmol/L (ref 3.5–5.2)
Sodium: 138 mmol/L (ref 134–144)
eGFR: 72 mL/min/{1.73_m2} (ref >59)

## 2023-10-01 NOTE — Telephone Encounter (Signed)
 Pt aware will forward readings to Dr Shlomo  Will forward B/P readings to Dr Shlomo .lorrayne

## 2023-10-01 NOTE — Telephone Encounter (Signed)
 Informed  pt to notify PCP of low B 12 . Will let Dr Shlomo know .lorrayne

## 2023-10-01 NOTE — Telephone Encounter (Signed)
 New Message:     Patient is calling to give her blood pressure readings for a week.   09-24-23-    139/67 pulse 70      131/78 pulse 75 09-25-23-    100/57 pulse 72       173/101 pulse 70 09-26-23-     163/86  pulse 72      144/84 pulse 73 09-27-23        162/80 pulse 75       118/77 pulse 76 09-28-23          114/82 pulse 72       119/71 pulse 69 09-29-23            121/74 pulse 73     133/69 pulse 78 09-30-23-            148/74 pulse 76     139/57  pulse 69

## 2023-10-01 NOTE — Telephone Encounter (Signed)
 Pt c/o medication issue:  1. Name of Medication: B-12  2. How are you currently taking this medication (dosage and times per day)?   3. Are you having a reaction (difficulty breathing--STAT)?   4. What is your medication issue? Patient wants to know what is the medicine B- 12  for? She said her Neurologist said yesterday that her B-12 was low. She said she is already taking B-12.

## 2023-10-05 NOTE — Telephone Encounter (Signed)
 Spoke with pt and advised of Dr Dorine comments as below.  Pt verbalizes understanding and thanked Charity fundraiser for the call.

## 2023-10-06 DIAGNOSIS — M5416 Radiculopathy, lumbar region: Secondary | ICD-10-CM | POA: Diagnosis not present

## 2023-11-06 ENCOUNTER — Other Ambulatory Visit (HOSPITAL_COMMUNITY)

## 2023-11-10 ENCOUNTER — Ambulatory Visit (HOSPITAL_COMMUNITY)
Admission: RE | Admit: 2023-11-10 | Discharge: 2023-11-10 | Disposition: A | Discharge status: Home / Self Care | Source: Ambulatory Visit | Attending: Cardiology | Admitting: Cardiology

## 2023-11-10 DIAGNOSIS — I425 Other restrictive cardiomyopathy: Secondary | ICD-10-CM | POA: Diagnosis not present

## 2023-11-10 LAB — ECHOCARDIOGRAM COMPLETE
AR max vel: 1.52 cm2
AV Area VTI: 1.38 cm2
AV Area mean vel: 1.43 cm2
AV Mean grad: 13.1 mmHg
AV Peak grad: 23.5 mmHg
Ao pk vel: 2.42 m/s
Area-P 1/2: 4.1 cm2
S' Lateral: 3.7 cm

## 2023-11-11 ENCOUNTER — Telehealth: Payer: Self-pay | Admitting: Cardiology

## 2023-11-11 NOTE — Telephone Encounter (Signed)
 Pt calling in for echo results

## 2023-11-12 NOTE — Telephone Encounter (Signed)
 Call to patient to discuss echo results. Patient verbalizes understanding of mildly reduced LVF with EF 40-45% with mildly thickened heart muscle called left ventricular hypertrophy, increased stiffness of the heart muscle called diastolic dysfunction. Trivial leakiness of MV discussed, stable AV replacement and aortic root repair. Patient agrees to check her BP twice daily for a week and call with results.

## 2023-11-12 NOTE — Telephone Encounter (Signed)
-----   Message from Wilbert Bihari sent at 11/11/2023  8:09 AM EDT ----- Echo showed mildly reduced LVF with EF 40-45% with mildly thickened heart muscle called left ventricular hypertrophy, increased stiffness of the heart muscle called diastolic dysfunction, trivial  leakiness of the mitral valve, stable AV replacement and aortic root repair. No change from 03/2023.  Please have her check her BP twice daily for a week and call with results ----- Message ----- From: Interface, [employer] Sent: 11/10/2023   9:15 PM EDT To: Wilbert JONELLE Bihari, MD

## 2023-11-12 NOTE — Telephone Encounter (Signed)
 See encounter from 10/01/23.

## 2023-11-18 DIAGNOSIS — I10 Essential (primary) hypertension: Secondary | ICD-10-CM | POA: Diagnosis not present

## 2023-11-18 DIAGNOSIS — E1129 Type 2 diabetes mellitus with other diabetic kidney complication: Secondary | ICD-10-CM | POA: Diagnosis not present

## 2023-11-18 DIAGNOSIS — K219 Gastro-esophageal reflux disease without esophagitis: Secondary | ICD-10-CM | POA: Diagnosis not present

## 2023-11-18 DIAGNOSIS — E538 Deficiency of other specified B group vitamins: Secondary | ICD-10-CM | POA: Diagnosis not present

## 2023-11-18 DIAGNOSIS — I5022 Chronic systolic (congestive) heart failure: Secondary | ICD-10-CM | POA: Diagnosis not present

## 2023-11-18 DIAGNOSIS — I110 Hypertensive heart disease with heart failure: Secondary | ICD-10-CM | POA: Diagnosis not present

## 2023-11-23 ENCOUNTER — Ambulatory Visit: Payer: Medicare PPO | Admitting: Neurology

## 2023-11-25 ENCOUNTER — Encounter: Payer: Self-pay | Admitting: Cardiology

## 2023-11-25 ENCOUNTER — Ambulatory Visit: Attending: Cardiology | Admitting: Cardiology

## 2023-11-25 VITALS — BP 118/68 | HR 85 | Ht 61.0 in | Wt 158.4 lb

## 2023-11-25 DIAGNOSIS — I5042 Chronic combined systolic (congestive) and diastolic (congestive) heart failure: Secondary | ICD-10-CM

## 2023-11-25 DIAGNOSIS — I351 Nonrheumatic aortic (valve) insufficiency: Secondary | ICD-10-CM | POA: Diagnosis not present

## 2023-11-25 DIAGNOSIS — I10 Essential (primary) hypertension: Secondary | ICD-10-CM | POA: Diagnosis not present

## 2023-11-25 DIAGNOSIS — I2510 Atherosclerotic heart disease of native coronary artery without angina pectoris: Secondary | ICD-10-CM

## 2023-11-25 DIAGNOSIS — I6523 Occlusion and stenosis of bilateral carotid arteries: Secondary | ICD-10-CM

## 2023-11-25 DIAGNOSIS — Z79899 Other long term (current) drug therapy: Secondary | ICD-10-CM | POA: Diagnosis not present

## 2023-11-25 DIAGNOSIS — E785 Hyperlipidemia, unspecified: Secondary | ICD-10-CM

## 2023-11-25 DIAGNOSIS — I420 Dilated cardiomyopathy: Secondary | ICD-10-CM | POA: Diagnosis not present

## 2023-11-25 MED ORDER — SACUBITRIL-VALSARTAN 97-103 MG PO TABS: 1.0000 | ORAL_TABLET | Freq: Two times a day (BID) | ORAL | 3 refills | Status: AC

## 2023-11-25 NOTE — Progress Notes (Signed)
 9 Date:  11/25/2023   ID:  [name] [name], DOB 1945-11-13, MRN [id number]   PCP:  Dwight Trula SQUIBB, MD  Cardiologist:  Wilbert Bihari, MD  Electrophysiologist:  None   Chief Complaint:  AI, HTN, DCM, CAD, lipids  History of Present Illness:     [name] [name] is a 78 y.o. female with a hx of PVC's, dilated aortic root, severe AR, HTN, severe LV dysfunction with EF 25% (now normalized), severe mid LAD stenosis s/p 1 vessel CABG (LIMA to LAD) and Bentall procedure with aortic root replacement with pericardial tissue AVR .      2D echo 03/27/2023 showed EF 40 to 45%.  A stress PET CT 08/12/2023 showed normal myocardial perfusion with EF 35% with normal myocardial blood flow reserve.  She was started on Entresto  24-26 mg twice daily and continued on carvedilol .  CMR showed EF 30%, normal RV and no evidence of LGE.  Extracellular volume percentage was elevated suggestive of myocardial fibrotic content.  She is intolerant to SGLT2 due to history of yeast infections.    She is here today for followup and is doing well.  She has chronic DOE which is stable.  She ambulates with a cane.  She denies any CP or pressure, PND, orthopnea, LE edema, dizziness, palpitations or syncope.  Prior CV studies:   The following studies were reviewed today:None   Past Medical History:  Diagnosis Date   Adenomatous colon polyp    Aortic regurgitation    severe by TEE s/p pericardia tissue AVR   Arthritis    Cardiomyopathy (HCC)    EF initally 25% but normalized>>repeat echo 5/20205 with EF 30-35%   Carotid stenosis 08/17/2017   1-39% bilateral carotid stenosis  dopplers 06/2023   Chronic diastolic CHF (congestive heart failure), NYHA class 2 (HCC)    Coronary artery disease 10/05/2013   Severe mid LAD stenosis s/p 1 vessel CABG with LIMA to LAD   Diabetes mellitus without complication (HCC)    DX 20 YRS AGO.   TYPE 2   Dilated aortic root (HCC)    GERD (gastroesophageal reflux disease)    Tums prn   Gout     Hypercholesteremia    LDL goal < 70   Hypertension    S/P Bio-Bentall aortic root replacement with bioprosthetic valve conduit 10/27/2013   23 mm [facility] Ease bovine pericardial tissue valve and 26 mm Vascutek gelweave Val-Salva aortic graft   S/P CABG x 1 10/27/2013   LIMA to LAD   Thyroiditis 10/2009   lab and u/s-thyroid  function normalized in 9/11   Past Surgical History:  Procedure Laterality Date   ABDOMINAL HYSTERECTOMY     AORTIC VALVE REPLACEMENT N/A 10/27/2013   Procedure: AORTIC VALVE REPLACEMENT (AVR);  Surgeon: Sudie VEAR Laine, MD;  Location: [facility] OR;  Service: Open Heart Surgery;  Laterality: N/A;   ASCENDING AORTIC ROOT REPLACEMENT N/A 10/27/2013   Procedure: ASCENDING AORTIC ROOT REPLACEMENT;  Surgeon: Sudie VEAR Laine, MD;  Location: [facility] OR;  Service: Open Heart Surgery;  Laterality: N/A;   BUNIONECTOMY     bilateral   CARDIAC CATHETERIZATION     CARDIAC VALVE REPLACEMENT     AORTIC VALVE   CARPAL TUNNEL RELEASE Right    CATARACT EXTRACTION Bilateral    COLONOSCOPY     COLONOSCOPY WITH PROPOFOL  N/A 05/08/2015   Procedure: COLONOSCOPY WITH PROPOFOL ;  Surgeon: Gladis MARLA Louder, MD;  Location: [facility] ENDOSCOPY;  Service: Endoscopy;  Laterality: N/A;   CORONARY ARTERY  BYPASS GRAFT N/A 10/27/2013   Procedure: CORONARY ARTERY BYPASS GRAFTING (CABG) x 1 - LIMA to LAD;  Surgeon: Sudie VEAR Laine, MD;  Location: [facility] OR;  Service: Open Heart Surgery;  Laterality: N/A;   EYE SURGERY     BILATERAL CATARACTS   INTRAOPERATIVE TRANSESOPHAGEAL ECHOCARDIOGRAM N/A 10/27/2013   Procedure: INTRAOPERATIVE TRANSESOPHAGEAL ECHOCARDIOGRAM;  Surgeon: Sudie VEAR Laine, MD;  Location: [facility] OR;  Service: Open Heart Surgery;  Laterality: N/A;   JOINT REPLACEMENT     left total knee   KNEE ARTHROPLASTY Right 01/26/2017   Procedure: RIGHT TOTAL KNEE ARTHROPLASTY WITH COMPUTER NAVIGATION;  Surgeon: Fidel Rogue, MD;  Location: [facility] OR;  Service: Orthopedics;  Laterality: Right;  Needs RNFA   KNEE  ARTHROPLASTY Left 05/28/2017   Procedure: LEFT TOTAL KNEE ARTHROPLASTY WITH COMPUTER NAVIGATION;  Surgeon: Fidel Rogue, MD;  Location: [facility] ORS;  Service: Orthopedics;  Laterality: Left;  Needs RNFA   LEFT AND RIGHT HEART CATHETERIZATION WITH CORONARY ANGIOGRAM N/A 10/05/2013   Procedure: LEFT AND RIGHT HEART CATHETERIZATION WITH CORONARY ANGIOGRAM;  Surgeon: Ozell JONETTA Fell, MD;  Location: [facility] CATH LAB;  Service: Cardiovascular;  Laterality: N/A;   TEE WITHOUT CARDIOVERSION N/A 10/04/2013   Procedure: TRANSESOPHAGEAL ECHOCARDIOGRAM (TEE);  Surgeon: Wilbert JONELLE Bihari, MD;  Location: [facility] ENDOSCOPY;  Service: Cardiovascular;  Laterality: N/A;   tubular adenomatous polyp colonoscopy  12/31/2009     Current Meds  Medication Sig   aspirin  81 MG chewable tablet Chew 1 tablet (81 mg total) by mouth 2 (two) times daily.   atorvastatin  (LIPITOR) 10 MG tablet TAKE 1 TABLET BY MOUTH EVERY DAY   Calcium  Carb-Cholecalciferol (CALCIUM  600-D PO) Take 1 tablet by mouth daily.   carvedilol  (COREG ) 6.25 MG tablet Take 1 tablet (6.25 mg total) by mouth 2 (two) times daily with a meal.   clindamycin  (CLEOCIN ) 150 MG capsule Take 4 capsules by mouth as needed. (Take one (1) hour prior to dental procedures)   cyanocobalamin  1000 MCG tablet Take 1 tablet (1,000 mcg total) by mouth daily.   furosemide  (LASIX ) 20 MG tablet Take 1 tablet (20 mg total) by mouth daily as needed (wt gain 3 lbs in 24 hrs or 5lbs in 1 week).   glimepiride  (AMARYL ) 2 MG tablet Take 4 mg by mouth daily with breakfast.   GLUCOSAMINE-CHONDROITIN PO Take 1 tablet by mouth daily. Glucosamine 1200 mg, chondroitin 1500 mg   lacosamide  (VIMPAT ) 50 MG TABS tablet Take 1 tablet (50 mg total) by mouth 2 (two) times daily.   metFORMIN  (GLUCOPHAGE -XR) 500 MG 24 hr tablet Take 1,000 mg by mouth in the morning and at bedtime.   Multiple Vitamin (MULTIVITAMIN WITH MINERALS) TABS tablet Take 1 tablet by mouth daily. Centrum Silver   omeprazole (PRILOSEC) 40 MG  capsule Take 40 mg by mouth daily.   ONE TOUCH ULTRA TEST test strip    pramipexole  (MIRAPEX ) 1.5 MG tablet Take 1.5 mg by mouth at bedtime.   sacubitril -valsartan  (ENTRESTO ) 49-51 MG Take 1 tablet by mouth 2 (two) times daily.   spironolactone  (ALDACTONE ) 25 MG tablet Take 0.5 tablets (12.5 mg total) by mouth daily.   thiamine  (VITAMIN B-1) 100 MG tablet Take 1 tablet (100 mg total) by mouth daily.   thiamine  (VITAMIN B1) 100 MG tablet daily.     Allergies:   Atenolol, Lisinopril , Lorazepam , Penicillin g, Penicillins, and Sulfa antibiotics   Social History   Tobacco Use   Smoking status: Never   Smokeless tobacco: Never  Vaping Use   Vaping  status: Never Used  Substance Use Topics   Alcohol  use: No   Drug use: No     Family Hx: The patient's family history includes Arthritis/Rheumatoid in her mother; Cancer in her brother; Diabetes in her mother; Heart attack in her father; Hypertension in her mother; Stroke in her sister.  ROS:   Please see the history of present illness.     All other systems reviewed and are negative.   Labs/Other Tests and Data Reviewed:    Recent Labs: 03/15/2023: ALT 13; TSH 1.086 03/19/2023: B Natriuretic Peptide 114.7; Magnesium  2.0 06/10/2023: NT-Pro BNP 392 08/20/2023: Hemoglobin 11.8; Platelets 180 09/30/2023: BUN 17; Creatinine, Ser 0.82; Potassium 5.1; Sodium 138   Recent Lipid Panel Lab Results  Component Value Date/Time   CHOL 111 03/16/2023 04:59 AM   TRIG 107 03/16/2023 04:59 AM   HDL 59 03/16/2023 04:59 AM   CHOLHDL 1.9 03/16/2023 04:59 AM   LDLCALC 31 03/16/2023 04:59 AM    Wt Readings from Last 3 Encounters:  11/25/23 158 lb 6.4 oz (71.8 kg)  09/28/23 160 lb (72.6 kg)  09/23/23 159 lb 12.8 oz (72.5 kg)     Objective:    Vital Signs:  BP 118/68   Pulse 85   Ht 5' 1 (1.549 m)   Wt 158 lb 6.4 oz (71.8 kg)   SpO2 99%   BMI 29.93 kg/m   GEN: Well nourished, well developed in no acute distress HEENT: Normal NECK: No JVD;  No carotid bruits LYMPHATICS: No lymphadenopathy CARDIAC:RRR, no murmurs, rubs, gallops RESPIRATORY:  Clear to auscultation without rales, wheezing or rhonchi  ABDOMEN: Soft, non-tender, non-distended MUSCULOSKELETAL:  No edema; No deformity  SKIN: Warm and dry NEUROLOGIC:  Alert and oriented x 3 PSYCHIATRIC:  Normal affect  ASSESSMENT & PLAN:   ASCAD  - remote cath showed severe mid LAD stenosis s/p 1 vessel CABG (LIMA to LAD) at the time of her Bentall procedure in 2015.   - Stress PET CT 04/26/2022 showed no ischemia and normal LV function with normal myocardial blood flow reserve - 2D echo 03/27/2023 showed EF 40 to 45% with global HK - Repeat stress PET CT 08/12/2023 showed no ischemia and normal myocardial blood flow reserve with EF 35% - she has not had any symptoms since I saw her last. - continue ASA 81mg  daily, Atorvastatin  10mg  daily, Carvedilol  6.25mg  BID with PRN refills  Hypertension  - BP controlled on exam today - continue Entresto  49-51mg  BID, spiro 12.5mg  daily, Carvedilol  6.25mg  BID with PRN refills - I have personally reviewed and interpreted outside labs performed by patient's PCP which showed serum creatinine 0.82, potassium 5.1 on 09/30/2023  Severe AI  - she is status post Bentall procedure with aortic root replacement in 2015.   -2D echo 11/2023 with stable 23 mm bioprosthetic Magna Ease AVR with mean AVG 13.1 mmHg and no perivalvular leak  Chronic combined systolic /diastolic CHF  NICM SOB -2D echo 03/27/2023 and 11/10/2023 showed EF 40 to 45% -Stress PET CT 03/27/2023 showed EF 35 to 40% no ischemia -CMR 08/25/2023 CMR showed EF 30%, normal RV and no evidence of LGE.  Extracellular volume percentage was elevated suggestive of myocardial fibrotic content.   -Stress PET CT showed no ischemia 08/2023 -she appears euvolemic on exam today -GDMT: continue Carvedilol  6.25mg  BID, Spiro 12.5mg  daily with PRN refills -she takes Lasix  20mg  daily PRN for edema or weight  gain -Increase Entresto  to 97-103mg  BID  -No SGLT2 due to history of yeast infections -  check BMET in 1 week  Hyperlipidemia  -her LDL goal is less than 70.  -I have personally reviewed and interpreted outside labs performed by patient's PCP which showed LDL 31, HDL 59, triglycerides 107 and ALT 13 on 03/15/2023 -continue Atorvastatin  10mg  daily with PRN refills   Bilateral carotid artery stenosis  -Dopplers done 06/2023 showed 1 to 39% bilateral carotid stenosis -Continue aspirin  81 mg daily and statin therapy  Followup: followup with me in 3 months   Medication Adjustments/Labs and Tests Ordered: Current medicines are reviewed at length with the patient today.  Concerns regarding medicines are outlined above.  Tests Ordered: No orders of the defined types were placed in this encounter.  Medication Changes: No orders of the defined types were placed in this encounter.   Disposition:  Follow up in 1 year(s)  Signed, Wilbert Bihari, MD  11/25/2023 10:17 AM    [facility]

## 2023-11-25 NOTE — Addendum Note (Signed)
 Addended by: JANIT GENI CROME on: 11/25/2023 10:33 AM   Modules accepted: Orders

## 2023-11-25 NOTE — Patient Instructions (Signed)
 Medication Instructions:  Please INCREASE Entresto  to 97-103 mg twice a day.  *If you need a refill on your cardiac medications before your next appointment, please call your pharmacy*  Lab Work: Please complete a BMET in one week at any [facility]. You do not need to be fasting.  If you have labs (blood work) drawn today and your tests are completely normal, you will receive your results only by: MyChart Message (if you have MyChart) OR A paper copy in the mail If you have any lab test that is abnormal or we need to change your treatment, we will call you to review the results.  Testing/Procedures: None.  Follow-Up: At [facility], you and your health needs are our priority.  As part of our continuing mission to provide you with exceptional heart care, our providers are all part of one team.  This team includes your primary Cardiologist (physician) and Advanced Practice Providers or APPs (Physician Assistants and Nurse Practitioners) who all work together to provide you with the care you need, when you need it.  Your next appointment:   3 month(s)  Provider:   Wilbert Bihari, MD

## 2023-11-25 NOTE — Addendum Note (Signed)
 Addended by: JANIT GENI CROME on: 11/25/2023 10:36 AM   Modules accepted: Orders

## 2023-12-03 DIAGNOSIS — I5042 Chronic combined systolic (congestive) and diastolic (congestive) heart failure: Secondary | ICD-10-CM | POA: Diagnosis not present

## 2023-12-03 DIAGNOSIS — I10 Essential (primary) hypertension: Secondary | ICD-10-CM | POA: Diagnosis not present

## 2023-12-03 DIAGNOSIS — Z79899 Other long term (current) drug therapy: Secondary | ICD-10-CM | POA: Diagnosis not present

## 2023-12-04 ENCOUNTER — Ambulatory Visit: Payer: Self-pay | Admitting: Cardiology

## 2023-12-04 LAB — BASIC METABOLIC PANEL WITH GFR
BUN/Creatinine Ratio: 18 (ref 12–28)
BUN: 15 mg/dL (ref 8–27)
CO2: 19 mmol/L — ABNORMAL LOW (ref 20–29)
Calcium: 9.2 mg/dL (ref 8.7–10.3)
Chloride: 100 mmol/L (ref 96–106)
Creatinine, Ser: 0.85 mg/dL (ref 0.57–1.00)
Glucose: 171 mg/dL — ABNORMAL HIGH (ref 70–99)
Potassium: 4.8 mmol/L (ref 3.5–5.2)
Sodium: 138 mmol/L (ref 134–144)
eGFR: 69 mL/min/1.73 (ref >59)

## 2023-12-13 ENCOUNTER — Other Ambulatory Visit: Payer: Self-pay | Admitting: Cardiology

## 2023-12-16 NOTE — Telephone Encounter (Signed)
-----   Message from Wilbert Bihari sent at 12/04/2023 12:44 PM EDT ----- Please let patient know that labs were normal.  Continue current medical therapy. ----- Message ----- From: Interface, [facility] Lab Results In Sent: 12/04/2023   3:36 AM EDT To: Wilbert JONELLE Bihari, MD

## 2023-12-16 NOTE — Telephone Encounter (Signed)
 Spoke to patient's spouse Clotilda [facility]) to advise  that labs were normal and to continue current medical therapy. Spouse verbalizes understanding.

## 2024-01-21 ENCOUNTER — Other Ambulatory Visit: Payer: Self-pay | Admitting: Internal Medicine

## 2024-01-21 DIAGNOSIS — Z1231 Encounter for screening mammogram for malignant neoplasm of breast: Secondary | ICD-10-CM

## 2024-01-28 DIAGNOSIS — M5416 Radiculopathy, lumbar region: Secondary | ICD-10-CM | POA: Diagnosis not present

## 2024-02-09 ENCOUNTER — Ambulatory Visit

## 2024-02-10 ENCOUNTER — Telehealth: Payer: Self-pay | Admitting: Cardiology

## 2024-02-10 NOTE — Telephone Encounter (Signed)
 [employer] called in due to pt Tier change in 2026 and current medication needs to be generic please advise   sacubitril -valsartan  (ENTRESTO ) 97-103 MG

## 2024-02-11 DIAGNOSIS — I429 Cardiomyopathy, unspecified: Secondary | ICD-10-CM | POA: Diagnosis not present

## 2024-02-11 DIAGNOSIS — I509 Heart failure, unspecified: Secondary | ICD-10-CM | POA: Diagnosis not present

## 2024-02-11 DIAGNOSIS — I110 Hypertensive heart disease with heart failure: Secondary | ICD-10-CM | POA: Diagnosis not present

## 2024-02-11 DIAGNOSIS — E669 Obesity, unspecified: Secondary | ICD-10-CM | POA: Diagnosis not present

## 2024-02-11 DIAGNOSIS — I700 Atherosclerosis of aorta: Secondary | ICD-10-CM | POA: Diagnosis not present

## 2024-02-11 DIAGNOSIS — E785 Hyperlipidemia, unspecified: Secondary | ICD-10-CM | POA: Diagnosis not present

## 2024-02-11 DIAGNOSIS — R32 Unspecified urinary incontinence: Secondary | ICD-10-CM | POA: Diagnosis not present

## 2024-02-11 DIAGNOSIS — E119 Type 2 diabetes mellitus without complications: Secondary | ICD-10-CM | POA: Diagnosis not present

## 2024-02-11 DIAGNOSIS — G40909 Epilepsy, unspecified, not intractable, without status epilepticus: Secondary | ICD-10-CM | POA: Diagnosis not present

## 2024-02-16 ENCOUNTER — Ambulatory Visit
Admission: RE | Admit: 2024-02-16 | Discharge: 2024-02-16 | Disposition: A | Source: Ambulatory Visit | Attending: Internal Medicine | Admitting: Internal Medicine

## 2024-02-16 DIAGNOSIS — Z1231 Encounter for screening mammogram for malignant neoplasm of breast: Secondary | ICD-10-CM

## 2024-02-29 ENCOUNTER — Ambulatory Visit: Attending: Cardiology | Admitting: Cardiology

## 2024-02-29 ENCOUNTER — Encounter: Payer: Self-pay | Admitting: Cardiology

## 2024-02-29 VITALS — BP 116/56 | HR 76 | Ht 60.0 in | Wt 163.4 lb

## 2024-02-29 DIAGNOSIS — I10 Essential (primary) hypertension: Secondary | ICD-10-CM | POA: Diagnosis not present

## 2024-02-29 DIAGNOSIS — I2510 Atherosclerotic heart disease of native coronary artery without angina pectoris: Secondary | ICD-10-CM | POA: Diagnosis not present

## 2024-02-29 DIAGNOSIS — I351 Nonrheumatic aortic (valve) insufficiency: Secondary | ICD-10-CM

## 2024-02-29 DIAGNOSIS — I6523 Occlusion and stenosis of bilateral carotid arteries: Secondary | ICD-10-CM

## 2024-02-29 DIAGNOSIS — E785 Hyperlipidemia, unspecified: Secondary | ICD-10-CM

## 2024-02-29 DIAGNOSIS — I5032 Chronic diastolic (congestive) heart failure: Secondary | ICD-10-CM | POA: Diagnosis not present

## 2024-02-29 DIAGNOSIS — I420 Dilated cardiomyopathy: Secondary | ICD-10-CM

## 2024-02-29 DIAGNOSIS — Z79899 Other long term (current) drug therapy: Secondary | ICD-10-CM

## 2024-02-29 MED ORDER — SPIRONOLACTONE 25 MG PO TABS: 25.0000 mg | ORAL_TABLET | Freq: Every day | ORAL | 3 refills | Status: AC

## 2024-02-29 NOTE — Addendum Note (Signed)
 Addended by: JANIT GENI CROME on: 02/29/2024 10:31 AM   Modules accepted: Orders

## 2024-02-29 NOTE — Patient Instructions (Signed)
 Medication Instructions:  Please INCREASE spironolactone  to 25 mg daily.   *If you need a refill on your cardiac medications before your next appointment, please call your pharmacy*  Lab Work: Please complete a BMET at any [facility] in one week. You do not need to be fasting.  If you have labs (blood work) drawn today and your tests are completely normal, you will receive your results only by: MyChart Message (if you have MyChart) OR A paper copy in the mail If you have any lab test that is abnormal or we need to change your treatment, we will call you to review the results.  Testing/Procedures: Your physician has requested that you have an echocardiogram in 2 months (Late January/Early February). Echocardiography is a painless test that uses sound waves to create images of your heart. It provides your doctor with information about the size and shape of your heart and how well your heart's chambers and valves are working. This procedure takes approximately one hour. There are no restrictions for this procedure. Please do NOT wear cologne, perfume, aftershave, or lotions (deodorant is allowed). Please arrive 15 minutes prior to your appointment time.  Please note: We ask at that you not bring children with you during ultrasound (echo/ vascular) testing. Due to room size and safety concerns, children are not allowed in the ultrasound rooms during exams. Our front office staff cannot provide observation of children in our lobby area while testing is being conducted. An adult accompanying a patient to their appointment will only be allowed in the ultrasound room at the discretion of the ultrasound technician under special circumstances. We apologize for any inconvenience.   Follow-Up: At [facility], you and your health needs are our priority.  As part of our continuing mission to provide you with exceptional heart care, our providers are all part of one team.  This team includes your  primary Cardiologist (physician) and Advanced Practice Providers or APPs (Physician Assistants and Nurse Practitioners) who all work together to provide you with the care you need, when you need it.  Your next appointment:   6 month(s)  Provider:   Wilbert Bihari, MD    We recommend signing up for the patient portal called MyChart.  Sign up information is provided on this After Visit Summary.  MyChart is used to connect with patients for Virtual Visits (Telemedicine).  Patients are able to view lab/test results, encounter notes, upcoming appointments, etc.  Non-urgent messages can be sent to your provider as well.   To learn more about what you can do with MyChart, go to [email or web address].   Other Instructions Dr. Bihari has referred you to our pharmacist clinic for the next step in managing your heart failure medications. Someone will call you to set up an appointment.

## 2024-02-29 NOTE — Progress Notes (Signed)
 9 Date:  02/29/2024   ID:  [name] [name], DOB [birth year], MRN [id number]   PCP:  Dwight Trula SQUIBB, MD  Cardiologist:  Wilbert Bihari, MD  Electrophysiologist:  None   Chief Complaint:  AI, HTN, DCM, CAD, lipids  History of Present Illness:     [name] [name] is a 78 y.o. female with a hx of PVC's, dilated aortic root, severe AR, HTN, severe LV dysfunction with EF 25% (now normalized), severe mid LAD stenosis s/p 1 vessel CABG (LIMA to LAD) and Bentall procedure with aortic root replacement with pericardial tissue AVR .      2D echo 03/27/2023 showed EF 40 to 45%.  A stress PET CT 08/12/2023 showed normal myocardial perfusion with EF 35% with normal myocardial blood flow reserve.  She was started on Entresto  24-26 mg twice daily and continued on carvedilol .  CMR showed EF 30%, normal RV and no evidence of LGE.  Extracellular volume percentage was elevated suggestive of myocardial fibrotic content.  She is intolerant to SGLT2 due to history of yeast infections.    I last office visit 11/25/2023 we increased her Entresto  to 97-23 mg twice daily for maximum tolerated GDMT.  She is now back for follow-up.  She is here today and is doing well.  She denies any chest pain or pressure.  No significant SOB, DOE, PND, orthopnea, lower extremity edema (except when riding in a car for long distances), dizziness, palpitations or syncope.  Prior CV studies:   The following studies were reviewed today:None   Past Medical History:  Diagnosis Date   Adenomatous colon polyp    Aortic regurgitation    severe by TEE s/p pericardia tissue AVR   Arthritis    Cardiomyopathy (HCC)    EF initally 25% but normalized>>repeat echo 5/20205 with EF 30-35%   Carotid stenosis 08/17/2017   1-39% bilateral carotid stenosis  dopplers 06/2023   Chronic diastolic CHF (congestive heart failure), NYHA class 2 (HCC)    Coronary artery disease 10/05/2013   Severe mid LAD stenosis s/p 1 vessel CABG with LIMA to LAD   Diabetes  mellitus without complication (HCC)    DX 20 YRS AGO.   TYPE 2   Dilated aortic root    GERD (gastroesophageal reflux disease)    Tums prn   Gout    Hypercholesteremia    LDL goal < 70   Hypertension    S/P Bio-Bentall aortic root replacement with bioprosthetic valve conduit 10/27/2013   23 mm [facility] Ease bovine pericardial tissue valve and 26 mm Vascutek gelweave Val-Salva aortic graft   S/P CABG x 1 10/27/2013   LIMA to LAD   Thyroiditis 10/2009   lab and u/s-thyroid  function normalized in 9/11   Past Surgical History:  Procedure Laterality Date   ABDOMINAL HYSTERECTOMY     AORTIC VALVE REPLACEMENT N/A 10/27/2013   Procedure: AORTIC VALVE REPLACEMENT (AVR);  Surgeon: Sudie VEAR Laine, MD;  Location: [facility] OR;  Service: Open Heart Surgery;  Laterality: N/A;   ASCENDING AORTIC ROOT REPLACEMENT N/A 10/27/2013   Procedure: ASCENDING AORTIC ROOT REPLACEMENT;  Surgeon: Sudie VEAR Laine, MD;  Location: [facility] OR;  Service: Open Heart Surgery;  Laterality: N/A;   BUNIONECTOMY     bilateral   CARDIAC CATHETERIZATION     CARDIAC VALVE REPLACEMENT     AORTIC VALVE   CARPAL TUNNEL RELEASE Right    CATARACT EXTRACTION Bilateral    COLONOSCOPY     COLONOSCOPY WITH PROPOFOL  N/A 05/08/2015  Procedure: COLONOSCOPY WITH PROPOFOL ;  Surgeon: Gladis MARLA Louder, MD;  Location: [facility] ENDOSCOPY;  Service: Endoscopy;  Laterality: N/A;   CORONARY ARTERY BYPASS GRAFT N/A 10/27/2013   Procedure: CORONARY ARTERY BYPASS GRAFTING (CABG) x 1 - LIMA to LAD;  Surgeon: Sudie VEAR Laine, MD;  Location: [facility] OR;  Service: Open Heart Surgery;  Laterality: N/A;   EYE SURGERY     BILATERAL CATARACTS   INTRAOPERATIVE TRANSESOPHAGEAL ECHOCARDIOGRAM N/A 10/27/2013   Procedure: INTRAOPERATIVE TRANSESOPHAGEAL ECHOCARDIOGRAM;  Surgeon: Sudie VEAR Laine, MD;  Location: [facility] OR;  Service: Open Heart Surgery;  Laterality: N/A;   JOINT REPLACEMENT     left total knee   KNEE ARTHROPLASTY Right 01/26/2017   Procedure: RIGHT TOTAL KNEE  ARTHROPLASTY WITH COMPUTER NAVIGATION;  Surgeon: Fidel Rogue, MD;  Location: [facility] OR;  Service: Orthopedics;  Laterality: Right;  Needs RNFA   KNEE ARTHROPLASTY Left 05/28/2017   Procedure: LEFT TOTAL KNEE ARTHROPLASTY WITH COMPUTER NAVIGATION;  Surgeon: Fidel Rogue, MD;  Location: [facility] ORS;  Service: Orthopedics;  Laterality: Left;  Needs RNFA   LEFT AND RIGHT HEART CATHETERIZATION WITH CORONARY ANGIOGRAM N/A 10/05/2013   Procedure: LEFT AND RIGHT HEART CATHETERIZATION WITH CORONARY ANGIOGRAM;  Surgeon: Ozell JONETTA Fell, MD;  Location: [facility] CATH LAB;  Service: Cardiovascular;  Laterality: N/A;   TEE WITHOUT CARDIOVERSION N/A 10/04/2013   Procedure: TRANSESOPHAGEAL ECHOCARDIOGRAM (TEE);  Surgeon: Wilbert JONELLE Bihari, MD;  Location: [facility] ENDOSCOPY;  Service: Cardiovascular;  Laterality: N/A;   tubular adenomatous polyp colonoscopy  12/31/2009     Current Meds  Medication Sig   Apoaequorin (PREVAGEN) 10 MG CAPS Take 1 tablet by mouth daily.   aspirin  81 MG chewable tablet Chew 1 tablet (81 mg total) by mouth 2 (two) times daily.   atorvastatin  (LIPITOR) 10 MG tablet TAKE 1 TABLET BY MOUTH EVERY DAY   Calcium  Carb-Cholecalciferol (CALCIUM  600-D PO) Take 1 tablet by mouth daily.   carvedilol  (COREG ) 6.25 MG tablet Take 1 tablet (6.25 mg total) by mouth 2 (two) times daily with a meal.   clindamycin  (CLEOCIN ) 150 MG capsule Take 4 capsules by mouth as needed. (Take one (1) hour prior to dental procedures)   cyanocobalamin  1000 MCG tablet Take 1 tablet (1,000 mcg total) by mouth daily.   furosemide  (LASIX ) 20 MG tablet Take 1 tablet (20 mg total) by mouth daily as needed (wt gain 3 lbs in 24 hrs or 5lbs in 1 week).   glimepiride  (AMARYL ) 2 MG tablet Take 4 mg by mouth daily with breakfast.   GLUCOSAMINE-CHONDROITIN PO Take 1 tablet by mouth daily. Glucosamine 1200 mg, chondroitin 1500 mg   lacosamide  (VIMPAT ) 50 MG TABS tablet Take 1 tablet (50 mg total) by mouth 2 (two) times daily.   metFORMIN   (GLUCOPHAGE -XR) 500 MG 24 hr tablet Take 1,000 mg by mouth in the morning and at bedtime.   Multiple Vitamin (MULTIVITAMIN WITH MINERALS) TABS tablet Take 1 tablet by mouth daily. Centrum Silver   omeprazole (PRILOSEC) 40 MG capsule Take 40 mg by mouth daily.   ONE TOUCH ULTRA TEST test strip    pramipexole  (MIRAPEX ) 1.5 MG tablet Take 1.5 mg by mouth at bedtime.   sacubitril -valsartan  (ENTRESTO ) 97-103 MG Take 1 tablet by mouth 2 (two) times daily.   spironolactone  (ALDACTONE ) 25 MG tablet Take 0.5 tablets (12.5 mg total) by mouth daily.   thiamine  (VITAMIN B-1) 100 MG tablet Take 1 tablet (100 mg total) by mouth daily.   thiamine  (VITAMIN B1) 100 MG tablet daily.  Allergies:   Atenolol, Lisinopril , Lorazepam , Penicillin g, Penicillins, and Sulfa antibiotics   Social History   Tobacco Use   Smoking status: Never   Smokeless tobacco: Never  Vaping Use   Vaping status: Never Used  Substance Use Topics   Alcohol  use: No   Drug use: No     Family Hx: The patient's family history includes Arthritis/Rheumatoid in her mother; Cancer in her brother; Diabetes in her mother; Heart attack in her father; Hypertension in her mother; Stroke in her sister.  ROS:   Please see the history of present illness.     All other systems reviewed and are negative.   Labs/Other Tests and Data Reviewed:    Recent Labs: 03/15/2023: ALT 13; TSH 1.086 03/19/2023: B Natriuretic Peptide 114.7; Magnesium  2.0 06/10/2023: NT-Pro BNP 392 08/20/2023: Hemoglobin 11.8; Platelets 180 12/03/2023: BUN 15; Creatinine, Ser 0.85; Potassium 4.8; Sodium 138   Recent Lipid Panel Lab Results  Component Value Date/Time   CHOL 111 03/16/2023 04:59 AM   TRIG 107 03/16/2023 04:59 AM   HDL 59 03/16/2023 04:59 AM   CHOLHDL 1.9 03/16/2023 04:59 AM   LDLCALC 31 03/16/2023 04:59 AM    Wt Readings from Last 3 Encounters:  02/29/24 163 lb 6.4 oz (74.1 kg)  11/25/23 158 lb 6.4 oz (71.8 kg)  09/28/23 160 lb (72.6 kg)      Objective:    Vital Signs:  BP (!) 116/56   Pulse 76   Ht 5' (1.524 m)   Wt 163 lb 6.4 oz (74.1 kg)   SpO2 96%   BMI 31.91 kg/m   GEN: Well nourished, well developed in no acute distress HEENT: Normal NECK: No JVD; No carotid bruits LYMPHATICS: No lymphadenopathy CARDIAC:RRR, no murmurs, rubs, gallops RESPIRATORY:  Clear to auscultation without rales, wheezing or rhonchi  ABDOMEN: Soft, non-tender, non-distended MUSCULOSKELETAL:  trace RLE edema; No deformity  SKIN: Warm and dry NEUROLOGIC:  Alert and oriented x 3 PSYCHIATRIC:  Normal affect   ASSESSMENT & PLAN:   ASCAD  - remote cath showed severe mid LAD stenosis s/p 1 vessel CABG (LIMA to LAD) at the time of her Bentall procedure in 2015.   - Stress PET CT 04/26/2022 showed no ischemia and normal LV function with normal myocardial blood flow reserve - 2D echo 03/27/2023 showed EF 40 to 45% with global HK - Repeat stress PET CT 08/12/2023 showed no ischemia and normal myocardial blood flow reserve with EF 35% - She denies any anginal symptoms - Continue aspirin  81 mg daily, atorvastatin  10 mg daily, carvedilol  6.25 mg twice daily  Hypertension  - BP controlled at 116/56mmHg - Continue Entresto  97-23 mg BID and carvedilol  6.25 mg twice daily - Increase spironolactone  to 25 mg daily for GDMT - I have personally reviewed and interpreted outside labs performed by patient's PCP which showed SCr 0.85 and potassium 4.8 on 12/03/2023  Severe AI  - she is status post Bentall procedure with aortic root replacement in 2015.   -2D echo 11/2023 with stable 23 mm bioprosthetic Magna Ease AVR with mean AVG 13.1 mmHg and no perivalvular leak  Chronic combined systolic /diastolic CHF  NICM SOB -2D echo 03/27/2023 and 11/10/2023 showed EF 40 to 45% -Stress PET CT 03/27/2023 showed EF 35 to 40% no ischemia -CMR 08/25/2023 CMR showed EF 30%, normal RV and no evidence of LGE.  Extracellular volume percentage was elevated suggestive of  myocardial fibrotic content.   -Stress PET CT showed no ischemia 08/2023 -She appears euvolemic  on exam today>>has some trace LE edema related to long car ride yesterday -GDMT: continue Carvedilol  6.25mg  BID and Entresto  97-23 mg twice daily -Increase spironolactone  to 25 mg daily -she takes Lasix  20mg  daily PRN for edema or weight gain -No SGLT2 due to history of yeast infections -Repeat bmet in 1 week -Will have her follow-up with Pharm.D. in 2 weeks to see if we can increase carvedilol  further -Repeat 2D echo in 2 months to see if EF improves on maximum tolerated GDMT  Hyperlipidemia  -her LDL goal is less than 70.  -Check FLP and ALT -Continue atorvastatin  10 mg daily   Bilateral carotid artery stenosis  -Dopplers done 06/2023 showed 1 to 39% bilateral carotid stenosis -Continue aspirin  81 mg daily and statin therapy  Followup: followup with me in 6 months   Medication Adjustments/Labs and Tests Ordered: Current medicines are reviewed at length with the patient today.  Concerns regarding medicines are outlined above.  Tests Ordered: Orders Placed This Encounter  Procedures   EKG 12-Lead   Medication Changes: No orders of the defined types were placed in this encounter.   Disposition:  Follow up in 1 year(s)  Signed, Wilbert Bihari, MD  02/29/2024 10:03 AM    [facility]

## 2024-03-02 DIAGNOSIS — R051 Acute cough: Secondary | ICD-10-CM | POA: Diagnosis not present

## 2024-03-10 ENCOUNTER — Ambulatory Visit: Payer: Self-pay | Admitting: Cardiology

## 2024-03-10 DIAGNOSIS — I351 Nonrheumatic aortic (valve) insufficiency: Secondary | ICD-10-CM

## 2024-03-10 LAB — BASIC METABOLIC PANEL WITH GFR
BUN/Creatinine Ratio: 18 (ref 12–28)
BUN: 14 mg/dL (ref 8–27)
CO2: 20 mmol/L (ref 20–29)
Calcium: 8.8 mg/dL (ref 8.7–10.3)
Chloride: 100 mmol/L (ref 96–106)
Creatinine, Ser: 0.76 mg/dL (ref 0.57–1.00)
Glucose: 227 mg/dL — ABNORMAL HIGH (ref 70–99)
Potassium: 4.8 mmol/L (ref 3.5–5.2)
Sodium: 137 mmol/L (ref 134–144)
eGFR: 79 mL/min/1.73 (ref >59)

## 2024-03-11 DIAGNOSIS — M5416 Radiculopathy, lumbar region: Secondary | ICD-10-CM | POA: Diagnosis not present

## 2024-04-18 ENCOUNTER — Ambulatory Visit: Attending: Cardiology

## 2024-04-18 VITALS — BP 120/71 | HR 62

## 2024-04-18 DIAGNOSIS — I5032 Chronic diastolic (congestive) heart failure: Secondary | ICD-10-CM

## 2024-04-18 NOTE — Progress Notes (Signed)
 Patient ID: [name] [name]                 DOB: May 03, 1945                      MRN: [id number]     HPI: [name] [name] is a 79 y.o. female referred by Dr. Shlomo to pharmacy clinic for HF medication management. PMH is significant for HFrEF, CAD s/p 1 vessel CABG (LIMA to LAD), HLD, DCM, and HTN. Most recent LVEF 40-45% on 11/2023.  The patient was last seen by Dr. Shlomo in November 2025 for a follow-up visit. At that time, she was doing well. She denied chest pain, pressure, significant shortness of breath, dyspnea on exertion, paroxysmal nocturnal dyspnea, orthopnea, dizziness, palpitations, or syncope. No lower extremity edema was noted except after prolonged car rides. Blood pressure was 116/56. Spironolactone  was increased to 25 mg daily, and she was referred to the pharmacist to assess whether carvedilol  could be further titrated. A repeat BMP remained stable after the spironolactone  increase. She was advised to continue carvedilol  6.25 mg twice daily and Entresto  97/23 mg twice daily. SGLT2i not initiated due to a history of recurrent yeast infections.  Patient presents today accompanied by her husband. She is unsure what this visit is for. She is curently taking carvedilol  6.25 mg twice daily, Entresto  97-103 twice daily, spironolactone  25 mg daily. Report no issues toelrating medications well. She doesn't check her blood pressure religiously everyday but she when checks it it is usually 124-125/ 80-85. She has an Echo scheduled at the end of the month.   Discussion with patient today included the following: cardiac medication indications, introduction to GDMT clinic, reasoning behind medication titration, importance of medication adherence, and patient engagement. Symptomatically, she is feeling well, no dizziness, lightheadedness, but some fatigue. No chest pain or palpitations. No SOB. Able to complete all ADLs. Activity level normal. She does not checks her weight at home. I encouraged her  to check weight daily. Denies LEE, PND, or orthopnea. Appetite has been normal. She good adheres to a low-salt diet, though eats out majority of the time.  Current CHF meds: carvedilol  6.25 mg twice daily, Entresto  97-103 twice daily, spironolactone  25 mg daily Previously tried: SGLT2i (yeast infections)  Adherence Assessment  Do you ever forget to take your medication? [x] Yes, occasionally [] No  Do you ever skip doses due to side effects? [x] Yes [] No  Do you have trouble affording your medicines? [] Yes [x] No  Are you ever unable to pick up your medication due to transportation difficulties? [] Yes [x] No  Do you ever stop taking your medications because you don't believe they are helping? [] Yes [x] No  Do you check your weight daily? [] Yes [x] No   Adherence strategy: pt remembers; provided her with a pill box  Barriers to obtaining medications: none  BP goal: < 130/80  Family History:  Relation Problem Comments  Mother (Deceased) Arthritis/Rheumatoid   Diabetes   Hypertension     Father (Deceased) Heart attack     Sister (Deceased) Stroke     Brother (Deceased) Cancer     Maternal Grandmother (Deceased)   Maternal Grandfather (Deceased)   Paternal Grandmother (Deceased)   Paternal Grandfather (Deceased)    Social History:  Alcohol : none  Smoking: none   Diet: Breakfast: Cereal, occasional muffins, and 1 cup of black decaf coffee. Lunch/Dinner: Sandwich or biscuit; dinner includes vegetables and protein. Snacks: Chips or candy. Beverages: Caffeine-free sodas, water , and tea.  Exercise:  She and her husband walk 5 days per week ~ 40 minutes   Home BP readings: No BP log provided; patient reports usually ranging 124-125/ 80-85; HR 80  Wt Readings from Last 3 Encounters:  02/29/24 163 lb 6.4 oz (74.1 kg)  11/25/23 158 lb 6.4 oz (71.8 kg)  09/28/23 160 lb (72.6 kg)   BP Readings from Last 3 Encounters:  04/18/24 120/71  02/29/24 (!) 116/56  11/25/23 118/68    Pulse Readings from Last 3 Encounters:  04/18/24 62  02/29/24 76  11/25/23 85    Renal function: CrCl cannot be calculated (Patient's most recent lab result is older than the maximum 21 days allowed.).  Past Medical History:  Diagnosis Date   Adenomatous colon polyp    Aortic regurgitation    severe by TEE s/p pericardia tissue AVR   Arthritis    Cardiomyopathy (HCC)    EF initally 25% but normalized>>repeat echo 5/20205 with EF 30-35%   Carotid stenosis 08/17/2017   1-39% bilateral carotid stenosis  dopplers 06/2023   Chronic diastolic CHF (congestive heart failure), NYHA class 2 (HCC)    Coronary artery disease 10/05/2013   Severe mid LAD stenosis s/p 1 vessel CABG with LIMA to LAD   Diabetes mellitus without complication (HCC)    DX 20 YRS AGO.   TYPE 2   Dilated aortic root    GERD (gastroesophageal reflux disease)    Tums prn   Gout    Hypercholesteremia    LDL goal < 70   Hypertension    S/P Bio-Bentall aortic root replacement with bioprosthetic valve conduit 10/27/2013   23 mm [facility] Ease bovine pericardial tissue valve and 26 mm Vascutek gelweave Val-Salva aortic graft   S/P CABG x 1 10/27/2013   LIMA to LAD   Thyroiditis 10/2009   lab and u/s-thyroid  function normalized in 9/11    Medications Ordered Prior to Encounter[1]  Allergies[2]   Assessment/Plan:  1. CHF -  Chronic diastolic CHF (congestive heart failure), NYHA class 2 (HCC) Assessment & Plan: Assessment: BP is controlled in office BP 120/ 71; HR 62 mmHg below the goal (<130/80). Patient reports home BP ranges124-125/ 80-85; HR 80 Tolerates current regimen well without any side effects; Currently on BB, MRA and ARNi. No SGLT2i due to hx of yeast infection Will hold off on increasing BB at this time as heart rate today at lower end of normal Denies SOB, palpitation, chest pain, headaches,or swelling Reiterated the importance of regular exercise and low salt diet   Plan:  Continue  taking carvedilol  6.25 mg twice daily, Entresto  97-103 twice daily, and spironolactone  25 mg daily Patient to keep record of BP readings with heart rate and report to us  at the next visit Will consider BB titration at next visit if blood pressure and heart rate allow Instructed patient to bring BP monitor to confirm accuracy Patient to see PharmD in 5-6 weeks for follow up      Thank you   Clancy Mullarkey E. Malayah Demuro, Pharm.D, CPP [place] [name] [name]. [facility] [address], [place], [place] [address] Phone: [phone]; Fax: [phone]      [1]  Current Outpatient Medications on File Prior to Visit  Medication Sig Dispense Refill   aspirin  81 MG chewable tablet Chew 1 tablet (81 mg total) by mouth 2 (two) times daily. 60 tablet 1   atorvastatin  (LIPITOR) 10 MG tablet TAKE 1 TABLET BY MOUTH EVERY DAY 90 tablet  3   Calcium  Carb-Cholecalciferol (CALCIUM  600-D PO) Take 1 tablet by mouth daily.     carvedilol  (COREG ) 6.25 MG tablet Take 1 tablet (6.25 mg total) by mouth 2 (two) times daily with a meal. 180 tablet 3   cyanocobalamin  1000 MCG tablet Take 1 tablet (1,000 mcg total) by mouth daily.     furosemide  (LASIX ) 20 MG tablet Take 1 tablet (20 mg total) by mouth daily as needed (wt gain 3 lbs in 24 hrs or 5lbs in 1 week). 30 tablet 11   glimepiride  (AMARYL ) 2 MG tablet Take 4 mg by mouth daily with breakfast.     GLUCOSAMINE-CHONDROITIN PO Take 1 tablet by mouth daily. Glucosamine 1200 mg, chondroitin 1500 mg     lacosamide  (VIMPAT ) 50 MG TABS tablet Take 1 tablet (50 mg total) by mouth 2 (two) times daily. 180 tablet 3   metFORMIN  (GLUCOPHAGE -XR) 500 MG 24 hr tablet Take 1,000 mg by mouth in the morning and at bedtime.     Multiple Vitamin (MULTIVITAMIN WITH MINERALS) TABS tablet Take 1 tablet by mouth daily. Centrum Silver     omeprazole (PRILOSEC) 40 MG capsule Take 40 mg by mouth daily.     ONE TOUCH ULTRA TEST test strip      pramipexole  (MIRAPEX )  1.5 MG tablet Take 1.5 mg by mouth at bedtime.     sacubitril -valsartan  (ENTRESTO ) 97-103 MG Take 1 tablet by mouth 2 (two) times daily. 180 tablet 3   spironolactone  (ALDACTONE ) 25 MG tablet Take 1 tablet (25 mg total) by mouth daily. 90 tablet 3   thiamine  (VITAMIN B-1) 100 MG tablet Take 1 tablet (100 mg total) by mouth daily.     No current facility-administered medications on file prior to visit.  [2]  Allergies Allergen Reactions   Atenolol Shortness Of Breath and Other (See Comments)    Edema and Headache   Lisinopril  Cough   Lorazepam  Other (See Comments)    Over sedation. Family does not want lorazepam .    Penicillin G Other (See Comments)    Other Reaction(s): Not available   Penicillins Other (See Comments) and Rash    Has patient had a PCN reaction causing immediate rash, facial/tongue/throat swelling, SOB or lightheadedness with hypotension: No  Has patient had a PCN reaction causing severe rash involving mucus membranes or skin necrosis: No  Has patient had a PCN reaction that required hospitalization No  Has patient had a PCN reaction occurring within the last 10 years: No  If all of the above answers are NO, then may proceed with Cephalosporin use.G Benzathine: Local injection rash  Other Reaction(s): Not available   Sulfa Antibiotics Hives and Rash

## 2024-04-18 NOTE — Patient Instructions (Addendum)
 Changes made by your pharmacist Kyley Laurel E. Margareta Laureano, PharmD, CPP at today's visit:    Instructions/Changes  (what do you need to do) Your Notes  (what you did and when you did it)  Continue carvedilol  6.25 mg twice daily, Entresto  97-103 twice daily, spironolactone  25 mg daily   2. Check blood pressure consistently once      daily    3. PharmD appt 06/01/2024 at 1:45 PM    Bring all of your meds, your BP cuff and your record of home blood pressures to your next appointment.   Faryal Marxen E. Croix Presley, Pharm.D, CPP [place] [facility]. [facility] [address], [place], [place] [address] Phone: [phone]; Fax: [phone]     HOW TO TAKE YOUR BLOOD PRESSURE AT HOME  Rest 5 minutes before taking your blood pressure.  Dont smoke or drink caffeinated beverages for at least 30 minutes before. Take your blood pressure before (not after) you eat. Sit comfortably with your back supported and both feet on the floor (dont cross your legs). Elevate your arm to heart level on a table or a desk. Use the proper sized cuff. It should fit smoothly and snugly around your bare upper arm. There should be enough room to slip a fingertip under the cuff. The bottom edge of the cuff should be 1 inch above the crease of the elbow. Ideally, take 3 measurements at one sitting and record the average.  Important lifestyle changes to control high blood pressure  Intervention  Effect on the BP  Lose extra pounds and watch your waistline Weight loss is one of the most effective lifestyle changes for controlling blood pressure. If you're overweight or obese, losing even a small amount of weight can help reduce blood pressure. Blood pressure might go down by about 1 millimeter of mercury (mm Hg) with each kilogram (about 2.2 pounds) of weight lost.  Exercise regularly As a general goal, aim for at least 30 minutes of moderate physical activity every day. Regular physical activity can  lower high blood pressure by about 5 to 8 mm Hg.  Eat a healthy diet Eating a diet rich in whole grains, fruits, vegetables, and low-fat dairy products and low in saturated fat and cholesterol. A healthy diet can lower high blood pressure by up to 11 mm Hg.  Reduce salt (sodium) in your diet Even a small reduction of sodium in the diet can improve heart health and reduce high blood pressure by about 5 to 6 mm Hg.  Limit alcohol  One drink equals 12 ounces of beer, 5 ounces of wine, or 1.5 ounces of 80-proof liquor.  Limiting alcohol  to less than one drink a day for women or two drinks a day for men can help lower blood pressure by about 4 mm Hg.   If you have any questions or concerns please use My Chart to send questions or call the office at [phone]

## 2024-04-18 NOTE — Assessment & Plan Note (Addendum)
 Assessment: BP is controlled in office BP 120/ 71; HR 62 mmHg below the goal (<130/80). Patient reports home BP ranges124-125/ 80-85; HR 80 Tolerates current regimen well without any side effects; Currently on BB, MRA and ARNi. No SGLT2i due to hx of yeast infection Will hold off on increasing BB at this time as heart rate today at lower end of normal Denies SOB, palpitation, chest pain, headaches,or swelling Reiterated the importance of regular exercise and low salt diet   Plan:  Continue taking carvedilol  6.25 mg twice daily, Entresto  97-103 twice daily, and spironolactone  25 mg daily Patient to keep record of BP readings with heart rate and report to us  at the next visit Will consider BB titration at next visit if blood pressure and heart rate allow Instructed patient to bring BP monitor to confirm accuracy Patient to see PharmD in 5-6 weeks for follow up

## 2024-05-05 ENCOUNTER — Ambulatory Visit (HOSPITAL_COMMUNITY)
Admission: RE | Admit: 2024-05-05 | Discharge: 2024-05-05 | Disposition: A | Discharge status: Home / Self Care | Source: Ambulatory Visit | Attending: Cardiology | Admitting: Cardiology

## 2024-05-05 DIAGNOSIS — I420 Dilated cardiomyopathy: Secondary | ICD-10-CM | POA: Insufficient documentation

## 2024-05-05 LAB — ECHOCARDIOGRAM COMPLETE
AR max vel: 0.9 cm2
AV Area VTI: 0.99 cm2
AV Area mean vel: 0.9 cm2
AV Mean grad: 16 mmHg
AV Peak grad: 25.6 mmHg
Ao pk vel: 2.53 m/s
Area-P 1/2: 3.02 cm2
S' Lateral: 3.9 cm

## 2024-05-06 ENCOUNTER — Telehealth: Payer: Self-pay | Admitting: Cardiology

## 2024-05-06 NOTE — Telephone Encounter (Signed)
 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 Please advise.

## 2024-05-10 ENCOUNTER — Encounter (HOSPITAL_COMMUNITY): Payer: Self-pay

## 2024-05-10 NOTE — Telephone Encounter (Signed)
-----   Message from Wilbert Bihari, MD sent at 05/09/2024 12:55 PM EST ----- Echo showed low normal heart function with EF 50 to 55% with mild enlargement of the left ventricle which is the main pumping chamber of the heart.  There is mildly thickened heart muscle.  There is  increase stiffness of the heart muscle called diastolic dysfunction.  Stable aortic valve replacement.  Mean gradient is increased some at 16 mm marked with a DVI that is low at 0.3 and AT less than  100 suggestive of possible patient prosthesis mismatch.  No significant aortic stenosis.  Compared to prior study there is mild improvement in her heart function.  Gradients across the aortic valve  are stable.  Repeat 2D echo in 1 year

## 2024-05-10 NOTE — Telephone Encounter (Signed)
 Call to patient to discuss echo results. Patient verbalizes understanding of:    Echo showed low normal heart function with EF 50 to 55% with mild enlargement of the left ventricle which is the main pumping chamber of the heart.  There is mildly thickened heart muscle.  There is  increase stiffness of the heart muscle called diastolic dysfunction.  Stable aortic valve replacement.  Mean gradient is increased some at 16 mm marked with a DVI that is low at 0.3 and AT less than  100 suggestive of possible patient prosthesis mismatch.  No significant aortic stenosis.  Compared to prior study there is mild improvement in her heart function.  Gradients across the aortic valve  are stable.     Patient agrees to repeat echo in one year.

## 2024-05-10 NOTE — Telephone Encounter (Signed)
 Called 2x to discuss echo results with patient, no answer. No VM picked up.

## 2024-05-13 NOTE — Telephone Encounter (Signed)
 Patient is returning call

## 2024-06-01 ENCOUNTER — Ambulatory Visit

## 2024-09-27 ENCOUNTER — Ambulatory Visit: Admitting: Neurology

## 2024-11-08 ENCOUNTER — Ambulatory Visit: Admitting: Neurology
